# Patient Record
Sex: Male | Born: 1960
Health system: Southern US, Community
[De-identification: ages and names within clinical notes are randomized; demographics above are authoritative.]

## PROBLEM LIST (undated history)

## (undated) DIAGNOSIS — I10 Essential (primary) hypertension: Secondary | ICD-10-CM

## (undated) DIAGNOSIS — J449 Chronic obstructive pulmonary disease, unspecified: Secondary | ICD-10-CM

## (undated) DIAGNOSIS — K219 Gastro-esophageal reflux disease without esophagitis: Secondary | ICD-10-CM

## (undated) DIAGNOSIS — E785 Hyperlipidemia, unspecified: Secondary | ICD-10-CM

## (undated) HISTORY — DX: Hyperlipidemia, unspecified: E78.5

## (undated) HISTORY — DX: Essential (primary) hypertension: I10

## (undated) HISTORY — DX: Gastro-esophageal reflux disease without esophagitis: K21.9

## (undated) HISTORY — DX: Chronic obstructive pulmonary disease, unspecified: J44.9

## (undated) HISTORY — PX: POLYPECTOMY: SHX149

## (undated) HISTORY — PX: APPENDECTOMY: SHX54

---

## 1999-09-28 ENCOUNTER — Emergency Department (HOSPITAL_COMMUNITY): Admission: EM | Admit: 1999-09-28 | Discharge: 1999-09-28 | Payer: Self-pay | Admitting: Emergency Medicine

## 2010-05-24 ENCOUNTER — Encounter: Payer: Self-pay | Admitting: Orthopedic Surgery

## 2011-05-12 ENCOUNTER — Encounter: Payer: Self-pay | Admitting: Internal Medicine

## 2011-05-12 ENCOUNTER — Ambulatory Visit (INDEPENDENT_AMBULATORY_CARE_PROVIDER_SITE_OTHER): Payer: 59 | Admitting: Internal Medicine

## 2011-05-12 DIAGNOSIS — Z0000 Encounter for general adult medical examination without abnormal findings: Secondary | ICD-10-CM

## 2011-05-12 DIAGNOSIS — Z23 Encounter for immunization: Secondary | ICD-10-CM

## 2011-05-12 NOTE — Assessment & Plan Note (Addendum)
Td > 10 years, gave one today Declined flu shot, benefits discussed  Tobacco-- risk discussed, ?patch, meds?, knows that counseling is available. Also needs to see a dentist for oral cancer screening Strongly declined a DRE, benefits explained. Colon cancer screening discussed, colonoscopy versus iFOB; an iFOB  was provided. We'll call if interested in a colonoscopy RTC fast for labs  Noted to be slightly tachycardic, is quite nervous about "going to the doctor"

## 2011-05-12 NOTE — Patient Instructions (Signed)
Please come back fasting within 2 days for blood work only: CBC  CMP  FLP  TSH  PSA ---dx v70

## 2011-05-12 NOTE — Progress Notes (Signed)
  Subjective:    Patient ID: Richard Gates, male    DOB: 1960-09-11, 51 y.o.   MRN: [id number]  HPI New patient, needs a CPX Has not seen a doctor in years  Past medical history No active medical problems    Past surgical history Appendectomy as a child  Social history Married, children no Occupation-- Estate agent Tobacco-- "cutting down", used to be 1 ppd, now 2-3 cigarrets a day ETOH-- socially  Diet-- regular  Exercise -- active at work, basketball in the weekends    Family history Diabetes-- aunts? CAD-- no Stroke-- no Colon cancer-- no Breast cancer--sister  Prostate cancer--no   Review of Systems In general doing well, has occasionally right shoulder pain for the last 2 months, approximately twice a week, described as mild, decreased with certain positions. Also has noted that he sweats a lot when eating, no runny nose. Denies any fever chills or weight loss No abdominal pain, nausea, vomiting, diarrhea or blood in the stools. No dysuria or gross hematuria. No difficulty urinating. No chest pain, shortness of breath. Mild occasional cough with no hemoptysis.    Objective:   Physical Exam  Constitutional: He is oriented to person, place, and time. He appears well-developed and well-nourished. No distress.  HENT:  Head: Normocephalic and atraumatic.  Neck: No thyromegaly present.       Normal carotid pulse  Cardiovascular:       Slightly tachycardic, otherwise normal  Pulmonary/Chest: Effort normal. No respiratory distress. He has no wheezes. He has no rales.  Abdominal: Soft. Bowel sounds are normal. He exhibits no distension. There is no tenderness. There is no rebound and no guarding.  Genitourinary:       Strongly decline a DRE  Musculoskeletal: He exhibits no edema.       Shoulders range of motion normal  Lymphadenopathy:    He has no cervical adenopathy.  Neurological: He is alert and oriented to person, place, and time.  Skin: He is not  diaphoretic.  Psychiatric: He has a normal mood and affect. His behavior is normal. Judgment and thought content normal.       Assessment & Plan:

## 2011-05-13 ENCOUNTER — Encounter: Payer: Self-pay | Admitting: *Deleted

## 2011-05-13 ENCOUNTER — Encounter: Payer: Self-pay | Admitting: Internal Medicine

## 2011-05-16 ENCOUNTER — Other Ambulatory Visit: Payer: Self-pay | Admitting: Internal Medicine

## 2011-05-16 DIAGNOSIS — Z0000 Encounter for general adult medical examination without abnormal findings: Secondary | ICD-10-CM

## 2011-05-17 ENCOUNTER — Other Ambulatory Visit (INDEPENDENT_AMBULATORY_CARE_PROVIDER_SITE_OTHER): Payer: 59

## 2011-05-17 DIAGNOSIS — Z0000 Encounter for general adult medical examination without abnormal findings: Secondary | ICD-10-CM

## 2011-05-17 LAB — CBC WITH DIFFERENTIAL/PLATELET
Basophils Absolute: 0.1 10*3/uL (ref 0.0–0.1)
HCT: 42.1 % (ref 39.0–52.0)
Lymphs Abs: 3.1 10*3/uL (ref 0.7–4.0)
MCV: 96.9 fl (ref 78.0–100.0)
Monocytes Absolute: 0.9 10*3/uL (ref 0.1–1.0)
Neutrophils Relative %: 51.1 % (ref 43.0–77.0)
Platelets: 315 10*3/uL (ref 150.0–400.0)
RDW: 14.1 % (ref 11.5–14.6)
WBC: 8.7 10*3/uL (ref 4.5–10.5)

## 2011-05-17 LAB — LIPID PANEL
LDL Cholesterol: 91 mg/dL (ref 0–99)
Total CHOL/HDL Ratio: 3
VLDL: 17.4 mg/dL (ref 0.0–40.0)

## 2011-05-17 LAB — COMPREHENSIVE METABOLIC PANEL
ALT: 16 U/L (ref 0–53)
AST: 20 U/L (ref 0–37)
Albumin: 4.1 g/dL (ref 3.5–5.2)
Alkaline Phosphatase: 57 U/L (ref 39–117)
Potassium: 4.3 mEq/L (ref 3.5–5.1)
Sodium: 137 mEq/L (ref 135–145)
Total Bilirubin: 0.8 mg/dL (ref 0.3–1.2)
Total Protein: 7 g/dL (ref 6.0–8.3)

## 2011-05-17 LAB — TSH: TSH: 3.17 u[IU]/mL (ref 0.35–5.50)

## 2011-05-17 LAB — PSA: PSA: 0.5 ng/mL (ref 0.10–4.00)

## 2011-05-23 ENCOUNTER — Encounter: Payer: Self-pay | Admitting: Internal Medicine

## 2011-06-09 ENCOUNTER — Other Ambulatory Visit: Payer: 59

## 2011-06-09 DIAGNOSIS — Z1211 Encounter for screening for malignant neoplasm of colon: Secondary | ICD-10-CM

## 2011-06-09 LAB — FECAL OCCULT BLOOD, IMMUNOCHEMICAL: Fecal Occult Bld: NEGATIVE

## 2011-06-13 ENCOUNTER — Encounter: Payer: Self-pay | Admitting: *Deleted

## 2012-01-03 ENCOUNTER — Ambulatory Visit (INDEPENDENT_AMBULATORY_CARE_PROVIDER_SITE_OTHER): Payer: 59 | Admitting: Internal Medicine

## 2012-01-03 VITALS — BP 140/88 | HR 114 | Temp 98.5°F | Wt 140.0 lb

## 2012-01-03 DIAGNOSIS — R000 Tachycardia, unspecified: Secondary | ICD-10-CM

## 2012-01-03 DIAGNOSIS — H6120 Impacted cerumen, unspecified ear: Secondary | ICD-10-CM

## 2012-01-03 DIAGNOSIS — R05 Cough: Secondary | ICD-10-CM

## 2012-01-03 MED ORDER — HYDROCODONE-HOMATROPINE 5-1.5 MG/5ML PO SYRP: 5.0000 mL | ORAL_SOLUTION | Freq: Every evening | ORAL | Status: AC | PRN

## 2012-01-03 MED ORDER — ALBUTEROL SULFATE HFA 108 (90 BASE) MCG/ACT IN AERS: 2.0000 | INHALATION_SPRAY | Freq: Four times a day (QID) | RESPIRATORY_TRACT | Status: DC | PRN

## 2012-01-03 MED ORDER — DOXYCYCLINE HYCLATE 100 MG PO TABS: 100.0000 mg | ORAL_TABLET | Freq: Two times a day (BID) | ORAL | Status: AC

## 2012-01-03 MED ORDER — AZELASTINE HCL 0.1 % NA SOLN: 2.0000 | Freq: Two times a day (BID) | NASAL | Status: DC

## 2012-01-03 NOTE — Progress Notes (Signed)
  Subjective:    Patient ID: Richard Gates, male    DOB: 14-Jun-1960, 51 y.o.   MRN: [id number]  HPI Acute visit Cough on and off for the last 2 months, they're frequently produces green to yellow sputum, also cough is associated with chest congestion and sinus discharge.  Today, he is noted to be tachycardic for the second time.  Past medical history No active medical problems    Past surgical history Appendectomy as a child  Social history Married, children no Occupation-- Estate agent Tobacco-- "cutting down", used to be 1 ppd, now 2-3 cigarrets a day ETOH-- socially   Diet-- regular   Exercise -- active at work, basketball in the weekends     Family history Diabetes-- aunts? CAD-- no Stroke-- no Colon cancer-- no Breast cancer--sister   Prostate cancer--no   Review of Systems No actual fever chills, he has lost 3 pounds in the last few months. Denies chest pain or shortness of breath. No hemoptysis. He has noted some wheezing, denies any history of asthma, he still smokes. Denies sinus pain but he does have some sinus congestion, occasionally, left ear feels "plugged"    Objective:   Physical Exam  General -- alert, well-developed, and well-nourished.   Neck --  multiple, less than 1 cm LADs HEENT -- TM on the R normal; abundant compacted wax on the L, no d/c; normal, throat w/o redness, face symmetric and not tender to palpation, ++ nasal congestion  Lungs -- normal respiratory distress, few rhonchi bilaterally and large or regurgitation that appears to some extent with cough. Prolonged expiratory time but no actual wheezing Heart--  tachycardia, no murmur Extremities-- no pretibial edema bilaterally Psych-- Cognition and judgment appear intact. Alert and cooperative with normal attention span and concentration.  not anxious appearing and not depressed appearing.       Assessment & Plan:

## 2012-01-03 NOTE — Assessment & Plan Note (Addendum)
51 year old gentleman, smoker, 2 months history of cough and chest congestion. Some sinus congestion as well. DDX bronchitis, bronchospasm, pneumonia, sinusitis, others. Most likely he has bronchitis but further investigation is needed, will get a chest x-ray, treated with doxycycline. Albuterol when necessary. See instructions

## 2012-01-03 NOTE — Patient Instructions (Addendum)
Please get your x-ray at the other [place]  office located at: [address] [place], across from [facility].  Please go to the basement, this is a walk-in facility, they are open from 8:30 to 5:30 PM. Phone number [phone]. ---- Rest, fluids , tylenol For cough, take Mucinex DM twice a day as needed  If the cough continue, take hydrocodone at night For congestion use astelin nasal spray twice a day until you feel better If wheezing, use Ventolin  Take the antibiotic as prescribed  (doxycycline) Call if no better in few days Call anytime if the symptoms are severe Please come back for a checkup in 4 weeks ----- 5 peroxide drops in the left ear every day for 10 days

## 2012-01-03 NOTE — Assessment & Plan Note (Addendum)
Noted to be a slightly tachycardic today for the second time, EKG show rate of 100, sinus rhythm Labs from 05-2011 ----> TSH and CBC normal. Plan: reassess and return to the office

## 2012-01-03 NOTE — Assessment & Plan Note (Signed)
Has severe cerumen impactation on the left, partially removed with a spoon. See instructions

## 2012-01-04 ENCOUNTER — Ambulatory Visit (INDEPENDENT_AMBULATORY_CARE_PROVIDER_SITE_OTHER)
Admission: RE | Admit: 2012-01-04 | Discharge: 2012-01-04 | Disposition: A | Discharge status: Home / Self Care | Payer: 59 | Source: Ambulatory Visit | Attending: Internal Medicine | Admitting: Internal Medicine

## 2012-01-04 ENCOUNTER — Encounter: Payer: Self-pay | Admitting: Internal Medicine

## 2012-01-04 DIAGNOSIS — R05 Cough: Secondary | ICD-10-CM

## 2012-02-02 ENCOUNTER — Ambulatory Visit: Payer: 59 | Admitting: Internal Medicine

## 2012-02-07 ENCOUNTER — Ambulatory Visit (INDEPENDENT_AMBULATORY_CARE_PROVIDER_SITE_OTHER): Payer: 59 | Admitting: Internal Medicine

## 2012-02-07 VITALS — BP 144/82 | HR 94 | Temp 98.2°F | Wt 145.0 lb

## 2012-02-07 DIAGNOSIS — Z720 Tobacco use: Secondary | ICD-10-CM | POA: Insufficient documentation

## 2012-02-07 DIAGNOSIS — F419 Anxiety disorder, unspecified: Secondary | ICD-10-CM | POA: Insufficient documentation

## 2012-02-07 DIAGNOSIS — F329 Major depressive disorder, single episode, unspecified: Secondary | ICD-10-CM

## 2012-02-07 DIAGNOSIS — F17200 Nicotine dependence, unspecified, uncomplicated: Secondary | ICD-10-CM

## 2012-02-07 DIAGNOSIS — R05 Cough: Secondary | ICD-10-CM

## 2012-02-07 MED ORDER — BUPROPION HCL 100 MG PO TABS: 100.0000 mg | ORAL_TABLET | Freq: Two times a day (BID) | ORAL | Status: DC

## 2012-02-07 NOTE — Patient Instructions (Addendum)
Start Wellbutrin 100 mg one tablet a day for 10 days, then one tablet twice a day. In 2 weeks,  quit smoking, is okay to use the patch. Next office visit to see me in 10 weeks

## 2012-02-07 NOTE — Assessment & Plan Note (Signed)
Recent episodes of frequent cough resolved. On further questioning, he has on and off cough throughout the year, he used to be a heavy smoker, now smokes 2 cigarettes a day. He also feels wheezing from time to time. Symptoms could be related to asthma versus COPD. Plan: PFTs Continue Ventolin as needed Plans to get a flu shot at work

## 2012-02-07 NOTE — Assessment & Plan Note (Addendum)
Used to be a heavy smoker, now smokes 3 cigarettes a day, he is interested in quitting. We discussed different modalities to help him including Chantix, Wellbutrin, patches. He is interested in Wellbutrin mostly because he also feels slightly depressed. Plan: Start Wellbutrin, see instructions Okay to combine Wellbutrin with a patch. Info regards quitting provided

## 2012-02-07 NOTE — Progress Notes (Signed)
  Subjective:    Patient ID: Richard Gates, male    DOB: [birth year], 51 y.o.   MRN: [id number]  HPI Followup, was recently seen with a two-month history of persistent cough, chest x-ray was negative. Patient was treated and now is here for followup, persistent cough has essentially resolved.  On  further questioning, admits to cough on and off throughout the year, mostly in the morning, occasional sputum production. Also has noted occasional wheezing. He continue to smoke approximately 3 cigarettes a day. As I mentioned his options to quit, I mentioned Wellbutrin, an antidepressant. At this point he reported mild depression for the last couple of years, mostly related to changing his lifestyle, he use to smoke more and go drink with his friends which he is not doing now (since he got married). 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 CAD-- no Stroke-- no Colon cancer-- no Breast cancer--sister   Prostate cancer--no   Review of Systems     Objective:   Physical Exam General -- alert, well-developed, and well-nourished.    Lungs -- normal respiratory effort, no intercostal retractions, no accessory muscle use, and normal breath sounds.   Heart-- normal rate, regular rhythm, no murmur, and no gallop.   Neurologic-- alert & oriented X3 and strength normal in all extremities. Psych-- Cognition and judgment appear intact. Alert and cooperative with normal attention span and concentration.  not anxious appearing and not depressed appearing.       Assessment & Plan:  Today , I spent more than 25 min with the patient, >50% of  the time counseling

## 2012-02-07 NOTE — Assessment & Plan Note (Signed)
Today, he also reports mild depression, no suicidal, no anxiety.  See history of present illness, part of the problem is a change in lifestyle, I think the change is actually a good change, patient is counseled. The patient is starting Wellbutrin today mostly for the purpose of tobacco quitting  but  may also help with depression. Reassess in 10 weeks

## 2012-02-08 ENCOUNTER — Encounter: Payer: Self-pay | Admitting: Internal Medicine

## 2012-02-15 ENCOUNTER — Ambulatory Visit (INDEPENDENT_AMBULATORY_CARE_PROVIDER_SITE_OTHER): Payer: 59 | Admitting: Internal Medicine

## 2012-02-15 DIAGNOSIS — R05 Cough: Secondary | ICD-10-CM

## 2012-02-15 DIAGNOSIS — R059 Cough, unspecified: Secondary | ICD-10-CM

## 2012-02-15 LAB — PULMONARY FUNCTION TEST

## 2012-02-15 NOTE — Progress Notes (Signed)
PFT done today. 

## 2012-02-26 ENCOUNTER — Telehealth: Payer: Self-pay | Admitting: Internal Medicine

## 2012-02-26 NOTE — Telephone Encounter (Signed)
Advise patient, PFTs showed moderate obstruction and limited response to bronchodilators. Most likely has emphysema. In addition to quit tobacco I recommend Spiriva 1 a day, call 1 month a 6 Rf. Advise patient to read instructions carefully and if needed to schedule a nurse visit to learn how to use the device.

## 2012-02-29 MED ORDER — TIOTROPIUM BROMIDE MONOHYDRATE 18 MCG IN CAPS: 18.0000 ug | ORAL_CAPSULE | Freq: Every day | RESPIRATORY_TRACT | Status: DC

## 2012-02-29 NOTE — Telephone Encounter (Signed)
Discussed with pt, sent in rx.  

## 2012-04-27 ENCOUNTER — Encounter: Payer: 59 | Admitting: Internal Medicine

## 2012-05-12 ENCOUNTER — Other Ambulatory Visit: Payer: Self-pay | Admitting: Internal Medicine

## 2012-05-12 MED ORDER — OSELTAMIVIR PHOSPHATE 75 MG PO CAPS: 75.0000 mg | ORAL_CAPSULE | Freq: Every day | ORAL | Status: DC

## 2014-05-21 ENCOUNTER — Encounter: Payer: 59 | Admitting: Internal Medicine

## 2016-06-03 ENCOUNTER — Telehealth: Payer: Self-pay | Admitting: Internal Medicine

## 2016-06-03 NOTE — Telephone Encounter (Signed)
Pt called in because he said that he use to be a pt of Dr. Larose Kells. Pt says that he lost his insurance so he couldn't pay to be seen. Pt says that he now have his new insurance. Pt would like to know if he could be re-established with you?    Please advise.

## 2016-06-03 NOTE — Telephone Encounter (Signed)
please  schedule a visit at the patient's convenience

## 2016-06-07 NOTE — Telephone Encounter (Signed)
Pt has been scheduled.  °

## 2016-06-22 ENCOUNTER — Encounter: Payer: Self-pay | Admitting: Internal Medicine

## 2016-06-22 ENCOUNTER — Ambulatory Visit (INDEPENDENT_AMBULATORY_CARE_PROVIDER_SITE_OTHER): Payer: 59 | Admitting: Internal Medicine

## 2016-06-22 VITALS — BP 128/80 | HR 92 | Temp 97.8°F | Resp 14 | Ht 68.5 in | Wt 141.1 lb

## 2016-06-22 DIAGNOSIS — Z0000 Encounter for general adult medical examination without abnormal findings: Secondary | ICD-10-CM

## 2016-06-22 DIAGNOSIS — Z1211 Encounter for screening for malignant neoplasm of colon: Secondary | ICD-10-CM

## 2016-06-22 DIAGNOSIS — Z1159 Encounter for screening for other viral diseases: Secondary | ICD-10-CM

## 2016-06-22 DIAGNOSIS — Z114 Encounter for screening for human immunodeficiency virus [HIV]: Secondary | ICD-10-CM

## 2016-06-22 MED ORDER — ALBUTEROL SULFATE HFA 108 (90 BASE) MCG/ACT IN AERS: 2.0000 | INHALATION_SPRAY | Freq: Four times a day (QID) | RESPIRATORY_TRACT | 1 refills | Status: DC | PRN

## 2016-06-22 NOTE — Progress Notes (Signed)
Subjective:    Patient ID: Richard Gates, male    DOB: 03-Mar-1961, 56 y.o.   MRN: [id number]  DOS:  06/22/2016 Type of visit - description : new pt, cpx Interval history:Has few concerns, see ros  Review of Systems Patient smokes  pack a day, reports occasional cough with white sputum, occasional wheezing. Also reports year-round nasal congestion with postnasal dripping. For the last few days is having a cold, mild cough is slightly increased. No fever chills but wife had fever recently.  Constitutional: No fever. No chills. No unexplained wt changes. No unusual sweats  HEENT: No dental problems, no ear discharge, no facial swelling, no voice changes. No eye discharge, no eye  redness , no  intolerance to light   Respiratory:  no difficulty breathing.    Cardiovascular: No CP, no leg swelling , no  Palpitations  GI: no nausea, no vomiting, no diarrhea , no  abdominal pain.  No blood in the stools. No dysphagia, no odynophagia    Endocrine: No polyphagia, no polyuria , no polydipsia  GU: No dysuria, gross hematuria, difficulty urinating. No urinary urgency, no frequency.  Musculoskeletal: No joint swellings or unusual aches or pains  Skin: No change in the color of the skin, palor , no  Rash  Allergic, immunologic: see above Neurological: No dizziness no  syncope. No headaches. No diplopia, no slurred, no slurred speech, no motor deficits, no facial  Numbness  Hematological: No enlarged lymph nodes, no easy bruising , no unusual bleedings  Psychiatry: No suicidal ideas, no hallucinations, no beavior problems, no confusion.  No unusual/severe anxiety, no depression    Past Medical History:  Diagnosis Date  . GERD (gastroesophageal reflux disease)     Past Surgical History:  Procedure Laterality Date  . APPENDECTOMY      Social History   Social History  . Marital status: Married    Spouse name: N/A  . Number of children: 0  . Years of education: N/A    Occupational History  . [employer], order processing     Social History Main Topics  . Smoking status: Current Some Day Smoker  . Smokeless tobacco: Never Used     Comment: 1/4 ppd   . Alcohol use Yes     Comment: Social  . Drug use: No  . Sexual activity: Not on file   Other Topics Concern  . Not on file   Social History Narrative  . No narrative on file     Family History  Problem Relation Age of Onset  . Hypertension    . Heart attack Paternal Uncle   . Breast cancer Sister   . Colon cancer Neg Hx   . Prostate cancer Neg Hx      Allergies as of 06/22/2016   No Known Allergies     Medication List       Accurate as of 06/22/16 11:59 PM. Always use your most recent med list.          albuterol 108 (90 Base) MCG/ACT inhaler Commonly known as:  VENTOLIN HFA Inhale 2 puffs into the lungs every 6 (six) hours as needed for wheezing or shortness of breath.          Objective:   Physical Exam BP 128/80 (BP Location: Left Arm, Patient Position: Sitting, Cuff Size: Normal)   Pulse 92   Temp 97.8 F (36.6 C) (Oral)   Resp 14   Ht 5' 8.5" (1.74 m)   Wt  141 lb 2 oz (64 kg)   SpO2 97%   BMI 21.15 kg/m   General:   Well developed, well nourished . NAD.  Neck: No  thyromegaly  HEENT:  Normocephalic . Face symmetric, atraumatic Lungs:  Few rhonchi, clear with cough. Normal respiratory effort, no intercostal retractions, no accessory muscle use. Heart: RRR,  no murmur.  No pretibial edema bilaterally  Abdomen:  Not distended, soft, non-tender. No rebound or rigidity.   Skin: Exposed areas without rash. Not pale. Not jaundice Rectal:  External abnormalities: none. Normal sphincter tone. No rectal masses or tenderness.  Stool brown  Prostate: Prostate gland firm and smooth, left side slightly larger? No clear-cut nodularity or tenderness, mass or induration.  Neurologic:  alert & oriented X3.  Speech normal, gait appropriate for age and  unassisted Strength symmetric and appropriate for age.  Psych: Cognition and judgment appear intact.  Cooperative with normal attention span and concentration.  Behavior appropriate. No anxious or depressed appearing.    Assessment & Plan:   Assessment GERD Allergies , nasal  PLAN: GERD: Not an issue at this time Allergies: Year-round nasal congestion, recommend consistent use the Flonase Cough wheezing (occasional): patient is a smoker, no difficulty breathing. Recommend consistent use of Flonase, stop tobacco, albuterol as needed. Reassess in 3 months ,consider PFTs and medications such as Wellbutrin to help stop smoking (it helped before) RTC 3 months

## 2016-06-22 NOTE — Patient Instructions (Signed)
GO TO THE LAB : Get the blood work     GO TO THE FRONT DESK Schedule your next appointment for a  checkup in 3 months  For nasal congestion: Flonase, 2 sprays on each side of the nose every day  For cough: Mucinex DM 1 tablet twice a day as needed  If you hear wheezing: Use albuterol 2 puffs up to 3 times a day if needed  Think about quitting tobacco     Steps to Quit Smoking Smoking tobacco can be harmful to your health and can affect almost every organ in your body. Smoking puts you, and those around you, at risk for developing many serious chronic diseases. Quitting smoking is difficult, but it is one of the best things that you can do for your health. It is never too late to quit. What are the benefits of quitting smoking? When you quit smoking, you lower your risk of developing serious diseases and conditions, such as:  Lung cancer or lung disease, such as COPD.  Heart disease.  Stroke.  Heart attack.  Infertility.  Osteoporosis and bone fractures. Additionally, symptoms such as coughing, wheezing, and shortness of breath may get better when you quit. You may also find that you get sick less often because your body is stronger at fighting off colds and infections. If you are pregnant, quitting smoking can help to reduce your chances of having a baby of low birth weight. How do I get ready to quit? When you decide to quit smoking, create a plan to make sure that you are successful. Before you quit:  Pick a date to quit. Set a date within the next two weeks to give you time to prepare.  Write down the reasons why you are quitting. Keep this list in places where you will see it often, such as on your bathroom mirror or in your car or wallet.  Identify the people, places, things, and activities that make you want to smoke (triggers) and avoid them. Make sure to take these actions:  Throw away all cigarettes at home, at work, and in your car.  Throw away smoking  accessories, such as Scientist, research (medical).  Clean your car and make sure to empty the ashtray.  Clean your home, including curtains and carpets.  Tell your family, friends, and coworkers that you are quitting. Support from your loved ones can make quitting easier.  Talk with your health care provider about your options for quitting smoking.  Find out what treatment options are covered by your health insurance. What strategies can I use to quit smoking? Talk with your healthcare provider about different strategies to quit smoking. Some strategies include:  Quitting smoking altogether instead of gradually lessening how much you smoke over a period of time. Research shows that quitting "cold [place]" is more successful than gradually quitting.  Attending in-person counseling to help you build problem-solving skills. You are more likely to have success in quitting if you attend several counseling sessions. Even short sessions of 10 minutes can be effective.  Finding resources and support systems that can help you to quit smoking and remain smoke-free after you quit. These resources are most helpful when you use them often. They can include:  Online chats with a Social worker.  Telephone quitlines.  Printed Furniture conservator/restorer.  Support groups or group counseling.  Text messaging programs.  Mobile phone applications.  Taking medicines to help you quit smoking. (If you are pregnant or breastfeeding, talk with your health  care provider first.) Some medicines contain nicotine and some do not. Both types of medicines help with cravings, but the medicines that include nicotine help to relieve withdrawal symptoms. Your health care provider may recommend:  Nicotine patches, gum, or lozenges.  Nicotine inhalers or sprays.  Non-nicotine medicine that is taken by mouth. Talk with your health care provider about combining strategies, such as taking medicines while you are also receiving in-person  counseling. Using these two strategies together makes you more likely to succeed in quitting than if you used either strategy on its own. If you are pregnant or breastfeeding, talk with your health care provider about finding counseling or other support strategies to quit smoking. Do not take medicine to help you quit smoking unless told to do so by your health care provider. What things can I do to make it easier to quit? Quitting smoking might feel overwhelming at first, but there is a lot that you can do to make it easier. Take these important actions:  Reach out to your family and friends and ask that they support and encourage you during this time. Call telephone quitlines, reach out to support groups, or work with a counselor for support.  Ask people who smoke to avoid smoking around you.  Avoid places that trigger you to smoke, such as bars, parties, or smoke-break areas at work.  Spend time around people who do not smoke.  Lessen stress in your life, because stress can be a smoking trigger for some people. To lessen stress, try:  Exercising regularly.  Deep-breathing exercises.  Yoga.  Meditating.  Performing a body scan. This involves closing your eyes, scanning your body from head to toe, and noticing which parts of your body are particularly tense. Purposefully relax the muscles in those areas.  Download or purchase mobile phone or tablet apps (applications) that can help you stick to your quit plan by providing reminders, tips, and encouragement. There are many free apps, such as QuitGuide from the [employer] Office manager for Disease Control and Prevention). You can find other support for quitting smoking (smoking cessation) through smokefree.gov and other websites. How will I feel when I quit smoking? Within the first 24 hours of quitting smoking, you may start to feel some withdrawal symptoms. These symptoms are usually most noticeable 2-3 days after quitting, but they usually do not  last beyond 2-3 weeks. Changes or symptoms that you might experience include:  Mood swings.  Restlessness, anxiety, or irritation.  Difficulty concentrating.  Dizziness.  Strong cravings for sugary foods in addition to nicotine.  Mild weight gain.  Constipation.  Nausea.  Coughing or a sore throat.  Changes in how your medicines work in your body.  A depressed mood.  Difficulty sleeping (insomnia). After the first 2-3 weeks of quitting, you may start to notice more positive results, such as:  Improved sense of smell and taste.  Decreased coughing and sore throat.  Slower heart rate.  Lower blood pressure.  Clearer skin.  The ability to breathe more easily.  Fewer sick days. Quitting smoking is very challenging for most people. Do not get discouraged if you are not successful the first time. Some people need to make many attempts to quit before they achieve long-term success. Do your best to stick to your quit plan, and talk with your health care provider if you have any questions or concerns. This information is not intended to replace advice given to you by your health care provider. Make sure you discuss  any questions you have with your health care provider. Document Released: 04/12/2001 Document Revised: 12/15/2015 Document Reviewed: 09/02/2014 Elsevier Interactive Patient Education  2017 [employer].

## 2016-06-22 NOTE — Assessment & Plan Note (Signed)
Td 2013; declined flu shot  CCS- never cscope, request a screening, refer to GI Prostate ca screening:  DRE: Symmetric? See exam. Will check a PSA, low threshold to refer to urology  Tobacco--  counseled Labs: CMP, CBC, TSH, FLP, PSA, HIV, hep C. Diet and exercise discussed

## 2016-06-22 NOTE — Progress Notes (Signed)
Pre visit review using our clinic review tool, if applicable. No additional management support is needed unless otherwise documented below in the visit note. 

## 2016-06-23 ENCOUNTER — Encounter: Payer: Self-pay | Admitting: Internal Medicine

## 2016-06-23 DIAGNOSIS — Z09 Encounter for follow-up examination after completed treatment for conditions other than malignant neoplasm: Secondary | ICD-10-CM | POA: Insufficient documentation

## 2016-06-23 LAB — CBC WITH DIFFERENTIAL/PLATELET
Basophils Absolute: 0.1 10*3/uL (ref 0.0–0.1)
Basophils Relative: 1 % (ref 0.0–3.0)
Eosinophils Absolute: 0.2 10*3/uL (ref 0.0–0.7)
Eosinophils Relative: 3.7 % (ref 0.0–5.0)
HCT: 39 % (ref 39.0–52.0)
Hemoglobin: 13.2 g/dL (ref 13.0–17.0)
Lymphocytes Relative: 15.2 % (ref 12.0–46.0)
Lymphs Abs: 1 10*3/uL (ref 0.7–4.0)
MCHC: 33.7 g/dL (ref 30.0–36.0)
MCV: 95.8 fl (ref 78.0–100.0)
Monocytes Absolute: 0.5 10*3/uL (ref 0.1–1.0)
Monocytes Relative: 8.7 % (ref 3.0–12.0)
Neutro Abs: 4.5 10*3/uL (ref 1.4–7.7)
Neutrophils Relative %: 71.4 % (ref 43.0–77.0)
Platelets: 349 10*3/uL (ref 150.0–400.0)
RBC: 4.08 Mil/uL — ABNORMAL LOW (ref 4.22–5.81)
RDW: 14.1 % (ref 11.5–15.5)
WBC: 6.3 10*3/uL (ref 4.0–10.5)

## 2016-06-23 LAB — LIPID PANEL
Cholesterol: 221 mg/dL — ABNORMAL HIGH (ref 0–200)
HDL: 68.3 mg/dL (ref >39.00)
NONHDL: 153.15
Total CHOL/HDL Ratio: 3
Triglycerides: 259 mg/dL — ABNORMAL HIGH (ref 0.0–149.0)
VLDL: 51.8 mg/dL — AB (ref 0.0–40.0)

## 2016-06-23 LAB — TSH: TSH: 0.94 u[IU]/mL (ref 0.35–4.50)

## 2016-06-23 LAB — COMPREHENSIVE METABOLIC PANEL WITH GFR
ALT: 28 U/L (ref 0–53)
AST: 25 U/L (ref 0–37)
Albumin: 4.4 g/dL (ref 3.5–5.2)
Alkaline Phosphatase: 59 U/L (ref 39–117)
BUN: 15 mg/dL (ref 6–23)
CO2: 30 meq/L (ref 19–32)
Calcium: 9.5 mg/dL (ref 8.4–10.5)
Chloride: 106 meq/L (ref 96–112)
Creatinine, Ser: 0.74 mg/dL (ref 0.40–1.50)
GFR: 140.61 mL/min
Glucose, Bld: 101 mg/dL — ABNORMAL HIGH (ref 70–99)
Potassium: 5 meq/L (ref 3.5–5.1)
Sodium: 140 meq/L (ref 135–145)
Total Bilirubin: 0.3 mg/dL (ref 0.2–1.2)
Total Protein: 6.9 g/dL (ref 6.0–8.3)

## 2016-06-23 LAB — HIV ANTIBODY (ROUTINE TESTING W REFLEX): HIV: NONREACTIVE

## 2016-06-23 LAB — HEPATITIS C ANTIBODY: HCV AB: NEGATIVE

## 2016-06-23 LAB — LDL CHOLESTEROL, DIRECT: Direct LDL: 99 mg/dL

## 2016-06-23 LAB — PSA: PSA: 0.39 ng/mL (ref 0.10–4.00)

## 2016-06-23 NOTE — Assessment & Plan Note (Addendum)
GERD: Not an issue at this time Allergies: Year-round nasal congestion, recommend consistent use the Flonase Cough wheezing (occasional): patient is a smoker, no difficulty breathing. Recommend consistent use of Flonase, stop tobacco, albuterol as needed. Reassess in 3 months ,consider PFTs and medications such as Wellbutrin to help stop smoking  (it helped before) RTC 3 months

## 2016-07-04 ENCOUNTER — Ambulatory Visit (INDEPENDENT_AMBULATORY_CARE_PROVIDER_SITE_OTHER): Payer: 59 | Admitting: Internal Medicine

## 2016-07-04 ENCOUNTER — Encounter: Payer: Self-pay | Admitting: Internal Medicine

## 2016-07-04 ENCOUNTER — Ambulatory Visit (HOSPITAL_BASED_OUTPATIENT_CLINIC_OR_DEPARTMENT_OTHER)
Admission: RE | Admit: 2016-07-04 | Discharge: 2016-07-04 | Disposition: A | Discharge status: Home / Self Care | Payer: 59 | Source: Ambulatory Visit | Attending: Internal Medicine | Admitting: Internal Medicine

## 2016-07-04 ENCOUNTER — Telehealth: Payer: Self-pay | Admitting: Internal Medicine

## 2016-07-04 VITALS — BP 122/84 | HR 127 | Temp 98.2°F | Resp 18 | Ht 68.5 in | Wt 142.5 lb

## 2016-07-04 DIAGNOSIS — R05 Cough: Secondary | ICD-10-CM | POA: Insufficient documentation

## 2016-07-04 DIAGNOSIS — J9801 Acute bronchospasm: Secondary | ICD-10-CM

## 2016-07-04 DIAGNOSIS — R062 Wheezing: Secondary | ICD-10-CM | POA: Diagnosis present

## 2016-07-04 DIAGNOSIS — J449 Chronic obstructive pulmonary disease, unspecified: Secondary | ICD-10-CM | POA: Insufficient documentation

## 2016-07-04 DIAGNOSIS — J40 Bronchitis, not specified as acute or chronic: Secondary | ICD-10-CM | POA: Diagnosis not present

## 2016-07-04 MED ORDER — PREDNISONE 10 MG PO TABS: ORAL_TABLET | ORAL | 0 refills | Status: DC

## 2016-07-04 MED ORDER — ALBUTEROL SULFATE (2.5 MG/3ML) 0.083% IN NEBU
2.5000 mg | INHALATION_SOLUTION | Freq: Once | RESPIRATORY_TRACT | Status: AC
  Administered 2016-07-04: 2.5 mg via RESPIRATORY_TRACT

## 2016-07-04 MED ORDER — ALBUTEROL SULFATE 1.25 MG/3ML IN NEBU: 1.0000 | INHALATION_SOLUTION | Freq: Four times a day (QID) | RESPIRATORY_TRACT | 1 refills | Status: DC | PRN

## 2016-07-04 MED ORDER — AZITHROMYCIN 250 MG PO TABS: ORAL_TABLET | ORAL | 0 refills | Status: DC

## 2016-07-04 NOTE — Telephone Encounter (Signed)
Error

## 2016-07-04 NOTE — Progress Notes (Signed)
Subjective:    Patient ID: Richard Gates, male    DOB: Nov 16, 1960, 56 y.o.   MRN: [id number]  DOS:  07/04/2016 Type of visit - description : acute Interval history: Was seen 06-22-16, had a URI, was recommended Flonase, was doing okay until 3 days ago , developed  severe cough, wheezing, green sputum production. Has been using albuterol inhaler as needed, did not have any today   Review of Systems Denies any fever but he does have difficulty breathing. + Chest pain anteriorly with cough No nausea or vomiting + Sinus congestion, runny nose but no sinus pain.  Past Medical History:  Diagnosis Date  . GERD (gastroesophageal reflux disease)     Past Surgical History:  Procedure Laterality Date  . APPENDECTOMY      Social History   Social History  . Marital status: Married    Spouse name: N/A  . Number of children: 0  . Years of education: N/A   Occupational History  . [employer], order processing     Social History Main Topics  . Smoking status: Current Some Day Smoker  . Smokeless tobacco: Never Used     Comment: 1/4 ppd   . Alcohol use Yes     Comment: Social  . Drug use: No  . Sexual activity: Not on file   Other Topics Concern  . Not on file   Social History Narrative  . No narrative on file      Allergies as of 07/04/2016   No Known Allergies     Medication List       Accurate as of 07/04/16 11:59 PM. Always use your most recent med list.          albuterol 108 (90 Base) MCG/ACT inhaler Commonly known as:  VENTOLIN HFA Inhale 2 puffs into the lungs every 6 (six) hours as needed for wheezing or shortness of breath.   albuterol 1.25 MG/3ML nebulizer solution Commonly known as:  ACCUNEB Take 3 mLs (1.25 mg total) by nebulization every 6 (six) hours as needed for wheezing.   azithromycin 250 MG tablet Commonly known as:  ZITHROMAX Z-PAK 2 tabs a day the first day, then 1 tab a day x 4 days   predniSONE 10 MG tablet Commonly known as:   DELTASONE 5 tablets x 2 days, 4 tablets x 2 days, 3 tabs x 2 days, 2 tabs x 2 days, 1 tab x 2 days          Objective:   Physical Exam BP 122/84 (BP Location: Left Arm, Patient Position: Sitting, Cuff Size: Normal)   Pulse (!) 127   Temp 98.2 F (36.8 C) (Oral)   Resp 18   Ht 5' 8.5" (1.74 m)   Wt 142 lb 8 oz (64.6 kg)   SpO2 93%   BMI 21.35 kg/m  General:   Well developed, well nourished, audible wheezing but in no distress. Speaking in complete sentences.  HEENT:  Normocephalic . Face symmetric, atraumatic. Right: Bulge but no red, left side obscured by wax Nose congested, sinuses no TTP. Throat symmetric no red Lungs:  Abundant rhonchi and wheezing bilaterally, no increased work of breathing or crackles. no intercostal retractions, no accessory muscle use. Heart: Sinus tachycardia,  no murmur.  No pretibial edema bilaterally  Skin: Not pale. Not jaundice Neurologic:  alert & oriented X3.  Speech normal, gait appropriate for age and unassisted Psych--  Cognition and judgment appear intact.  Cooperative with normal attention span and  concentration.  Behavior appropriate. No anxious or depressed appearing.      Assessment & Plan:   Assessment GERD Allergies , nasal  Reactive airway disease Smoker  PLAN: Bronchitis, b-spasm: Patient presents with severe bronchospasm, cough and wheezing. He got an albuterol nebulization, wheezing persisted, less rhonchi,pulse decreased to around 100, subjectively he felt slightly better. Plan: Chest x-ray to rule out pneumonia,Zithromax, prednisone taper, albuterol nebulizations (wife has a machine) or albuterol inhalers as needed, definitely stop smoking, ER or call if symptoms severe, RTC 2 weeks.

## 2016-07-04 NOTE — Patient Instructions (Addendum)
Get a follow-up in 2 weeks from today  Get your x-rays downstairs  =====  Rest, fluids , tylenol  For cough:  Take Mucinex DM twice a day as needed until better  For wheezing: Use the nebulizers every 6 hours as needed if severe symptoms For mild wheezing use the puffer   For nasal congestion: Use OTC  Flonase : 2 nasal sprays on each side of the nose in the morning until you feel better  Avoid decongestants such as  Pseudoephedrine or phenylephrine    Take the antibiotic as prescribed  (Zithromax)  Also take  prednisone, followed instructions  Call if not gradually better over the next 2- 3 days  Call anytime if the symptoms are severe, or go to the ER

## 2016-07-04 NOTE — Progress Notes (Signed)
Pre visit review using our clinic review tool, if applicable. No additional management support is needed unless otherwise documented below in the visit note. 

## 2016-07-05 NOTE — Assessment & Plan Note (Signed)
Bronchitis, b-spasm: Patient presents with severe bronchospasm, cough and wheezing. He got an albuterol nebulization, wheezing persisted, less rhonchi,pulse decreased to around 100, subjectively he felt slightly better. Plan: Chest x-ray to rule out pneumonia,Zithromax, prednisone taper, albuterol nebulizations (wife has a machine) or albuterol inhalers as needed, definitely stop smoking, ER or call if symptoms severe, RTC 2 weeks.

## 2016-07-08 ENCOUNTER — Telehealth: Payer: Self-pay

## 2016-07-08 NOTE — Telephone Encounter (Signed)
LMOM informing Pt to return call.  

## 2016-07-08 NOTE — Telephone Encounter (Signed)
Please check on him tomorrow Friday, had severe asthma, is he better?  Received: 3 days ago  Crosby, MD  Damita [place], [place]  Cc: Colon Branch, MD

## 2016-07-18 ENCOUNTER — Ambulatory Visit (HOSPITAL_BASED_OUTPATIENT_CLINIC_OR_DEPARTMENT_OTHER)
Admission: RE | Admit: 2016-07-18 | Discharge: 2016-07-18 | Disposition: A | Discharge status: Home / Self Care | Payer: 59 | Source: Ambulatory Visit | Attending: Internal Medicine | Admitting: Internal Medicine

## 2016-07-18 ENCOUNTER — Encounter: Payer: Self-pay | Admitting: Internal Medicine

## 2016-07-18 ENCOUNTER — Ambulatory Visit (INDEPENDENT_AMBULATORY_CARE_PROVIDER_SITE_OTHER): Payer: 59 | Admitting: Internal Medicine

## 2016-07-18 VITALS — BP 132/78 | HR 114 | Temp 98.0°F | Resp 14 | Ht 69.0 in | Wt 148.1 lb

## 2016-07-18 DIAGNOSIS — J4541 Moderate persistent asthma with (acute) exacerbation: Secondary | ICD-10-CM

## 2016-07-18 DIAGNOSIS — J40 Bronchitis, not specified as acute or chronic: Secondary | ICD-10-CM

## 2016-07-18 DIAGNOSIS — J9801 Acute bronchospasm: Secondary | ICD-10-CM | POA: Diagnosis not present

## 2016-07-18 MED ORDER — IPRATROPIUM-ALBUTEROL 0.5-2.5 (3) MG/3ML IN SOLN: 3.0000 mL | RESPIRATORY_TRACT | 5 refills | Status: DC | PRN

## 2016-07-18 MED ORDER — PREDNISONE 10 MG PO TABS: ORAL_TABLET | ORAL | 0 refills | Status: DC

## 2016-07-18 MED ORDER — IPRATROPIUM-ALBUTEROL 0.5-2.5 (3) MG/3ML IN SOLN
3.0000 mL | Freq: Once | RESPIRATORY_TRACT | Status: AC
  Administered 2016-07-18: 3 mL via RESPIRATORY_TRACT

## 2016-07-18 NOTE — Progress Notes (Signed)
Pre visit review using our clinic review tool, if applicable. No additional management support is needed unless otherwise documented below in the visit note. 

## 2016-07-18 NOTE — Assessment & Plan Note (Signed)
Bronchitis, b-spasm: Patient presents with persistent bronchospasm, he is slightly tachycardic and he had a supraclavicular retraction. Got a DuoNeb here at the office, his is subjectively better, on exam, the rhonchi has decrease and a supraclavicular retraction is gone. I think he responded better to DuoNeb than albuterol. PLAN: We'll do a chest x-ray, viral pneumonia?. Second round of prednisone. Refer to pulmonary for this week, to see Dr Elsworth Soho tomorrow. ER if symptoms increase. He is avoiding tobacco (has smoke 1-2 cigarettes in 2 weeks) , praised  Also, states he's not sleeping well, recommend Tylenol PM for now, symptoms likely related to acute process.Marland Kitchen

## 2016-07-18 NOTE — Patient Instructions (Addendum)
Go to the first floor to get a x-ray  Take a second round of prednisone  We are changing your nebulizer from albuterol to DuoNeb, you can use a neb up to  every 4 hours.  If you are not at home, okay to use albuterol  We are referring you to a specialist within the next few days  Go to the ER if severe difficulty breathing, chest pain, getting gradually worse.

## 2016-07-18 NOTE — Progress Notes (Signed)
Subjective:    Patient ID: Richard Gates, male    DOB: 30-Dec-1960, 56 y.o.   MRN: [id number]  DOS:  07/18/2016 Type of visit - description : Follow-up Interval history: Patient was seen 07/04/2016 with bronchospasm, chest x-ray showed no pneumonia, he took prednisone and Zithromax. He had a very mild improvement after the high doses of prednisone . Continue with cough, wheezing. Sputum is usually white and foamy, occasionally green. For the last 3 days he has gotten slightly worse, having DOE, has been unable to work much.  Wt Readings from Last 3 Encounters:  07/18/16 148 lb 2 oz (67.2 kg)  07/04/16 142 lb 8 oz (64.6 kg)  06/22/16 141 lb 2 oz (64 kg)    Review of Systems  Denies fever chills Minimal sinus congestion and clear nasal discharge No nausea or vomiting. Did have diarrhea once No GERD symptoms  Past Medical History:  Diagnosis Date  . GERD (gastroesophageal reflux disease)     Past Surgical History:  Procedure Laterality Date  . APPENDECTOMY      Social History   Social History  . Marital status: Married    Spouse name: N/A  . Number of children: 0  . Years of education: N/A   Occupational History  . [employer], order processing     Social History Main Topics  . Smoking status: Current Some Day Smoker  . Smokeless tobacco: Never Used     Comment: 1/4 ppd   . Alcohol use Yes     Comment: Social  . Drug use: No  . Sexual activity: Not on file   Other Topics Concern  . Not on file   Social History Narrative  . No narrative on file      Allergies as of 07/18/2016   No Known Allergies     Medication List       Accurate as of 07/18/16  7:32 PM. Always use your most recent med list.          albuterol 108 (90 Base) MCG/ACT inhaler Commonly known as:  VENTOLIN HFA Inhale 2 puffs into the lungs every 6 (six) hours as needed for wheezing or shortness of breath.   ipratropium-albuterol 0.5-2.5 (3) MG/3ML Soln Commonly known as:   DUONEB Take 3 mLs by nebulization every 4 (four) hours as needed.   predniSONE 10 MG tablet Commonly known as:  DELTASONE 5 tablets x 2 days, 4 tablets x 2 days, 3 tabs x 2 days, 2 tabs x 2 days, 1 tab x 2 days          Objective:   Physical Exam BP 132/78 (BP Location: Left Arm, Patient Position: Sitting, Cuff Size: Small)   Pulse (!) 114   Temp 98 F (36.7 C) (Oral)   Resp 14   Ht 5\' 9"  (1.753 m)   Wt 148 lb 2 oz (67.2 kg)   SpO2 97%   BMI 21.87 kg/m  General:   Well developed, well nourished . NAD.  HEENT:  Normocephalic . Face symmetric, atraumatic Neck: No JVD Lungs:  ++ rhonchi and wheezes bilaterally, no crackles. Normal respiratory effort, no intercostal retractions, mild supraclavicular retractions.. Heart: tachycardic,  no murmur.  No pretibial edema bilaterally  Skin: Not pale. Not jaundice Neurologic:  alert & oriented X3.  Speech normal, gait appropriate for age and unassisted Psych--  Cognition and judgment appear intact.  Cooperative with normal attention span and concentration.  Behavior appropriate. No anxious or depressed appearing.  Assessment & Plan:  Assessment GERD Allergies , nasal  Reactive airway disease Smoker  PLAN: Bronchitis, b-spasm: Patient presents with persistent bronchospasm, he is slightly tachycardic and he had a supraclavicular retraction. Got a DuoNeb here at the office, his is subjectively better, on exam, the rhonchi has decrease and a supraclavicular retraction is gone. I think he responded better to DuoNeb than albuterol. PLAN: We'll do a chest x-ray, viral pneumonia?. Second round of prednisone. Refer to pulmonary for this week, to see Dr Elsworth Soho tomorrow. ER if symptoms increase. He is avoiding tobacco (has smoke 1-2 cigarettes in 2 weeks) , praised  Also, states he's not sleeping well, recommend Tylenol PM for now, symptoms likely related to acute process.Marland Kitchen

## 2016-07-19 ENCOUNTER — Ambulatory Visit (INDEPENDENT_AMBULATORY_CARE_PROVIDER_SITE_OTHER): Payer: 59 | Admitting: Pulmonary Disease

## 2016-07-19 ENCOUNTER — Encounter: Payer: Self-pay | Admitting: Pulmonary Disease

## 2016-07-19 ENCOUNTER — Institutional Professional Consult (permissible substitution): Payer: 59 | Admitting: Pulmonary Disease

## 2016-07-19 DIAGNOSIS — Z720 Tobacco use: Secondary | ICD-10-CM | POA: Diagnosis not present

## 2016-07-19 DIAGNOSIS — J441 Chronic obstructive pulmonary disease with (acute) exacerbation: Secondary | ICD-10-CM

## 2016-07-19 DIAGNOSIS — J449 Chronic obstructive pulmonary disease, unspecified: Secondary | ICD-10-CM | POA: Insufficient documentation

## 2016-07-19 MED ORDER — UMECLIDINIUM-VILANTEROL 62.5-25 MCG/INH IN AEPB: 1.0000 | INHALATION_SPRAY | Freq: Every day | RESPIRATORY_TRACT | 0 refills | Status: DC

## 2016-07-19 MED ORDER — BUPROPION HCL ER (SR) 150 MG PO TB12: 150.0000 mg | ORAL_TABLET | Freq: Every day | ORAL | 3 refills | Status: DC

## 2016-07-19 NOTE — Progress Notes (Signed)
Subjective:    Patient ID: Richard Gates, male    DOB: [birth year], 56 y.o.   MRN: [id number]  HPI  56 year old smoker referred for cough and dyspnea. His wife was sick with a cold sweat after an endoscopy and he caught it from her about 3 weeks ago. He initially reported green sputum production and was given a Z-Pak, course of prednisone and albuterol MDI for cough and wheezing. He was not improved in 2 weeks and had another visit 3/19 and was given another round of prednisone and DuoNeb's as prescribed. Nebulizer has helped and he feels slightly improved today but continues to have minimal green sputum. Denies fevers, reports intermittent wheezing  I have reviewed his chest x-ray images from 3/5 in 3/19 which was hyperinflation without clear infiltrate or effusions He denies dyspnea at baseline. He smoked about 30-pack-years, at the most about a pack per day and has now cut down to a pack every 4 days. 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  PFTs 01/2012 showed ratio of 58, FEV1 of 1.78- 52% without significant bronchodilator response, TLC was 77% with DLCO 78%   Past Medical History:  Diagnosis Date  . GERD (gastroesophageal reflux disease)       Past Surgical History:  Procedure Laterality Date  . APPENDECTOMY      No Known Allergies  Social History   Social History  . Marital status: Married    Spouse name: N/A  . Number of children: 0  . Years of education: N/A   Occupational History  . [employer], order processing     Social History Main Topics  . Smoking status: Current Some Day Smoker  . Smokeless tobacco: Never Used     Comment: 1/4 ppd   . Alcohol use Yes     Comment: Social  . Drug use: No  . Sexual activity: Not on file   Other Topics Concern  . Not on file   Social History Narrative  . No narrative on file     Family History  Problem Relation Age of Onset  . Hypertension    . Heart  attack Paternal Uncle   . Breast cancer Sister   . Colon cancer Neg Hx   . Prostate cancer Neg Hx     Review of Systems  Constitutional: Negative for fever and unexpected weight change.  HENT: Negative for congestion, dental problem, ear pain, nosebleeds, postnasal drip, rhinorrhea, sinus pressure, sneezing, sore throat and trouble swallowing.   Eyes: Negative for redness and itching.  Respiratory: Positive for cough and shortness of breath. Negative for chest tightness and wheezing.   Cardiovascular: Negative for palpitations and leg swelling.  Gastrointestinal: Negative for nausea and vomiting.  Genitourinary: Negative for dysuria.  Musculoskeletal: Negative for joint swelling.  Skin: Negative for rash.  Neurological: Negative for headaches.  Hematological: Does not bruise/bleed easily.  Psychiatric/Behavioral: Negative for dysphoric mood. The patient is not nervous/anxious.        Objective:   Physical Exam  Gen. Pleasant, well-nourished, in no distress, normal affect ENT - no lesions, no post nasal drip Neck: No JVD, no thyromegaly, no carotid bruits Lungs: no use of accessory muscles, no dullness to percussion, bilateral scattered rhonchi  Cardiovascular: Rhythm regular, heart sounds  normal, no murmurs or gallops, no peripheral edema Abdomen: soft and non-tender, no hepatosplenomegaly, BS normal. Musculoskeletal: No deformities, no cyanosis or clubbing Neuro:  alert, non focal  Assessment & Plan:

## 2016-07-19 NOTE — Progress Notes (Signed)
Patient seen in the office today and instructed on use of Anoro.  Patient expressed understanding and demonstrated technique. Richard Gates [facility] 07/19/16

## 2016-07-19 NOTE — Patient Instructions (Signed)
You have stage II COPD Lung capacity in 2013 was 52%  Complete course of prednisone If green phlegm persists, call us back for another course of antibiotic  Trial of ANORO once daily-call us back for prescription if this works  You have to quit smoking! Prescription for Wellbutrin XL 150 mg daily for one month 3 refills

## 2016-07-19 NOTE — Assessment & Plan Note (Signed)
Smoking cessation was discussed as the main intervention Prescription for Wellbutrin XL 150 mg daily for one month 3 refills  Nicotine patch 14 mg per day for breakthrough

## 2016-07-19 NOTE — Assessment & Plan Note (Addendum)
stage II COPD Lung capacity in 2013 was 52%  Complete course of prednisone If green phlegm persists, call us back for another course of antibiotic  Trial of ANORO once daily-call us back for prescription if this works. He will return in 3 weeks and we will go over his maintenance medications again and schedule a spirometry in a few months to estimate his new baseline  We discussed natural course of COPD and nature of exacerbations

## 2016-07-25 ENCOUNTER — Telehealth: Payer: Self-pay | Admitting: Pulmonary Disease

## 2016-07-25 MED ORDER — UMECLIDINIUM-VILANTEROL 62.5-25 MCG/INH IN AEPB: 1.0000 | INHALATION_SPRAY | Freq: Every day | RESPIRATORY_TRACT | 6 refills | Status: DC

## 2016-07-25 NOTE — Telephone Encounter (Signed)
lmtcb X1 for pt  

## 2016-07-25 NOTE — Telephone Encounter (Signed)
Patient requesting a Rx for Anoro be sent to [facility] in [place], [place] Rx sent to requested pharmacy. Nothing further needed.  Patient Instructions   You have stage II COPD Lung capacity in 2013 was 52%  Complete course of prednisone If Richard Gates phlegm persists, call us back for another course of antibiotic  Trial of ANORO once daily-call us back for prescription if this works  You have to quit smoking! Prescription for Wellbutrin XL 150 mg daily for one month 3 refills

## 2016-07-25 NOTE — Telephone Encounter (Signed)
Patient returning call -he can be reached at [phone] -pr

## 2016-08-08 ENCOUNTER — Encounter: Payer: Self-pay | Admitting: *Deleted

## 2016-08-08 ENCOUNTER — Telehealth: Payer: Self-pay | Admitting: Pulmonary Disease

## 2016-08-08 ENCOUNTER — Encounter: Payer: Self-pay | Admitting: Internal Medicine

## 2016-08-08 ENCOUNTER — Ambulatory Visit: Payer: 59 | Admitting: Acute Care

## 2016-08-08 ENCOUNTER — Ambulatory Visit (INDEPENDENT_AMBULATORY_CARE_PROVIDER_SITE_OTHER): Payer: 59 | Admitting: Internal Medicine

## 2016-08-08 VITALS — BP 138/86 | HR 111 | Temp 98.4°F | Ht 69.0 in | Wt 146.0 lb

## 2016-08-08 DIAGNOSIS — J441 Chronic obstructive pulmonary disease with (acute) exacerbation: Secondary | ICD-10-CM

## 2016-08-08 MED ORDER — BUDESONIDE-FORMOTEROL FUMARATE 160-4.5 MCG/ACT IN AERO: 2.0000 | INHALATION_SPRAY | Freq: Two times a day (BID) | RESPIRATORY_TRACT | 11 refills | Status: DC

## 2016-08-08 MED ORDER — PREDNISONE 10 MG PO TABS: ORAL_TABLET | ORAL | 0 refills | Status: DC

## 2016-08-08 MED ORDER — BUDESONIDE-FORMOTEROL FUMARATE 160-4.5 MCG/ACT IN AERO: 2.0000 | INHALATION_SPRAY | Freq: Two times a day (BID) | RESPIRATORY_TRACT | 0 refills | Status: DC

## 2016-08-08 NOTE — Patient Instructions (Signed)
Plan A = Automatic = Symbicort 160 Take 2 puffs first thing in am and then another 2 puffs about 12 hours later.   Prednisone 10 mg take  4 each am x 2 days,   2 each am x 2 days,  1 each am x 2 days and stop    Work on inhaler technique:  relax and gently blow all the way out then take a nice smooth deep breath back in, triggering the inhaler at same time you start breathing in.  Hold for up to 5 seconds if you can. Blow out thru nose. Rinse and gargle with water when done     Plan B = Backup Only use your albuterol (VENTOLIN)  as a rescue medication to be used if you can't catch your breath by resting or doing a relaxed purse lip breathing pattern.  - The less you use it, the better it will work when you need it. - Ok to use the inhaler up to 2 puffs  every 4 hours if you must but call for appointment if use goes up over your usual need - Don't leave home without it !!  (think of it like the spare tire for your car)   Plan C = Crisis - only use your albuterol nebulizer if you first try Plan B and it fails to help > ok to use the nebulizer up to every 4 hours but if start needing it regularly call for immediate appointment    Please schedule a follow up office visit in  2 weeks, sooner if needed to see Tammy with all your medications

## 2016-08-08 NOTE — Telephone Encounter (Signed)
Pt having increased SOB, requesting appt today.  Some chest tightness. Denies pain.  Pt states that he has been using the Anoro and he is having a hard time seeing a difference with activity, still SOB.  Pt states that he is very active at work.  Pt has an appt on Thursday this week with Dr Elsworth Soho but does not feel that he can wait that long.  Aware that I will see if he is able to see TP this morning -- pt requests an earlier than later appt today if possible.   Please advice TP if able to work in this morning. Thanks.

## 2016-08-08 NOTE — Telephone Encounter (Signed)
Pt aware that we can see him today at 11:30 with Eric Form >>> This was discussed with Maryann Conners. Nothing further needed.

## 2016-08-08 NOTE — Progress Notes (Signed)
Subjective:    Patient ID: Richard Gates, male    DOB: July 01, 1960 .   MRN: [id number]    Brief patient profile:  56 year old  bm quit smoking 07/18/16 eferred for cough and dyspnea by Dr Richard Gates with evidence of GOLD II copd 2013  PFTs 01/2012 showed ratio of 58, FEV1 of 1.78- 52% without significant bronchodilator response, TLC was 77% with DLCO 78%     History of Present Illness  Richard Gates 07/19/16  His wife was sick with a cold sweat after an endoscopy and he caught it from her about 3 weeks ago. He initially reported green sputum production and was given a Z-Pak, course of prednisone and albuterol MDI for cough and wheezing. He was not improved in 2 weeks and had another visit 3/19 and was given another round of prednisone and DuoNeb's as prescribed. Nebulizer has helped and he feels slightly improved today but continues to have minimal green sputum. Denies fevers, reports intermittent wheezing rec You have stage II COPD Lung capacity in 2013 was 52% Complete course of prednisone If green phlegm persists, call us back for another course of antibiotic Trial of ANORO once daily-call us back for prescription if this works    08/08/2016 acute extended ov/Richard Gates re: aecopd maint rx anoro one each am/ lots of ventolin use/ also has neb but not using yet  Chief Complaint  Patient presents with  . Acute Visit    Pt c/o increased SOB for the past 3 days. He gets out of breath just walking to his mailbox. He also c/o increased cough, chest tightness and wheezing. His cough has been prod with yellow to foamy white sputum.    really not doing well since early Feb 2018  and only transiently improved p zpak/ pred x 2 courses   No obvious day to day or daytime variability or assoc excess/ purulent sputum or mucus plugs or hemoptysis or cp or overt sinus or hb symptoms. No unusual exp hx or h/o childhood pna/ asthma or knowledge of premature birth.  Sleeping ok without nocturnal  or early am  exacerbation  of respiratory  c/o's or need for noct saba. Also denies any obvious fluctuation of symptoms with weather or environmental changes or other aggravating or alleviating factors except as outlined above   Current Medications, Allergies, Complete Past Medical History, Past Surgical History, Family History, and Social History were reviewed in [employer] record.  ROS  The following are not active complaints unless bolded sore throat, dysphagia, dental problems, itching, sneezing,  nasal congestion or excess/ purulent secretions, ear ache,   fever, chills, sweats, unintended wt loss, classically pleuritic or exertional cp,  orthopnea pnd or leg swelling, presyncope, palpitations, abdominal pain, anorexia, nausea, vomiting, diarrhea  or change in bowel or bladder habits, change in stools or urine, dysuria,hematuria,  rash, arthralgias, visual complaints, headache, numbness, weakness or ataxia or problems with walking or coordination,  change in mood/affect or memory.             Objective:   Physical Exam   amb bm  nad last used saba 1 h prior to OV     Wt Readings from Last 3 Encounters:  08/08/16 146 lb (66.2 kg)  07/19/16 142 lb 6.4 oz (64.6 kg)  07/18/16 148 lb 2 oz (67.2 kg)    Vital signs reviewed  - Note on arrival 02 sats  91% on RA       HEENT: nl  turbinates bilaterally, and oropharynx. Nl external ear canals without cough reflex  - upper dentures    NECK :  without JVD/Nodes/TM/ nl carotid upstrokes bilaterally   LUNGS: no acc muscle use,  Nl contour chest  With mid exp rhonchi/ wheezing bilaterally    CV:  RRR  no s3 or murmur or increase in P2, and no edema   ABD:  soft and nontender with nl inspiratory excursion in the supine position. No bruits or organomegaly appreciated, bowel sounds nl  MS:  Nl gait/ ext warm no deformities/ no calf tenderness, cyanosis or clubbing   SKIN: warm and dry without lesions    NEURO:  alert,  approp, nl sensorium with  no motor or cerebellar deficits apparent.    .   I personally reviewed images and agree with radiology impression as follows:  CXR:   07/18/16 No acute cardiopulmonary process seen.          Assessment & Plan:

## 2016-08-09 NOTE — Assessment & Plan Note (Signed)
DDX of  difficult airways management almost all start with A and  include Adherence, Ace Inhibitors, Acid Reflux, Active Sinus Disease, Alpha 1 Antitripsin deficiency, Anxiety masquerading as Airways dz,  ABPA,  Allergy(esp in young), Aspiration (esp in elderly), Adverse effects of meds,  Active smokers, A bunch of PE's (a small clot burden can't cause this syndrome unless there is already severe underlying pulm or vascular dz with poor reserve) plus two Bs  = Bronchiectasis and Beta blocker use..and one C= CHF   Adherence is always the initial "prime suspect" and is a multilayered concern that requires a "trust but verify" approach in every patient - starting with knowing how to use medications, especially inhalers, correctly, keeping up with refills and understanding the fundamental difference between maintenance and prns vs those medications only taken for a very short course and then stopped and not refilled.  - The proper method of use, as well as anticipated side effects, of a metered-dose inhaler are discussed and demonstrated to the patient. Improved effectiveness after extensive coaching during this visit to a level of approximately 75 % from a baseline of 50 % > try symb 160 2bid - rec return with all meds in hand using a trust but verify approach to confirm accurate Medication  Reconciliation The principal here is that until we are certain that the  patients are doing what we've asked, it makes no sense to ask them to do more.   ? Allergy > Prednisone 10 mg take  4 each am x 2 days,   2 each am x 2 days,  1 each am x 2 days and stop plus add ics for apparent pred responsive component to airways instability   ? Adverse effect of dpi > try off anoro  ?  Active smoking > denies/ encouraged maintain abstinence  ? Alpha one AT def > very rare in AA population but consider check x one for the record at next ov    I had an extended discussion with the patient reviewing all relevant studies  completed to date and  lasting 25 minutes of a 40  minute acute  visit for pt not previously know to me with severe  non-specific but potentially very serious refractory respiratory symptoms of unknown etiology.  Each maintenance medication was reviewed in detail including most importantly the difference between maintenance and prns and under what circumstances the prns are to be triggered using an action plan format that is not reflected in the computer generated alphabetically organized AVS.    Please see AVS for specific instructions unique to this office visit that I personally wrote and verbalized to the the pt in detail and then reviewed with pt  by my nurse highlighting any changes in therapy/plan of care  recommended at today's visit.

## 2016-08-10 ENCOUNTER — Ambulatory Visit (AMBULATORY_SURGERY_CENTER): Payer: Self-pay

## 2016-08-10 VITALS — Ht 68.5 in | Wt 145.0 lb

## 2016-08-10 DIAGNOSIS — Z1211 Encounter for screening for malignant neoplasm of colon: Secondary | ICD-10-CM

## 2016-08-10 NOTE — Progress Notes (Signed)
No allergies to eggs or soy No diet meds No home oxygen No past problems with anesthesia  Registered emmi 

## 2016-08-11 ENCOUNTER — Ambulatory Visit: Payer: 59 | Admitting: Adult Health

## 2016-08-11 ENCOUNTER — Encounter: Payer: Self-pay | Admitting: Internal Medicine

## 2016-08-18 ENCOUNTER — Telehealth: Payer: Self-pay | Admitting: Internal Medicine

## 2016-08-18 MED ORDER — BUDESONIDE-FORMOTEROL FUMARATE 160-4.5 MCG/ACT IN AERO
2.0000 | INHALATION_SPRAY | Freq: Two times a day (BID) | RESPIRATORY_TRACT | 5 refills | Status: DC
Start: 2016-08-18 — End: 2018-04-03

## 2016-08-18 NOTE — Telephone Encounter (Signed)
Pt last seen 4.9.18 by MW Called spoke with patient to verify medication and pharmacy Refills sent Nothing further needed; will sign off

## 2016-08-22 ENCOUNTER — Encounter: Payer: 59 | Admitting: Adult Health

## 2016-08-24 ENCOUNTER — Encounter: Payer: 59 | Admitting: Internal Medicine

## 2016-08-24 ENCOUNTER — Encounter: Payer: Self-pay | Admitting: Internal Medicine

## 2016-08-24 ENCOUNTER — Ambulatory Visit (AMBULATORY_SURGERY_CENTER): Payer: 59 | Admitting: Internal Medicine

## 2016-08-24 VITALS — BP 151/86 | HR 76 | Temp 98.2°F | Resp 16 | Ht 69.0 in | Wt 146.0 lb

## 2016-08-24 DIAGNOSIS — D125 Benign neoplasm of sigmoid colon: Secondary | ICD-10-CM

## 2016-08-24 DIAGNOSIS — D123 Benign neoplasm of transverse colon: Secondary | ICD-10-CM

## 2016-08-24 DIAGNOSIS — Z1211 Encounter for screening for malignant neoplasm of colon: Secondary | ICD-10-CM

## 2016-08-24 DIAGNOSIS — Z1212 Encounter for screening for malignant neoplasm of rectum: Secondary | ICD-10-CM | POA: Diagnosis not present

## 2016-08-24 DIAGNOSIS — D124 Benign neoplasm of descending colon: Secondary | ICD-10-CM

## 2016-08-24 DIAGNOSIS — D120 Benign neoplasm of cecum: Secondary | ICD-10-CM | POA: Diagnosis not present

## 2016-08-24 HISTORY — PX: COLONOSCOPY: SHX174

## 2016-08-24 MED ORDER — SODIUM CHLORIDE 0.9 % IV SOLN: 500.0000 mL | INTRAVENOUS | Status: DC

## 2016-08-24 NOTE — Progress Notes (Signed)
Report to PACU, RN, vss, BBS= Clear.  

## 2016-08-24 NOTE — Patient Instructions (Signed)
YOU HAD AN ENDOSCOPIC PROCEDURE TODAY AT THE [facility]:   Refer to the procedure report that was given to you for any specific questions about what was found during the examination.  If the procedure report does not answer your questions, please call your gastroenterologist to clarify.  If you requested that your care partner not be given the details of your procedure findings, then the procedure report has been included in a sealed envelope for you to review at your convenience later.  YOU SHOULD EXPECT: Some feelings of bloating in the abdomen. Passage of more gas than usual.  Walking can help get rid of the air that was put into your GI tract during the procedure and reduce the bloating. If you had a lower endoscopy (such as a colonoscopy or flexible sigmoidoscopy) you may notice spotting of blood in your stool or on the toilet paper. If you underwent a bowel prep for your procedure, you may not have a normal bowel movement for a few days.  Please Note:  You might notice some irritation and congestion in your nose or some drainage.  This is from the oxygen used during your procedure.  There is no need for concern and it should clear up in a day or so.  SYMPTOMS TO REPORT IMMEDIATELY:   Following lower endoscopy (colonoscopy or flexible sigmoidoscopy):  Excessive amounts of blood in the stool  Significant tenderness or worsening of abdominal pains  Swelling of the abdomen that is new, acute  Fever of 100F or higher    For urgent or emergent issues, a gastroenterologist can be reached at any hour by calling [phone].   DIET:  We do recommend a small meal at first, but then you may proceed to your regular diet.  Drink plenty of fluids but you should avoid alcoholic beverages for 24 hours.  ACTIVITY:  You should plan to take it easy for the rest of today and you should NOT DRIVE or use heavy machinery until tomorrow (because of the sedation medicines used during the test).     FOLLOW UP: Our staff will call the number listed on your records the next business day following your procedure to check on you and address any questions or concerns that you may have regarding the information given to you following your procedure. If we do not reach you, we will leave a message.  However, if you are feeling well and you are not experiencing any problems, there is no need to return our call.  We will assume that you have returned to your regular daily activities without incident.  If any biopsies were taken you will be contacted by phone or by letter within the next 1-3 weeks.  Please call us at [phone] if you have not heard about the biopsies in 3 weeks.    SIGNATURES/CONFIDENTIALITY: You and/or your care partner have signed paperwork which will be entered into your electronic medical record.  These signatures attest to the fact that that the information above on your After Visit Summary has been reviewed and is understood.  Full responsibility of the confidentiality of this discharge information lies with you and/or your care-partner.    Handouts were given to your care partner on polyps and diverticulosis. You may resume your current medications today. Await biopsy results. Please call if any questions or concerns.   

## 2016-08-24 NOTE — Op Note (Signed)
[facility] Patient Name: Richard Gates Procedure Date: 08/24/2016 8:07 AM MRN: [id number] Endoscopist: Jerene Bears , MD Age: 56 Referring MD:  Date of Birth: March 09, 1961 Gender: Male Account #: [id number] Procedure:                Colonoscopy Indications:              Screening for colorectal malignant neoplasm, This                            is the patient's first colonoscopy Medicines:                Monitored Anesthesia Care Procedure:                Pre-Anesthesia Assessment:                           - Prior to the procedure, a History and Physical                            was performed, and patient medications and                            allergies were reviewed. The patient's tolerance of                            previous anesthesia was also reviewed. The risks                            and benefits of the procedure and the sedation                            options and risks were discussed with the patient.                            All questions were answered, and informed consent                            was obtained. Prior Anticoagulants: The patient has                            taken no previous anticoagulant or antiplatelet                            agents. ASA Grade Assessment: II - A patient with                            mild systemic disease. After reviewing the risks                            and benefits, the patient was deemed in                            satisfactory condition to undergo the procedure.  After obtaining informed consent, the colonoscope                            was passed under direct vision. Throughout the                            procedure, the patient's blood pressure, pulse, and                            oxygen saturations were monitored continuously. The                            Colonoscope was introduced through the anus and                            advanced to the the cecum,  identified by                            appendiceal orifice and ileocecal valve. The                            colonoscopy was performed without difficulty. The                            patient tolerated the procedure well. The quality                            of the bowel preparation was good. The ileocecal                            valve, appendiceal orifice, and rectum were                            photographed. Scope In: 8:44:42 AM Scope Out: 9:06:58 AM Scope Withdrawal Time: 0 hours 19 minutes 53 seconds  Total Procedure Duration: 0 hours 22 minutes 16 seconds  Findings:                 The digital rectal exam was normal.                           Two sessile polyps were found in the cecum. The                            polyps were 3 to 6 mm in size. These polyps were                            removed with a cold snare. Resection and retrieval                            were complete.                           A 5 mm polyp was found in the ileocecal valve. The  polyp was sessile. The polyp was removed with a                            cold snare. Resection and retrieval were complete.                           A 5 mm polyp was found in the hepatic flexure. The                            polyp was sessile. The polyp was removed with a                            cold snare. Resection and retrieval were complete.                           A 7 mm polyp was found in the transverse colon. The                            polyp was sessile. The polyp was removed with a                            cold snare. Resection and retrieval were complete.                           A 6 mm polyp was found in the descending colon. The                            polyp was sessile. The polyp was removed with a                            cold snare. Resection and retrieval were complete.                           A 5 mm polyp was found in the sigmoid colon. The                             polyp was sessile. The polyp was removed with a                            cold snare. Resection and retrieval were complete.                           Multiple small and large-mouthed diverticula were                            found from cecum to sigmoid colon.                           The retroflexed view of the distal rectum and anal                            verge  was normal and showed no anal or rectal                            abnormalities. Complications:            No immediate complications. Estimated Blood Loss:     Estimated blood loss was minimal. Impression:               - Two 3 to 6 mm polyps in the cecum, removed with a                            cold snare. Resected and retrieved.                           - One 5 mm polyp at the ileocecal valve, removed                            with a cold snare. Resected and retrieved.                           - One 5 mm polyp at the hepatic flexure, removed                            with a cold snare. Resected and retrieved.                           - One 7 mm polyp in the transverse colon, removed                            with a cold snare. Resected and retrieved.                           - One 6 mm polyp in the descending colon, removed                            with a cold snare. Resected and retrieved.                           - One 5 mm polyp in the sigmoid colon, removed with                            a cold snare. Resected and retrieved.                           - Moderate diverticulosis from cecum to sigmoid                            colon.                           - The distal rectum and anal verge are normal on                            retroflexion view. Recommendation:           -  Patient has a contact number available for                            emergencies. The signs and symptoms of potential                            delayed complications were discussed with the                             patient. Return to normal activities tomorrow.                            Written discharge instructions were provided to the                            patient.                           - Resume previous diet.                           - Continue present medications.                           - Await pathology results.                           - Repeat colonoscopy is recommended for                            surveillance. The colonoscopy date will be                            determined after pathology results from today's                            exam become available for review. Jerene Bears, MD 08/24/2016 9:13:25 AM This report has been signed electronically.

## 2016-08-24 NOTE — Progress Notes (Signed)
Pt's states no medical or surgical changes since previsit or office visit. 

## 2016-08-24 NOTE — Progress Notes (Signed)
Called to room to assist during endoscopic procedure.  Patient ID and intended procedure confirmed with present staff. Received instructions for my participation in the procedure from the performing physician.  

## 2016-08-24 NOTE — Progress Notes (Signed)
No problems noted in the recovery room. maw 

## 2016-08-25 ENCOUNTER — Telehealth: Payer: Self-pay

## 2016-08-25 NOTE — Telephone Encounter (Signed)
  Follow up Call-  Call back number 08/24/2016  Post procedure Call Back phone  # [phone]  Permission to leave phone message Yes  Some recent data might be hidden     Patient questions:  Do you have a fever, pain , or abdominal swelling? No. Pain Score  0 *  Have you tolerated food without any problems? Yes.    Have you been able to return to your normal activities? Yes.    Do you have any questions about your discharge instructions: Diet   No. Medications  No. Follow up visit  No.  Do you have questions or concerns about your Care? No.  Actions: * If pain score is 4 or above: No action needed, pain <4.

## 2016-08-30 ENCOUNTER — Encounter: Payer: Self-pay | Admitting: Internal Medicine

## 2016-09-05 ENCOUNTER — Encounter: Payer: 59 | Admitting: Adult Health

## 2016-09-06 ENCOUNTER — Encounter: Payer: 59 | Admitting: Adult Health

## 2016-09-15 ENCOUNTER — Encounter: Payer: 59 | Admitting: Adult Health

## 2016-09-21 ENCOUNTER — Ambulatory Visit: Payer: 59 | Admitting: Internal Medicine

## 2016-09-27 ENCOUNTER — Encounter: Payer: 59 | Admitting: Adult Health

## 2016-09-30 ENCOUNTER — Ambulatory Visit: Payer: 59 | Admitting: Internal Medicine

## 2016-10-13 ENCOUNTER — Encounter: Payer: 59 | Admitting: Adult Health

## 2016-11-11 ENCOUNTER — Encounter: Payer: 59 | Admitting: Adult Health

## 2016-12-15 ENCOUNTER — Encounter: Payer: 59 | Admitting: Adult Health

## 2017-01-30 ENCOUNTER — Other Ambulatory Visit: Payer: Self-pay | Admitting: Internal Medicine

## 2018-03-28 ENCOUNTER — Encounter: Payer: 59 | Admitting: Internal Medicine

## 2018-04-01 HISTORY — PX: TRANSTHORACIC ECHOCARDIOGRAM: SHX275

## 2018-04-03 ENCOUNTER — Ambulatory Visit (INDEPENDENT_AMBULATORY_CARE_PROVIDER_SITE_OTHER): Payer: 59 | Admitting: Internal Medicine

## 2018-04-03 ENCOUNTER — Encounter: Payer: Self-pay | Admitting: Internal Medicine

## 2018-04-03 VITALS — BP 132/74 | HR 98 | Temp 97.8°F | Resp 16 | Ht 69.0 in | Wt 162.4 lb

## 2018-04-03 DIAGNOSIS — Z720 Tobacco use: Secondary | ICD-10-CM

## 2018-04-03 DIAGNOSIS — I2510 Atherosclerotic heart disease of native coronary artery without angina pectoris: Secondary | ICD-10-CM | POA: Diagnosis not present

## 2018-04-03 DIAGNOSIS — F329 Major depressive disorder, single episode, unspecified: Secondary | ICD-10-CM

## 2018-04-03 DIAGNOSIS — Z122 Encounter for screening for malignant neoplasm of respiratory organs: Secondary | ICD-10-CM

## 2018-04-03 DIAGNOSIS — J449 Chronic obstructive pulmonary disease, unspecified: Secondary | ICD-10-CM

## 2018-04-03 DIAGNOSIS — F419 Anxiety disorder, unspecified: Secondary | ICD-10-CM

## 2018-04-03 DIAGNOSIS — Z0000 Encounter for general adult medical examination without abnormal findings: Secondary | ICD-10-CM | POA: Diagnosis not present

## 2018-04-03 MED ORDER — ALBUTEROL SULFATE 108 (90 BASE) MCG/ACT IN AEPB: 2.0000 | INHALATION_SPRAY | Freq: Four times a day (QID) | RESPIRATORY_TRACT | 12 refills | Status: DC | PRN

## 2018-04-03 MED ORDER — ESCITALOPRAM OXALATE 10 MG PO TABS: 10.0000 mg | ORAL_TABLET | Freq: Every day | ORAL | 1 refills | Status: DC

## 2018-04-03 NOTE — Progress Notes (Signed)
Subjective:    Patient ID: Richard Gates, male    DOB: Jul 06, 1960, 57 y.o.   MRN: [id number]  DOS:  04/03/2018 Type of visit - description : cpx Here for CPX, has several concerns   Review of Systems History of COPD, currently taking no medication, occasional wheezing.  No recent cough. He has very thirsty mostly at bedtime. Denies any weight loss, polyuria or visual disturbances. Also 1 year history of anxiety, depression: His father had a stroke July 2019, his mother is ill, having some issues w/ his wife.  Has not reach out for help. Denies suicidal or homicidal ideas. Does not sleep well.  Other than above, a 14 point review of systems is negative     Past Medical History:  Diagnosis Date  . COPD (chronic obstructive pulmonary disease) ([place])   . GERD (gastroesophageal reflux disease)     Past Surgical History:  Procedure Laterality Date  . APPENDECTOMY      Social History   Socioeconomic History  . Marital status: Married    Spouse name: Not on file  . Number of children: 1  . Years of education: Not on file  . Highest education level: Not on file  Occupational History  . Occupation: Print production planner, order processing   Social Needs  . Financial resource strain: Not on file  . Food insecurity:    Worry: Not on file    Inability: Not on file  . Transportation needs:    Medical: Not on file    Non-medical: Not on file  Tobacco Use  . Smoking status: Current Some Day Smoker    Last attempt to quit: 07/31/2016    Years since quitting: 1.6  . Smokeless tobacco: Never Used  . Tobacco comment: 1 pack a day from age 74 to 67, now smokes very rarely  Substance and Sexual Activity  . Alcohol use: Yes    Comment: Social  . Drug use: No  . Sexual activity: Not on file  Lifestyle  . Physical activity:    Days per week: Not on file    Minutes per session: Not on file  . Stress: Not on file  Relationships  . Social connections:    Talks on phone: Not on file   Gets together: Not on file    Attends religious service: Not on file    Active member of club or organization: Not on file    Attends meetings of clubs or organizations: Not on file    Relationship status: Not on file  . Intimate partner violence:    Fear of current or ex partner: Not on file    Emotionally abused: Not on file    Physically abused: Not on file    Forced sexual activity: Not on file  Other Topics Concern  . Not on file  Social History Narrative   Household- pt and wife   1 daughter    87 g-children   1 g-g child     Family History  Problem Relation Age of Onset  . Stroke Mother   . Hypertension Unknown   . Heart attack Paternal Uncle   . Breast cancer Sister   . Colon cancer Neg Hx   . Prostate cancer Neg Hx      Allergies as of 04/03/2018   No Known Allergies     Medication List        Accurate as of 04/03/18 11:59 PM. Always use your most recent med list.  Albuterol Sulfate 108 (90 Base) MCG/ACT Aepb Inhale 2 puffs into the lungs every 6 (six) hours as needed.   escitalopram 10 MG tablet Commonly known as:  LEXAPRO Take 1 tablet (10 mg total) by mouth at bedtime.           Objective:   Physical Exam BP 132/74 (BP Location: Left Arm, Patient Position: Sitting, Cuff Size: Small)   Pulse 98   Temp 97.8 F (36.6 C) (Oral)   Resp 16   Ht 5\' 9"  (1.753 m)   Wt 162 lb 6 oz (73.7 kg)   SpO2 94%   BMI 23.98 kg/m  General: Well developed, NAD, BMI noted Neck: No  thyromegaly  HEENT:  Normocephalic . Face symmetric, atraumatic. Very prominent parotid glands, they are however symmetric, soft, nontender, not nodular. Lungs:  CTA B Normal respiratory effort, no intercostal retractions, no accessory muscle use. Heart: RRR,  no murmur.  No pretibial edema bilaterally  Abdomen:  Not distended, soft, non-tender. No rebound or rigidity.   Rectal: External abnormalities: none. Normal sphincter tone. No rectal masses or tenderness.  No  stools  Prostate: Prostate gland firm and smooth, on previous exam in 2018 the gland felt asymmetric, it does not feel that way today. Skin: Exposed areas without rash. Not pale. Not jaundice Neurologic:  alert & oriented X3.  Speech normal, gait appropriate for age and unassisted Strength symmetric and appropriate for age.  Psych: Cognition and judgment appear intact.  Cooperative with normal attention span and concentration.  Behavior appropriate. slt depressed > anxious  appearing.     Assessment & Plan:    Assessment GERD Allergies , nasal  COPD, PFTs 2013showed ratio of 58, FEV1 of 1.78- 52% without significant bronchodilator response, TLC was 77% with DLCO 78% Smoker  PLAN: Here for CPX COPD: Has occasional wheezing, no cough.  Currently taking no medications.  Recommend albuterol as needed. Tobacco abuse: He smoked a pack a day for about 40 years.  Currently smoking very little.  Counseled. Anxiety depression: See review of systems.  PHQ 9: 14.  Counseled, information about counselors provided, discussed medication.  Patient reports that he feels  is ready for some help, we agreed to start Lexapro, how to take it and side effects discussed including suicidal ideas.  Reassess in 6 weeks. Prominent parotid glands, see physical exam, observation for now. Has some unusual sxs such as sweating a lot when he eats (denies vasomotor rhinitis), has also excessive thirst at night.  Were taken general labs.  Observation for now RTC 6 weeks   Today, in addition to CPX I spent more than  20  min with the patient: >50% of the time counseling regards anxiety-depression, discussing some unusual sxs

## 2018-04-03 NOTE — Patient Instructions (Signed)
GO TO THE LAB : Get the blood work     GO TO THE FRONT DESK Schedule your next appointment for a  Check up in 6 weeks   Please a start taking escitalopram 1 tablet at bedtime to help with anxiety  Watch for side effects including suicidal ideas  Consider seeing a counselor

## 2018-04-03 NOTE — Assessment & Plan Note (Addendum)
Td 2013; declined flu shot  [facility]S-  cscope 07-2016, next per  GI Prostate ca screening:  DRE seems okay today, check a PSA Tobacco: Smoked 1 pack a day for 40 years, he qualifies for a lung cancer screening.  Will schedule. Smoking very little now, counseled. Labs: CMP, FLP, CBC, A1c, TSH, PSA Diet and exercise discussed

## 2018-04-03 NOTE — Progress Notes (Signed)
Pre visit review using our clinic review tool, if applicable. No additional management support is needed unless otherwise documented below in the visit note. 

## 2018-04-04 LAB — CBC WITH DIFFERENTIAL/PLATELET
Basophils Absolute: 0.1 10*3/uL (ref 0.0–0.1)
Basophils Relative: 1.1 % (ref 0.0–3.0)
Eosinophils Absolute: 0.1 10*3/uL (ref 0.0–0.7)
Eosinophils Relative: 1.8 % (ref 0.0–5.0)
HCT: 40.6 % (ref 39.0–52.0)
HEMOGLOBIN: 13.7 g/dL (ref 13.0–17.0)
Lymphocytes Relative: 21.8 % (ref 12.0–46.0)
Lymphs Abs: 1.6 10*3/uL (ref 0.7–4.0)
MCHC: 33.7 g/dL (ref 30.0–36.0)
MCV: 94.3 fl (ref 78.0–100.0)
Monocytes Absolute: 0.6 10*3/uL (ref 0.1–1.0)
Monocytes Relative: 8.5 % (ref 3.0–12.0)
Neutro Abs: 4.9 10*3/uL (ref 1.4–7.7)
Neutrophils Relative %: 66.8 % (ref 43.0–77.0)
Platelets: 336 10*3/uL (ref 150.0–400.0)
RBC: 4.3 Mil/uL (ref 4.22–5.81)
RDW: 13.9 % (ref 11.5–15.5)
WBC: 7.3 10*3/uL (ref 4.0–10.5)

## 2018-04-04 LAB — LIPID PANEL
CHOL/HDL RATIO: 3
CHOLESTEROL: 209 mg/dL — AB (ref 0–200)
HDL: 72 mg/dL (ref >39.00)
NonHDL: 137.22
Triglycerides: 217 mg/dL — ABNORMAL HIGH (ref 0.0–149.0)
VLDL: 43.4 mg/dL — AB (ref 0.0–40.0)

## 2018-04-04 LAB — COMPREHENSIVE METABOLIC PANEL
ALBUMIN: 4.6 g/dL (ref 3.5–5.2)
ALT: 50 U/L (ref 0–53)
AST: 28 U/L (ref 0–37)
Alkaline Phosphatase: 69 U/L (ref 39–117)
BUN: 16 mg/dL (ref 6–23)
CALCIUM: 10.4 mg/dL (ref 8.4–10.5)
CHLORIDE: 103 meq/L (ref 96–112)
CO2: 29 mEq/L (ref 19–32)
Creatinine, Ser: 0.82 mg/dL (ref 0.40–1.50)
GFR: 124.11 mL/min (ref >60.00)
GLUCOSE: 105 mg/dL — AB (ref 70–99)
POTASSIUM: 4.4 meq/L (ref 3.5–5.1)
SODIUM: 141 meq/L (ref 135–145)
TOTAL PROTEIN: 7.4 g/dL (ref 6.0–8.3)
Total Bilirubin: 0.3 mg/dL (ref 0.2–1.2)

## 2018-04-04 LAB — PSA: PSA: 0.39 ng/mL (ref 0.10–4.00)

## 2018-04-04 LAB — HEMOGLOBIN A1C: Hgb A1c MFr Bld: 5.9 % (ref 4.6–6.5)

## 2018-04-04 LAB — TSH: TSH: 1.44 u[IU]/mL (ref 0.35–4.50)

## 2018-04-04 LAB — LDL CHOLESTEROL, DIRECT: Direct LDL: 108 mg/dL

## 2018-04-04 NOTE — Assessment & Plan Note (Addendum)
Here for CPX COPD: Has occasional wheezing, no cough.  Currently taking no medications.  Recommend albuterol as needed. Tobacco abuse: He smoked a pack a day for about 40 years.  Currently smoking very little.  Counseled. Anxiety depression: See review of systems.  PHQ 9: 14.  Counseled, information about counselors provided, discussed medication.  Patient reports that he feels  is ready for some help, we agreed to start Lexapro, how to take it and side effects discussed including suicidal ideas.  Reassess in 6 weeks. Prominent parotid glands, see physical exam, observation for now. Has some unusual sxs such as sweating a lot when he eats (denies vasomotor rhinitis), has also excessive thirst at night.  Were taken general labs.  Observation for now RTC 6 weeks

## 2018-04-05 ENCOUNTER — Ambulatory Visit (HOSPITAL_BASED_OUTPATIENT_CLINIC_OR_DEPARTMENT_OTHER)
Admission: RE | Admit: 2018-04-05 | Discharge: 2018-04-05 | Disposition: A | Discharge status: Home / Self Care | Payer: 59 | Source: Ambulatory Visit | Attending: Internal Medicine | Admitting: Internal Medicine

## 2018-04-05 DIAGNOSIS — Z720 Tobacco use: Secondary | ICD-10-CM | POA: Diagnosis present

## 2018-04-05 DIAGNOSIS — Z122 Encounter for screening for malignant neoplasm of respiratory organs: Secondary | ICD-10-CM | POA: Diagnosis not present

## 2018-04-09 NOTE — Addendum Note (Signed)
Addended byDamita Dunnings D on: 04/09/2018 01:11 PM   Modules accepted: Orders

## 2018-04-16 ENCOUNTER — Encounter (HOSPITAL_BASED_OUTPATIENT_CLINIC_OR_DEPARTMENT_OTHER): Payer: Self-pay | Admitting: Respiratory Therapy

## 2018-04-16 ENCOUNTER — Ambulatory Visit: Payer: Self-pay

## 2018-04-16 ENCOUNTER — Emergency Department (HOSPITAL_BASED_OUTPATIENT_CLINIC_OR_DEPARTMENT_OTHER): Payer: 59

## 2018-04-16 ENCOUNTER — Inpatient Hospital Stay (HOSPITAL_COMMUNITY): Payer: 59

## 2018-04-16 ENCOUNTER — Other Ambulatory Visit: Payer: Self-pay

## 2018-04-16 ENCOUNTER — Inpatient Hospital Stay (HOSPITAL_BASED_OUTPATIENT_CLINIC_OR_DEPARTMENT_OTHER)
Admission: EM | Admit: 2018-04-16 | Discharge: 2018-04-18 | DRG: 190 | Disposition: A | Discharge status: Home / Self Care | Payer: 59 | Attending: Family Medicine | Admitting: Family Medicine

## 2018-04-16 DIAGNOSIS — R778 Other specified abnormalities of plasma proteins: Secondary | ICD-10-CM

## 2018-04-16 DIAGNOSIS — R030 Elevated blood-pressure reading, without diagnosis of hypertension: Secondary | ICD-10-CM | POA: Diagnosis present

## 2018-04-16 DIAGNOSIS — K111 Hypertrophy of salivary gland: Secondary | ICD-10-CM | POA: Diagnosis present

## 2018-04-16 DIAGNOSIS — J441 Chronic obstructive pulmonary disease with (acute) exacerbation: Secondary | ICD-10-CM | POA: Diagnosis present

## 2018-04-16 DIAGNOSIS — F329 Major depressive disorder, single episode, unspecified: Secondary | ICD-10-CM | POA: Diagnosis present

## 2018-04-16 DIAGNOSIS — I361 Nonrheumatic tricuspid (valve) insufficiency: Secondary | ICD-10-CM | POA: Diagnosis not present

## 2018-04-16 DIAGNOSIS — J9601 Acute respiratory failure with hypoxia: Secondary | ICD-10-CM | POA: Diagnosis present

## 2018-04-16 DIAGNOSIS — I10 Essential (primary) hypertension: Secondary | ICD-10-CM | POA: Diagnosis present

## 2018-04-16 DIAGNOSIS — I248 Other forms of acute ischemic heart disease: Secondary | ICD-10-CM | POA: Diagnosis present

## 2018-04-16 DIAGNOSIS — Z87891 Personal history of nicotine dependence: Secondary | ICD-10-CM

## 2018-04-16 DIAGNOSIS — T486X5A Adverse effect of antiasthmatics, initial encounter: Secondary | ICD-10-CM | POA: Diagnosis present

## 2018-04-16 DIAGNOSIS — R7989 Other specified abnormal findings of blood chemistry: Secondary | ICD-10-CM | POA: Diagnosis not present

## 2018-04-16 DIAGNOSIS — Z79899 Other long term (current) drug therapy: Secondary | ICD-10-CM | POA: Diagnosis not present

## 2018-04-16 DIAGNOSIS — R000 Tachycardia, unspecified: Secondary | ICD-10-CM | POA: Diagnosis present

## 2018-04-16 LAB — TROPONIN I
Troponin I: 0.05 ng/mL (ref <0.03)
Troponin I: 0.04 ng/mL (ref <0.03)
Troponin I: 0.04 ng/mL (ref <0.03)

## 2018-04-16 LAB — BASIC METABOLIC PANEL
ANION GAP: 9 (ref 5–15)
BUN: 20 mg/dL (ref 6–20)
CO2: 32 mmol/L (ref 22–32)
Calcium: 10.1 mg/dL (ref 8.9–10.3)
Chloride: 99 mmol/L (ref 98–111)
Creatinine, Ser: 0.78 mg/dL (ref 0.61–1.24)
GFR calc Af Amer: >60 mL/min (ref >60)
GFR calc non Af Amer: >60 mL/min (ref >60)
GLUCOSE: 119 mg/dL — AB (ref 70–99)
Potassium: 4 mmol/L (ref 3.5–5.1)
Sodium: 140 mmol/L (ref 135–145)

## 2018-04-16 LAB — BLOOD GAS, ARTERIAL
Acid-Base Excess: 3.9 mmol/L — ABNORMAL HIGH (ref 0.0–2.0)
Bicarbonate: 30 mmol/L — ABNORMAL HIGH (ref 20.0–28.0)
DRAWN BY: 257881
O2 Content: 2 L/min
O2 Saturation: 92.8 %
Patient temperature: 98.6
pCO2 arterial: 53.4 mmHg — ABNORMAL HIGH (ref 32.0–48.0)
pH, Arterial: 7.369 (ref 7.350–7.450)
pO2, Arterial: 68.8 mmHg — ABNORMAL LOW (ref 83.0–108.0)

## 2018-04-16 LAB — CBC
HCT: 47.8 % (ref 39.0–52.0)
Hemoglobin: 15.2 g/dL (ref 13.0–17.0)
MCH: 30.7 pg (ref 26.0–34.0)
MCHC: 31.8 g/dL (ref 30.0–36.0)
MCV: 96.6 fL (ref 80.0–100.0)
PLATELETS: 321 10*3/uL (ref 150–400)
RBC: 4.95 MIL/uL (ref 4.22–5.81)
RDW: 13.2 % (ref 11.5–15.5)
WBC: 7.7 10*3/uL (ref 4.0–10.5)
nRBC: 0 % (ref 0.0–0.2)

## 2018-04-16 LAB — CBC WITH DIFFERENTIAL/PLATELET
Abs Immature Granulocytes: 0.02 10*3/uL (ref 0.00–0.07)
Basophils Absolute: 0.1 10*3/uL (ref 0.0–0.1)
Basophils Relative: 1 %
Eosinophils Absolute: 0.8 10*3/uL — ABNORMAL HIGH (ref 0.0–0.5)
Eosinophils Relative: 11 %
HCT: 48.1 % (ref 39.0–52.0)
Hemoglobin: 15.4 g/dL (ref 13.0–17.0)
Immature Granulocytes: 0 %
Lymphocytes Relative: 21 %
Lymphs Abs: 1.6 10*3/uL (ref 0.7–4.0)
MCH: 30.7 pg (ref 26.0–34.0)
MCHC: 32 g/dL (ref 30.0–36.0)
MCV: 95.8 fL (ref 80.0–100.0)
Monocytes Absolute: 0.8 10*3/uL (ref 0.1–1.0)
Monocytes Relative: 10 %
Neutro Abs: 4.3 10*3/uL (ref 1.7–7.7)
Neutrophils Relative %: 57 %
PLATELETS: 335 10*3/uL (ref 150–400)
RBC: 5.02 MIL/uL (ref 4.22–5.81)
RDW: 13.2 % (ref 11.5–15.5)
WBC: 7.5 10*3/uL (ref 4.0–10.5)
nRBC: 0 % (ref 0.0–0.2)

## 2018-04-16 LAB — CREATININE, SERUM
Creatinine, Ser: 0.81 mg/dL (ref 0.61–1.24)
GFR calc Af Amer: >60 mL/min (ref >60)
GFR calc non Af Amer: >60 mL/min (ref >60)

## 2018-04-16 LAB — ECHOCARDIOGRAM COMPLETE
Height: 69 in
Weight: 2409.6 oz

## 2018-04-16 LAB — D-DIMER, QUANTITATIVE: D-Dimer, Quant: 0.32 ug/mL-FEU (ref 0.00–0.50)

## 2018-04-16 LAB — BRAIN NATRIURETIC PEPTIDE: B Natriuretic Peptide: 41 pg/mL (ref 0.0–100.0)

## 2018-04-16 MED ORDER — ASPIRIN 81 MG PO CHEW
324.0000 mg | CHEWABLE_TABLET | Freq: Once | ORAL | Status: AC
  Administered 2018-04-16: 324 mg via ORAL
  Filled 2018-04-16: qty 4

## 2018-04-16 MED ORDER — GUAIFENESIN ER 600 MG PO TB12
600.0000 mg | ORAL_TABLET | Freq: Two times a day (BID) | ORAL | Status: DC
  Administered 2018-04-16 – 2018-04-18 (×5): 600 mg via ORAL
  Filled 2018-04-16 (×5): qty 1

## 2018-04-16 MED ORDER — ALBUTEROL SULFATE (2.5 MG/3ML) 0.083% IN NEBU: 10.0000 mg | INHALATION_SOLUTION | Freq: Once | RESPIRATORY_TRACT | Status: DC

## 2018-04-16 MED ORDER — ALBUTEROL SULFATE (2.5 MG/3ML) 0.083% IN NEBU
5.0000 mg | INHALATION_SOLUTION | Freq: Once | RESPIRATORY_TRACT | Status: AC
  Administered 2018-04-16: 5 mg via RESPIRATORY_TRACT

## 2018-04-16 MED ORDER — ENOXAPARIN SODIUM 40 MG/0.4ML ~~LOC~~ SOLN
40.0000 mg | SUBCUTANEOUS | Status: DC
  Administered 2018-04-16 – 2018-04-17 (×2): 40 mg via SUBCUTANEOUS
  Filled 2018-04-16 (×2): qty 0.4

## 2018-04-16 MED ORDER — IPRATROPIUM BROMIDE 0.02 % IN SOLN
RESPIRATORY_TRACT | Status: AC
  Administered 2018-04-16: 0.5 mg
  Filled 2018-04-16: qty 2.5

## 2018-04-16 MED ORDER — ACETAMINOPHEN 325 MG PO TABS: 650.0000 mg | ORAL_TABLET | Freq: Four times a day (QID) | ORAL | Status: DC | PRN

## 2018-04-16 MED ORDER — METHYLPREDNISOLONE SODIUM SUCC 125 MG IJ SOLR
80.0000 mg | Freq: Three times a day (TID) | INTRAMUSCULAR | Status: DC
  Administered 2018-04-16 – 2018-04-17 (×3): 80 mg via INTRAVENOUS
  Filled 2018-04-16 (×3): qty 2

## 2018-04-16 MED ORDER — ALBUTEROL SULFATE (2.5 MG/3ML) 0.083% IN NEBU: 3.0000 mL | INHALATION_SOLUTION | Freq: Four times a day (QID) | RESPIRATORY_TRACT | Status: DC | PRN

## 2018-04-16 MED ORDER — ESCITALOPRAM OXALATE 10 MG PO TABS
10.0000 mg | ORAL_TABLET | Freq: Every day | ORAL | Status: DC
  Administered 2018-04-16 – 2018-04-17 (×2): 10 mg via ORAL
  Filled 2018-04-16 (×2): qty 1

## 2018-04-16 MED ORDER — IPRATROPIUM BROMIDE 0.02 % IN SOLN: 0.5000 mg | Freq: Once | RESPIRATORY_TRACT | Status: DC

## 2018-04-16 MED ORDER — ALBUTEROL SULFATE (2.5 MG/3ML) 0.083% IN NEBU
INHALATION_SOLUTION | RESPIRATORY_TRACT | Status: AC
  Filled 2018-04-16: qty 6

## 2018-04-16 MED ORDER — ALBUTEROL SULFATE (2.5 MG/3ML) 0.083% IN NEBU: 2.5000 mg | INHALATION_SOLUTION | RESPIRATORY_TRACT | Status: DC | PRN

## 2018-04-16 MED ORDER — ONDANSETRON HCL 4 MG PO TABS: 4.0000 mg | ORAL_TABLET | Freq: Four times a day (QID) | ORAL | Status: DC | PRN

## 2018-04-16 MED ORDER — ALBUTEROL SULFATE (2.5 MG/3ML) 0.083% IN NEBU
INHALATION_SOLUTION | RESPIRATORY_TRACT | Status: AC
  Administered 2018-04-16: 10 mg
  Filled 2018-04-16: qty 12

## 2018-04-16 MED ORDER — IPRATROPIUM-ALBUTEROL 0.5-2.5 (3) MG/3ML IN SOLN
3.0000 mL | RESPIRATORY_TRACT | Status: DC
  Administered 2018-04-16 – 2018-04-17 (×3): 3 mL via RESPIRATORY_TRACT
  Filled 2018-04-16 (×4): qty 3

## 2018-04-16 MED ORDER — MAGNESIUM SULFATE 2 GM/50ML IV SOLN
2.0000 g | Freq: Once | INTRAVENOUS | Status: AC
  Administered 2018-04-16: 2 g via INTRAVENOUS
  Filled 2018-04-16: qty 50

## 2018-04-16 MED ORDER — DOXYCYCLINE HYCLATE 100 MG PO TABS
100.0000 mg | ORAL_TABLET | Freq: Two times a day (BID) | ORAL | Status: DC
  Administered 2018-04-16 – 2018-04-18 (×5): 100 mg via ORAL
  Filled 2018-04-16 (×5): qty 1

## 2018-04-16 MED ORDER — ONDANSETRON HCL 4 MG/2ML IJ SOLN: 4.0000 mg | Freq: Four times a day (QID) | INTRAMUSCULAR | Status: DC | PRN

## 2018-04-16 MED ORDER — ACETAMINOPHEN 650 MG RE SUPP: 650.0000 mg | Freq: Four times a day (QID) | RECTAL | Status: DC | PRN

## 2018-04-16 MED ORDER — METHYLPREDNISOLONE SODIUM SUCC 125 MG IJ SOLR
125.0000 mg | Freq: Once | INTRAMUSCULAR | Status: AC
  Administered 2018-04-16: 125 mg via INTRAVENOUS
  Filled 2018-04-16: qty 2

## 2018-04-16 NOTE — ED Notes (Signed)
Patient arrived with shortness of breath.  Wheeled to room and attached to cardiac monitor with an O2 sat of 84% on RA.  O2 [place] at 2lpm started and RT called to room.  Breathing treatment started by RT.

## 2018-04-16 NOTE — Progress Notes (Signed)
CRITICAL VALUE ALERT  Critical Value:  TROPONIN 0.04  Date & Time Notied:  04/16/18 1422  Provider Notified: [id number]   Orders Received/Actions taken: N/A

## 2018-04-16 NOTE — Progress Notes (Signed)
  Echocardiogram 2D Echocardiogram has been performed.  Richard Gates 04/16/2018, 3:41 PM

## 2018-04-16 NOTE — Progress Notes (Signed)
61 F with COPD not on home o2 with SOB and wheezing with 84%, given continuous nebs , sats improved on 2L [place]. Hypertensive with BP 194/126,( not on any BP meds) improving as breathing better. Labs unremarkable except for troponin of 0.05, no chest pain , EKG shows sinus tach. Received solumedrol, ASA, nebs in ED  admit to [facility] telemetry.

## 2018-04-16 NOTE — ED Triage Notes (Signed)
Shortness of breath since Thursday. Went to see PCP and was prescribed with inhalers with no relief.

## 2018-04-16 NOTE — Progress Notes (Addendum)
RT note-Second HHneb given 5 mg albuterol. Remains on 2l/min [place]. Mild to moderate improvement.

## 2018-04-16 NOTE — Telephone Encounter (Signed)
Pt c/o moderate shortness of breath and cough, runny nose and wheezing. Pt's wheezing can be heard over the phone. Symptoms began last Wednesday. Pt with h/o COPD. Pt stated that his SOB comes and goes, but when it starts he is very out of breath and then the inhaler helps but for a short time. Pt is also coughing up white foamy to green phlegm and pt stated he is having bilateral side pain "from coughing." Pt advised going to the ED. Pt given care advice and pt verbalized understanding.   Reason for Disposition . Wheezing can be heard across the room . [1] MODERATE difficulty breathing (e.g., speaks in phrases, SOB even at rest, pulse 100-120) AND [2] NEW-onset or WORSE than normal  Answer Assessment - Initial Assessment Questions 1. RESPIRATORY STATUS: "Describe your breathing?" (e.g., wheezing, shortness of breath, unable to speak, severe coughing)      Shortness of breath, wheezing 2. ONSET: "When did this breathing problem begin?"      Last Thursday 3. PATTERN "Does the difficult breathing come and go, or has it been constant since it started?"      Comes and goes but severe once it starts 4. SEVERITY: "How bad is your breathing?" (e.g., mild, moderate, severe)    - MILD: No SOB at rest, mild SOB with walking, speaks normally in sentences, can lay down, no retractions, pulse < 100.    - MODERATE: SOB at rest, SOB with minimal exertion and prefers to sit, cannot lie down flat, speaks in phrases, mild retractions, audible wheezing, pulse 100-120.    - SEVERE: Very SOB at rest, speaks in single words, struggling to breathe, sitting hunched forward, retractions, pulse > 120      modertate 5. RECURRENT SYMPTOM: "Have you had difficulty breathing before?" If so, ask: "When was the last time?" and "What happened that time?"      Yes- 2 years ago- gave medicine and made 6. CARDIAC HISTORY: "Do you have any history of heart disease?" (e.g., heart attack, angina, bypass surgery, angioplasty)       no 7. LUNG HISTORY: "Do you have any history of lung disease?"  (e.g., pulmonary embolus, asthma, emphysema)     COPD 8. CAUSE: "What do you think is causing the breathing problem?"      COPD 9. OTHER SYMPTOMS: "Do you have any other symptoms? (e.g., dizziness, runny nose, cough, chest pain, fever)     Runny nose, white to green phlegm with cough, audible wheezing, side pain "from coughing so much." 10. PREGNANCY: "Is there any chance you are pregnant?" "When was your last menstrual period?"       n/a 11. TRAVEL: "Have you traveled out of the country in the last month?" (e.g., travel history, exposures)       no  Protocols used: BREATHING DIFFICULTY-A-AH

## 2018-04-16 NOTE — ED Provider Notes (Signed)
[facility] EMERGENCY DEPARTMENT Provider Note   CSN: [id number] Arrival date & time: 04/16/18  1308     History   Chief Complaint Chief Complaint  Patient presents with  . Shortness of Breath    HPI Richard Gates is a 57 y.o. male.  Pt presents to the ED today with sob.  Pt said he's had sob for the past few days.  He did see his PCP on 12/3 and had a few wheezes.  He was started on an albuterol mdi.  He said that has not helped.  He has a hx of COPD, but no normal oxygen requirement.  He had a RA O2 sat of 84%.  The pt denies f/c.  Pt has a hx of smoking, but said he no longer smokes.     Past Medical History:  Diagnosis Date  . COPD (chronic obstructive pulmonary disease) ([place])   . GERD (gastroesophageal reflux disease)     Patient Active Problem List   Diagnosis Date Noted  . COPD mixed type ([place]) 07/19/2016  . PCP NOTES >>>>>>>>>>>>>>> 06/23/2016  . Tobacco abuse 02/07/2012  . Anxiety and depression 02/07/2012  . Cough 01/03/2012  . Tachycardia 01/03/2012  . Annual physical exam 05/12/2011    Past Surgical History:  Procedure Laterality Date  . APPENDECTOMY          Home Medications    Prior to Admission medications   Medication Sig Start Date End Date Taking? Authorizing Provider  Albuterol Sulfate (PROAIR RESPICLICK) 657 (90 Base) MCG/ACT AEPB Inhale 2 puffs into the lungs every 6 (six) hours as needed. 04/03/18  Yes Paz, Alda Berthold, MD  escitalopram (LEXAPRO) 10 MG tablet Take 1 tablet (10 mg total) by mouth at bedtime. 04/03/18   Colon Branch, MD    Family History Family History  Problem Relation Age of Onset  . Stroke Mother   . Hypertension Other   . Heart attack Paternal Uncle   . Breast cancer Sister   . Colon cancer Neg Hx   . Prostate cancer Neg Hx     Social History Social History   Tobacco Use  . Smoking status: Former Smoker    Last attempt to quit: 07/31/2016    Years since quitting: 1.7  . Smokeless tobacco: Never Used   . Tobacco comment: 1 pack a day from age 14 to 12, now smokes very rarely  Substance Use Topics  . Alcohol use: Yes    Comment: Social  . Drug use: No     Allergies   Patient has no known allergies.   Review of Systems Review of Systems  Respiratory: Positive for shortness of breath.   All other systems reviewed and are negative.    Physical Exam Updated Vital Signs BP (!) 160/95   Pulse (!) 108   Temp 98.2 F (36.8 C) (Oral)   Resp (!) 28   Ht 5\' 9"  (1.753 m)   Wt 70.3 kg   SpO2 95%   BMI 22.87 kg/m   Physical Exam Vitals signs and nursing note reviewed.  Constitutional:      General: He is in acute distress.     Appearance: Normal appearance. He is normal weight.  HENT:     Head: Normocephalic and atraumatic.     Right Ear: External ear normal.     Left Ear: External ear normal.     Nose: Nose normal.     Mouth/Throat:     Mouth: Mucous membranes are  moist.     Pharynx: Oropharynx is clear.  Eyes:     Extraocular Movements: Extraocular movements intact.     Conjunctiva/sclera: Conjunctivae normal.     Pupils: Pupils are equal, round, and reactive to light.  Neck:     Musculoskeletal: Normal range of motion and neck supple.  Cardiovascular:     Rate and Rhythm: Normal rate and regular rhythm.     Pulses: Normal pulses.     Heart sounds: Normal heart sounds.  Pulmonary:     Effort: Respiratory distress present.     Breath sounds: Wheezing present.  Chest:     Chest wall: No mass or deformity.  Abdominal:     General: Abdomen is flat. Bowel sounds are normal.     Palpations: Abdomen is soft.  Musculoskeletal: Normal range of motion.  Skin:    General: Skin is warm and dry.     Capillary Refill: Capillary refill takes less than 2 seconds.  Neurological:     General: No focal deficit present.     Mental Status: He is alert and oriented to person, place, and time.  Psychiatric:        Mood and Affect: Mood normal.        Behavior: Behavior normal.         Thought Content: Thought content normal.        Judgment: Judgment normal.      ED Treatments / Results  Labs (all labs ordered are listed, but only abnormal results are displayed) Labs Reviewed  BASIC METABOLIC PANEL - Abnormal; Notable for the following components:      Result Value   Glucose, Bld 119 (*)    All other components within normal limits  CBC WITH DIFFERENTIAL/PLATELET - Abnormal; Notable for the following components:   Eosinophils Absolute 0.8 (*)    All other components within normal limits  TROPONIN I - Abnormal; Notable for the following components:   Troponin I 0.05 (*)    All other components within normal limits  BRAIN NATRIURETIC PEPTIDE    EKG EKG Interpretation  Date/Time:  Monday April 16 2018 09:51:46 EST Ventricular Rate:  124 PR Interval:    QRS Duration: 95 QT Interval:  324 QTC Calculation: 466 R Axis:   80 Text Interpretation:  sinus tachycardia Minimal ST depression, inferior leads Confirmed by Isla Pence [phone]) on 04/16/2018 10:45:09 AM   Radiology Dg Chest Port 1 View  Result Date: 04/16/2018 CLINICAL DATA:  Shortness of breath and cough, wheezing since April 11, 2018 EXAM: PORTABLE CHEST 1 VIEW COMPARISON:  Chest x-ray of July 18, 2016 and chest CT scan of April 05, 2018. FINDINGS: The lungs are mildly hyperinflated. There is no focal infiltrate. The interstitial markings are mildly prominent. The heart and pulmonary vascularity are normal. The mediastinum is normal in width. The bony thorax is unremarkable. IMPRESSION: Mild hyperinflation with mild interstitial prominence may reflect exacerbation of COPD and the patient's smoking history. No alveolar pneumonia nor CHF. Electronically Signed   By: David  Martinique M.D.   On: 04/16/2018 09:56    Procedures Procedures (including critical care time)  Medications Ordered in ED Medications  albuterol (PROVENTIL) (2.5 MG/3ML) 0.083% nebulizer solution 10 mg (10 mg  Nebulization Not Given 04/16/18 0953)  ipratropium (ATROVENT) nebulizer solution 0.5 mg (0.5 mg Nebulization Not Given 04/16/18 0953)  magnesium sulfate IVPB 2 g 50 mL (2 g Intravenous New Bag/Given 04/16/18 1057)  albuterol (PROVENTIL) (2.5 MG/3ML) 0.083% nebulizer solution (10 mg  Given 04/16/18 0934)  ipratropium (ATROVENT) 0.02 % nebulizer solution (0.5 mg  Given 04/16/18 0934)  methylPREDNISolone sodium succinate (SOLU-MEDROL) 125 mg/2 mL injection 125 mg (125 mg Intravenous Given 04/16/18 0945)  aspirin chewable tablet 324 mg (324 mg Oral Given 04/16/18 1053)     Initial Impression / Assessment and Plan / ED Course  I have reviewed the triage vital signs and the nursing notes.  Pertinent labs & imaging results that were available during my care of the patient were reviewed by me and considered in my medical decision making (see chart for details).  CRITICAL CARE Performed by: Isla Pence   Total critical care time: 30 minutes  Critical care time was exclusive of separately billable procedures and treating other patients.  Critical care was necessary to treat or prevent imminent or life-threatening deterioration.  Critical care was time spent personally by me on the following activities: development of treatment plan with patient and/or surrogate as well as nursing, discussions with consultants, evaluation of patient's response to treatment, examination of patient, obtaining history from patient or surrogate, ordering and performing treatments and interventions, ordering and review of laboratory studies, ordering and review of radiographic studies, pulse oximetry and re-evaluation of patient's condition.  Breathing has improved with continuous neb (10 mg albuterol/0.5 mg atrovent) and 125 mg solumedrol, but he is still wheezing and tachypenic.  Another 5 mg albuterol ordered.  No pna on CXR.    Troponin is slightly elevated and atherosclerosis seen on CT chest on 12/5.  Will  give ASA.  BP is now 160/95 now that he can breathe more comfortably.  Pt d/w Dr. Clementeen Graham ([facility]) who will admit pt to telemetry at [facility].  Final Clinical Impressions(s) / ED Diagnoses   Final diagnoses:  COPD exacerbation ([place])  Acute respiratory failure with hypoxia [facility])    ED Discharge Orders    None       Isla Pence, MD 04/16/18 1102

## 2018-04-16 NOTE — H&P (Signed)
TR[facility] H&P   Patient Demographics:    Richard Gates, is a 57 y.o. male  MRN: [id number]   DOB - [birth year]  Admit Date - 04/16/2018  Outpatient Primary MD for the patient is Colon Branch, MD  Referring MD/NP/PA: Dr Gilford Raid  Outpatient Specialists: none  Patient [facility]: [facility]  Chief Complaint  Patient presents with  . Shortness of Breath      HPI:    Richard Gates  is a 57 y.o. male, with history of COPD diagnosed 1 year back, almost 20-pack-year smoking history, history of depression who was seen by his PCP about 2 weeks back and found to have wheezing and prescribed albuterol inhaler.  Patient reports that he quit smoking about 5-6 months ago.  He has never had symptoms of COPD exacerbation or been hospitalized for it.  He was prescribed Symbicort inhaler 1 year back for COPD symptoms which he no longer uses. He was in usual state of health until 3 days ago when he started having shortness of breath initially on exertion and yesterday he was extremely short of breath even at rest, actively wheezing.  He denies any fevers, chills, headache, blurred vision, dizziness, chest pain, palpitations, orthopnea, PND, abdominal pain, dysuria, diarrhea, tingling or numbness of the extremities.  Denies any sick contact, recent illness or travel.  Denies any leg swelling or pain.  Patient was found to be tachycardic up to 130s, tachypneic in the high 20s to 30, blood pressure elevated to 195/126 mmHg and hypoxic with O2 sat 84% on room air, improved >90 on 2 L via nasal cannula.  Blood work showed normal CBC and chemistry with mildly elevated troponin of 0.05.  Glucose of 119.  Chest x-ray showed findings of COPD changes, no infiltrate or effusion.  EKG showed sinus tachycardia at 124 with mild ST depression in inferior leads. Patient given continuous neb, aspirin, IV  Solu-Medrol and magnesium with some improvement.  Hospitalist consulted for admission to telemetry.    Review of systems:    In addition to the HPI above,  No Fever-chills, No Headache, No changes with Vision or hearing, No problems swallowing food or Liquids, No Chest pain, cough with whitish phlegm, shortness of breath + + + No Abdominal pain, No Nausea or vomiting, Bowel movements are regular, No Blood in stool or Urine, No dysuria, No new skin rashes or bruises, No new joints pains-aches,  No new weakness, tingling, numbness in any extremity, No recent weight gain or loss, No polyuria, polydypsia or polyphagia, No significant Mental Stressors.    With Past History of the following :    Past Medical History:  Diagnosis Date  . COPD (chronic obstructive pulmonary disease) ([place])   . GERD (gastroesophageal reflux disease)       Past Surgical History:  Procedure Laterality Date  . APPENDECTOMY  Social History:     Social History   Tobacco Use  . Smoking status: Former Smoker    Last attempt to quit: 07/31/2016    Years since quitting: 1.7  . Smokeless tobacco: Never Used  . Tobacco comment: 1 pack a day from age 28 to 25, now smokes very rarely  Substance Use Topics  . Alcohol use: Yes    Comment: Social     Lives -home  Mobility -independent     Family History :     Family History  Problem Relation Age of Onset  . Stroke Mother   . Hypertension Other   . Heart attack Paternal Uncle   . Breast cancer Sister   . Colon cancer Neg Hx   . Prostate cancer Neg Hx       Home Medications:   Prior to Admission medications   Medication Sig Start Date End Date Taking? Authorizing Provider  Albuterol Sulfate (PROAIR RESPICLICK) 993 (90 Base) MCG/ACT AEPB Inhale 2 puffs into the lungs every 6 (six) hours as needed. 04/03/18  Yes Paz, Alda Berthold, MD  escitalopram (LEXAPRO) 10 MG tablet Take 1 tablet (10 mg total) by mouth at bedtime. 04/03/18   Colon Branch, MD     Allergies:    No Known Allergies   Physical Exam:   Vitals  Blood pressure (!) 153/93, pulse 93, temperature 98.1 F (36.7 C), temperature source Oral, resp. rate (!) 28, height 5\' 9"  (1.753 m), weight 68.3 kg, SpO2 92 %.   General: Middle-aged male lying in bed in no acute distress, able to speak in full sentences HEENT: Pupils reactive bilaterally, EOMI, no pallor, no icterus, moist mucosa, supple neck Chest: Diffuse wheezing bilaterally, no crackles CVs: S1 and S2 tachycardic, no murmurs rub or gallop GI: Soft, nondistended, nontender, bowel sounds present Musculoskeletal: Warm, no edema   Data Review:    CBC Recent Labs  Lab 04/16/18 0943  WBC 7.5  HGB 15.4  HCT 48.1  PLT 335  MCV 95.8  MCH 30.7  MCHC 32.0  RDW 13.2  LYMPHSABS 1.6  MONOABS 0.8  EOSABS 0.8*  BASOSABS 0.1   ------------------------------------------------------------------------------------------------------------------  Chemistries  Recent Labs  Lab 04/16/18 0943  NA 140  K 4.0  CL 99  CO2 32  GLUCOSE 119*  BUN 20  CREATININE 0.78  CALCIUM 10.1   ------------------------------------------------------------------------------------------------------------------ estimated creatinine clearance is 97.2 mL/min (by C-G formula based on SCr of 0.78 mg/dL). ------------------------------------------------------------------------------------------------------------------ No results for input(s): TSH, T4TOTAL, T3FREE, THYROIDAB in the last 72 hours.  Invalid input(s): FREET3  Coagulation profile No results for input(s): INR, PROTIME in the last 168 hours. ------------------------------------------------------------------------------------------------------------------- No results for input(s): DDIMER in the last 72 hours. -------------------------------------------------------------------------------------------------------------------  Cardiac Enzymes Recent Labs  Lab  04/16/18 0931  TROPONINI 0.05*   ------------------------------------------------------------------------------------------------------------------    Component Value Date/Time   BNP 41.0 04/16/2018 0931     ---------------------------------------------------------------------------------------------------------------  Urinalysis No results found for: COLORURINE, APPEARANCEUR, LABSPEC, PHURINE, GLUCOSEU, HGBUR, BILIRUBINUR, KETONESUR, PROTEINUR, UROBILINOGEN, NITRITE, LEUKOCYTESUR  ----------------------------------------------------------------------------------------------------------------   Imaging Results:    Dg Chest Port 1 View  Result Date: 04/16/2018 CLINICAL DATA:  Shortness of breath and cough, wheezing since April 11, 2018 EXAM: PORTABLE CHEST 1 VIEW COMPARISON:  Chest x-ray of July 18, 2016 and chest CT scan of April 05, 2018. FINDINGS: The lungs are mildly hyperinflated. There is no focal infiltrate. The interstitial markings are mildly prominent. The heart and pulmonary vascularity are normal. The mediastinum is normal in width. The  bony thorax is unremarkable. IMPRESSION: Mild hyperinflation with mild interstitial prominence may reflect exacerbation of COPD and the patient's smoking history. No alveolar pneumonia nor CHF. Electronically Signed   By: David  Martinique M.D.   On: 04/16/2018 09:56    My personal review of EKG: Sinus tachycardia at 124, mild ST depression inferiorly  Assessment & Plan:   Principal problem Acute respiratory failure with hypoxia (HCC) COPD with acute exacerbation ([place]) Admit to telemetry.  Requiring 2 L via nasal cannula.  Check ABG.  Placed on IV Solu-Medrol 80 mg every 8 hours.  Scheduled DuoNeb every 6 hours and as needed albuterol neb.  Empiric doxycycline.  Supportive care with Tylenol and antitussives. Patient is only on albuterol inhaler at home and may need prescription for Symbicort. Patient quit smoking about 5 months back and  was encouraged for it.    Active Problems: Tachycardia and mild ST depression inferior lead. Patient denies any chest pain symptoms.  Has mildly elevated troponin.  Cycle enzymes.  Monitor on telemetry.  Will check d-dimer, if elevated check CT angiogram of the chest. Obtain 2D echo.     Elevated blood-pressure reading without diagnosis of hypertension Denies history of hypertension.  Pattern of LVH on EKG.  Placed on PRN hydralazine.  History of depression Continue Lexapro     DVT Prophylaxis: Lovenox  AM Labs Ordered, also please review Full Orders  Family Communication: Admission, patients condition and plan of care including tests being ordered have been discussed with the patient at bedside  Code Status full code  Likely DC to home in the next 48-72 hours if improved  Condition: [place] called: None  Admission status: Inpatient   Time spent in minutes : 70   Tucker Minter M.D on 04/16/2018 at 1:16 PM  Between 7am to 7pm - Pager - [phone]. After 7pm go to [email or web address] - password [facility]  [facility] - Office  [phone]

## 2018-04-16 NOTE — Progress Notes (Signed)
RT note-Patient arrived, initial sp02 84% on room air. O2 placed by RN. MD notifed, CAT initiated 10mg  Albuterol and Atrovent. Continue to monitor.

## 2018-04-17 LAB — BASIC METABOLIC PANEL
Anion gap: 11 (ref 5–15)
BUN: 27 mg/dL — ABNORMAL HIGH (ref 6–20)
CO2: 25 mmol/L (ref 22–32)
Calcium: 9.3 mg/dL (ref 8.9–10.3)
Chloride: 103 mmol/L (ref 98–111)
Creatinine, Ser: 0.84 mg/dL (ref 0.61–1.24)
GFR calc Af Amer: >60 mL/min (ref >60)
GFR calc non Af Amer: >60 mL/min (ref >60)
Glucose, Bld: 148 mg/dL — ABNORMAL HIGH (ref 70–99)
POTASSIUM: 4 mmol/L (ref 3.5–5.1)
Sodium: 139 mmol/L (ref 135–145)

## 2018-04-17 LAB — CBC
HCT: 42.5 % (ref 39.0–52.0)
HEMOGLOBIN: 13.7 g/dL (ref 13.0–17.0)
MCH: 31.4 pg (ref 26.0–34.0)
MCHC: 32.2 g/dL (ref 30.0–36.0)
MCV: 97.3 fL (ref 80.0–100.0)
Platelets: 283 10*3/uL (ref 150–400)
RBC: 4.37 MIL/uL (ref 4.22–5.81)
RDW: 13.2 % (ref 11.5–15.5)
WBC: 8.2 10*3/uL (ref 4.0–10.5)
nRBC: 0 % (ref 0.0–0.2)

## 2018-04-17 LAB — TROPONIN I: Troponin I: 0.03 ng/mL (ref <0.03)

## 2018-04-17 LAB — HIV ANTIBODY (ROUTINE TESTING W REFLEX): HIV Screen 4th Generation wRfx: NONREACTIVE

## 2018-04-17 MED ORDER — IPRATROPIUM-ALBUTEROL 0.5-2.5 (3) MG/3ML IN SOLN
3.0000 mL | Freq: Four times a day (QID) | RESPIRATORY_TRACT | Status: DC
  Administered 2018-04-17 – 2018-04-18 (×6): 3 mL via RESPIRATORY_TRACT
  Filled 2018-04-17 (×6): qty 3

## 2018-04-17 MED ORDER — METHYLPREDNISOLONE SODIUM SUCC 125 MG IJ SOLR
60.0000 mg | Freq: Three times a day (TID) | INTRAMUSCULAR | Status: DC
  Administered 2018-04-17 – 2018-04-18 (×3): 60 mg via INTRAVENOUS
  Filled 2018-04-17 (×3): qty 2

## 2018-04-17 MED ORDER — HYDRALAZINE HCL 20 MG/ML IJ SOLN
10.0000 mg | Freq: Four times a day (QID) | INTRAMUSCULAR | Status: DC | PRN
  Administered 2018-04-18: 10 mg via INTRAVENOUS
  Filled 2018-04-17: qty 1

## 2018-04-17 NOTE — Progress Notes (Signed)
PROGRESS NOTE   CHRLES Gates  XBM:[id number]    DOB: 12-02-1960    DOA: 04/16/2018  PCP: Colon Branch, MD   I have briefly reviewed patients previous medical records in [facility].  Brief Narrative:  57 year old male with PMH of COPD, former smoker quit couple months ago, depression, GERD, who presented to [facility] ED with 3 days history of dyspnea, wheezing which progressively got worse.  In the ED noted to be tachycardic, tachypneic, hypertensive, hypoxic at 84% on room air.  Chest x-ray showed COPD changes.  He was admitted for COPD exacerbation and acute hypoxic respiratory failure.  Improving.   Assessment & Plan:   Active Problems:   Tachycardia   Acute respiratory failure with hypoxia (HCC)   COPD exacerbation (HCC)   Elevated blood-pressure reading without diagnosis of hypertension   Elevated troponin I level   1. COPD exacerbation: Treated in ED with continuous nebulization, IV Solu-Medrol and magnesium.  Continued treatment with IV Solu-Medrol 80 mg every 8 hourly, duo nebs, albuterol nebulizations as needed, oral doxycycline.  Patient had been prescribed Symbicort which he stopped taking more than a year ago.  He had been seen by his PCP approximately 2 weeks back and prescribed an albuterol inhaler.  Clinically improving.  Reduced Solu-Medrol to 60 mg q. 8 hourly.  Flutter valve added. 2. Acute respiratory failure with hypoxia: Secondary to COPD exacerbation.  Improving.  Wean off of oxygen as tolerated. 3. Sinus tachycardia and mild ST depression in inferior leads: No anginal symptoms.  Noted in ED.  Sinus tachycardia likely related to acute respiratory failure and bronchodilators.  EKG changes likely due to sinus tachycardia. Mildly elevated troponin with flat trend likely due to demand.  D-dimer negative.  TTE: LVEF 65-70%.  Wall motion normal and no regional wall motion abnormalities.  Grade 1 diastolic dysfunction.  No further work-up. 4. History of  depression: Stable.  Continue Lexapro. 5. Former tobacco abuse: Quit few months ago. 6. GERD: Stable without symptoms. 7. Bilateral parotid area enlargement: Unclear etiology.  Denies history of heavy alcohol intake.  No other lymphadenopathy on exam.  PCP apparently aware and plans to evaluate, recommended close follow-up.   DVT prophylaxis: Lovenox Code Status: Full Family Communication: None at bedside Disposition: Home pending clinical improvement, hopefully 12/18.  Patient reports that he has a flight booked to go to [place] on Friday to meet his parents for the Christmas holidays   Consultants:  None  Procedures:  None  Antimicrobials:  Doxycycline   Subjective: Feels much better.  Dyspnea significantly improved and almost back to baseline.  Cough now more productive, yellow sputum.  No chest pain, palpitations, dizziness or lightheadedness.  ROS: As above, otherwise negative  Objective:  Vitals:   04/17/18 0841 04/17/18 1137 04/17/18 1314 04/17/18 1530  BP:   (!) 159/75   Pulse:   (!) 116   Resp:   18   Temp:   99.2 F (37.3 C)   TempSrc:   Oral   SpO2: 92% 92% 91% 94%  Weight:      Height:        Examination:  General exam: Pleasant young male, moderately built and nourished, sitting up comfortably in bed. Respiratory system: Slightly harsh breath sounds bilaterally with few expiratory rhonchi.  No crackles. Respiratory effort normal.  Able to speak in full sentences. Cardiovascular system: S1 & S2 heard, RRR. No JVD, murmurs, rubs, gallops or clicks. No pedal edema.  Telemetry personally reviewed:  SR- occasional mild ST in the 110s-120s. Gastrointestinal system: Abdomen is nondistended, soft and nontender. No organomegaly or masses felt. Normal bowel sounds heard. Central nervous system: Alert and oriented. No focal neurological deficits. Extremities: Symmetric 5 x 5 power. Skin: No rashes, lesions or ulcers Psychiatry: Judgement and insight appear normal.  Mood & affect appropriate. HEENT: Bilateral parotid gland area enlargement without acute findings.    Data Reviewed: I have personally reviewed following labs and imaging studies  CBC: Recent Labs  Lab 04/16/18 0943 04/16/18 1328 04/17/18 0119  WBC 7.5 7.7 8.2  NEUTROABS 4.3  --   --   HGB 15.4 15.2 13.7  HCT 48.1 47.8 42.5  MCV 95.8 96.6 97.3  PLT 335 321 492   Basic Metabolic Panel: Recent Labs  Lab 04/16/18 0943 04/16/18 1328 04/17/18 0119  NA 140  --  139  K 4.0  --  4.0  CL 99  --  103  CO2 32  --  25  GLUCOSE 119*  --  148*  BUN 20  --  27*  CREATININE 0.78 0.81 0.84  CALCIUM 10.1  --  9.3   Cardiac Enzymes: Recent Labs  Lab 04/16/18 0931 04/16/18 1328 04/16/18 1912 04/17/18 0119  TROPONINI 0.05* 0.04* 0.04* 0.03*         Radiology Studies: Dg Chest Port 1 View  Result Date: 04/16/2018 CLINICAL DATA:  Shortness of breath and cough, wheezing since April 11, 2018 EXAM: PORTABLE CHEST 1 VIEW COMPARISON:  Chest x-ray of July 18, 2016 and chest CT scan of April 05, 2018. FINDINGS: The lungs are mildly hyperinflated. There is no focal infiltrate. The interstitial markings are mildly prominent. The heart and pulmonary vascularity are normal. The mediastinum is normal in width. The bony thorax is unremarkable. IMPRESSION: Mild hyperinflation with mild interstitial prominence may reflect exacerbation of COPD and the patient's smoking history. No alveolar pneumonia nor CHF. Electronically Signed   By: David  Martinique M.D.   On: 04/16/2018 09:56        Scheduled Meds: . albuterol  10 mg Nebulization Once  . doxycycline  100 mg Oral Q12H  . enoxaparin (LOVENOX) injection  40 mg Subcutaneous Q24H  . escitalopram  10 mg Oral QHS  . guaiFENesin  600 mg Oral BID  . ipratropium  0.5 mg Nebulization Once  . ipratropium-albuterol  3 mL Nebulization QID  . methylPREDNISolone (SOLU-MEDROL) injection  60 mg Intravenous Q8H   Continuous Infusions:   LOS: 1  day     Vernell Leep, MD, FACP, [facility]. [facility] Pager [phone]  If 7PM-7AM, please contact night-coverage [email or web address] Password [facility] 04/17/2018, 4:32 PM

## 2018-04-18 LAB — CBC
HCT: 42.4 % (ref 39.0–52.0)
Hemoglobin: 13.6 g/dL (ref 13.0–17.0)
MCH: 30.6 pg (ref 26.0–34.0)
MCHC: 32.1 g/dL (ref 30.0–36.0)
MCV: 95.3 fL (ref 80.0–100.0)
PLATELETS: 284 10*3/uL (ref 150–400)
RBC: 4.45 MIL/uL (ref 4.22–5.81)
RDW: 13.9 % (ref 11.5–15.5)
WBC: 15.8 10*3/uL — ABNORMAL HIGH (ref 4.0–10.5)
nRBC: 0 % (ref 0.0–0.2)

## 2018-04-18 LAB — BASIC METABOLIC PANEL
Anion gap: 8 (ref 5–15)
BUN: 23 mg/dL — ABNORMAL HIGH (ref 6–20)
CALCIUM: 9.2 mg/dL (ref 8.9–10.3)
CO2: 24 mmol/L (ref 22–32)
Chloride: 108 mmol/L (ref 98–111)
Creatinine, Ser: 0.73 mg/dL (ref 0.61–1.24)
GFR calc Af Amer: >60 mL/min (ref >60)
GFR calc non Af Amer: >60 mL/min (ref >60)
Glucose, Bld: 131 mg/dL — ABNORMAL HIGH (ref 70–99)
Potassium: 4.4 mmol/L (ref 3.5–5.1)
Sodium: 140 mmol/L (ref 135–145)

## 2018-04-18 MED ORDER — DOXYCYCLINE HYCLATE 100 MG PO TABS: 100.0000 mg | ORAL_TABLET | Freq: Two times a day (BID) | ORAL | 0 refills | Status: AC

## 2018-04-18 MED ORDER — UMECLIDINIUM BROMIDE 62.5 MCG/INH IN AEPB: 1.0000 | INHALATION_SPRAY | Freq: Every day | RESPIRATORY_TRACT | 0 refills | Status: DC

## 2018-04-18 MED ORDER — PREDNISONE 10 MG PO TABS: ORAL_TABLET | ORAL | 0 refills | Status: AC

## 2018-04-18 NOTE — Progress Notes (Signed)
Benefit checked for incruse ellipta/1 inhalation 62.46mcg qd-formulary-cost$96.46 for 30days;$175 for 90 days.MD notified.

## 2018-04-18 NOTE — Care Management Note (Signed)
Case Management Note  Patient Details  Name: Richard Gates MRN: [id number] Date of Birth: 19-Apr-1961  Subjective/Objective:                    Action/Plan:   Expected Discharge Date:  (unknown)               Expected Discharge Plan:     In-House Referral:     Discharge planning Services     Post Acute Care Choice:    Choice offered to:     DME Arranged:    DME Agency:     HH Arranged:    [place] Agency:     Status of Service:     If discussed at [employer] of Stay Meetings, dates discussed:    Additional Comments: Per Anthony/ [employer] p#[phone] Spirva/ Tiotropium handihaler is not on formulary and needs prior auth p# [phone] from the Doctor   [place], [place] 04/18/2018, 12:03 PM

## 2018-04-18 NOTE — Progress Notes (Signed)
Patient oxygen saturations dropped to 89% on RA at rest. 2 L [place] reapplied. Will update oncoming nurse.

## 2018-04-18 NOTE — Discharge Summary (Signed)
Physician Discharge Summary  Richard Gates HUT:[id number] DOB: 1960/11/09 DOA: 04/16/2018  PCP: Richard Branch, MD  Admit date: 04/16/2018 Discharge date: 04/18/2018  Time spent: 35 minutes  Recommendations for Outpatient Follow-up:  1. Follow up outpatient CBC/CMP 2. Started on incruse ellipta at discharge, follow up to ensure pt improving with this 3.  Mildly elevated troponin here, consider cards follow up as outpatient 4. Follow bilateral parotid area enlargement as outpatient  Discharge Diagnoses:  Active Problems:   Tachycardia   Acute respiratory failure with hypoxia (HCC)   COPD exacerbation (HCC)   Elevated blood-pressure reading without diagnosis of hypertension   Elevated troponin I level   Discharge Condition: stable  Diet recommendation: heart healthy  Filed Weights   04/16/18 0952 04/16/18 1256  Weight: 70.3 kg 68.3 kg    History of present illness:  57 year old male with PMH of COPD, former smoker quit couple months ago, depression, GERD, who presented to [facility] ED with 3 days history of dyspnea, wheezing which progressively got worse.  In the ED noted to be tachycardic, tachypneic, hypertensive, hypoxic at 84% on room air.  Chest x-ray showed COPD changes.  He was admitted for COPD exacerbation and acute hypoxic respiratory failure.  Improving.  He was admitted for Richard Gates COPD exacerbation and has improved on steroids, antibiotics, and nebs.  He was discharged on 12/18 with steroid taper, abx, and LAMA.  Hospital Course:  1. COPD exacerbation:  1. Improved at discharge, continue steroids, nebs, abx 2. Start LAMA at discharge  2. Acute respiratory failure with hypoxia: Secondary to COPD exacerbation.  improved at discharge, weaned to room air.  Maintained sats with ambulation.  3. Sinus tachycardia and mild ST depression in inferior leads: No anginal symptoms.  Noted in ED.  Sinus tachycardia likely related to acute respiratory failure and  bronchodilators.  EKG changes likely due to sinus tachycardia. Mildly elevated troponin with flat trend likely due to demand.  D-dimer negative.  TTE: LVEF 65-70%.  Wall motion normal and no regional wall motion abnormalities.  Grade 1 diastolic dysfunction.  Follow up as outpatient.  4. History of depression: Stable.  Continue Lexapro.  5. Former tobacco abuse: Quit few months ago.  6. GERD: Stable without symptoms.  7. Bilateral parotid area enlargement: Unclear etiology.  Denies history of heavy alcohol intake.  No other lymphadenopathy on exam.  PCP apparently aware and plans to evaluate, recommended close follow-up.  Procedures: Echo Study Conclusions  - Left ventricle: The cavity size was normal. There was moderate   concentric hypertrophy. Systolic function was vigorous. The   estimated ejection fraction was in the range of 65% to 70%. Wall   motion was normal; there were no regional wall motion   abnormalities. Doppler parameters are consistent with abnormal   left ventricular relaxation (grade 1 diastolic dysfunction). - Aortic valve: Valve area (VTI): 2.77 cm^2. Valve area (Vmax): 2.7   cm^2. Valve area (Vmean): 2.66 cm^2. - Atrial septum: No defect or patent foramen ovale was identified.  Consultations:  none  Discharge Exam: Vitals:   04/18/18 1313 04/18/18 1423  BP: (!) 157/87 (!) 154/95  Pulse: (!) 118 (!) 105  Resp:  (!) 24  Temp: 98.4 F (36.9 C) 98.6 F (37 C)  SpO2: 93% 90%   Wants to go home. Feels much better than at presentation.  General: No acute distress. Cardiovascular: Heart sounds show [facility] regular rate, and rhythm. . Lungs: Clear to auscultation bilaterally with good air movement. Abdomen:  Soft, nontender, nondistended  Neurological: Alert and oriented 3. Moves all extremities 4. Cranial nerves II through XII grossly intact. Skin: Warm and dry. No rashes or lesions. Extremities: No clubbing or cyanosis. No edema.  Psychiatric: Mood and  affect are normal. Insight and judgment are appropriate.  Discharge Instructions   Discharge Instructions    Call MD for:  difficulty breathing, headache or visual disturbances   Complete by:  As directed    Call MD for:  extreme fatigue   Complete by:  As directed    Call MD for:  persistant dizziness or light-headedness   Complete by:  As directed    Call MD for:  persistant nausea and vomiting   Complete by:  As directed    Call MD for:  redness, tenderness, or signs of infection (pain, swelling, redness, odor or green/yellow discharge around incision site)   Complete by:  As directed    Call MD for:  severe uncontrolled pain   Complete by:  As directed    Call MD for:  temperature >100.4   Complete by:  As directed    Diet - low sodium heart healthy   Complete by:  As directed    Discharge instructions   Complete by:  As directed    You were seen for Richard Gates COPD exacerbation.  You've improved on steroids and antibiotics and nebulizers.  We'll continue your antibiotic for another day and Richard Gates half (start tonight).  Continue your albuterol as needed.  I've started Richard Gates new medicine called incruse ellipta.  Take this every day as Richard Gates controller medication.  I'll send you home with [facility] Gates steroid taper.   Return for new, recurrent, or worsening symptoms.  Please ask your PCP to request records from this hospitalization so they know what was done and what the next steps will be.   Increase activity slowly   Complete by:  As directed      Allergies as of 04/18/2018   No Known Allergies     Medication List    TAKE these medications   Albuterol Sulfate 108 (90 Base) MCG/ACT Aepb Commonly known as:  PROAIR RESPICLICK Inhale 2 puffs into the lungs every 6 (six) hours as needed.   doxycycline 100 MG tablet Commonly known as:  VIBRA-TABS Take 1 tablet (100 mg total) by mouth every 12 (twelve) hours for 5 doses. (start first dose tonight)   escitalopram 10 MG tablet Commonly known as:   LEXAPRO Take 1 tablet (10 mg total) by mouth at bedtime.   predniSONE 10 MG tablet Commonly known as:  DELTASONE Take 4 tablets (40 mg total) by mouth daily for 3 days, THEN 3 tablets (30 mg total) daily for 3 days, THEN 2 tablets (20 mg total) daily for 3 days, THEN 1 tablet (10 mg total) daily for 3 days. Start taking on:  April 18, 2018   umeclidinium bromide 62.5 MCG/INH Aepb Commonly known as:  INCRUSE ELLIPTA Inhale 1 puff into the lungs daily.      No Known Allergies Follow-up Information    Richard Branch, MD Follow up.   Specialty:  Internal Medicine Contact information: [phone] [address] [facility] [address] [place] [address] [phone]            The results of significant diagnostics from this hospitalization (including imaging, microbiology, ancillary and laboratory) are listed below for reference.    Significant Diagnostic Studies: Dg Chest Port 1 View  Result Date: 04/16/2018 CLINICAL DATA:  Shortness of breath and cough, wheezing since April 11, 2018 EXAM: PORTABLE CHEST 1 VIEW COMPARISON:  Chest x-ray of July 18, 2016 and chest CT scan of April 05, 2018. FINDINGS: The lungs are mildly hyperinflated. There is no focal infiltrate. The interstitial markings are mildly prominent. The heart and pulmonary vascularity are normal. The mediastinum is normal in width. The bony thorax is unremarkable. IMPRESSION: Mild hyperinflation with mild interstitial prominence may reflect exacerbation of COPD and the patient's smoking history. No alveolar pneumonia nor CHF. Electronically Signed   By: David  Martinique M.D.   On: 04/16/2018 09:56   Ct Chest Lung Ca Screen Low Dose W/o Cm  Result Date: 04/06/2018 CLINICAL DATA:  57 year old asymptomatic male current smoker with 42 pack-year smoking history. EXAM: CT CHEST WITHOUT CONTRAST LOW-DOSE FOR LUNG CANCER SCREENING TECHNIQUE: Multidetector CT imaging of the chest was performed following the standard protocol  without IV contrast. COMPARISON:  07/18/2016 chest radiograph. FINDINGS: Cardiovascular: Normal heart size. No significant pericardial effusion/thickening. Left main coronary atherosclerosis. Atherosclerotic nonaneurysmal thoracic aorta. Normal caliber pulmonary arteries. Mediastinum/Nodes: No discrete thyroid nodules. Unremarkable esophagus. No pathologically enlarged axillary, mediastinal or hilar lymph nodes, noting limited sensitivity for the detection of hilar adenopathy on this noncontrast study. Lungs/Pleura: No pneumothorax. No pleural effusion. Mild centrilobular and paraseptal emphysema with diffuse bronchial wall thickening. No acute consolidative airspace disease or lung masses. Tiny calcified dependent basilar left lower lobe granuloma. No significant pulmonary nodules. Upper abdomen: No acute abnormality. Musculoskeletal: No aggressive appearing focal osseous lesions. Mild thoracic spondylosis. IMPRESSION: 1. Lung-RADS 1, negative. Continue annual screening with low-dose chest CT without contrast in 12 months. 2. Left main coronary atherosclerosis. Aortic Atherosclerosis (ICD10-I70.0) and Emphysema (ICD10-J43.9). Electronically Signed   By: Ilona Sorrel M.D.   On: 04/06/2018 09:02    Microbiology: No results found for this or any previous visit (from the past 240 hour(s)).   Labs: Basic Metabolic Panel: Recent Labs  Lab 04/16/18 0943 04/16/18 1328 04/17/18 0119 04/18/18 0836  NA 140  --  139 140  K 4.0  --  4.0 4.4  CL 99  --  103 108  CO2 32  --  25 24  GLUCOSE 119*  --  148* 131*  BUN 20  --  27* 23*  CREATININE 0.78 0.81 0.84 0.73  CALCIUM 10.1  --  9.3 9.2   Liver Function Tests: No results for input(s): AST, ALT, ALKPHOS, BILITOT, PROT, ALBUMIN in the last 168 hours. No results for input(s): LIPASE, AMYLASE in the last 168 hours. No results for input(s): AMMONIA in the last 168 hours. CBC: Recent Labs  Lab 04/16/18 0943 04/16/18 1328 04/17/18 0119 04/18/18 0836   WBC 7.5 7.7 8.2 15.8*  NEUTROABS 4.3  --   --   --   HGB 15.4 15.2 13.7 13.6  HCT 48.1 47.8 42.5 42.4  MCV 95.8 96.6 97.3 95.3  PLT 335 321 283 284   Cardiac Enzymes: Recent Labs  Lab 04/16/18 0931 04/16/18 1328 04/16/18 1912 04/17/18 0119  TROPONINI 0.05* 0.04* 0.04* 0.03*   BNP: BNP (last 3 results) Recent Labs    04/16/18 0931  BNP 41.0    ProBNP (last 3 results) No results for input(s): PROBNP in the last 8760 hours.  CBG: No results for input(s): GLUCAP in the last 168 hours.     Signed:  Fayrene Helper MD.  [facility] 04/18/2018, 2:26 PM

## 2018-04-20 ENCOUNTER — Telehealth: Payer: Self-pay

## 2018-04-20 MED ORDER — TIOTROPIUM BROMIDE MONOHYDRATE 1.25 MCG/ACT IN AERS: 1.0000 | INHALATION_SPRAY | Freq: Every day | RESPIRATORY_TRACT | 1 refills | Status: DC

## 2018-04-20 NOTE — Telephone Encounter (Signed)
Transition Care Management Follow-up Telephone Call  ADMISSION DATE: 04/16/18   DISCHARGE DATE: 04/18/18  NEEDS CBC AND CMP COLLECTED   How have you been since you were released from the hospital?  Patient states he is feeling ok.  Do you understand why you were in the hospital? Yes   Do you understand the discharge instrcutions? Yes  Items Reviewed:  Medications reviewed: Yes  Allergies reviewed: NKDA   Dietary changes reviewed: Regular Heart Healthy   Referrals reviewed: Appointment scheduled   Functional Questionnaire:   Activities of Daily Living (ADLs): Patient states he can perform all independently.  Any patient concerns? Incruse Elipta is too expensive for him.   Confirmed importance and date/time of follow-up visits scheduled: YES   Confirmed with patient if condition begins to worsen call PCP or go to the ER. YES   Patient was given the office number and encouragred to call back with questions or concerns. YES

## 2018-04-20 NOTE — Telephone Encounter (Signed)
If he is unable to take get Incruse Ellipta, send Spiriva:  1 puff daily.

## 2018-04-20 NOTE — Addendum Note (Signed)
Addended by: Bunnie Domino on: 04/20/2018 01:13 PM   Modules accepted: Orders

## 2018-05-04 ENCOUNTER — Ambulatory Visit: Payer: 59 | Admitting: Internal Medicine

## 2018-05-09 ENCOUNTER — Telehealth: Payer: Self-pay

## 2018-05-09 ENCOUNTER — Ambulatory Visit: Payer: 59 | Admitting: Internal Medicine

## 2018-05-09 NOTE — Telephone Encounter (Signed)
Copied from [facility] [id number]. Topic: General - Inquiry >> May 09, 2018  2:54 PM Rayann Heman wrote: Reason for CRM: jetzebel calling from cover my med wanted to know if we received a PA for a medication Tiotropium Bromide Monohydrate (SPIRIVA RESPIMAT) 1.25 MCG/ACT AERS [[id number]] Ref#  [id number]

## 2018-05-09 NOTE — Addendum Note (Signed)
Addended byDamita Dunnings D on: 05/09/2018 03:06 PM   Modules accepted: Orders

## 2018-05-09 NOTE — Telephone Encounter (Signed)
Medication not covered by Pt's plan- switched to Incruse Ellipta.

## 2018-05-11 ENCOUNTER — Encounter: Payer: Self-pay | Admitting: Internal Medicine

## 2018-05-11 ENCOUNTER — Ambulatory Visit: Payer: 59 | Admitting: Internal Medicine

## 2018-05-11 VITALS — BP 158/84 | HR 102 | Temp 98.2°F | Resp 16 | Ht 69.0 in | Wt 166.1 lb

## 2018-05-11 DIAGNOSIS — J9601 Acute respiratory failure with hypoxia: Secondary | ICD-10-CM | POA: Diagnosis not present

## 2018-05-11 DIAGNOSIS — Z23 Encounter for immunization: Secondary | ICD-10-CM

## 2018-05-11 DIAGNOSIS — J441 Chronic obstructive pulmonary disease with (acute) exacerbation: Secondary | ICD-10-CM | POA: Diagnosis not present

## 2018-05-11 MED ORDER — UMECLIDINIUM BROMIDE 62.5 MCG/INH IN AEPB: 1.0000 | INHALATION_SPRAY | Freq: Every day | RESPIRATORY_TRACT | 12 refills | Status: DC

## 2018-05-11 MED ORDER — BUDESONIDE-FORMOTEROL FUMARATE 80-4.5 MCG/ACT IN AERO: 2.0000 | INHALATION_SPRAY | Freq: Two times a day (BID) | RESPIRATORY_TRACT | 3 refills | Status: DC

## 2018-05-11 NOTE — Progress Notes (Signed)
Subjective:    Patient ID: Richard Gates, male    DOB: July 02, 1960, 58 y.o.   MRN: [id number]  DOS:  05/11/2018 Type of visit - description: Hospital follow-up  Admitted to the hospital last month, discharged 04/18/2018 Presented with 3-day history of shortness of breath, wheezing, he was tachycardic, tachypneic, hypoxic, O2 sat 84% on room air. Chest x-ray with COPD changes, admitted for acute respiratory hypoxic failure.  Had sinus tachycardia mild elevated troponin with a flat trend.  Felt to be from demand ischemia.  TTE: No regional wall motion abnormalities ] Review of Systems Since he left the hospital, he finished doxycycline and prednisone. He is using albuterol as needed and Incruse Ellipta which was very expensive and he will not be able to get again. Cough has definitely decreased. Reports no fever or chills. No chest pain. No lower extremity edema except when he went to [place] for Christmas had bilateral lower extremity edema which is now resolved.  No calf pain No nausea, vomiting, diarrhea.  Past Medical History:  Diagnosis Date  . COPD (chronic obstructive pulmonary disease) ([place])   . GERD (gastroesophageal reflux disease)     Past Surgical History:  Procedure Laterality Date  . APPENDECTOMY      Social History   Socioeconomic History  . Marital status: Married    Spouse name: Not on file  . Number of children: 1  . Years of education: Not on file  . Highest education level: Not on file  Occupational History  . Occupation: Print production planner, order processing   Social Needs  . Financial resource strain: Not on file  . Food insecurity:    Worry: Not on file    Inability: Not on file  . Transportation needs:    Medical: Not on file    Non-medical: Not on file  Tobacco Use  . Smoking status: Former Smoker    Last attempt to quit: 07/31/2016    Years since quitting: 1.7  . Smokeless tobacco: Never Used  . Tobacco comment: off tobacco as off 04/2018; h/o  1 pack a day from age 35 to 7,  Substance and Sexual Activity  . Alcohol use: Yes    Comment: Social  . Drug use: No  . Sexual activity: Not on file  Lifestyle  . Physical activity:    Days per week: Not on file    Minutes per session: Not on file  . Stress: Not on file  Relationships  . Social connections:    Talks on phone: Not on file    Gets together: Not on file    Attends religious service: Not on file    Active member of club or organization: Not on file    Attends meetings of clubs or organizations: Not on file    Relationship status: Not on file  . Intimate partner violence:    Fear of current or ex partner: Not on file    Emotionally abused: Not on file    Physically abused: Not on file    Forced sexual activity: Not on file  Other Topics Concern  . Not on file  Social History Narrative   Household- pt and wife   1 daughter    4 g-children   1 g-g child      Allergies as of 05/11/2018   No Known Allergies     Medication List       Accurate as of May 11, 2018 11:59 PM. Always use your most recent  med list.        Albuterol Sulfate 108 (90 Base) MCG/ACT Aepb Commonly known as:  PROAIR RESPICLICK Inhale 2 puffs into the lungs every 6 (six) hours as needed.   budesonide-formoterol 80-4.5 MCG/ACT inhaler Commonly known as:  SYMBICORT Inhale 2 puffs into the lungs 2 (two) times daily.           Objective:   Physical Exam BP (!) 158/84 (BP Location: Left Arm, Patient Position: Sitting, Cuff Size: Small)   Pulse (!) 102   Temp 98.2 F (36.8 C) (Oral)   Resp 16   Ht 5\' 9"  (1.753 m)   Wt 166 lb 2 oz (75.4 kg)   SpO2 96%   BMI 24.53 kg/m  General:   Well developed, NAD, BMI noted. HEENT:  Normocephalic . Face symmetric, atraumatic Parotid glands: Quite noticeable but soft, not tender, not nodular. Lungs:  Minimal end expiratory wheezing noted.  No rhonchi or crackles. Normal respiratory effort, no intercostal retractions, no accessory  muscle use. Heart: RRR,  no murmur.  No pretibial edema bilaterally  Skin: Not pale. Not jaundice Neurologic:  alert & oriented X3.  Speech normal, gait appropriate for age and unassisted Psych--  Cognition and judgment appear intact.  Cooperative with normal attention span and concentration.  Behavior appropriate. No anxious or depressed appearing.      Assessment     Assessment GERD Allergies , nasal  COPD, PFTs 2013showed ratio of 58, FEV1 of 1.78- 52% without significant bronchodilator response, TLC was 77% with DLCO 78% Smoker  PLAN: Acute respiratory failure with hypoxia in the context of COPD: He was discharged from the hospital with steroids, antibiotic. Was also prescribed Spiriva but he was very expensive so he was eventually able to afford Incruse Ellipta but he won't be able to do that again $$. He has albuterol. O2 sat today is 96% Plan: CMP, CBC as recommended by the hospital team.  If LAMA are that expensive for the patient, will recommend Symbicort, see RX   Recommend to use a coupon. Tobacco: Reports he is not a smoking at the present time. Tachycardia with elevated troponin : noted during the admission, felt to be demand ischemia.  On chart review, CT chest for lung cancer screening show calcification at the left main coronary artery.  That is of concern to me, already referred to cardiology, advised patient to call the cardiology office  Anxiety: See last visit, tried Lexapro temporarily, not needed anymore. Prominent parotid glands: Seems slightly smaller on today's exam. Flu shot today per pt request RTC 3 months

## 2018-05-11 NOTE — Patient Instructions (Addendum)
  GO TO THE LAB : Get the blood work     GO TO THE FRONT DESK Schedule your next appointment for a checkup in 3 months  Once you are finish  Incruse Ellipta, start Symbicort 2 puffs twice a day. If unable to afford let me know Continue using albuterol as needed for cough and wheezing.   Please call cardiology- [phone] to set up an appointment.

## 2018-05-11 NOTE — Progress Notes (Signed)
Pre visit review using our clinic review tool, if applicable. No additional management support is needed unless otherwise documented below in the visit note. 

## 2018-05-12 LAB — CBC WITH DIFFERENTIAL/PLATELET
Absolute Monocytes: 721 cells/uL (ref 200–950)
BASOS ABS: 21 {cells}/uL (ref 0–200)
Basophils Relative: 0.3 %
Eosinophils Absolute: 371 cells/uL (ref 15–500)
Eosinophils Relative: 5.3 %
HCT: 38.1 % — ABNORMAL LOW (ref 38.5–50.0)
Hemoglobin: 12.5 g/dL — ABNORMAL LOW (ref 13.2–17.1)
Lymphs Abs: 1603 cells/uL (ref 850–3900)
MCH: 30.6 pg (ref 27.0–33.0)
MCHC: 32.8 g/dL (ref 32.0–36.0)
MCV: 93.4 fL (ref 80.0–100.0)
MPV: 9.8 fL (ref 7.5–12.5)
Monocytes Relative: 10.3 %
Neutro Abs: 4284 cells/uL (ref 1500–7800)
Neutrophils Relative %: 61.2 %
Platelets: 365 10*3/uL (ref 140–400)
RBC: 4.08 10*6/uL — ABNORMAL LOW (ref 4.20–5.80)
RDW: 14 % (ref 11.0–15.0)
Total Lymphocyte: 22.9 %
WBC: 7 10*3/uL (ref 3.8–10.8)

## 2018-05-12 LAB — COMPREHENSIVE METABOLIC PANEL
AG Ratio: 1.7 (calc) (ref 1.0–2.5)
ALT: 18 U/L (ref 9–46)
AST: 17 U/L (ref 10–35)
Albumin: 4.2 g/dL (ref 3.6–5.1)
Alkaline phosphatase (APISO): 74 U/L (ref 40–115)
BUN: 19 mg/dL (ref 7–25)
CO2: 26 mmol/L (ref 20–32)
Calcium: 9.7 mg/dL (ref 8.6–10.3)
Chloride: 106 mmol/L (ref 98–110)
Creat: 0.85 mg/dL (ref 0.70–1.33)
GLUCOSE: 93 mg/dL (ref 65–99)
Globulin: 2.5 g/dL (calc) (ref 1.9–3.7)
Potassium: 4.2 mmol/L (ref 3.5–5.3)
SODIUM: 143 mmol/L (ref 135–146)
Total Bilirubin: 0.3 mg/dL (ref 0.2–1.2)
Total Protein: 6.7 g/dL (ref 6.1–8.1)

## 2018-05-12 NOTE — Assessment & Plan Note (Signed)
Acute respiratory failure with hypoxia in the context of COPD: He was discharged from the hospital with steroids, antibiotic. Was also prescribed Spiriva but he was very expensive so he was eventually able to afford Incruse Ellipta but he won't be able to do that again $$. He has albuterol. O2 sat today is 96% Plan: CMP, CBC as recommended by the hospital team.  If LAMA are that expensive for the patient, will recommend Symbicort, see RX   Recommend to use a coupon. Tobacco: Reports he is not a smoking at the present time. Tachycardia with elevated troponin : noted during the admission, felt to be demand ischemia.  On chart review, CT chest for lung cancer screening show calcification at the left main coronary artery.  That is of concern to me, already referred to cardiology, advised patient to call the cardiology office  Anxiety: See last visit, tried Lexapro temporarily, not needed anymore. Prominent parotid glands: Seems slightly smaller on today's exam. Flu shot today per pt request RTC 3 months

## 2018-06-15 ENCOUNTER — Encounter: Payer: Self-pay | Admitting: Cardiology

## 2018-06-15 ENCOUNTER — Ambulatory Visit: Payer: 59 | Admitting: Cardiology

## 2018-06-15 VITALS — BP 130/75 | HR 96 | Ht 69.0 in | Wt 167.6 lb

## 2018-06-15 DIAGNOSIS — I2510 Atherosclerotic heart disease of native coronary artery without angina pectoris: Secondary | ICD-10-CM

## 2018-06-15 DIAGNOSIS — Z01818 Encounter for other preprocedural examination: Secondary | ICD-10-CM

## 2018-06-15 DIAGNOSIS — R0609 Other forms of dyspnea: Secondary | ICD-10-CM

## 2018-06-15 DIAGNOSIS — R7989 Other specified abnormal findings of blood chemistry: Secondary | ICD-10-CM

## 2018-06-15 DIAGNOSIS — R778 Other specified abnormalities of plasma proteins: Secondary | ICD-10-CM

## 2018-06-15 MED ORDER — METOPROLOL TARTRATE 50 MG PO TABS: 100.0000 mg | ORAL_TABLET | Freq: Once | ORAL | 0 refills | Status: DC

## 2018-06-15 MED ORDER — ROSUVASTATIN CALCIUM 20 MG PO TABS: 20.0000 mg | ORAL_TABLET | Freq: Every day | ORAL | 3 refills | Status: DC

## 2018-06-15 NOTE — Progress Notes (Signed)
PCP: Colon Branch, MD  Clinic Note: Chief Complaint  Patient presents with  . New Patient (Initial Visit)    Coronary artery calcification on CT scan (left main).  Dyspnea on exertion.    HPI: Richard Gates is a 58 y.o. male smoker with COPD who is being seen today for the evaluation of (LEFT MAIN) CORONARY ARTERY CALCIFICATION/ATHEROSCLEROSIS noted on SCREENING CHEST CT SCAN.  He is being seen today at the request of Colon Branch, MD (in response to findings during recent hospitalization).  Recent Hospitalizations:   04/17/2019: COPD exacerbation. Trivial troponin elevation (0.04).  Noted to be short of breath, with wheezing.  Tachypneic and hypoxic with oxygen saturations of 84%.  Admitted for acute hypoxic respiratory failure.   Studies Personally Reviewed - (if available, images/films reviewed: From Epic Chart or Care Everywhere)  Echo 04/2018: EF 65-70% (vigorous). No RWMA.  Gr 1 DD.  Normal valves.   Chest CT (lung cancer screening): Lung-RADS 1.  Left main coronary atherosclerosis.  Atherosclerotic nonaneurysmal thoracic aorta  Richard Gates was last seen on on May 12, 2018 by Dr. Larose Kells in follow-up from his recent hospitalization for COPD exacerbation.  Cough is notably improved.  No fevers or chills.  No edema except for when he traveled to [place] (sitting for long period time).  Subsequently resolved.  Interval History: Richard Gates presents here today presumably to discuss the results of his screening chest CT showing left main atherosclerosis.  Ossie does have some baseline exertional dyspnea but it is notably improved since his hospitalization.  He has not had any chest pain or pressure with rest or exertion.  He denies any resting dyspnea except when he had his exacerbation.  No PND, orthopnea, and edema is no longer present.  This was dependent edema after long trip.  He denies any rapid irregular heartbeats, palpitations or skipped beats.  No syncope/near syncope or  TIA/amaurosis fugax.  No claudication.   He really does not have a diagnosis of hypertension, but does have at least dyslipidemia with an LDL of 108 and triglycerides of 217 with drug cholesterol 209 based on most recent check.  Is not currently on a statin.  PAD Screen 06/17/2018  Previous PAD dx? No  Previous surgical procedure? No  Pain with walking? No  Feet/toe relief with dangling? No  Painful, non-healing ulcers? No  Extremities discolored? No    ROS: A comprehensive was performed. Review of Systems  Constitutional: Negative for chills, fever, malaise/fatigue (Started to get energy back) and weight loss.       Pretty much complete recovery from recent hospitalization  HENT: Negative for congestion and nosebleeds.   Respiratory: Positive for cough (Still has mild cough, but notably improved), shortness of breath (Mild baseline dyspnea) and wheezing (Again notably improved on current meds). Negative for sputum production.   Gastrointestinal: Negative for abdominal pain, blood in stool, heartburn and melena.  Genitourinary: Negative for hematuria.  Musculoskeletal: Negative for back pain, falls, joint pain and neck pain.  Neurological: Negative for dizziness, focal weakness and weakness.  Psychiatric/Behavioral: Negative for depression and memory loss. The patient does not have insomnia.    I have reviewed and (if needed) personally updated the patient's problem list, medications, allergies, past medical and surgical history, social and family history.   Past Medical History:  Diagnosis Date  . COPD (chronic obstructive pulmonary disease) ([place])   . GERD (gastroesophageal reflux disease)     Past Surgical History:  Procedure Laterality Date  .  APPENDECTOMY    . TRANSTHORACIC ECHOCARDIOGRAM  04/2018   EF 65-70% (vigorous). No RWMA.  Gr 1 DD.  Normal valves.  (In setting of COPD exacerbation)    Current Meds  Medication Sig  . Albuterol Sulfate (PROAIR RESPICLICK) 950 (90  Base) MCG/ACT AEPB Inhale 2 puffs into the lungs every 6 (six) hours as needed.  . budesonide-formoterol (SYMBICORT) 80-4.5 MCG/ACT inhaler Inhale 2 puffs into the lungs 2 (two) times daily.    No Known Allergies  Social History   Tobacco Use  . Smoking status: Former Smoker    Last attempt to quit: 07/31/2016    Years since quitting: 1.8  . Smokeless tobacco: Never Used  . Tobacco comment: off tobacco as off 04/2018; h/o 1 pack a day from age 2 to 55,  Substance Use Topics  . Alcohol use: Yes    Comment: Social  . Drug use: No   Social History   Social History Narrative   Household- pt and wife   1 daughter    4 g-children   1 g-g child    family history includes Breast cancer in his sister; Heart attack in his maternal uncle; Heart disease in his paternal aunt and paternal uncle; Hypertension in an other family member; Stroke in his father.  Wt Readings from Last 3 Encounters:  06/15/18 167 lb 9.6 oz (76 kg)  05/11/18 166 lb 2 oz (75.4 kg)  04/16/18 150 lb 9.6 oz (68.3 kg)    PHYSICAL EXAM BP 130/75   Pulse 96   Ht 5\' 9"  (1.753 m)   Wt 167 lb 9.6 oz (76 kg)   BMI 24.75 kg/m  Physical Exam  Constitutional: He appears well-developed and well-nourished. No distress.  Healthy-appearing.  Well-groomed.    HENT:  Head: Normocephalic and atraumatic.  Mouth/Throat: Oropharynx is clear and moist.  Eyes: EOM are normal. No scleral icterus.  Neck: Normal range of motion. Neck supple. No hepatojugular reflux and no JVD present. Carotid bruit is not present.  Cardiovascular: Normal rate, regular rhythm, S1 normal, S2 normal, intact distal pulses and normal pulses.  No extrasystoles are present. PMI is not displaced. Exam reveals distant heart sounds. Exam reveals no gallop and no friction rub.  No murmur heard. Lymphadenopathy:    He has no cervical adenopathy.  Vitals reviewed.    Adult ECG Report  Rate: 96 ;  Rhythm: normal sinus rhythm and LVH with repolarization  changes.  Cannot exclude septal MI, age undetermined.;   Narrative Interpretation: Relatively normal EKG.   Other studies Reviewed: Additional studies/ records that were reviewed today include:  Recent Labs:   Lab Results  Component Value Date   CHOL 209 (H) 04/03/2018   HDL 72.00 04/03/2018   LDLCALC 91 05/17/2011   LDLDIRECT 108.0 04/03/2018   TRIG 217.0 (H) 04/03/2018   CHOLHDL 3 04/03/2018   Lab Results  Component Value Date   CREATININE 0.85 05/11/2018   BUN 19 05/11/2018   NA 143 05/11/2018   K 4.2 05/11/2018   CL 106 05/11/2018   CO2 26 05/11/2018   Lab Results  Component Value Date   HGBA1C 5.9 04/03/2018    ASSESSMENT / PLAN: Problem List Items Addressed This Visit    Coronary artery calcification seen on computed tomography    We do not have a clear estimation of coronary disease, but there is definite calcification noted on left main.  Plan: Start statin, check coronary CT angiogram. -Recommend aspirin 81 mg daily.  Relevant Medications   rosuvastatin (CRESTOR) 20 MG tablet   Other Relevant Orders   EKG 12-Lead   CT CORONARY MORPH W/CTA COR W/SCORE W/CA W/CM &/OR WO/CM   CT CORONARY FRACTIONAL FLOW RESERVE DATA PREP   CT CORONARY FRACTIONAL FLOW RESERVE FLUID ANALYSIS   DOE (dyspnea on exertion)    Probably related to COPD, however cannot exclude coronary ischemia.  Given data at hand, would not be related to CHF unless maybe is mild diastolic dysfunction. Plan: Evaluate for ischemic component with Coronary CTA.      Relevant Orders   EKG 12-Lead   CT CORONARY MORPH W/CTA COR W/SCORE W/CA W/CM &/OR WO/CM   CT CORONARY FRACTIONAL FLOW RESERVE DATA PREP   CT CORONARY FRACTIONAL FLOW RESERVE FLUID ANALYSIS   Elevated troponin I level    Very low troponin elevation in the hospital.  I think this could have simply been because of hypoxic respiratory failure, however the presence of left main calcification would argue the potential for mild demand  ischemia.  Another reason for being concerned enough to check Coronary Calcium Score and Coronary CTA      Relevant Orders   EKG 12-Lead   CT CORONARY MORPH W/CTA COR W/SCORE W/CA W/CM &/OR WO/CM   CT CORONARY FRACTIONAL FLOW RESERVE DATA PREP   CT CORONARY FRACTIONAL FLOW RESERVE FLUID ANALYSIS   Left main coronary artery disease - Primary    Somewhat concerning for having left main calcification on CT scan.  This could be minimal versus severe.  Need to quantify based on the severity of potential left main disease.  Plan: Coronary CT angiogram. As a clear marker of CAD, will start statin, and recommend aspirin 81 mg.      Relevant Medications   rosuvastatin (CRESTOR) 20 MG tablet   Other Relevant Orders   CT CORONARY MORPH W/CTA COR W/SCORE W/CA W/CM &/OR WO/CM   CT CORONARY FRACTIONAL FLOW RESERVE DATA PREP   CT CORONARY FRACTIONAL FLOW RESERVE FLUID ANALYSIS    Other Visit Diagnoses    Pre-op testing       Relevant Orders   Basic metabolic panel      I spent a total of 40 minutes with the patient and chart review. >  50% of the time was spent in direct patient consultation.  A majority of the time was used to explain pathophysiology of atherosclerosis, coronary calcification and what that means.  We discussed the importance of left main disease as opposed to other coronary artery beds.  Several clinic visits and studies were reviewed as well as hospital H&P and discharge summary.  Current medicines are reviewed at length with the patient today.  (+/- concerns) none The following changes have been made:  See below.  Start rosuvastatin  Patient Instructions  Medication Instructions:   START TAKING ROSUVASTATIN 20 MG ONE TABLET DAILY   SEE ACCOMPANYING INSTRUCTION SHEET  If you need a refill on your cardiac medications before your next appointment, please call your pharmacy.   Lab work: SEE ACCOMPANYING INSTRUCTION SHEET  If you have labs (blood work) drawn today and  your tests are completely normal, you will receive your results only by: Marland Kitchen MyChart Message (if you have MyChart) OR . A paper copy in the mail If you have any lab test that is abnormal or we need to change your treatment, we will call you to review the results.  Testing/Procedures: WILL BE SCHEDULE AT [place] AUTHORIZATION IS OBTAINED-  Your physician has requested that you have CORONARY  cardiac CTA. Cardiac computed tomography (CT) is a painless test that uses an x-ray machine to take clear, detailed pictures of your heart. For further information please visit [facility]ugeFiesta[facility][email or web address]. Please follow instruction sheet as given.     Follow-Up: At [facility], you and your health needs are our priority.  As part of our continuing mission to provide you with exceptional heart care, we have created designated Provider Care Teams.  These Care Teams include your primary Cardiologist (physician) and Advanced Practice Providers (APPs -  Physician Assistants and Nurse Practitioners) who all work together to provide you with the care you need, when you need it. . Your physician recommends that you schedule a follow-up appointment in 2 [place]. .   Any Other Special Instructions Will Be Listed Below (If Applicable).     INSTRUCTIONS FOR  CORONARY CTA    Please arrive at the [facility] main entrance of [facility] at (30-45 minutes prior to test start time)  [facility] [place], [place] [address] [phone]  Proceed to the [facility] Radiology Department (First Floor).  Please follow these instructions carefully (unless otherwise directed):  PLEASE HAVE LABS - BMP  AT LEAST ONE WEEK PRIOR TO TEST  On the Night Before the Test: . Drink plenty of water. . Do not consume any caffeinated/decaffeinated beverages or chocolate 12 hours prior to your test. . Do not take any antihistamines 12 hours prior to  your test.    On the Day of the Test: . Drink plenty of water. Do not drink any water within one hour of the test. . Do not eat any food 4 hours prior to the test. . You may take your regular medications prior to the test. .  Take 100 mg of lopressor (metoprolol) TWO hour before the test.   After the Test: . Drink plenty of water. . After receiving IV contrast, you may experience a mild flushed feeling. This is normal. . On occasion, you may experience a mild rash up to 24 hours after the test. This is not dangerous. If this occurs, you can take Benadryl 25 mg and increase your fluid intake. . If you experience trouble breathing, this can be serious. If it is severe call 911 IMMEDIATELY. If it is mild, please call our office.             Studies Ordered:   Orders Placed This Encounter  Procedures  . CT CORONARY MORPH W/CTA COR W/SCORE W/CA W/CM &/OR WO/CM  . CT CORONARY FRACTIONAL FLOW RESERVE DATA PREP  . CT CORONARY FRACTIONAL FLOW RESERVE FLUID ANALYSIS  . Basic metabolic panel  . EKG 12-Lead      Glenetta Hew, M.D., M.S. Interventional Cardiologist   Pager # [phone] Phone # [phone] [address] [place], [place] [address]   Thank you for choosing Heartcare at [facility]!!

## 2018-06-15 NOTE — Patient Instructions (Addendum)
Medication Instructions:   START TAKING ROSUVASTATIN 20 MG ONE TABLET DAILY   SEE ACCOMPANYING INSTRUCTION SHEET  If you need a refill on your cardiac medications before your next appointment, please call your pharmacy.   Lab work: SEE ACCOMPANYING INSTRUCTION SHEET  If you have labs (blood work) drawn today and your tests are completely normal, you will receive your results only by: Marland Kitchen MyChart Message (if you have MyChart) OR . A paper copy in the mail If you have any lab test that is abnormal or we need to change your treatment, we will call you to review the results.  Testing/Procedures: WILL BE SCHEDULE AT [place] IS OBTAINED-  Your physician has requested that you have CORONARY  cardiac CTA. Cardiac computed tomography (CT) is a painless test that uses an x-ray machine to take clear, detailed pictures of your heart. For further information please visit [facility]uge[facility]iesta[facility][email or web address]. Please follow instruction sheet as given.     Follow-Up: At [facility], you and your health needs are our priority.  As part of our continuing mission to provide you with exceptional heart care, we have created designated Provider Care Teams.  These Care Teams include your primary Cardiologist (physician) and Advanced Practice Providers (APPs -  Physician Assistants and Nurse Practitioners) who all work together to provide you with the care you need, when you need it. . Your physician recommends that you schedule a follow-up appointment in 2 [place]. .   Any Other Special Instructions Will Be Listed Below (If Applicable).     INSTRUCTIONS FOR  CORONARY CTA    Please arrive at the [facility] main entrance of [facility] at (30-45 minutes prior to test start time)  [facility], [place] [address] [phone]  Proceed to the [facility] Radiology Department (First Floor).  Please follow these  instructions carefully (unless otherwise directed):  PLEASE HAVE LABS - BMP  AT LEAST ONE WEEK PRIOR TO TEST  On the Night Before the Test: . Drink plenty of water. . Do not consume any caffeinated/decaffeinated beverages or chocolate 12 hours prior to your test. . Do not take any antihistamines 12 hours prior to your test.    On the Day of the Test: . Drink plenty of water. Do not drink any water within one hour of the test. . Do not eat any food 4 hours prior to the test. . You may take your regular medications prior to the test. .  Take 100 mg of lopressor (metoprolol) TWO hour before the test.   After the Test: . Drink plenty of water. . After receiving IV contrast, you may experience a mild flushed feeling. This is normal. . On occasion, you may experience a mild rash up to 24 hours after the test. This is not dangerous. If this occurs, you can take Benadryl 25 mg and increase your fluid intake. . If you experience trouble breathing, this can be serious. If it is severe call 911 IMMEDIATELY. If it is mild, please call our office.

## 2018-06-17 ENCOUNTER — Encounter: Payer: Self-pay | Admitting: Cardiology

## 2018-06-17 NOTE — Assessment & Plan Note (Signed)
Probably related to COPD, however cannot exclude coronary ischemia.  Given data at hand, would not be related to CHF unless maybe is mild diastolic dysfunction. Plan: Evaluate for ischemic component with Coronary CTA.

## 2018-06-17 NOTE — Assessment & Plan Note (Signed)
Somewhat concerning for having left main calcification on CT scan.  This could be minimal versus severe.  Need to quantify based on the severity of potential left main disease.  Plan: Coronary CT angiogram. As a clear marker of CAD, will start statin, and recommend aspirin 81 mg.

## 2018-06-17 NOTE — Assessment & Plan Note (Signed)
We do not have a clear estimation of coronary disease, but there is definite calcification noted on left main.  Plan: Start statin, check coronary CT angiogram. -Recommend aspirin 81 mg daily.

## 2018-06-17 NOTE — Assessment & Plan Note (Signed)
Very low troponin elevation in the hospital.  I think this could have simply been because of hypoxic respiratory failure, however the presence of left main calcification would argue the potential for mild demand ischemia.  Another reason for being concerned enough to check Coronary Calcium Score and Coronary CTA

## 2018-08-07 ENCOUNTER — Telehealth: Payer: Self-pay

## 2018-08-07 NOTE — Telephone Encounter (Signed)
Copied from [place] [phone]. Topic: Appointment Scheduling - Scheduling Inquiry for Clinic >> Aug 07, 2018  3:02 PM Vernona Rieger wrote: Reason for CRM: patient would like to move his appt up due to work. He works for [employer] and they are wanting the employees to rotate shifts between first and second. He said since he has COPD - he is asking for a note to keep him on first shift so that he can do his breathing treatments at night time. If he can get the work note before the appt that would be good too. Patient is interested in a virtual or tele visit. The email on file is the most accurate ([email or web address]) and his call back number is [phone].

## 2018-08-07 NOTE — Telephone Encounter (Signed)
Please advise- could schedule tomorrow.

## 2018-08-08 ENCOUNTER — Encounter: Payer: Self-pay | Admitting: Internal Medicine

## 2018-08-08 ENCOUNTER — Telehealth: Payer: Self-pay

## 2018-08-08 ENCOUNTER — Other Ambulatory Visit: Payer: Self-pay

## 2018-08-08 ENCOUNTER — Ambulatory Visit (INDEPENDENT_AMBULATORY_CARE_PROVIDER_SITE_OTHER): Payer: 59 | Admitting: Internal Medicine

## 2018-08-08 DIAGNOSIS — E785 Hyperlipidemia, unspecified: Secondary | ICD-10-CM | POA: Diagnosis not present

## 2018-08-08 DIAGNOSIS — J449 Chronic obstructive pulmonary disease, unspecified: Secondary | ICD-10-CM

## 2018-08-08 MED ORDER — ROSUVASTATIN CALCIUM 20 MG PO TABS: 20.0000 mg | ORAL_TABLET | Freq: Every day | ORAL | 3 refills | Status: DC

## 2018-08-08 NOTE — Progress Notes (Signed)
Subjective:    Patient ID: Richard Gates, male    DOB: 11-29-1960, 58 y.o.   MRN: [id number]  DOS:  08/08/2018 Type of visit - description: Virtual Visit via Video Note  I connected with@ on 08/08/18 at  9:00 AM EDT by a video enabled telemedicine application and verified that I am speaking with the correct person using two identifiers.   THIS ENCOUNTER IS A VIRTUAL VISIT DUE TO COVID-19 - PATIENT WAS NOT SEEN IN THE OFFICE. PATIENT HAS CONSENTED TO VIRTUAL VISIT / TELEMEDICINE VISIT   Location of patient: home  Location of provider: office  I discussed the limitations of evaluation and management by telemedicine and the availability of in person appointments. The patient expressed understanding and agreed to proceed.  History of Present Illness: Follow-up Since the last office visit he is doing well. COPD: Good compliance with meds, needs letter for work. Dyslipidemia: Was prescribed Crestor by cardiology, took it for several weeks ran out.  Did not refill the medication and wonders if he has to. CAD: Saw cardiology, note reviewed.  Has not been taking aspirin   Review of Systems No fever chills No nausea, vomiting, diarrhea No chest pain Very seldom has shortness of breath, uses his rescue inhalers very good.  Past Medical History:  Diagnosis Date  . COPD (chronic obstructive pulmonary disease) ([place])   . GERD (gastroesophageal reflux disease)     Past Surgical History:  Procedure Laterality Date  . APPENDECTOMY    . TRANSTHORACIC ECHOCARDIOGRAM  04/2018   EF 65-70% (vigorous). No RWMA.  Gr 1 DD.  Normal valves.  (In setting of COPD exacerbation)    Social History   Socioeconomic History  . Marital status: Married    Spouse name: Not on file  . Number of children: 1  . Years of education: Not on file  . Highest education level: Not on file  Occupational History  . Occupation: Print production planner, order processing   Social Needs  . Financial resource strain: Not on  file  . Food insecurity:    Worry: Not on file    Inability: Not on file  . Transportation needs:    Medical: Not on file    Non-medical: Not on file  Tobacco Use  . Smoking status: Former Smoker    Last attempt to quit: 07/31/2016    Years since quitting: 2.0  . Smokeless tobacco: Never Used  . Tobacco comment: off tobacco as off 04/2018; h/o 1 pack a day from age 53 to 22,  Substance and Sexual Activity  . Alcohol use: Yes    Comment: Social  . Drug use: No  . Sexual activity: Not on file  Lifestyle  . Physical activity:    Days per week: Not on file    Minutes per session: Not on file  . Stress: Not on file  Relationships  . Social connections:    Talks on phone: Not on file    Gets together: Not on file    Attends religious service: Not on file    Active member of club or organization: Not on file    Attends meetings of clubs or organizations: Not on file    Relationship status: Not on file  . Intimate partner violence:    Fear of current or ex partner: Not on file    Emotionally abused: Not on file    Physically abused: Not on file    Forced sexual activity: Not on file  Other Topics  Concern  . Not on file  Social History Narrative   Household- pt and wife   1 daughter    4 g-children   1 g-g child      Allergies as of 08/08/2018   No Known Allergies     Medication List       Accurate as of August 08, 2018  8:59 AM. Always use your most recent med list.        Albuterol Sulfate 108 (90 Base) MCG/ACT Aepb Commonly known as:  ProAir RespiClick Inhale 2 puffs into the lungs every 6 (six) hours as needed.   budesonide-formoterol 80-4.5 MCG/ACT inhaler Commonly known as:  SYMBICORT Inhale 2 puffs into the lungs 2 (two) times daily.   metoprolol tartrate 50 MG tablet Commonly known as:  LOPRESSOR Take 2 tablets (100 mg total) by mouth once for 1 dose. TAKE TWO HOUR PRIOR TO  SCHEDULE CARDAIC TEST   rosuvastatin 20 MG tablet Commonly known as:  CRESTOR  Take 1 tablet (20 mg total) by mouth daily.           Objective:   Physical Exam There were no vitals taken for this visit. This is a video conference, alert oriented x3, no distress    Assessment     Assessment GERD Allergies , nasal  COPD, PFTs 2013showed ratio of 58, FEV1 of 1.78- 52% without significant bronchodilator response, TLC was 77% with DLCO 78% Smoker  PLAN: COPD: Currently well controlled on Symbicort, takes a nebulization every night, occasionally uses albuterol inhaler.  Needs a letter for work stating that he needs to work first shift so he can continue using his nebulization before bedtime.  Letter will be provided. Left main coronary artery disease: Saw cardiology, he recommended aspirin and Crestor.  Rx a CT coronary angiogram.   He took Crestor temporarily, is not taking aspirin.  Angiogram pending. Plan: Restart aspirin, restart Crestor, labs in 6 weeks. Keep appointment with cardiology COVID-19 advise: The patient has COPD, he is following all the CDC guidelines ref prevention, he is still working, he feels safe at work and has been able to keep distance from other people and uses his mask regularly. RTC 6 weeks for labs, 3 months for office visit, patient will call and schedule an appointment     I discussed the assessment and treatment plan with the patient. The patient was provided an opportunity to ask questions and all were answered. The patient agreed with the plan and demonstrated an understanding of the instructions.   The patient was advised to call back or seek an in-person evaluation if the symptoms worsen or if the condition fails to improve as anticipated.

## 2018-08-08 NOTE — Telephone Encounter (Signed)
Virtual visit scheduled.  

## 2018-08-08 NOTE — Telephone Encounter (Signed)
Virtual Visit Pre-Appointment Phone Call  Steps For Call:  1. Confirm consent - "In the setting of the current Covid19 crisis, you are scheduled for a (phone or video) visit with your provider on (date) at (time).  Just as we do with many in-office visits, in order for you to participate in this visit, we must obtain consent.  If you'd like, I can send this to your mychart (if signed up) or email for you to review.  Otherwise, I can obtain your verbal consent now.  All virtual visits are billed to your insurance company just like a normal visit would be.  By agreeing to a virtual visit, we'd like you to understand that the technology does not allow for your provider to perform an examination, and thus may limit your provider's ability to fully assess your condition.  Finally, though the technology is pretty good, we cannot assure that it will always work on either your or our end, and in the setting of a video visit, we may have to convert it to a phone-only visit.  In either situation, we cannot ensure that we have a secure connection.  Are you willing to proceed?"  2. Give patient instructions for WebEx download to smartphone as below if video visit  3. Advise patient to be prepared with any vital sign or heart rhythm information, their current medicines, and a piece of paper and pen handy for any instructions they may receive the day of their visit  4. Inform patient they will receive a phone call 15 minutes prior to their appointment time (may be from unknown caller ID) so they should be prepared to answer  5. Confirm that appointment type is correct in Epic appointment notes (video vs telephone)    TELEPHONE CALL NOTE  Richard Gates has been deemed a candidate for a follow-up tele-health visit to limit community exposure during the Covid-19 pandemic. I spoke with the patient via phone to ensure availability of phone/video source, confirm preferred email & phone number, and discuss  instructions and expectations.  I reminded Richard Gates to be prepared with any vital sign and/or heart rhythm information that could potentially be obtained via home monitoring, at the time of his visit. I reminded Richard Gates to expect a phone call at the time of his visit if his visit.  Did the patient verbally acknowledge consent to treatment? YES  Jacqulynn Cadet, [place] 08/08/2018 4:43 PM   DOWNLOADING THE [place], go to [employer] and type in WebEx in the search bar. [place] [employer], the blue/green circle. The app is free but as with any other app downloads, their phone may require them to verify saved payment information or Apple password. The patient does NOT have to create an account.  - If Android, ask patient to go to [employer] and type in WebEx in the search bar. [place] [employer], the blue/green circle. The app is free but as with any other app downloads, their phone may require them to verify saved payment information or Android password. The patient does NOT have to create an account.   CONSENT FOR TELE-HEALTH VISIT - PLEASE REVIEW  I hereby voluntarily request, consent and authorize [facility] and its employed or contracted physicians, physician assistants, nurse practitioners or other licensed health care professionals (the Practitioner), to provide me with telemedicine health care services (the "Services") as deemed necessary by the treating Practitioner. I  acknowledge and consent to receive the Services by the Practitioner via telemedicine. I understand that the telemedicine visit will involve communicating with the Practitioner through live audiovisual communication technology and the disclosure of certain medical information by electronic transmission. I acknowledge that I have been given the opportunity to request an in-person assessment or other available alternative prior to the telemedicine visit and  am voluntarily participating in the telemedicine visit.  I understand that I have the right to withhold or withdraw my consent to the use of telemedicine in the course of my care at any time, without affecting my right to future care or treatment, and that the Practitioner or I may terminate the telemedicine visit at any time. I understand that I have the right to inspect all information obtained and/or recorded in the course of the telemedicine visit and may receive copies of available information for a reasonable fee.  I understand that some of the potential risks of receiving the Services via telemedicine include:  Marland Kitchen Delay or interruption in medical evaluation due to technological equipment failure or disruption; . Information transmitted may not be sufficient (e.g. poor resolution of images) to allow for appropriate medical decision making by the Practitioner; and/or  . In rare instances, security protocols could fail, causing a breach of personal health information.  Furthermore, I acknowledge that it is my responsibility to provide information about my medical history, conditions and care that is complete and accurate to the best of my ability. I acknowledge that Practitioner's advice, recommendations, and/or decision may be based on factors not within their control, such as incomplete or inaccurate data provided by me or distortions of diagnostic images or specimens that may result from electronic transmissions. I understand that the practice of medicine is not an exact science and that Practitioner makes no warranties or guarantees regarding treatment outcomes. I acknowledge that I will receive a copy of this consent concurrently upon execution via email to the email address I last provided but may also request a printed copy by calling the office of [place].    I understand that my insurance will be billed for this visit.   I have read or had this consent read to me. . I understand the  contents of this consent, which adequately explains the benefits and risks of the Services being provided via telemedicine.  . I have been provided ample opportunity to ask questions regarding this consent and the Services and have had my questions answered to my satisfaction. . I give my informed consent for the services to be provided through the use of telemedicine in my medical care  By participating in this telemedicine visit I agree to the above.

## 2018-08-08 NOTE — Assessment & Plan Note (Signed)
COPD: Currently well controlled on Symbicort, takes a nebulization every night, occasionally uses albuterol inhaler.  Needs a letter for work stating that he needs to work first shift so he can continue using his nebulization before bedtime.  Letter will be provided. Left main coronary artery disease: Saw cardiology, he recommended aspirin and Crestor.  Rx a CT coronary angiogram.   He took Crestor temporarily, is not taking aspirin.  Angiogram pending. Plan: Restart aspirin, restart Crestor, labs in 6 weeks. Keep appointment with cardiology COVID-19 advise: The patient has COPD, he is following all the CDC guidelines ref prevention, he is still working, he feels safe at work and has been able to keep distance from other people and uses his mask regularly. RTC 6 weeks for labs, 3 months for office visit, patient will call and schedule an appointment

## 2018-08-08 NOTE — Telephone Encounter (Signed)
[place] visit for today

## 2018-08-15 ENCOUNTER — Ambulatory Visit: Payer: 59 | Admitting: Internal Medicine

## 2018-08-16 ENCOUNTER — Telehealth: Payer: Self-pay | Admitting: Cardiology

## 2018-08-16 NOTE — Telephone Encounter (Signed)
Smartphone/mychart/consent obtained/pre reg complete/dc/04.14.2020

## 2018-08-17 ENCOUNTER — Encounter: Payer: Self-pay | Admitting: Cardiology

## 2018-08-17 ENCOUNTER — Telehealth (INDEPENDENT_AMBULATORY_CARE_PROVIDER_SITE_OTHER): Payer: 59 | Admitting: Cardiology

## 2018-08-17 VITALS — Ht 68.0 in | Wt 165.0 lb

## 2018-08-17 DIAGNOSIS — R0609 Other forms of dyspnea: Secondary | ICD-10-CM

## 2018-08-17 DIAGNOSIS — E785 Hyperlipidemia, unspecified: Secondary | ICD-10-CM | POA: Diagnosis not present

## 2018-08-17 DIAGNOSIS — I2510 Atherosclerotic heart disease of native coronary artery without angina pectoris: Secondary | ICD-10-CM

## 2018-08-17 NOTE — Progress Notes (Signed)
Virtual Visit via Video Note   This visit type was conducted due to national recommendations for restrictions regarding the COVID-19 Pandemic (e.g. social distancing) in an effort to limit this patient's exposure and mitigate transmission in our community.  Due to his co-morbid illnesses, this patient is at least at moderate risk for complications without adequate follow up.  This format is felt to be most appropriate for this patient at this time.  All issues noted in this document were discussed and addressed.  A limited physical exam was performed with this format.  Please refer to the patient's chart for his consent to telehealth for [facility].   Patient has given verbal permission to conduct this visit via virtual appointment and to bill insurance 08/14/2018 10:11 AM     Evaluation Performed:  Follow-up visit  Date:  08/17/2018   ID:  Richard Gates, DOB 1961/01/21, MRN [id number]  Patient Location: Home Provider Location: Other:  Car - parked.  PCP:  Colon Branch, MD  Cardiologist:  Glenetta Hew, MD  Electrophysiologist:  None   Chief Complaint: 105-month follow-up after coronary angiogram  History of Present Illness:    Richard Gates is a 58 y.o. male with PMH notable for COPD and coronary calcification noted on CT scan who presents via audio/video conferencing for a telehealth visit today for 62-month follow-up.  Richard Gates was last seen on June 15, 2018 in consultation at the request of Dr.Paz after a screening CT scan showed left main coronary artery calcification.   -She has had a hospitalization for COPD exacerbation in December 2019 and had a trivial troponin elevation.  She was tachypneic and hypoxic.  Denied any further chest pain or pressure.  No dyspnea besides her baseline.  --Coronary CT angiogram ordered -delayed because of COVID-19.  Started on statin and aspirin.  Interval History:  I am seeing Richard Gates today in the simply to explain to him the  reasons behind why his CT scan was not done.  I also wanted to confirm that he was not having any active anginal symptoms.  He seems to be doing quite well.  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  Easily distanced more than 6 feet with aged coworker.  He works for [employer] and therefore has a vital job that he cannot afford to be out of work has had what he needs power..  He has been relatively stable ensuring that he is maintaining social distancing.  No cardiac symptoms  Cardiovascular ROS: positive for - His chronic exertional dyspnea from COPD negative for - chest pain, edema, irregular heartbeat, orthopnea, palpitations, paroxysmal nocturnal dyspnea, rapid heart rate or Near syncope.  TIA/amorous fugax.  The patient does not have symptoms concerning for COVID-19 infection (fever, chills, cough, or new shortness of breath).  The patient is practicing social distancing.  Universal masking @   ROS:  Please see the history of present illness.    Review of Systems  Constitutional: Negative for chills, fever, malaise/fatigue and weight loss.  HENT: Negative for congestion and nosebleeds.   Respiratory: Positive for cough and shortness of breath.        COPD.  But seems to be doing quite well  Gastrointestinal: Negative for constipation, nausea and vomiting.  Neurological: Negative for dizziness.    Past Medical History:  Diagnosis Date  . COPD (chronic obstructive pulmonary disease) ([place])   .  GERD (gastroesophageal reflux disease)    Past Surgical History:  Procedure Laterality Date  . APPENDECTOMY    . TRANSTHORACIC ECHOCARDIOGRAM  04/2018   EF 65-70% (vigorous). No RWMA.  Gr 1 DD.  Normal valves.  (In setting of COPD exacerbation)      Chest CT (lung cancer screening): Lung-RADS 1.  Left main coronary atherosclerosis.  Atherosclerotic nonaneurysmal thoracic aorta  Current Meds  Medication Sig  . Albuterol Sulfate  (PROAIR RESPICLICK) 578 (90 Base) MCG/ACT AEPB Inhale 2 puffs into the lungs every 6 (six) hours as needed.  . budesonide-formoterol (SYMBICORT) 80-4.5 MCG/ACT inhaler Inhale 2 puffs into the lungs 2 (two) times daily.  . rosuvastatin (CRESTOR) 20 MG tablet Take 1 tablet (20 mg total) by mouth daily.     Allergies:   Patient has no known allergies.   Social History   Tobacco Use  . Smoking status: Former Smoker    Last attempt to quit: 07/31/2016    Years since quitting: 2.0  . Smokeless tobacco: Never Used  . Tobacco comment: off tobacco as off 04/2018; h/o 1 pack a day from age 35 to 62,  Substance Use Topics  . Alcohol use: Yes    Comment: Social  . Drug use: No     Family Hx: The patient's family history includes Breast cancer in his sister; Heart attack in his maternal uncle; Heart disease in his paternal aunt and paternal uncle; Hypertension in an other family member; Stroke in his father. There is no history of Colon cancer or Prostate cancer.   Prior CV studies:   The following studies were reviewed today: . CT not yet done  Labs/Other Tests and Data Reviewed:    EKG:  No ECG reviewed.  Recent Labs: 04/03/2018: TSH 1.44 04/16/2018: B Natriuretic Peptide 41.0 05/11/2018: ALT 18; BUN 19; Creat 0.85; Hemoglobin 12.5; Platelets 365; Potassium 4.2; Sodium 143   Recent Lipid Panel Lab Results  Component Value Date/Time   CHOL 209 (H) 04/03/2018 03:45 PM   TRIG 217.0 (H) 04/03/2018 03:45 PM   HDL 72.00 04/03/2018 03:45 PM   CHOLHDL 3 04/03/2018 03:45 PM   LDLCALC 91 05/17/2011 08:23 AM   LDLDIRECT 108.0 04/03/2018 03:45 PM    Wt Readings from Last 3 Encounters:  08/17/18 165 lb (74.8 kg)  06/15/18 167 lb 9.6 oz (76 kg)  05/11/18 166 lb 2 oz (75.4 kg)     Objective:    Vital Signs:  Ht 5\' 8"  (1.727 m)   Wt 165 lb (74.8 kg)   BMI 25.09 kg/m   GEN:  no acute distress RESPIRATORY:  normal respiratory effort, symmetric expansion NEURO:  alert and oriented x 3,  no obvious focal deficit PSYCH:  normal affect no vitals done   ASSESSMENT & PLAN:    Problem List Items Addressed This Visit    Coronary artery calcification seen on computed tomography - Primary   DOE (dyspnea on exertion)   Hyperlipidemia with target LDL less than 70   Left main coronary artery disease     Richard Gates continues to do well from a cardiac standpoint.   I suspect his exertional dyspnea is related to COPD, and the elevated troponin in the hospital was related to significant hypoxia.  He does have a coronary calcification noted on the left main on CT scan and for that reason we had ordered a coronary CT angiogram.  Unfortunately that was delayed due to COVID-19 restrictions.  We will ensure that this is rescheduled once the  restrictions are lifted.  I would like for him to continue taking the statin which was refilled by his PCP.  For now with potential left main disease with target LDL less than 70.  When his results of the CT scan are returned, my plan is to contact him via telemedicine visit to discuss the results and any potential follow-up procedures that need to be done.  If the test does not look like any significant CAD, would simply recommend continued statin and possibly bisoprolol as he was quite tachycardic when I last saw him.  Can determine best course of action after results are completed.   COVID-19 Education: The signs and symptoms of COVID-19 were discussed with the patient and how to seek care for testing (follow up with PCP or arrange E-visit).   The importance of social distancing was discussed today.  Time:   Today, I have spent 9 minutes with the patient with telehealth technology discussing the above problems.     Medication Adjustments/Labs and Tests Ordered: Current medicines are reviewed at length with the patient today.  Concerns regarding medicines are outlined above.  Medication Instructions:  --Continue taking cholesterol medicine  (rosuvastatin) -order was refilled by PCP. --Continue aspirin 81 mg  Tests Ordered: No orders of the defined types were placed in this encounter. --Will need to have Coronary CT Angiogram rescheduled   Medication Changes: No orders of the defined types were placed in this encounter.  None  Disposition:  Follow up prn (after CTA Angiogram) as determined from results of coronary CT angiogram.  Will contact using telemedicine (Doximity call) to determine need for in person visit.    Signed, Glenetta Hew, MD  08/17/2018 4:55 PM    [facility]

## 2018-08-17 NOTE — Patient Instructions (Addendum)
Medication Instructions:   --Continue taking cholesterol medicine (rosuvastatin)  --Continue taking aspirin 81 mg  If you need a refill on your cardiac medications before your next appointment, please call your pharmacy.   Lab work: None  Testing/Procedures: --Will need to have Coronary CT Angiogram rescheduled   Follow-Up: Follow up (after CTA Angiogram) as determined from results of coronary CT angiogram.  Will contact using telemedicine (Doximity call) to determine need for in person visit.

## 2018-08-30 ENCOUNTER — Other Ambulatory Visit: Payer: Self-pay

## 2018-08-30 ENCOUNTER — Other Ambulatory Visit (INDEPENDENT_AMBULATORY_CARE_PROVIDER_SITE_OTHER): Payer: 59

## 2018-08-30 DIAGNOSIS — E785 Hyperlipidemia, unspecified: Secondary | ICD-10-CM

## 2018-08-31 LAB — LIPID PANEL
Cholesterol: 158 mg/dL (ref 0–200)
HDL: 73.8 mg/dL (ref >39.00)
LDL Cholesterol: 54 mg/dL (ref 0–99)
NonHDL: 84.57
Total CHOL/HDL Ratio: 2
Triglycerides: 153 mg/dL — ABNORMAL HIGH (ref 0.0–149.0)
VLDL: 30.6 mg/dL (ref 0.0–40.0)

## 2018-08-31 LAB — AST: AST: 27 U/L (ref 0–37)

## 2018-08-31 LAB — ALT: ALT: 35 U/L (ref 0–53)

## 2018-08-31 MED ORDER — ROSUVASTATIN CALCIUM 20 MG PO TABS: 20.0000 mg | ORAL_TABLET | Freq: Every day | ORAL | 3 refills | Status: DC

## 2018-08-31 NOTE — Addendum Note (Signed)
Addended byDamita Dunnings D on: 08/31/2018 10:41 AM   Modules accepted: Orders

## 2018-09-05 ENCOUNTER — Other Ambulatory Visit: Payer: Self-pay

## 2018-09-05 ENCOUNTER — Telehealth: Payer: Self-pay

## 2018-09-05 ENCOUNTER — Ambulatory Visit (INDEPENDENT_AMBULATORY_CARE_PROVIDER_SITE_OTHER): Payer: 59 | Admitting: Internal Medicine

## 2018-09-05 ENCOUNTER — Ambulatory Visit: Payer: 59 | Admitting: Internal Medicine

## 2018-09-05 DIAGNOSIS — E785 Hyperlipidemia, unspecified: Secondary | ICD-10-CM

## 2018-09-05 DIAGNOSIS — J449 Chronic obstructive pulmonary disease, unspecified: Secondary | ICD-10-CM | POA: Diagnosis not present

## 2018-09-05 DIAGNOSIS — N529 Male erectile dysfunction, unspecified: Secondary | ICD-10-CM | POA: Diagnosis not present

## 2018-09-05 MED ORDER — BUDESONIDE-FORMOTEROL FUMARATE 80-4.5 MCG/ACT IN AERO: 2.0000 | INHALATION_SPRAY | Freq: Two times a day (BID) | RESPIRATORY_TRACT | 3 refills | Status: DC

## 2018-09-05 MED ORDER — SILDENAFIL CITRATE 20 MG PO TABS: 60.0000 mg | ORAL_TABLET | Freq: Every evening | ORAL | 3 refills | Status: DC | PRN

## 2018-09-05 NOTE — Telephone Encounter (Signed)
yes

## 2018-09-05 NOTE — Telephone Encounter (Signed)
Pt's insurance plan doesn't cover generic Symbicort or the 80-4.31mcg/act dosage- okay to change to 160 dose?

## 2018-09-05 NOTE — Telephone Encounter (Signed)
PA denied.   [id number];FXTKWI:[id number];Review Type:Prior Auth;Appeal Information: Attention:ATTN: [place] [id number]. GDJME,QA,[id number] LNLGX:[phone] XKG:[phone]; Important - Please read the below note on eAppeals: Please reference the denial letter for information on the rights for an appeal, rationale for the denial, and how to submit an appeal including if any information is needed to support the appeal. Note about urgent situations - Generally, an urgent situation is one which, in the opinion of the provider, the health of the patient may be in serious jeopardy or may experience pain that cannot be adequately controlled while waiting for a decision on the appeal

## 2018-09-05 NOTE — Progress Notes (Signed)
Subjective:    Patient ID: Richard Gates, male    DOB: 07-30-1960, 58 y.o.   MRN: [id number]  DOS:  09/05/2018 Type of visit - description: Virtual Visit via Video Note  I connected with@ on 09/05/18 at 10:20 AM EDT by a video enabled telemedicine application and verified that I am speaking with the correct person using two identifiers.   THIS ENCOUNTER IS A VIRTUAL VISIT DUE TO COVID-19 - PATIENT WAS NOT SEEN IN THE OFFICE. PATIENT HAS CONSENTED TO VIRTUAL VISIT / TELEMEDICINE VISIT   Location of patient: home  Location of provider: office  I discussed the limitations of evaluation and management by telemedicine and the availability of in person appointments. The patient expressed understanding and agreed to proceed.  History of Present Illness: Follow-up Hyperlipidemia: Has some question about results, likes to discuss them with me COPD: Doing great, needs a refill on Symbicort although he is now using it as needed ED, new problem: For the last 3 months, has noted decrease in the quality of his erections.  Medication?    Review of Systems Denies fever chills No chest pain no difficulty breathing No problems with libido  Past Medical History:  Diagnosis Date  . COPD (chronic obstructive pulmonary disease) ([place])   . GERD (gastroesophageal reflux disease)     Past Surgical History:  Procedure Laterality Date  . APPENDECTOMY    . TRANSTHORACIC ECHOCARDIOGRAM  04/2018   EF 65-70% (vigorous). No RWMA.  Gr 1 DD.  Normal valves.  (In setting of COPD exacerbation)    Social History   Socioeconomic History  . Marital status: Married    Spouse name: Not on file  . Number of children: 1  . Years of education: Not on file  . Highest education level: Not on file  Occupational History  . Occupation: Print production planner, order processing   Social Needs  . Financial resource strain: Not on file  . Food insecurity:    Worry: Not on file    Inability: Not on file  . Transportation  needs:    Medical: Not on file    Non-medical: Not on file  Tobacco Use  . Smoking status: Former Smoker    Last attempt to quit: 07/31/2016    Years since quitting: 2.0  . Smokeless tobacco: Never Used  . Tobacco comment: off tobacco as off 04/2018; h/o 1 pack a day from age 59 to 22,  Substance and Sexual Activity  . Alcohol use: Yes    Comment: Social  . Drug use: No  . Sexual activity: Not on file  Lifestyle  . Physical activity:    Days per week: Not on file    Minutes per session: Not on file  . Stress: Not on file  Relationships  . Social connections:    Talks on phone: Not on file    Gets together: Not on file    Attends religious service: Not on file    Active member of club or organization: Not on file    Attends meetings of clubs or organizations: Not on file    Relationship status: Not on file  . Intimate partner violence:    Fear of current or ex partner: Not on file    Emotionally abused: Not on file    Physically abused: Not on file    Forced sexual activity: Not on file  Other Topics Concern  . Not on file  Social History Narrative   Household- pt and wife  1 daughter    4 g-children   1 g-g child      Allergies as of 09/05/2018   No Known Allergies     Medication List       Accurate as of Sep 05, 2018 10:16 AM. Always use your most recent med list.        Albuterol Sulfate 108 (90 Base) MCG/ACT Aepb Commonly known as:  ProAir RespiClick Inhale 2 puffs into the lungs every 6 (six) hours as needed.   budesonide-formoterol 80-4.5 MCG/ACT inhaler Commonly known as:  SYMBICORT Inhale 2 puffs into the lungs 2 (two) times daily.   metoprolol tartrate 50 MG tablet Commonly known as:  LOPRESSOR Take 2 tablets (100 mg total) by mouth once for 1 dose. TAKE TWO HOUR PRIOR TO  SCHEDULE CARDAIC TEST   rosuvastatin 20 MG tablet Commonly known as:  CRESTOR Take 1 tablet (20 mg total) by mouth daily.           Objective:   Physical Exam There  were no vitals taken for this visit. This was a virtual video visit, shortly after the visit is started, the video quality decreased significantly, we continue the appointment via phone.    Assessment     Assessment GERD Hyperlipidemia Allergies , nasal  COPD, PFTs 2013showed ratio of 58, FEV1 of 1.78- 52% without significant bronchodilator response, TLC was 77% with DLCO 78% Smoker  PLAN: COPD: He is doing so well, he is taking Symbicort/albuterol as needed.  That is okay although I recommend him to take Symbicort twice a day the times of the year he expect respiratory problems for example during the winter or during pollen season.  Refill sent. Hyperlipidemia: Since the last visit, FLP showed with a excellent control with Crestor, continue with it. Left main coronary artery disease: Remains asymptomatic, coronary angiogram pending. ED: New problem, he asked about Viagra, if his heart work-up is good, he is a good candidate. I will send the prescribed for sildenafil, how to use it, what to expect, side effects including persistent erection were discussed.  He is very aware that cannot start sildenafil and this his heart work-up is normal/negative. Since he is doing well, next visit will be 04/2019 CPX     I discussed the assessment and treatment plan with the patient. The patient was provided an opportunity to ask questions and all were answered. The patient agreed with the plan and demonstrated an understanding of the instructions.   The patient was advised to call back or seek an in-person evaluation if the symptoms worsen or if the condition fails to improve as anticipated.

## 2018-09-05 NOTE — Telephone Encounter (Signed)
PA initiated via Covermymeds; KEY: [id number]. Awaiting determination.

## 2018-09-07 DIAGNOSIS — N529 Male erectile dysfunction, unspecified: Secondary | ICD-10-CM | POA: Insufficient documentation

## 2018-09-07 MED ORDER — SYMBICORT 160-4.5 MCG/ACT IN AERO: 2.0000 | INHALATION_SPRAY | Freq: Two times a day (BID) | RESPIRATORY_TRACT | 5 refills | Status: DC

## 2018-09-07 NOTE — Assessment & Plan Note (Signed)
COPD: He is doing so well, he is taking Symbicort/albuterol as needed.  That is okay although I recommend him to take Symbicort twice a day the times of the year he expect respiratory problems for example during the winter or during pollen season.  Refill sent. Hyperlipidemia: Since the last visit, FLP showed with a excellent control with Crestor, continue with it. Left main coronary artery disease: Remains asymptomatic, coronary angiogram pending. ED: New problem, he asked about Viagra, if his heart work-up is good, he is a good candidate. I will send the prescribed for sildenafil, how to use it, what to expect, side effects including persistent erection were discussed.  He is very aware that cannot start sildenafil and this his heart work-up is normal/negative. Since he is doing well, next visit will be 04/2019 CPX

## 2018-09-07 NOTE — Telephone Encounter (Signed)
Symbicort 160 sent to pharmacy.

## 2018-09-17 ENCOUNTER — Other Ambulatory Visit: Payer: Self-pay | Admitting: Internal Medicine

## 2018-10-15 ENCOUNTER — Telehealth: Payer: Self-pay | Admitting: Cardiology

## 2018-10-15 NOTE — Telephone Encounter (Signed)
New Message    Celeste from [place] is calling and  She wants to confirm they want to do an appeal     Please call back

## 2018-10-15 NOTE — Telephone Encounter (Signed)
She question if this appeal is needed for CCTA  CELESTE IS AWARE WILL SEND TO [place]

## 2018-12-10 NOTE — Telephone Encounter (Signed)
Appeal submitted for denied C-CTA authorization.

## 2019-01-24 ENCOUNTER — Ambulatory Visit: Payer: 59

## 2019-02-01 ENCOUNTER — Ambulatory Visit (INDEPENDENT_AMBULATORY_CARE_PROVIDER_SITE_OTHER): Payer: 59

## 2019-02-01 ENCOUNTER — Other Ambulatory Visit: Payer: Self-pay

## 2019-02-01 DIAGNOSIS — Z23 Encounter for immunization: Secondary | ICD-10-CM

## 2019-02-23 ENCOUNTER — Other Ambulatory Visit: Payer: Self-pay | Admitting: Internal Medicine

## 2019-04-11 ENCOUNTER — Other Ambulatory Visit: Payer: Self-pay

## 2019-04-12 ENCOUNTER — Ambulatory Visit (INDEPENDENT_AMBULATORY_CARE_PROVIDER_SITE_OTHER): Payer: 59 | Admitting: Internal Medicine

## 2019-04-12 ENCOUNTER — Encounter: Payer: Self-pay | Admitting: Internal Medicine

## 2019-04-12 ENCOUNTER — Other Ambulatory Visit: Payer: Self-pay

## 2019-04-12 VITALS — BP 168/88 | HR 90 | Temp 96.8°F | Resp 16 | Ht 68.0 in | Wt 170.1 lb

## 2019-04-12 DIAGNOSIS — J449 Chronic obstructive pulmonary disease, unspecified: Secondary | ICD-10-CM | POA: Diagnosis not present

## 2019-04-12 DIAGNOSIS — E785 Hyperlipidemia, unspecified: Secondary | ICD-10-CM

## 2019-04-12 DIAGNOSIS — Z0000 Encounter for general adult medical examination without abnormal findings: Secondary | ICD-10-CM | POA: Diagnosis not present

## 2019-04-12 DIAGNOSIS — Z87891 Personal history of nicotine dependence: Secondary | ICD-10-CM

## 2019-04-12 DIAGNOSIS — I2510 Atherosclerotic heart disease of native coronary artery without angina pectoris: Secondary | ICD-10-CM

## 2019-04-12 DIAGNOSIS — Z720 Tobacco use: Secondary | ICD-10-CM

## 2019-04-12 DIAGNOSIS — Z23 Encounter for immunization: Secondary | ICD-10-CM

## 2019-04-12 DIAGNOSIS — Z122 Encounter for screening for malignant neoplasm of respiratory organs: Secondary | ICD-10-CM

## 2019-04-12 MED ORDER — AMLODIPINE BESYLATE 5 MG PO TABS: 5.0000 mg | ORAL_TABLET | Freq: Every day | ORAL | 6 refills | Status: DC

## 2019-04-12 MED ORDER — FLUTICASONE-SALMETEROL 100-50 MCG/DOSE IN AEPB: 1.0000 | INHALATION_SPRAY | Freq: Two times a day (BID) | RESPIRATORY_TRACT | 6 refills | Status: DC

## 2019-04-12 MED ORDER — PROAIR RESPICLICK 108 (90 BASE) MCG/ACT IN AEPB: 2.0000 | INHALATION_SPRAY | Freq: Four times a day (QID) | RESPIRATORY_TRACT | 6 refills | Status: DC | PRN

## 2019-04-12 MED ORDER — PANTOPRAZOLE SODIUM 40 MG PO TBEC: 40.0000 mg | DELAYED_RELEASE_TABLET | Freq: Every day | ORAL | 6 refills | Status: DC

## 2019-04-12 NOTE — Progress Notes (Signed)
Subjective:    Patient ID: Richard Gates, male    DOB: February 20, 1961, 58 y.o.   MRN: [id number]  DOS:  04/12/2019 Type of visit - description: CPX COPD: Unable to afford Symbicort, does not have albuterol.  See review of systems  BP Readings from Last 3 Encounters:  04/12/19 (!) 168/88  06/15/18 130/75  05/11/18 (!) 158/84    Review of Systems Denies fever chills He gets short of breath/DOE sometimes. Denies chest pain, palpitations or lower extremity edema History of COPD, has episodes of cough daily with yellowish sputum.  No hemoptysis. Has some heartburn, described as food coming up to his throat, no substernal chest pain.  No dysphagia or odynophagia   Other than above, a 14 point review of systems is negative    Past Medical History:  Diagnosis Date  . COPD (chronic obstructive pulmonary disease) ([place])   . GERD (gastroesophageal reflux disease)     Past Surgical History:  Procedure Laterality Date  . APPENDECTOMY    . TRANSTHORACIC ECHOCARDIOGRAM  04/2018   EF 65-70% (vigorous). No RWMA.  Gr 1 DD.  Normal valves.  (In setting of COPD exacerbation)    Social History   Socioeconomic History  . Marital status: Married    Spouse name: Not on file  . Number of children: 1  . Years of education: Not on file  . Highest education level: Not on file  Occupational History  . Occupation: [employer], order processing   Tobacco Use  . Smoking status: Former Smoker    Quit date: 07/31/2016    Years since quitting: 2.7  . Smokeless tobacco: Never Used  . Tobacco comment: off tobacco as off 04/2018; h/o 1 pack a day from age 28 to 38,  Substance and Sexual Activity  . Alcohol use: Yes    Comment: Social  . Drug use: No  . Sexual activity: Not on file  Other Topics Concern  . Not on file  Social History Narrative   Household- pt and wife   1 daughter    4 g-children   2 g-g child   Social Determinants of Health   Financial Resource Strain:   . Difficulty of  Paying Living Expenses: Not on file  Food Insecurity:   . Worried About Charity fundraiser in the Last Year: Not on file  . Ran Out of Food in the Last Year: Not on file  Transportation Needs:   . Lack of Transportation (Medical): Not on file  . Lack of Transportation (Non-Medical): Not on file  Physical Activity:   . Days of Exercise per Week: Not on file  . Minutes of Exercise per Session: Not on file  Stress:   . Feeling of Stress : Not on file  Social Connections:   . Frequency of Communication with Friends and Family: Not on file  . Frequency of Social Gatherings with Friends and Family: Not on file  . Attends Religious Services: Not on file  . Active Member of Clubs or Organizations: Not on file  . Attends Archivist Meetings: Not on file  . Marital Status: Not on file  Intimate Partner Violence:   . Fear of Current or Ex-Partner: Not on file  . Emotionally Abused: Not on file  . Physically Abused: Not on file  . Sexually Abused: Not on file     Family History  Problem Relation Age of Onset  . Stroke Father        July  [id number]: complicated by hemmrhage -- lost eyesight, memory loss  . Hypertension Other   . Heart disease Paternal Uncle   . Breast cancer Sister   . Heart disease Paternal Aunt        surgery  . Heart attack Maternal Uncle   . Colon cancer Neg Hx   . Prostate cancer Neg Hx      Allergies as of 04/12/2019   No Known Allergies     Medication List       Accurate as of April 12, 2019 11:59 PM. If you have any questions, ask your nurse or doctor.        STOP taking these medications   sildenafil 20 MG tablet Commonly known as: REVATIO Stopped by: Kathlene November, MD   Symbicort 160-4.5 MCG/ACT inhaler Generic drug: budesonide-formoterol Stopped by: Kathlene November, MD     TAKE these medications   amLODipine 5 MG tablet Commonly known as: NORVASC Take 1 tablet (5 mg total) by mouth daily. Started by: Kathlene November, MD   aspirin EC 81 MG  tablet Take 81 mg by mouth daily.   Fluticasone-Salmeterol 100-50 MCG/DOSE Aepb Commonly known as: Wixela Inhub Inhale 1 puff into the lungs 2 (two) times daily. Started by: Kathlene November, MD   metoprolol tartrate 50 MG tablet Commonly known as: LOPRESSOR Take 2 tablets (100 mg total) by mouth once for 1 dose. TAKE TWO HOUR PRIOR TO  SCHEDULE CARDAIC TEST   pantoprazole 40 MG tablet Commonly known as: PROTONIX Take 1 tablet (40 mg total) by mouth daily. Started by: Kathlene November, MD   ProAir RespiClick [id number] [phone] Base) MCG/ACT Aepb Generic drug: Albuterol Sulfate Inhale 2 puffs into the lungs every 6 (six) hours as needed.   rosuvastatin 20 MG tablet Commonly known as: CRESTOR Take 1 tablet (20 mg total) by mouth daily.           Objective:   Physical Exam BP (!) 168/88 (BP Location: Left Arm, Patient Position: Sitting, Cuff Size: Small)   Pulse 90   Temp (!) 96.8 F (36 C) (Temporal)   Resp 16   Ht 5\' 8"  (1.727 m)   Wt 170 lb 2 oz (77.2 kg)   SpO2 97%   BMI 25.87 kg/m  General: Well developed, NAD, BMI noted Neck: No  thyromegaly  HEENT:  Normocephalic . Face symmetric, atraumatic Lungs:  Expiratory time, rhonchi with cough only. Normal respiratory effort, no intercostal retractions, no accessory muscle use. Heart: RRR,  no murmur.  No pretibial edema bilaterally  Abdomen:  Not distended, soft, non-tender. No rebound or rigidity.   Skin: Exposed areas without rash. Not pale. Not jaundice Neurologic:  alert & oriented X3.  Speech normal, gait appropriate for age and unassisted Strength symmetric and appropriate for age.  Psych: Cognition and judgment appear intact.  Cooperative with normal attention span and concentration.  Behavior appropriate. No anxious or depressed appearing.     Assessment     Assessment GERD Hyperlipidemia Allergies , nasal  COPD, PFTs 2013 showed ratio of 58, FEV1 of 1.78- 52% without significant bronchodilator response, TLC was 77%  with DLCO 78% Smoker  PLAN: For CPX Elevated BP: No ambulatory BPs, start amlodipine GERD Currently on no medications, has occasional symptoms, recommend pantoprazole, call if not better Hyperlipidemia: On Crestor, checking labs COPD: Unable to afford Symbicort, ran out of albuterol.  he was doing better on medications.  Will prescribe Wixela (generic) 1 puff twice a day, use albuterol as needed. Left main  coronary artery disease: Per CT, saw cardiology, plan is to get a coronary CT angiogram but that has not happened, I am not sure if that was a insurance issue.  Will send a message to cardiology. ED: Sildenafil is very expensive RTC 2 months   Today in addition to the CPX,, I spent more than  20  min with the patient: >50% of the time counseling regards his chronic medical issues including elevated BP, GERD, hyperlipidemia, COPD and coordinating his care.     This visit occurred during the SARS-CoV-2 public health emergency.  Safety protocols were in place, including screening questions prior to the visit, additional usage of staff PPE, and extensive cleaning of exam room while observing appropriate contact time as indicated for disinfecting solutions.

## 2019-04-12 NOTE — Progress Notes (Signed)
Pre visit review using our clinic review tool, if applicable. No additional management support is needed unless otherwise documented below in the visit note. 

## 2019-04-12 NOTE — Patient Instructions (Addendum)
GO TO THE LAB : Get the blood work     GO TO THE FRONT DESK Schedule your next appointment   for a checkup in 2 months  Check your blood pressure weekly BP GOAL is between 110/65 and  135/85. If it is consistently higher or lower, let me know   For emphysema: Start Wixela twice a day.  This takes the place of Symbicort.  If you cannot afford it let me know. Albuterol: That is your rescue inhaler if you are coughing or wheezing  Your blood pressure is elevated, start amlodipine 5 mg 1 tablet daily, watch for swelling.

## 2019-04-13 LAB — COMPREHENSIVE METABOLIC PANEL
AG Ratio: 1.6 (calc) (ref 1.0–2.5)
ALT: 34 U/L (ref 9–46)
AST: 26 U/L (ref 10–35)
Albumin: 4.2 g/dL (ref 3.6–5.1)
Alkaline phosphatase (APISO): 73 U/L (ref 35–144)
BUN: 15 mg/dL (ref 7–25)
CO2: 29 mmol/L (ref 20–32)
Calcium: 9.8 mg/dL (ref 8.6–10.3)
Chloride: 104 mmol/L (ref 98–110)
Creat: 1 mg/dL (ref 0.70–1.33)
Globulin: 2.6 g/dL (calc) (ref 1.9–3.7)
Glucose, Bld: 93 mg/dL (ref 65–99)
Potassium: 4.7 mmol/L (ref 3.5–5.3)
Sodium: 141 mmol/L (ref 135–146)
Total Bilirubin: 0.3 mg/dL (ref 0.2–1.2)
Total Protein: 6.8 g/dL (ref 6.1–8.1)

## 2019-04-13 LAB — LIPID PANEL
Cholesterol: 171 mg/dL (ref <200)
HDL: 76 mg/dL (ref >=40)
LDL Cholesterol (Calc): 68 mg/dL (calc)
Non-HDL Cholesterol (Calc): 95 mg/dL (calc) (ref <130)
Total CHOL/HDL Ratio: 2.3 (calc) (ref <5.0)
Triglycerides: 200 mg/dL — ABNORMAL HIGH (ref <150)

## 2019-04-13 LAB — CBC WITH DIFFERENTIAL/PLATELET
Absolute Monocytes: 827 cells/uL (ref 200–950)
Basophils Absolute: 44 cells/uL (ref 0–200)
Basophils Relative: 0.5 %
Eosinophils Absolute: 229 cells/uL (ref 15–500)
Eosinophils Relative: 2.6 %
HCT: 40.7 % (ref 38.5–50.0)
Hemoglobin: 13.4 g/dL (ref 13.2–17.1)
Lymphs Abs: 1971 cells/uL (ref 850–3900)
MCH: 30.6 pg (ref 27.0–33.0)
MCHC: 32.9 g/dL (ref 32.0–36.0)
MCV: 92.9 fL (ref 80.0–100.0)
MPV: 10.2 fL (ref 7.5–12.5)
Monocytes Relative: 9.4 %
Neutro Abs: 5729 cells/uL (ref 1500–7800)
Neutrophils Relative %: 65.1 %
Platelets: 314 10*3/uL (ref 140–400)
RBC: 4.38 10*6/uL (ref 4.20–5.80)
RDW: 13.1 % (ref 11.0–15.0)
Total Lymphocyte: 22.4 %
WBC: 8.8 10*3/uL (ref 3.8–10.8)

## 2019-04-13 LAB — HEMOGLOBIN A1C
Hgb A1c MFr Bld: 5.8 % of total Hgb — ABNORMAL HIGH (ref <5.7)
Mean Plasma Glucose: 120 (calc)
eAG (mmol/L): 6.6 (calc)

## 2019-04-14 NOTE — Assessment & Plan Note (Signed)
For CPX Elevated BP: No ambulatory BPs, start amlodipine GERD Currently on no medications, has occasional symptoms, recommend pantoprazole, call if not better Hyperlipidemia: On Crestor, checking labs COPD: Unable to afford Symbicort, ran out of albuterol.  he was doing better on medications.  Will prescribe Wixela (generic) 1 puff twice a day, use albuterol as needed. Left main coronary artery disease: Per CT, saw cardiology, plan is to get a coronary CT angiogram but that has not happened, I am not sure if that was a insurance issue.  Will send a message to cardiology. ED: Sildenafil is very expensive RTC 2 months

## 2019-04-14 NOTE — Assessment & Plan Note (Signed)
-  Td 2013 -PNM 23 today -Shingrix: Will discuss next year  -had a  flu shot  -CCS-  cscope 07-2016, next per  GI -Prostate ca screening:  DRE and PSA 2019 okay.  Reassess next year - Tobacco:  Smoked 1 pack a day for 40 years, had a  negative CT chest for lung carcinoma screening, repeat CT Currently smoking: "1 or 2 cigarettes  a month", praised!  At the same time advised him to be very careful not to relapse. -Labs: CMP FLP CBC A1C -  Diet and exercise discussed

## 2019-04-16 ENCOUNTER — Other Ambulatory Visit: Payer: Self-pay

## 2019-04-16 ENCOUNTER — Ambulatory Visit (HOSPITAL_BASED_OUTPATIENT_CLINIC_OR_DEPARTMENT_OTHER)
Admission: RE | Admit: 2019-04-16 | Discharge: 2019-04-16 | Disposition: A | Discharge status: Home / Self Care | Payer: 59 | Source: Ambulatory Visit | Attending: Internal Medicine | Admitting: Internal Medicine

## 2019-04-16 DIAGNOSIS — Z122 Encounter for screening for malignant neoplasm of respiratory organs: Secondary | ICD-10-CM | POA: Insufficient documentation

## 2019-04-16 DIAGNOSIS — Z87891 Personal history of nicotine dependence: Secondary | ICD-10-CM | POA: Diagnosis present

## 2019-05-08 ENCOUNTER — Other Ambulatory Visit: Payer: Self-pay | Admitting: Cardiology

## 2019-05-08 DIAGNOSIS — Z20822 Contact with and (suspected) exposure to covid-19: Secondary | ICD-10-CM

## 2019-05-10 LAB — NOVEL CORONAVIRUS, NAA: SARS-CoV-2, NAA: NOT DETECTED

## 2019-06-14 ENCOUNTER — Ambulatory Visit: Payer: 59 | Admitting: Internal Medicine

## 2019-06-17 ENCOUNTER — Encounter: Payer: Self-pay | Admitting: Internal Medicine

## 2019-06-17 ENCOUNTER — Other Ambulatory Visit: Payer: Self-pay

## 2019-06-17 ENCOUNTER — Ambulatory Visit (INDEPENDENT_AMBULATORY_CARE_PROVIDER_SITE_OTHER): Payer: BC Managed Care – PPO | Admitting: Internal Medicine

## 2019-06-17 VITALS — BP 159/83 | HR 107 | Temp 96.8°F | Resp 18 | Ht 68.0 in | Wt 173.0 lb

## 2019-06-17 DIAGNOSIS — I10 Essential (primary) hypertension: Secondary | ICD-10-CM

## 2019-06-17 DIAGNOSIS — K111 Hypertrophy of salivary gland: Secondary | ICD-10-CM | POA: Diagnosis not present

## 2019-06-17 DIAGNOSIS — I2510 Atherosclerotic heart disease of native coronary artery without angina pectoris: Secondary | ICD-10-CM | POA: Diagnosis not present

## 2019-06-17 MED ORDER — AMLODIPINE BESYLATE 5 MG PO TABS: 5.0000 mg | ORAL_TABLET | Freq: Every day | ORAL | 1 refills | Status: DC

## 2019-06-17 MED ORDER — ROSUVASTATIN CALCIUM 20 MG PO TABS: 20.0000 mg | ORAL_TABLET | Freq: Every day | ORAL | 3 refills | Status: DC

## 2019-06-17 MED ORDER — METOPROLOL TARTRATE 50 MG PO TABS: 50.0000 mg | ORAL_TABLET | Freq: Two times a day (BID) | ORAL | 0 refills | Status: DC

## 2019-06-17 MED ORDER — SILDENAFIL CITRATE 20 MG PO TABS: 60.0000 mg | ORAL_TABLET | Freq: Every evening | ORAL | 0 refills | Status: DC | PRN

## 2019-06-17 MED ORDER — PANTOPRAZOLE SODIUM 40 MG PO TBEC: 40.0000 mg | DELAYED_RELEASE_TABLET | Freq: Every day | ORAL | 3 refills | Status: DC

## 2019-06-17 NOTE — Progress Notes (Signed)
Pre visit review using our clinic review tool, if applicable. No additional management support is needed unless otherwise documented below in the visit note. 

## 2019-06-17 NOTE — Telephone Encounter (Signed)
Opened in error

## 2019-06-17 NOTE — Patient Instructions (Signed)
  GO TO THE FRONT DESK Come back for a nurse visit in 3 weeks to check your blood pressure  Next office visit with me in 3 months  Take all your medications as prescribed.  Follow the medication list.  We will refer you to ENT  Please reach out to cardiology and schedule the CT of your heart  [address], [place] [address] [phone]  Start checking your blood pressures twice a week BP GOAL is between 110/65 and  135/85. If it is consistently higher or lower, let me know

## 2019-06-17 NOTE — Progress Notes (Signed)
Subjective:    Patient ID: Richard Gates, male    DOB: 17-Jan-1961, 59 y.o.   MRN: [id number]  DOS:  06/17/2019 Type of visit - description: Follow-up HTN: On amlodipine, BP today is elevated. He remains concerned about his prominent parotid glands   Wt Readings from Last 3 Encounters:  06/17/19 173 lb (78.5 kg)  04/12/19 170 lb 2 oz (77.2 kg)  08/17/18 165 lb (74.8 kg)      Review of Systems Denies fever chills No chest pain no lower extremity edema  Past Medical History:  Diagnosis Date  . COPD (chronic obstructive pulmonary disease) ([place])   . GERD (gastroesophageal reflux disease)     Past Surgical History:  Procedure Laterality Date  . APPENDECTOMY    . TRANSTHORACIC ECHOCARDIOGRAM  04/2018   EF 65-70% (vigorous). No RWMA.  Gr 1 DD.  Normal valves.  (In setting of COPD exacerbation)    Allergies as of 06/17/2019   No Known Allergies     Medication List       Accurate as of June 17, 2019  4:04 PM. If you have any questions, ask your nurse or doctor.        amLODipine 5 MG tablet Commonly known as: NORVASC Take 1 tablet (5 mg total) by mouth daily.   aspirin EC 81 MG tablet Take 81 mg by mouth daily.   Fluticasone-Salmeterol 100-50 MCG/DOSE Aepb Commonly known as: Wixela Inhub Inhale 1 puff into the lungs 2 (two) times daily.   metoprolol tartrate 50 MG tablet Commonly known as: LOPRESSOR Take 2 tablets (100 mg total) by mouth once for 1 dose. TAKE TWO HOUR PRIOR TO  SCHEDULE CARDAIC TEST   pantoprazole 40 MG tablet Commonly known as: PROTONIX Take 1 tablet (40 mg total) by mouth daily.   ProAir RespiClick [id number] (90 Base) MCG/ACT Aepb Generic drug: Albuterol Sulfate Inhale 2 puffs into the lungs every 6 (six) hours as needed.   rosuvastatin 20 MG tablet Commonly known as: CRESTOR Take 1 tablet (20 mg total) by mouth daily.             Objective:   Physical Exam BP (!) 159/83 (BP Location: Left Arm, Patient Position: Sitting, Cuff  Size: Small)   Pulse (!) 107   Temp (!) 96.8 F (36 C) (Temporal)   Resp 18   Ht 5\' 8"  (1.727 m)   Wt 173 lb (78.5 kg)   SpO2 94%   BMI 26.30 kg/m  General:   Well developed, NAD, BMI noted. HEENT:  Normocephalic . Face symmetric, atraumatic Prominent but symmetric parotid glands, not tender, not nodular. Lungs:  CTA B Normal respiratory effort, no intercostal retractions, no accessory muscle use. Heart: RRR,  no murmur.  Lower extremities: no pretibial edema bilaterally  Skin: Not pale. Not jaundice Neurologic:  alert & oriented X3.  Speech normal, gait appropriate for age and unassisted Psych--  Cognition and judgment appear intact.  Cooperative with normal attention span and concentration.  Behavior appropriate. No anxious or depressed appearing.      Assessment    Assessment GERD Hyperlipidemia Allergies , nasal  COPD, PFTs 2013 showed ratio of 58, FEV1 of 1.78- 52% without significant bronchodilator response, TLC was 77% with DLCO 78% Smoker  PLAN: HTN: Not well controlled, I check manually and obtain 160/90, no amb BP Plan: Add metoprolol 50 mg twice a day,  continue amlodipine, nurse visit in 3 weeks to check BPs, follow-up with me in 3 months Prominent parotid  glands: He again remains concerned, refer to ENT for a second opinion. Left main coronary artery disease: Coronary CT angiogram pending, encourage patient to reach out to cardiology and reschedule. ED: Request a refill on sildenafil, will do RTC 3 weeks for a nurse visit RTC 3 months office visit   This visit occurred during the SARS-CoV-2 public health emergency.  Safety protocols were in place, including screening questions prior to the visit, additional usage of staff PPE, and extensive cleaning of exam room while observing appropriate contact time as indicated for disinfecting solutions.

## 2019-06-19 NOTE — Assessment & Plan Note (Signed)
HTN: Not well controlled, I check manually and obtain 160/90, no amb BP Plan: Add metoprolol 50 mg twice a day,  continue amlodipine, nurse visit in 3 weeks to check BPs, follow-up with me in 3 months Prominent parotid glands: He again remains concerned, refer to ENT for a second opinion. Left main coronary artery disease: Coronary CT angiogram pending, encourage patient to reach out to cardiology and reschedule. ED: Request a refill on sildenafil, will do RTC 3 weeks for a nurse visit RTC 3 months office visit

## 2019-06-24 ENCOUNTER — Telehealth: Payer: Self-pay

## 2019-06-24 ENCOUNTER — Telehealth: Payer: Self-pay | Admitting: Internal Medicine

## 2019-06-24 NOTE — Telephone Encounter (Signed)
Spoke w/ Pt- informed of below. Pt verbalized understanding.  

## 2019-06-24 NOTE — Telephone Encounter (Signed)
Please advise 

## 2019-06-24 NOTE — Telephone Encounter (Signed)
PA initiated via Covermymeds; KEY: [id number]. Awaiting determination.

## 2019-06-24 NOTE — Telephone Encounter (Signed)
Advise patient, I will send a message to Dr. Ellyn Hack to see about rescheduling a  coronary CT angiogram

## 2019-06-24 NOTE — Telephone Encounter (Signed)
Please advise  Patient states that he call Cardiology on [facility] in [place] to make an appointment for a cardio score , per patient Cardiology office didn't know what he was talking about.

## 2019-06-26 NOTE — Telephone Encounter (Signed)
Med covered for pulm hypertension only.

## 2019-06-26 NOTE — Telephone Encounter (Signed)
PA denied.   CaseId:[id number];Status:Denied;Review Type:Prior Auth;Appeal Information: Attention:ATTN: [place] [id number]. [id number]; Important - Please read the below note on eAppeals: Please reference the denial letter for information on the rights for an appeal, rationale for the denial, and how to submit an appeal including if any information is needed to support the appeal. Note about urgent situations - Generally, an urgent situation is one which, in the opinion of the provider, the health of the patient may be in serious jeopardy or may experience pain that cannot be adequately controlled while waiting for a decision on the appeal.

## 2019-06-27 DIAGNOSIS — H401131 Primary open-angle glaucoma, bilateral, mild stage: Secondary | ICD-10-CM | POA: Diagnosis not present

## 2019-07-04 ENCOUNTER — Other Ambulatory Visit: Payer: Self-pay | Admitting: *Deleted

## 2019-07-04 DIAGNOSIS — R0609 Other forms of dyspnea: Secondary | ICD-10-CM

## 2019-07-04 DIAGNOSIS — R0602 Shortness of breath: Secondary | ICD-10-CM

## 2019-07-04 DIAGNOSIS — I2510 Atherosclerotic heart disease of native coronary artery without angina pectoris: Secondary | ICD-10-CM

## 2019-07-09 ENCOUNTER — Other Ambulatory Visit: Payer: Self-pay

## 2019-07-09 ENCOUNTER — Ambulatory Visit (INDEPENDENT_AMBULATORY_CARE_PROVIDER_SITE_OTHER): Payer: BC Managed Care – PPO | Admitting: Internal Medicine

## 2019-07-09 DIAGNOSIS — I10 Essential (primary) hypertension: Secondary | ICD-10-CM | POA: Diagnosis not present

## 2019-07-09 NOTE — Progress Notes (Addendum)
Pt here for Blood pressure check per Dr. Larose Kells  Pt currently takes: amlodipine 5 mg, metoprolol 50 mg bid.   Pt reports compliance with medication.  BP today @ =141/84 HR =73  Pt advised per Dr. Larose Kells continue current regimen, come back in 3 months.    BP Readings from Last 3 Encounters:  06/17/19 (!) 159/83  04/12/19 (!) 168/88  06/15/18 130/75   Kathlene November, MD

## 2019-08-01 DIAGNOSIS — I2510 Atherosclerotic heart disease of native coronary artery without angina pectoris: Secondary | ICD-10-CM

## 2019-08-01 HISTORY — DX: Atherosclerotic heart disease of native coronary artery without angina pectoris: I25.10

## 2019-08-09 ENCOUNTER — Telehealth: Payer: Self-pay | Admitting: *Deleted

## 2019-08-09 DIAGNOSIS — R0609 Other forms of dyspnea: Secondary | ICD-10-CM

## 2019-08-09 DIAGNOSIS — R0602 Shortness of breath: Secondary | ICD-10-CM

## 2019-08-09 DIAGNOSIS — I2510 Atherosclerotic heart disease of native coronary artery without angina pectoris: Secondary | ICD-10-CM

## 2019-08-09 NOTE — Telephone Encounter (Signed)
   Per scheduler : Patient scheduled for April 21,2021 4pm , he states he needs beta blocker called in for him to pharmacy. He also wants instructions sent to him in writing through [place] on what he is to do and where he is to go, he didn't have time to write it all down

## 2019-08-09 NOTE — Telephone Encounter (Signed)
Instruction for  CCTA were emailed through [place].

## 2019-08-20 ENCOUNTER — Telehealth (HOSPITAL_COMMUNITY): Payer: Self-pay | Admitting: Emergency Medicine

## 2019-08-20 NOTE — Telephone Encounter (Signed)
Attempted to call patient regarding upcoming cardiac CT appointment. °Left message on voicemail with name and callback number °Yvonda Fouty RN Navigator Cardiac Imaging °[facility] °[phone] Office °[phone] Cell ° °

## 2019-08-21 ENCOUNTER — Encounter: Payer: BC Managed Care – PPO | Admitting: *Deleted

## 2019-08-21 ENCOUNTER — Ambulatory Visit (HOSPITAL_COMMUNITY)
Admission: RE | Admit: 2019-08-21 | Discharge: 2019-08-21 | Disposition: A | Discharge status: Home / Self Care | Payer: BC Managed Care – PPO | Source: Ambulatory Visit | Attending: Cardiology | Admitting: Cardiology

## 2019-08-21 ENCOUNTER — Other Ambulatory Visit: Payer: Self-pay

## 2019-08-21 DIAGNOSIS — R0600 Dyspnea, unspecified: Secondary | ICD-10-CM | POA: Insufficient documentation

## 2019-08-21 DIAGNOSIS — R0602 Shortness of breath: Secondary | ICD-10-CM | POA: Diagnosis not present

## 2019-08-21 DIAGNOSIS — I700 Atherosclerosis of aorta: Secondary | ICD-10-CM | POA: Diagnosis not present

## 2019-08-21 DIAGNOSIS — I2510 Atherosclerotic heart disease of native coronary artery without angina pectoris: Secondary | ICD-10-CM | POA: Insufficient documentation

## 2019-08-21 DIAGNOSIS — R0609 Other forms of dyspnea: Secondary | ICD-10-CM

## 2019-08-21 DIAGNOSIS — Z006 Encounter for examination for normal comparison and control in clinical research program: Secondary | ICD-10-CM

## 2019-08-21 MED ORDER — METOPROLOL TARTRATE 5 MG/5ML IV SOLN
INTRAVENOUS | Status: AC
  Administered 2019-08-21: 5 mg via INTRAVENOUS
  Filled 2019-08-21: qty 10

## 2019-08-21 MED ORDER — NITROGLYCERIN 0.4 MG SL SUBL
SUBLINGUAL_TABLET | SUBLINGUAL | Status: AC
  Filled 2019-08-21: qty 2

## 2019-08-21 MED ORDER — IOHEXOL 350 MG/ML SOLN
80.0000 mL | Freq: Once | INTRAVENOUS | Status: AC | PRN
  Administered 2019-08-21: 80 mL via INTRAVENOUS

## 2019-08-21 MED ORDER — NITROGLYCERIN 0.4 MG SL SUBL
0.8000 mg | SUBLINGUAL_TABLET | Freq: Once | SUBLINGUAL | Status: AC
  Administered 2019-08-21: 0.8 mg via SUBLINGUAL

## 2019-08-21 MED ORDER — METOPROLOL TARTRATE 5 MG/5ML IV SOLN
5.0000 mg | INTRAVENOUS | Status: AC | PRN
  Administered 2019-08-21: 5 mg via INTRAVENOUS

## 2019-08-21 MED ORDER — DILTIAZEM HCL 25 MG/5ML IV SOLN
INTRAVENOUS | Status: AC
  Filled 2019-08-21: qty 5

## 2019-08-21 MED ORDER — METOPROLOL TARTRATE 5 MG/5ML IV SOLN
INTRAVENOUS | Status: AC
  Filled 2019-08-21: qty 5

## 2019-08-21 NOTE — Research (Signed)
CADFEM Informed Consent                  Subject Name:   Richard Gates. Richard Gates   Subject met inclusion and exclusion criteria.  The informed consent form, study requirements and expectations were reviewed with the subject and questions and concerns were addressed prior to the signing of the consent form.  The subject verbalized understanding of the trial requirements.  The subject agreed to participate in the CADFEM trial and signed the informed consent.  The informed consent was obtained prior to performance of any protocol-specific procedures for the subject.  A copy of the signed informed consent was given to the subject and a copy was placed in the subject's medical record.   [place] Twylia Oka, Research Assistant  08/21/2019  15:17 p.m.

## 2019-08-27 ENCOUNTER — Encounter: Payer: Self-pay | Admitting: Internal Medicine

## 2019-09-04 ENCOUNTER — Telehealth: Payer: Self-pay | Admitting: *Deleted

## 2019-09-04 NOTE — Telephone Encounter (Signed)
-----   Message from Leonie Man, MD sent at 08/22/2019 10:42 PM EDT ----- Coronary CT angiogram: Coronary calcium score 103.  This is just above the cutoff of 100 level.  Probably still relatively low risk.  His only calcium located in the Left Main Coronary Artery, but nonflow-limiting.  Otherwise there is some mild aortic atherosclerosis.  All told this is a relatively reassuring finding.  Would argue against any chest pain or pressure, from heart artery disease.  It does mean that we need to focus on risk factor modification such as blood sugar, cholesterol and blood pressure control.  Probably not enough to warrant taking baby aspirin.  Can discuss details and follow-up.  Glenetta Hew, MD

## 2019-09-04 NOTE — Telephone Encounter (Signed)
Patient reviewed via mychart. Called to left message to schedule a follow up visit to discuss and do annual visit . Left message to call back to schedule or use MyChart -scheduling

## 2019-09-11 ENCOUNTER — Telehealth: Payer: Self-pay

## 2019-09-11 ENCOUNTER — Other Ambulatory Visit: Payer: Self-pay | Admitting: Internal Medicine

## 2019-09-16 ENCOUNTER — Encounter: Payer: Self-pay | Admitting: Internal Medicine

## 2019-09-16 ENCOUNTER — Other Ambulatory Visit: Payer: Self-pay

## 2019-09-16 ENCOUNTER — Ambulatory Visit: Payer: BC Managed Care – PPO | Admitting: Internal Medicine

## 2019-09-16 VITALS — BP 126/55 | HR 88 | Temp 97.1°F | Resp 18 | Ht 68.0 in | Wt 175.4 lb

## 2019-09-16 DIAGNOSIS — I10 Essential (primary) hypertension: Secondary | ICD-10-CM

## 2019-09-16 DIAGNOSIS — I2510 Atherosclerotic heart disease of native coronary artery without angina pectoris: Secondary | ICD-10-CM

## 2019-09-16 DIAGNOSIS — R351 Nocturia: Secondary | ICD-10-CM | POA: Diagnosis not present

## 2019-09-16 DIAGNOSIS — E785 Hyperlipidemia, unspecified: Secondary | ICD-10-CM

## 2019-09-16 MED ORDER — SILDENAFIL CITRATE 20 MG PO TABS: 60.0000 mg | ORAL_TABLET | Freq: Every evening | ORAL | 3 refills | Status: DC | PRN

## 2019-09-16 NOTE — Progress Notes (Signed)
Subjective:    Patient ID: Richard Gates, male    DOB: 01/17/1961, 59 y.o.   MRN: [id number]  DOS:  09/16/2019 Type of visit - description: Follow-up Today we talk about hypertension, COPD.  I also did a PHQ-9: 8 show elevated results, he denies depression per se. The PHQ-9 score was elevated in part because he is feeling tired, he is overeating and he is falling asleep. On further questioning, he admits to snoring.  Finally, he reports nocturia for the last couple of months, going to the bathroom at night 3-4 times.  No problems during the daytime. Denies dysuria, gross hematuria or difficulty urinating.    Review of Systems See above   Past Medical History:  Diagnosis Date  . COPD (chronic obstructive pulmonary disease) ([place])   . GERD (gastroesophageal reflux disease)     Past Surgical History:  Procedure Laterality Date  . APPENDECTOMY    . TRANSTHORACIC ECHOCARDIOGRAM  04/2018   EF 65-70% (vigorous). No RWMA.  Gr 1 DD.  Normal valves.  (In setting of COPD exacerbation)    Allergies as of 09/16/2019   No Known Allergies     Medication List       Accurate as of Sep 16, 2019 11:59 PM. If you have any questions, ask your nurse or doctor.        amLODipine 5 MG tablet Commonly known as: NORVASC Take 1 tablet (5 mg total) by mouth daily.   aspirin EC 81 MG tablet Take 81 mg by mouth daily.   Fluticasone-Salmeterol 100-50 MCG/DOSE Aepb Commonly known as: Wixela Inhub Inhale 1 puff into the lungs 2 (two) times daily.   metoprolol tartrate 50 MG tablet Commonly known as: LOPRESSOR Take 1 tablet (50 mg total) by mouth 2 (two) times daily.   pantoprazole 40 MG tablet Commonly known as: PROTONIX Take 1 tablet (40 mg total) by mouth daily.   ProAir RespiClick [id number] (90 Base) MCG/ACT Aepb Generic drug: Albuterol Sulfate Inhale 2 puffs into the lungs every 6 (six) hours as needed.   rosuvastatin 20 MG tablet Commonly known as: CRESTOR Take 1 tablet (20 mg  total) by mouth daily.   sildenafil 20 MG tablet Commonly known as: REVATIO Take 3-4 tablets (60-80 mg total) by mouth at bedtime as needed.          Objective:   Physical Exam BP (!) 126/55 (BP Location: Left Arm, Patient Position: Sitting, Cuff Size: Normal)   Pulse 88   Temp (!) 97.1 F (36.2 C) (Temporal)   Resp 18   Ht 5\' 8"  (1.727 m)   Wt 175 lb 6 oz (79.5 kg)   SpO2 94%   BMI 26.67 kg/m  General:   Well developed, NAD, BMI noted.  HEENT:  Normocephalic . Face symmetric, atraumatic Lungs:  CTA B Normal respiratory effort, no intercostal retractions, no accessory muscle use. Heart: RRR,  no murmur.  Abdomen:  Not distended, soft, non-tender. No rebound or rigidity.   Skin: Not pale. Not jaundice DRE: Normal sphincter tone, no stools, prostate normal size, not tender.  Nonnodular. Lower extremities: no pretibial edema bilaterally  Neurologic:  alert & oriented X3.  Speech normal, gait appropriate for age and unassisted Psych--  Cognition and judgment appear intact.  Cooperative with normal attention span and concentration.  Behavior appropriate. No anxious or depressed appearing.     Assessment      Assessment HTN GERD Hyperlipidemia Allergies , nasal  COPD, PFTs 2013 showed ratio of 58,  FEV1 of 1.78- 52% without significant bronchodilator response, TLC was 77% with DLCO 78% Smoker  PLAN: HTN: Currently on amlodipine, metoprolol.  BP is very good today, ambulatory BPs improving compared to previous weeks although he could not give me a specific reading.  Continue present care. Hyperlipidemia: On Crestor, last LDL very good.  He is concerned about his diet, we had a long conversation about it, eventually we agreed to send him to a dietitian Hyperglycemia: Check A1c, refer to a dietitian Prominent parotid glands: See last visit, decided not to pursue ENT visit. Coronary CT angiogram: Chart reviewed, calcium score 103.0, results reviewed by cardiology,  probably still a low risk situation.  Plan is CV RF, aspirin not warranted at this point.  (He has been on aspirin for a while) Fatigue/snoring: PHQ-9 was elevated but he is not depressed, he did report fatigue and overeating.  Epworth scale: 7, average.  Continue monitoring the situation in the following months. Nocturia: As described above, check a UA, urine culture and PSA.  Further advised with results. ED: Has been unable to get sildenafil, printed prescription provided, I see no contraindication, again we talk about how to use it. RTC for a CPX December 2021  This visit occurred during the SARS-CoV-2 public health emergency.  Safety protocols were in place, including screening questions prior to the visit, additional usage of staff PPE, and extensive cleaning of exam room while observing appropriate contact time as indicated for disinfecting solutions.

## 2019-09-16 NOTE — Progress Notes (Signed)
Pre visit review using our clinic review tool, if applicable. No additional management support is needed unless otherwise documented below in the visit note. 

## 2019-09-16 NOTE — Patient Instructions (Signed)
Continue checking your blood pressures BP GOAL is between 110/65 and  135/85. If it is consistently higher or lower, let me know  GO TO THE LAB : Get the blood work     [place], [place] Come back for   a physical exam by December 2021.  Call sooner if needed

## 2019-09-17 ENCOUNTER — Ambulatory Visit (AMBULATORY_SURGERY_CENTER): Payer: Self-pay | Admitting: *Deleted

## 2019-09-17 ENCOUNTER — Other Ambulatory Visit: Payer: Self-pay

## 2019-09-17 VITALS — Temp 96.8°F | Ht 68.0 in | Wt 174.0 lb

## 2019-09-17 DIAGNOSIS — Z86010 Personal history of colonic polyps: Secondary | ICD-10-CM

## 2019-09-17 LAB — PSA: PSA: 0.32 ng/mL (ref 0.10–4.00)

## 2019-09-17 LAB — HEMOGLOBIN A1C: Hgb A1c MFr Bld: 6.1 % (ref 4.6–6.5)

## 2019-09-17 MED ORDER — SUTAB 1479-225-188 MG PO TABS: 1.0000 | ORAL_TABLET | ORAL | 0 refills | Status: DC

## 2019-09-17 NOTE — Addendum Note (Signed)
Addended by: Kelle Darting A on: 09/17/2019 04:19 PM   Modules accepted: Orders

## 2019-09-17 NOTE — Progress Notes (Signed)
Patient is here in-person for PV. Patient denies any allergies to eggs or soy. Patient denies any problems with anesthesia/sedation. Patient denies any oxygen use at home. Patient denies taking any diet/weight loss medications or blood thinners. Patient is not being treated for MRSA or C-diff. Patient is aware of our care-partner policy and 0000000 safety protocol. Completed covid vaccines x2 per pt on 08/07/19.  Sutab sample given to pt.

## 2019-09-17 NOTE — Assessment & Plan Note (Signed)
HTN: Currently on amlodipine, metoprolol.  BP is very good today, ambulatory BPs improving compared to previous weeks although he could not give me a specific reading.  Continue present care. Hyperlipidemia: On Crestor, last LDL very good.  He is concerned about his diet, we had a long conversation about it, eventually we agreed to send him to a dietitian Hyperglycemia: Check A1c, refer to a dietitian Prominent parotid glands: See last visit, decided not to pursue ENT visit. Coronary CT angiogram: Chart reviewed, calcium score 103.0, results reviewed by cardiology, probably still a low risk situation.  Plan is CV RF, aspirin not warranted at this point.  (He has been on aspirin for a while) Fatigue/snoring: PHQ-9 was elevated but he is not depressed, he did report fatigue and overeating.  Epworth scale: 7, average.  Continue monitoring the situation in the following months. Nocturia: As described above, check a UA, urine culture and PSA.  Further advised with results. ED: Has been unable to get sildenafil, printed prescription provided, I see no contraindication, again we talk about how to use it. RTC for a CPX December 2021

## 2019-09-18 ENCOUNTER — Other Ambulatory Visit (INDEPENDENT_AMBULATORY_CARE_PROVIDER_SITE_OTHER): Payer: BC Managed Care – PPO

## 2019-09-18 DIAGNOSIS — N390 Urinary tract infection, site not specified: Secondary | ICD-10-CM | POA: Diagnosis not present

## 2019-09-18 DIAGNOSIS — R351 Nocturia: Secondary | ICD-10-CM | POA: Diagnosis not present

## 2019-09-19 LAB — URINALYSIS, ROUTINE W REFLEX MICROSCOPIC
Ketones, ur: NEGATIVE
Leukocytes,Ua: NEGATIVE
Nitrite: NEGATIVE
Specific Gravity, Urine: >=1.03 — AB (ref 1.000–1.030)
Total Protein, Urine: 100 — AB
Urine Glucose: NEGATIVE
Urobilinogen, UA: 0.2 (ref 0.0–1.0)
pH: 5.5 (ref 5.0–8.0)

## 2019-09-20 ENCOUNTER — Encounter: Payer: Self-pay | Admitting: Internal Medicine

## 2019-09-20 LAB — URINE CULTURE
MICRO NUMBER:: 10496077
SPECIMEN QUALITY:: ADEQUATE

## 2019-09-20 MED ORDER — SULFAMETHOXAZOLE-TRIMETHOPRIM 800-160 MG PO TABS: 1.0000 | ORAL_TABLET | Freq: Two times a day (BID) | ORAL | 0 refills | Status: DC

## 2019-09-20 NOTE — Addendum Note (Signed)
Addended byDamita Dunnings D on: 09/20/2019 02:30 PM   Modules accepted: Orders

## 2019-10-01 ENCOUNTER — Other Ambulatory Visit: Payer: Self-pay

## 2019-10-01 ENCOUNTER — Ambulatory Visit (AMBULATORY_SURGERY_CENTER): Payer: BC Managed Care – PPO | Admitting: Internal Medicine

## 2019-10-01 ENCOUNTER — Encounter: Payer: Self-pay | Admitting: Internal Medicine

## 2019-10-01 VITALS — BP 149/85 | HR 74 | Temp 96.8°F | Resp 18 | Ht 68.0 in | Wt 174.0 lb

## 2019-10-01 DIAGNOSIS — D123 Benign neoplasm of transverse colon: Secondary | ICD-10-CM

## 2019-10-01 DIAGNOSIS — D128 Benign neoplasm of rectum: Secondary | ICD-10-CM | POA: Diagnosis not present

## 2019-10-01 DIAGNOSIS — D120 Benign neoplasm of cecum: Secondary | ICD-10-CM

## 2019-10-01 DIAGNOSIS — K635 Polyp of colon: Secondary | ICD-10-CM | POA: Diagnosis not present

## 2019-10-01 DIAGNOSIS — Z1211 Encounter for screening for malignant neoplasm of colon: Secondary | ICD-10-CM | POA: Diagnosis not present

## 2019-10-01 DIAGNOSIS — D124 Benign neoplasm of descending colon: Secondary | ICD-10-CM | POA: Diagnosis not present

## 2019-10-01 DIAGNOSIS — Z86010 Personal history of colonic polyps: Secondary | ICD-10-CM

## 2019-10-01 MED ORDER — SODIUM CHLORIDE 0.9 % IV SOLN: 500.0000 mL | INTRAVENOUS | Status: DC

## 2019-10-01 NOTE — Progress Notes (Signed)
Vs CW I have reviewed the patient's medical history in detail and updated the computerized patient record.   

## 2019-10-01 NOTE — Progress Notes (Signed)
Called to room to assist during endoscopic procedure.  Patient ID and intended procedure confirmed with present staff. Received instructions for my participation in the procedure from the performing physician.  

## 2019-10-01 NOTE — Op Note (Signed)
[facility] Patient Name: Richard Gates Procedure Date: 10/01/2019 8:29 AM MRN: [id number] Endoscopist: Jerene Bears , MD Age: 59 Referring MD:  Date of Birth: 10-23-1960 Gender: Male Account #: [id number] Procedure:                Colonoscopy Indications:              High risk colon cancer surveillance: Personal                            history of multiple (3 or more) adenomas, Last                            colonoscopy: April 2018 Medicines:                Monitored Anesthesia Care Procedure:                Pre-Anesthesia Assessment:                           - Prior to the procedure, a History and Physical                            was performed, and patient medications and                            allergies were reviewed. The patient's tolerance of                            previous anesthesia was also reviewed. The risks                            and benefits of the procedure and the sedation                            options and risks were discussed with the patient.                            All questions were answered, and informed consent                            was obtained. Prior Anticoagulants: The patient has                            taken no previous anticoagulant or antiplatelet                            agents. ASA Grade Assessment: II - A patient with                            mild systemic disease. After reviewing the risks                            and benefits, the patient was deemed in  satisfactory condition to undergo the procedure.                           After obtaining informed consent, the colonoscope                            was passed under direct vision. Throughout the                            procedure, the patient's blood pressure, pulse, and                            oxygen saturations were monitored continuously. The                            Colonoscope was introduced through the anus and                             advanced to the cecum, identified by appendiceal                            orifice and ileocecal valve. The colonoscopy was                            performed without difficulty. The patient tolerated                            the procedure well. The quality of the bowel                            preparation was good. The ileocecal valve,                            appendiceal orifice, and rectum were photographed. Scope In: 8:40:41 AM Scope Out: 8:59:22 AM Scope Withdrawal Time: 0 hours 16 minutes 38 seconds  Total Procedure Duration: 0 hours 18 minutes 41 seconds  Findings:                 The digital rectal exam was normal.                           A 4 mm polyp was found in the cecum. The polyp was                            sessile. The polyp was removed with a cold snare.                            Resection and retrieval were complete.                           Two sessile polyps were found in the transverse                            colon. The polyps were 4 to  5 mm in size. These                            polyps were removed with a cold snare. Resection                            and retrieval were complete.                           A 5 mm polyp was found in the descending colon. The                            polyp was sessile. The polyp was removed with a                            cold snare. Resection and retrieval were complete.                           A 3 mm polyp was found in the distal rectum. The                            polyp was sessile. The polyp was removed with a                            cold snare. Resection and retrieval were complete.                           Many small and large-mouthed diverticula were found                            from cecum to sigmoid colon.                           Internal hemorrhoids were found during retroflexion. Complications:            No immediate complications. Estimated Blood Loss:      Estimated blood loss was minimal. Impression:               - One 4 mm polyp in the cecum, removed with a cold                            snare. Resected and retrieved.                           - Two 4 to 5 mm polyps in the transverse colon,                            removed with a cold snare. Resected and retrieved.                           - One 5 mm polyp in the descending colon, removed  with a cold snare. Resected and retrieved.                           - One 3 mm polyp in the distal rectum, removed with                            a cold snare. Resected and retrieved.                           - Diverticulosis from cecum to sigmoid colon.                           - Internal hemorrhoids. Recommendation:           - Patient has a contact number available for                            emergencies. The signs and symptoms of potential                            delayed complications were discussed with the                            patient. Return to normal activities tomorrow.                            Written discharge instructions were provided to the                            patient.                           - Resume previous diet.                           - Continue present medications.                           - Await pathology results.                           - Repeat colonoscopy is recommended for                            surveillance. The colonoscopy date will be                            determined after pathology results from today's                            exam become available for review. Jerene Bears, MD 10/01/2019 9:03:08 AM This report has been signed electronically.

## 2019-10-01 NOTE — Patient Instructions (Signed)
Handout on polyps, diverticulosis, and hemorrhoids given. ° °YOU HAD AN ENDOSCOPIC PROCEDURE TODAY AT THE [facility]:   Refer to the procedure report that was given to you for any specific questions about what was found during the examination.  If the procedure report does not answer your questions, please call your gastroenterologist to clarify.  If you requested that your care partner not be given the details of your procedure findings, then the procedure report has been included in a sealed envelope for you to review at your convenience later. ° °YOU SHOULD EXPECT: Some feelings of bloating in the abdomen. Passage of more gas than usual.  Walking can help get rid of the air that was put into your GI tract during the procedure and reduce the bloating. If you had a lower endoscopy (such as a colonoscopy or flexible sigmoidoscopy) you may notice spotting of blood in your stool or on the toilet paper. If you underwent a bowel prep for your procedure, you may not have a normal bowel movement for a few days. ° °Please Note:  You might notice some irritation and congestion in your nose or some drainage.  This is from the oxygen used during your procedure.  There is no need for concern and it should clear up in a day or so. ° °SYMPTOMS TO REPORT IMMEDIATELY: ° °Following lower endoscopy (colonoscopy or flexible sigmoidoscopy): ° Excessive amounts of blood in the stool ° Significant tenderness or worsening of abdominal pains ° Swelling of the abdomen that is new, acute ° Fever of 100°F or higher ° °For urgent or emergent issues, a gastroenterologist can be reached at any hour by calling [phone]. °Do not use MyChart messaging for urgent concerns.  ° ° °DIET:  We do recommend a small meal at first, but then you may proceed to your regular diet.  Drink plenty of fluids but you should avoid alcoholic beverages for 24 hours. ° °ACTIVITY:  You should plan to take it easy for the rest of today and you should  NOT DRIVE or use heavy machinery until tomorrow (because of the sedation medicines used during the test).   ° °FOLLOW UP: °Our staff will call the number listed on your records 48-72 hours following your procedure to check on you and address any questions or concerns that you may have regarding the information given to you following your procedure. If we do not reach you, we will leave a message.  We will attempt to reach you two times.  During this call, we will ask if you have developed any symptoms of COVID 19. If you develop any symptoms (ie: fever, flu-like symptoms, shortness of breath, cough etc.) before then, please call [phone].  If you test positive for Covid 19 in the 2 weeks post procedure, please call and report this information to us.   ° °If any biopsies were taken you will be contacted by phone or by letter within the next 1-3 weeks.  Please call us at [phone] if you have not heard about the biopsies in 3 weeks.  ° ° °SIGNATURES/CONFIDENTIALITY: °You and/or your care partner have signed paperwork which will be entered into your electronic medical record.  These signatures attest to the fact that that the information above on your After Visit Summary has been reviewed and is understood.  Full responsibility of the confidentiality of this discharge information lies with you and/or your care-partner.  °

## 2019-10-01 NOTE — Progress Notes (Signed)
Report to PACU, RN, vss, BBS= Clear.  

## 2019-10-03 ENCOUNTER — Telehealth: Payer: Self-pay | Admitting: *Deleted

## 2019-10-03 NOTE — Telephone Encounter (Signed)
  Follow up Call-  Call back number 10/01/2019  Post procedure Call Back phone  # [phone]  Permission to leave phone message Yes  Some recent data might be hidden     Patient questions:  Do you have a fever, pain , or abdominal swelling? No. Pain Score  0 *  Have you tolerated food without any problems? Yes.    Have you been able to return to your normal activities? Yes.    Do you have any questions about your discharge instructions: Diet   No. Medications  No. Follow up visit  No.  Do you have questions or concerns about your Care? No.  Actions: * If pain score is 4 or above: No action needed, pain <4.   1. Have you developed a fever since your procedure? no  2.   Have you had an respiratory symptoms (SOB or cough) since your procedure? no  3.   Have you tested positive for COVID 19 since your procedure no  4.   Have you had any family members/close contacts diagnosed with the COVID 19 since your procedure?  no   If yes to any of these questions please route to Joylene John, RN and Erenest Rasher, RN

## 2019-10-06 ENCOUNTER — Encounter: Payer: Self-pay | Admitting: Internal Medicine

## 2019-10-15 ENCOUNTER — Encounter: Payer: BC Managed Care – PPO | Admitting: Internal Medicine

## 2019-10-24 ENCOUNTER — Telehealth: Payer: Self-pay | Admitting: Internal Medicine

## 2019-10-24 ENCOUNTER — Other Ambulatory Visit (INDEPENDENT_AMBULATORY_CARE_PROVIDER_SITE_OTHER): Payer: BC Managed Care – PPO

## 2019-10-24 ENCOUNTER — Other Ambulatory Visit: Payer: Self-pay

## 2019-10-24 DIAGNOSIS — N390 Urinary tract infection, site not specified: Secondary | ICD-10-CM | POA: Diagnosis not present

## 2019-10-24 DIAGNOSIS — R319 Hematuria, unspecified: Secondary | ICD-10-CM | POA: Diagnosis not present

## 2019-10-24 MED ORDER — PROAIR RESPICLICK 108 (90 BASE) MCG/ACT IN AEPB: 2.0000 | INHALATION_SPRAY | Freq: Four times a day (QID) | RESPIRATORY_TRACT | 6 refills | Status: DC | PRN

## 2019-10-24 NOTE — Telephone Encounter (Signed)
Medication:Albuterol Sulfate (PROAIR RESPICLICK) 870 (90 Base) MCG/ACT AEPB   Has the patient contacted their pharmacy? No. (If no, request that the patient contact the pharmacy for the refill.) (If yes, when and what did the pharmacy advise?)  Preferred Pharmacy (with phone number or street name):   [facility] [address], [address]  [place], [place] [address]  Phone:  [phone] Fax:  [phone]  Agent: Please be advised that RX refills may take up to 3 business days. We ask that you follow-up with your pharmacy.

## 2019-10-24 NOTE — Telephone Encounter (Signed)
Rx sent 

## 2019-10-25 LAB — URINALYSIS, ROUTINE W REFLEX MICROSCOPIC
Bilirubin Urine: NEGATIVE
Ketones, ur: NEGATIVE
Leukocytes,Ua: NEGATIVE
Nitrite: NEGATIVE
Specific Gravity, Urine: >=1.03 — AB (ref 1.000–1.030)
Total Protein, Urine: 100 — AB
Urine Glucose: NEGATIVE
Urobilinogen, UA: 0.2 (ref 0.0–1.0)
pH: 6 (ref 5.0–8.0)

## 2019-10-25 LAB — URINE CULTURE
MICRO NUMBER:: 10630244
Result:: NO GROWTH
SPECIMEN QUALITY:: ADEQUATE

## 2019-10-29 ENCOUNTER — Encounter: Payer: Self-pay | Admitting: Dietician

## 2019-10-29 ENCOUNTER — Telehealth: Payer: Self-pay | Admitting: Internal Medicine

## 2019-10-29 ENCOUNTER — Encounter: Payer: BC Managed Care – PPO | Attending: Internal Medicine | Admitting: Dietician

## 2019-10-29 ENCOUNTER — Other Ambulatory Visit: Payer: Self-pay

## 2019-10-29 DIAGNOSIS — E785 Hyperlipidemia, unspecified: Secondary | ICD-10-CM

## 2019-10-29 MED ORDER — ALBUTEROL SULFATE HFA 108 (90 BASE) MCG/ACT IN AERS: 2.0000 | INHALATION_SPRAY | Freq: Four times a day (QID) | RESPIRATORY_TRACT | 5 refills | Status: DC | PRN

## 2019-10-29 NOTE — Telephone Encounter (Signed)
Will try to send Ventolin.

## 2019-10-29 NOTE — Progress Notes (Signed)
Medical Nutrition Therapy   Primary concerns today: weight management   Referral diagnosis: E78.5- hyperlipidemia Preferred learning style: no preference indicated Learning readiness: contemplating    NUTRITION ASSESSMENT   Anthropometrics  Weight: 173 lbs    Clinical Medical Hx: HTN, hyperlipidemia, GERD, COPD, prediabetes  Labs: A1c 6.1%  Notable Signs/Symptoms: reported leg swelling  Lifestyle & Dietary Hx NKFA, however suspects sausage causes leg swelling. Typical meal pattern is 1 meal per day because trying to lose weight. States he used to be able to eat whatever he wanted and maintain usual body weight of ~140 lbs. States recently he had been eating a lot more and noticed weight gain. Foods also included primarily hot dogs, hamburgers, pizza, meatloaf, and eggs with bacon or sausage. Drinks primarily soda and Gatorade, rarely water.   Supplements: none  Sleep: 3-4 hours per night, disrupted sleep Stress / self-care: home, work  Current average weekly physical activity: very active at work  24-Hr Dietary Recall First Meal: - Snack: chips  Second Meal: salad (or tuna sandwich)  Snack: cheese crackers (or peanut butter crackers)  Third Meal: -  Snack: - Beverages: soda, Gatorade, sometimes water   NUTRITION DIAGNOSIS  Food and nutrition related knowledge deficit (NB-1.1) related to lack of prior nutrition education as evidenced by questions regarding healthful eating for weight loss.    NUTRITION INTERVENTION  Nutrition education (E-1) on the following topics:  . General balanced eating for meals (inlcuding vegetables, complex carbs, and protein), breakfast, and snacks  . Weight management tips including importance of water, protein, and fiber intake, as well as mindfulness to guide eating when hungry and stopping when full  . Reminder of multiple factors that influence weight including diet, exercise, family history, stress, sleep, medication, medical history, etc.   . Avoidance of processed and fast foods/ restaurant foods to help decrease sodium intake  Handouts Provided Include   MyPlate Portions and Meal Ideas   Balanced Snacks  Breakfast Ideas   Learning Style & Readiness for Change Teaching method utilized: Visual & Auditory  Demonstrated degree of understanding via: Teach Back  Barriers to learning/adherence to lifestyle change: None Identified     MONITORING & EVALUATION Dietary intake, weekly physical activity, and nutrition-related labs PRN.  Next Steps  Patient is to contact [facility] for follow up visit as needed.

## 2019-10-29 NOTE — Telephone Encounter (Signed)
per pharmacy, patient's insurance will no longer cover the :Albuterol Sulfate ([place]) 530 (90 Base) MCG/ACT AEPB  Patient is asking for a alternative medication sent to pharmacy.  Please Advise

## 2019-11-08 ENCOUNTER — Encounter: Payer: Self-pay | Admitting: Cardiology

## 2019-11-08 ENCOUNTER — Other Ambulatory Visit: Payer: Self-pay

## 2019-11-08 ENCOUNTER — Ambulatory Visit (INDEPENDENT_AMBULATORY_CARE_PROVIDER_SITE_OTHER): Payer: BC Managed Care – PPO | Admitting: Cardiology

## 2019-11-08 VITALS — BP 120/74 | HR 87 | Ht 68.0 in | Wt 174.0 lb

## 2019-11-08 DIAGNOSIS — E785 Hyperlipidemia, unspecified: Secondary | ICD-10-CM

## 2019-11-08 DIAGNOSIS — Z720 Tobacco use: Secondary | ICD-10-CM

## 2019-11-08 DIAGNOSIS — I2510 Atherosclerotic heart disease of native coronary artery without angina pectoris: Secondary | ICD-10-CM | POA: Diagnosis not present

## 2019-11-08 DIAGNOSIS — I10 Essential (primary) hypertension: Secondary | ICD-10-CM

## 2019-11-08 NOTE — Assessment & Plan Note (Signed)
Blood pressure is well controlled today.  He is having a little bit of fatigue issues and I thought maybe he could benefit from a slight reduction in his metoprolol dose.  I did explain to him that mild lower extremity swelling could be a side effect of amlodipine.  Recommendations:   Continue amlodipine  Continue beta-blocker, but will do a trial of 2-week reducing his morning dose to 1/2 tablet (25 mg) to see if this helps his energy level.  If it does, I recommend he stays on that dose.  If additional blood pressure control is needed, would consider adding diuretic

## 2019-11-08 NOTE — Assessment & Plan Note (Addendum)
He actually has quit smoking.  I congratulated him on his efforts.

## 2019-11-08 NOTE — Assessment & Plan Note (Signed)
Again, excellent results on coronary CTA.  Minimal CAD in the left main as indicated. He is on excellent therapy for minimal CAD--however with it being left main, I do think that being more aggressive is warranted.  Plan: Continue statin and beta-blocker.  Gentle blocker provides another potential antianginal medication. = Aspirin actually is debatable based on relatively low coronary calcium score.  No clear data to suggest benefit versus risk.

## 2019-11-08 NOTE — Progress Notes (Signed)
Primary Care Provider: Colon Branch, MD Cardiologist: Glenetta Hew, MD Electrophysiologist: None  Clinic Note: Chief Complaint  Patient presents with  . Follow-up    Delayed annual follow-up; finally had coronary CTA done     HPI:    Richard Gates is a 59 y.o. male with a history of COPD found to have coronary artery calcification on screening CT scan who presents today for delayed annual follow-up.  He was initially seen at the request of Colon Branch, MD after screening CT scan showed left main coronary calcification.  He did have some troponin elevation during it COPD exacerbation in 2019 with associated tachypnea and hypoxia.Richard Gates was last seen on August 17, 2018 via telemedicine.  He only noted chronic exertional dyspnea but no chest pain or pressure.  Chronic cough.  Coronary CT angiogram was ordered, but not yet done at the time of this visit.  Recommended continued statin, and anticipated use of bisoprolol.  Recent Hospitalizations: None  Reviewed  CV studies:    The following studies were reviewed today: (if available, images/films reviewed: From Epic Chart or Care Everywhere) . Coronary CT Angiogram-FFR (08/21/2019): Coronary calcium score 103.  Short left main with calcified eccentric plaque <25% (minimal) -> no significant disease either LAD or LCx branch.  RCA.  Aortic atherosclerosis noted with no dissection.  Normal aortic diameter.  No evidence of aortic valve calcification; (left main CAD)   Interval History:   Richard Gates returns today to discuss results of his Coronary CTA that was finally done.  He says that he is doing fine no major issues.  He has a chronic exertional dyspnea with no real change from baseline.  No chest pain or pressure at rest or exertion.  He does sometimes feel a bit tired and fatigued during the day with a hard time "getting going but otherwise no real significant exercise intolerance.   CV Review of Symptoms  (Summary) Cardiovascular ROS: positive for - dyspnea on exertion and Mild swelling.  Mild fatigue. negative for - chest pain, irregular heartbeat, orthopnea, palpitations, paroxysmal nocturnal dyspnea, rapid heart rate, shortness of breath or Syncope/near syncope, TIA/amaurosis fugax.  Lightheadedness, dizziness or wooziness.  Claudication  The patient does not have symptoms concerning for COVID-19 infection (fever, chills, cough, or new shortness of breath).  The patient is practicing social distancing & Masking.    REVIEWED OF SYSTEMS   Review of Systems  Constitutional: Positive for malaise/fatigue (A little bit tired with some lack of "get up and go "). Negative for weight loss.  HENT: Negative for nosebleeds.   Respiratory: Positive for shortness of breath (Baseline).   Gastrointestinal: Negative for abdominal pain, blood in stool and melena.  Genitourinary: Negative for hematuria.  Musculoskeletal: Negative for joint pain.  Neurological: Negative for dizziness and focal weakness.  Psychiatric/Behavioral: Negative.     I have reviewed and (if needed) personally updated the patient's problem list, medications, allergies, past medical and surgical history, social and family history.   PAST MEDICAL HISTORY   Past Medical History:  Diagnosis Date  . COPD (chronic obstructive pulmonary disease) ([place])   . Coronary artery calcification seen on CAT scan 08/2019   Coronary calcium score 103; short LM with<25% mixed calcific plaque (minimal); aortic atherosclerosis with normal size.  No dissection.  No aortic valve calcification  . GERD (gastroesophageal reflux disease)   . Hyperlipidemia   . Hypertension     PAST SURGICAL HISTORY   Past  Surgical History:  Procedure Laterality Date  . APPENDECTOMY    . COLONOSCOPY  08/24/2016  . POLYPECTOMY    . TRANSTHORACIC ECHOCARDIOGRAM  04/2018   EF 65-70% (vigorous). No RWMA.  Gr 1 DD.  Normal valves.  (In setting of COPD exacerbation)     MEDICATIONS/ALLERGIES   Current Meds  Medication Sig  . albuterol (VENTOLIN HFA) 108 (90 Base) MCG/ACT inhaler Inhale 2 puffs into the lungs every 6 (six) hours as needed for wheezing or shortness of breath.  Marland Kitchen amLODipine (NORVASC) 5 MG tablet Take 1 tablet (5 mg total) by mouth daily.  Marland Kitchen aspirin EC 81 MG tablet Take 81 mg by mouth daily.  . Fluticasone-Salmeterol (WIXELA INHUB) 100-50 MCG/DOSE AEPB Inhale 1 puff into the lungs 2 (two) times daily.  . metoprolol tartrate (LOPRESSOR) 50 MG tablet Take 1 tablet (50 mg total) by mouth 2 (two) times daily.  . pantoprazole (PROTONIX) 40 MG tablet Take 1 tablet (40 mg total) by mouth daily.  . rosuvastatin (CRESTOR) 20 MG tablet Take 1 tablet (20 mg total) by mouth daily.  . sildenafil (REVATIO) 20 MG tablet Take 3-4 tablets (60-80 mg total) by mouth at bedtime as needed.  . Sodium Sulfate-Mag Sulfate-KCl (SUTAB) [phone] MG TABS Take 1 kit by mouth as directed. Lot [id number], exp. 2/23  . sulfamethoxazole-trimethoprim (BACTRIM DS) 800-160 MG tablet Take 1 tablet by mouth 2 (two) times daily.    No Known Allergies  SOCIAL HISTORY/FAMILY HISTORY   Reviewed in Epic:  Pertinent findings: No notable change  OBJCTIVE -PE, EKG, labs   Wt Readings from Last 3 Encounters:  11/08/19 174 lb (78.9 kg)  10/29/19 173 lb (78.5 kg)  10/01/19 174 lb (78.9 kg)    Physical Exam: BP 120/74   Pulse 87   Ht 5\' 8"  (1.727 m)   Wt 174 lb (78.9 kg)   SpO2 92%   BMI 26.46 kg/m  Physical Exam Constitutional:      General: He is not in acute distress.    Appearance: Normal appearance. He is normal weight.  HENT:     Head: Normocephalic and atraumatic.  Neck:     Vascular: No carotid bruit.  Cardiovascular:     Rate and Rhythm: Normal rate and regular rhythm.     Pulses: Normal pulses.     Heart sounds: Normal heart sounds. No murmur heard.  No friction rub. No gallop.   Pulmonary:     Effort: Pulmonary effort is normal.     Breath sounds:  Normal breath sounds.  Musculoskeletal:        General: Swelling (Trace ankle edema) present. Normal range of motion.     Cervical back: Normal range of motion and neck supple. No rigidity.  Neurological:     General: No focal deficit present.     Mental Status: He is alert and oriented to person, place, and time.  Psychiatric:        Mood and Affect: Mood normal.        Behavior: Behavior normal.        Thought Content: Thought content normal.        Judgment: Judgment normal.     Adult ECG Report Not checked  Recent Labs: N/A Lab Results  Component Value Date   CHOL 171 04/12/2019   HDL 76 04/12/2019   LDLCALC 68 04/12/2019   LDLDIRECT 108.0 04/03/2018   TRIG 200 (H) 04/12/2019   CHOLHDL 2.3 04/12/2019   Lab Results  Component Value Date  CREATININE 1.00 04/12/2019   BUN 15 04/12/2019   NA 141 04/12/2019   K 4.7 04/12/2019   CL 104 04/12/2019   CO2 29 04/12/2019   Lab Results  Component Value Date   TSH 1.44 04/03/2018    ASSESSMENT/PLAN    Problem List Items Addressed This Visit    Hyperlipidemia with target LDL less than 70 (Chronic)    With left main disease, with a target less than 70, however now results of his coronary CTA, really target would be less than 100.  Currently he actually is less than 70 on modest dose of statin.  Would simply continue statin.      Hypertension - Primary (Chronic)    Blood pressure is well controlled today.  He is having a little bit of fatigue issues and I thought maybe he could benefit from a slight reduction in his metoprolol dose.  I did explain to him that mild lower extremity swelling could be a side effect of amlodipine.  Recommendations:   Continue amlodipine  Continue beta-blocker, but will do a trial of 2-week reducing his morning dose to 1/2 tablet (25 mg) to see if this helps his energy level.  If it does, I recommend he stays on that dose.  If additional blood pressure control is needed, would consider adding  diuretic        Left main coronary artery disease (Chronic)    Coronary calcium was seen only on the left main, but with less than 25% stenosis/minimal, but only recommendation will be to continue his factor modification.  No significant disease noted.  Very good result.  Plan: He is already on aspirin and statin as well as beta-blocker and amlodipine. With a statin dose to be added last year, his lipids are now well at goal with LDL less than 70.  Blood pressure is well controlled having added amlodipine and metoprolol.  He is currently being managed well by his PCP.  We discussed following up with cardiology versus just with his PCP and he is comfortable with his PCP.      Coronary artery calcification seen on computed tomography (Chronic)    Again, excellent results on coronary CTA.  Minimal CAD in the left main as indicated. He is on excellent therapy for minimal CAD--however with it being left main, I do think that being more aggressive is warranted.  Plan: Continue statin and beta-blocker.  Gentle blocker provides another potential antianginal medication. = Aspirin actually is debatable based on relatively low coronary calcium score.  No clear data to suggest benefit versus risk.      Tobacco abuse    He actually has quit smoking.  I congratulated him on his efforts.          COVID-19 Education: The signs and symptoms of COVID-19 were discussed with the patient and how to seek care for testing (follow up with PCP or arrange E-visit).   The importance of social distancing and COVID-19 vaccination was discussed today.  I spent a total of 18 minutes with the patient. >  50% of the time was spent in direct patient consultation.  Additional time spent with chart review  / charting (studies, outside notes, etc): 8 Total Time: 26 min   Current medicines are reviewed at length with the patient today.  (+/- concerns) none  Notice: This dictation was prepared with Dragon  dictation along with smaller phrase technology. Any transcriptional errors that result from this process are unintentional and may not be  corrected upon review.  Patient Instructions / Medication Changes & Studies & Tests Ordered   Patient Instructions  Medication Instructions:   try taking morning dos of Metoprolol  1/2 tablet to see how you energy level is for 2 weeks- If energy  Better let your primary know.   *If you need a refill on your cardiac medications before your next appointment, please call your pharmacy*   Lab Work: Not needed   Testing/Procedures: Not needed   Follow-Up: At [facility], you and your health needs are our priority.  As part of our continuing mission to provide you with exceptional heart care, we have created designated Provider Care Teams.  These Care Teams include your primary Cardiologist (physician) and Advanced Practice Providers (APPs -  Physician Assistants and Nurse Practitioners) who all work together to provide you with the care you need, when you need it.  We recommend signing up for the patient portal called "MyChart".  Sign up information is provided on this After Visit Summary.  MyChart is used to connect with patients for Virtual Visits (Telemedicine).  Patients are able to view lab/test results, encounter notes, upcoming appointments, etc.  Non-urgent messages can be sent to your provider as well.   To learn more about what you can do with MyChart, go to [email or web address].    Your next appointment:    as needed  The format for your next appointment:   Either In Person or Virtual  Provider:   You may see Glenetta Hew, MD or one of the following Advanced Practice Providers on your designated Care Team:    Rosaria Ferries, PA-C  Jory Sims, DNP, ANP  Cadence Kathlen Mody, PA-C       Studies Ordered:   No orders of the defined types were placed in this encounter.    Glenetta Hew, M.D., M.S. Interventional  Cardiologist   Pager # [phone] Phone # [phone] [address] [place], [place] [address]   Thank you for choosing Heartcare at [facility]!!

## 2019-11-08 NOTE — Patient Instructions (Signed)
Medication Instructions:   try taking morning dos of Metoprolol  1/2 tablet to see how you energy level is for 2 weeks- If energy  Better let your primary know.   *If you need a refill on your cardiac medications before your next appointment, please call your pharmacy*   Lab Work: Not needed   Testing/Procedures: Not needed   Follow-Up: At [facility], you and your health needs are our priority.  As part of our continuing mission to provide you with exceptional heart care, we have created designated Provider Care Teams.  These Care Teams include your primary Cardiologist (physician) and Advanced Practice Providers (APPs -  Physician Assistants and Nurse Practitioners) who all work together to provide you with the care you need, when you need it.  We recommend signing up for the patient portal called "MyChart".  Sign up information is provided on this After Visit Summary.  MyChart is used to connect with patients for Virtual Visits (Telemedicine).  Patients are able to view lab/test results, encounter notes, upcoming appointments, etc.  Non-urgent messages can be sent to your provider as well.   To learn more about what you can do with MyChart, go to [email or web address].    Your next appointment:    as needed  The format for your next appointment:   Either In Person or Virtual  Provider:   You may see Glenetta Hew, MD or one of the following Advanced Practice Providers on your designated Care Team:    Rosaria Ferries, PA-C  Jory Sims, DNP, ANP  Cadence Kathlen Mody, PA-C

## 2019-11-08 NOTE — Assessment & Plan Note (Signed)
With left main disease, with a target less than 70, however now results of his coronary CTA, really target would be less than 100.  Currently he actually is less than 70 on modest dose of statin.  Would simply continue statin.

## 2019-11-08 NOTE — Assessment & Plan Note (Signed)
Coronary calcium was seen only on the left main, but with less than 25% stenosis/minimal, but only recommendation will be to continue his factor modification.  No significant disease noted.  Very good result.  Plan: He is already on aspirin and statin as well as beta-blocker and amlodipine. With a statin dose to be added last year, his lipids are now well at goal with LDL less than 70.  Blood pressure is well controlled having added amlodipine and metoprolol.  He is currently being managed well by his PCP.  We discussed following up with cardiology versus just with his PCP and he is comfortable with his PCP.

## 2019-12-06 ENCOUNTER — Other Ambulatory Visit: Payer: Self-pay | Admitting: Internal Medicine

## 2020-01-27 ENCOUNTER — Telehealth: Payer: Self-pay

## 2020-01-27 NOTE — Telephone Encounter (Signed)
Nurse Assessment Nurse: Vallery Sa, RN, Cathy Date/Time (Eastern Time): 01/27/2020 11:21:06 AM Confirm and document reason for call. If symptomatic, describe symptoms. ---Richard Gates states that he has been fainting on and off for the past 12 years and fainted again about 3 days ago. No injury to his head. His wife thinks he a seizure yesterday. No severe breathing difficulty, but feels like his COPD is worse lately. No chest pain. Alert and responsive. Does the patient have any new or worsening symptoms? ---Yes Will a triage be completed? ---Yes Related visit to physician within the last 2 weeks? ---No Does the PT have any chronic conditions? (i.e. diabetes, asthma, this includes High risk factors for pregnancy, etc.) ---Yes List chronic conditions. ---COPD, High Blood Pressure Is this a behavioral health or substance abuse call? ---No Guidelines Guideline Title Affirmed Question Affirmed Notes Nurse Date/Time (Eastern Time) Seizure [1] Seizure lasting < 5 minutes AND [2] history of prior seizure(s) AND [3] taking anticonvulsants Vallery Sa, RN, Cathy 01/27/2020 11:24:58 AM Disp. Time Eilene Ghazi Time) Disposition Final User 01/27/2020 11:19:24 AM Send to Urgent Queue Jamal Maes 01/27/2020 11:27:37 AM Go to ED Now Yes Vallery Sa, RN, T[place] Disposition Overriden: Home Care Override Reason: Patients symptoms have resolved during call PLEASE NOTE: All timestamps contained within this report are represented as [place] Standard Time. CONFIDENTIALTY NOTICE: This fax transmission is intended only for the addressee. It contains information that is legally privileged, confidential or otherwise protected from use or disclosure. If you are not the intended recipient, you are strictly prohibited from reviewing, disclosing, copying using or disseminating any of this information or taking any action in reliance on or regarding this information. If you have received this fax in error, please notify us  immediately by telephone so that we can arrange for its return to [place]. Phone: [phone], Toll-Free: [phone], Fax: [phone] Page: 2 of 2 Call Id: [id number] Care Advice Given Per Guideline GO TO ED NOW: * You need to be seen in the Emergency Department. * Go to the ED at ___________ [place] now. Drive carefully. CARE ADVICE given per Seizure (Adult) guideline. CALL EMS 911: * Call EMS 911 if another seizure occurs. Comments User: Berton Mount, RN Date/Time Eilene Ghazi Time): 01/27/2020 11:29:29 AM [name] will have a family member take him to the ER now. He will call 911 to help as needed.  Appt scheduled w/ Percell Miller.

## 2020-01-28 ENCOUNTER — Other Ambulatory Visit: Payer: Self-pay

## 2020-01-28 ENCOUNTER — Telehealth: Payer: Self-pay | Admitting: Medical

## 2020-01-28 ENCOUNTER — Ambulatory Visit (INDEPENDENT_AMBULATORY_CARE_PROVIDER_SITE_OTHER): Payer: Self-pay | Admitting: Medical

## 2020-01-28 ENCOUNTER — Encounter: Payer: Self-pay | Admitting: Medical

## 2020-01-28 VITALS — BP 127/70 | HR 85 | Resp 18 | Ht 68.0 in | Wt 176.0 lb

## 2020-01-28 DIAGNOSIS — I10 Essential (primary) hypertension: Secondary | ICD-10-CM

## 2020-01-28 DIAGNOSIS — J449 Chronic obstructive pulmonary disease, unspecified: Secondary | ICD-10-CM

## 2020-01-28 DIAGNOSIS — R55 Syncope and collapse: Secondary | ICD-10-CM

## 2020-01-28 MED ORDER — FLUTICASONE-SALMETEROL 100-50 MCG/DOSE IN AEPB: 1.0000 | INHALATION_SPRAY | Freq: Two times a day (BID) | RESPIRATORY_TRACT | 6 refills | Status: DC

## 2020-01-28 MED ORDER — ALBUTEROL SULFATE HFA 108 (90 BASE) MCG/ACT IN AERS: 2.0000 | INHALATION_SPRAY | Freq: Four times a day (QID) | RESPIRATORY_TRACT | 5 refills | Status: DC | PRN

## 2020-01-28 NOTE — Progress Notes (Signed)
Subjective:    Patient ID: Richard Gates, male    DOB: 11-26-1960, 59 y.o.   MRN: [id number]  HPI  Pt states Friday afternoon he was sitting on the couch. He states he passed out sitting in chair.  States just happened after he got off of work. Pt drives a fork lift. Friday when he passed out he did have urinary incontinence.  Then on Sunday. He was seated appeared to be shaking. Last about one minute. No ha afterwards.  No hx of head trauma.   Pt states in 2008 in [place] sitting in back yard. He remember randomly passed about and landed on concrete.  Then he also states about one year ago. He was standing at door of his house and passed out.  No preceding chest pain or palpitations.  Pt has hx of copd. Some daily dyspnea. Pt quite smoking about 2.5 years ago.  Pt has new insurance that will take effect on October.   Review of Systems  Constitutional: Negative for chills, fatigue and fever.  Respiratory: Negative for cough, chest tightness, shortness of breath and wheezing.   Cardiovascular: Negative for chest pain and palpitations.  Gastrointestinal: Negative for abdominal pain.  Musculoskeletal: Negative for back pain and neck pain.  Skin: Negative for rash.  Neurological: Negative for dizziness, syncope, speech difficulty, weakness, numbness and headaches.  Hematological: Negative for adenopathy. Does not bruise/bleed easily.  Psychiatric/Behavioral: Negative for behavioral problems and confusion.     Past Medical History:  Diagnosis Date   COPD (chronic obstructive pulmonary disease) ([place])    Coronary artery calcification seen on CAT scan 08/2019   Coronary calcium score 103; short LM with<25% mixed calcific plaque (minimal); aortic atherosclerosis with normal size.  No dissection.  No aortic valve calcification   GERD (gastroesophageal reflux disease)    Hyperlipidemia    Hypertension      Social History   Socioeconomic History   Marital status:  Married    Spouse name: Not on file   Number of children: 1   Years of education: Not on file   Highest education level: Not on file  Occupational History   Occupation: [employer], order processing   Tobacco Use   Smoking status: Former Smoker    Quit date: 07/31/2016    Years since quitting: 3.4   Smokeless tobacco: Never Used   Tobacco comment: off tobacco as off 04/2018; h/o 1 pack a day from age 30 to 65,  Vaping Use   Vaping Use: Never used  Substance and Sexual Activity   Alcohol use: Yes    Alcohol/week: 1.0 standard drink    Types: 1 Standard drinks or equivalent per week    Comment: weekends-occ   Drug use: No   Sexual activity: Not on file  Other Topics Concern   Not on file  Social History Narrative   Household- pt and wife   1 daughter    4 g-children   2 g-g child   Social Determinants of Health   Financial Resource Strain:    Difficulty of Paying Living Expenses: Not on file  Food Insecurity:    Worried About Charity fundraiser in the Last Year: Not on file   [employer] of Food in the Last Year: Not on file  Transportation Needs:    Lack of Transportation (Medical): Not on file   Lack of Transportation (Non-Medical): Not on file  Physical Activity:    Days of Exercise per Week: Not on  file   Minutes of Exercise per Session: Not on file  Stress:    Feeling of Stress : Not on file  Social Connections:    Frequency of Communication with Friends and Family: Not on file   Frequency of Social Gatherings with Friends and Family: Not on file   Attends Religious Services: Not on file   Active Member of Clubs or Organizations: Not on file   Attends Archivist Meetings: Not on file   Marital Status: Not on file  Intimate Partner Violence:    Fear of Current or Ex-Partner: Not on file   Emotionally Abused: Not on file   Physically Abused: Not on file   Sexually Abused: Not on file    Past Surgical History:  Procedure  Laterality Date   APPENDECTOMY     COLONOSCOPY  08/24/2016   POLYPECTOMY     TRANSTHORACIC ECHOCARDIOGRAM  04/2018   EF 65-70% (vigorous). No RWMA.  Gr 1 DD.  Normal valves.  (In setting of COPD exacerbation)    Family History  Problem Relation Age of Onset   Stroke Father        July 6378: complicated by hemmrhage -- lost eyesight, memory loss   Hypertension Other    Heart disease Paternal Uncle    Breast cancer Sister    Heart disease Paternal Aunt        surgery   Heart attack Maternal Uncle    Colon cancer Neg Hx    Prostate cancer Neg Hx    Colon polyps Neg Hx    Esophageal cancer Neg Hx    Stomach cancer Neg Hx    Rectal cancer Neg Hx     No Known Allergies  Current Outpatient Medications on File Prior to Visit  Medication Sig Dispense Refill   amLODipine (NORVASC) 5 MG tablet Take 1 tablet (5 mg total) by mouth daily. 90 tablet 1   aspirin EC 81 MG tablet Take 81 mg by mouth daily.     metoprolol tartrate (LOPRESSOR) 50 MG tablet Take 1 tablet (50 mg total) by mouth 2 (two) times daily. 180 tablet 1   pantoprazole (PROTONIX) 40 MG tablet Take 1 tablet (40 mg total) by mouth daily. 90 tablet 3   rosuvastatin (CRESTOR) 20 MG tablet Take 1 tablet (20 mg total) by mouth daily. 90 tablet 3   sildenafil (REVATIO) 20 MG tablet Take 3-4 tablets (60-80 mg total) by mouth at bedtime as needed. 30 tablet 3   Sodium Sulfate-Mag Sulfate-KCl (SUTAB) [phone] MG TABS Take 1 kit by mouth as directed. Lot [id number], exp. 2/23 (Patient not taking: Reported on 01/28/2020) 24 tablet 0   sulfamethoxazole-trimethoprim (BACTRIM DS) 800-160 MG tablet Take 1 tablet by mouth 2 (two) times daily. (Patient not taking: Reported on 01/28/2020) 20 tablet 0   No current facility-administered medications on file prior to visit.    BP 127/70    Pulse 85    Resp 18    Ht _0  (1.727 m)    Wt 176 lb (79.8 kg)    SpO2 94%    BMI 26.76 kg/m       Objective:   Physical  Exam  General Mental Status- Alert. General Appearance- Not in acute distress.   Skin General: Color- Normal Color. Moisture- Normal Moisture.  Neck Carotid Arteries- Normal color. Moisture- Normal Moisture. No carotid bruits. No JVD.  Chest and Lung Exam Auscultation: Breath Sounds:-Normal.  Cardiovascular Auscultation:Rythm- Regular. Murmurs & Other Heart Sounds:Auscultation of the  heart reveals- No Murmurs.  Abdomen Inspection:-Inspeection Normal. Palpation/Percussion:Note:No mass. Palpation and Percussion of the abdomen reveal- Non Tender, Non Distended + BS, no rebound or guarding.    Neurologic Cranial Nerve exam:- CN III-XII intact(No nystagmus), symmetric smile. Drift Test:- No drift. Romberg Exam:- Negative.  Heal to Toe Gait exam:-Normal. Finger to Nose:- Normal/Intact Strength:- 5/5 equal and symmetric strength both upper and lower extremities.      Assessment & Plan:  You have recent episodes of syncope with some associated features that sound like probable seizure.  Also remote history of 2 random passing out episodes over the years might be seizure as well.  Based on history given today I do think referral to neurologist is indicated for probable EEG.  I do not you to get CBC and CMP to see if possible lab abnormalities present as well.  Your EKG showed normal sinus rhythm.  I do not think cardiac cause for syncope episodes.  History of smoking concern for COPD.  I do want to get back on your inhalers.   Prescription sent to pharmacy.  Follow-up in 1 week or as needed.  Mackie Pai, PA-C

## 2020-01-28 NOTE — Patient Instructions (Addendum)
You have recent episodes of syncope with some associated features that sound like probable seizure.  Also remote history of 2 random passing out episodes over the years might be seizure as well.  Based on history given today I do think referral to neurologist is indicated for probable EEG.  I do not you to get CBC and CMP to see if possible lab abnormalities present as well.  Your EKG showed normal sinus rhythm.  I do not think cardiac cause for syncope episodes.  History of smoking concern for COPD.  I do want to get back on your inhalers.   Prescription sent to pharmacy.   Follow-up in 1 week or as needed.

## 2020-01-28 NOTE — Telephone Encounter (Signed)
Will you look at pt neurology referral. Can he be seen asap. Very suspcious for seizure.

## 2020-01-29 ENCOUNTER — Telehealth: Payer: Self-pay | Admitting: Medical

## 2020-01-29 DIAGNOSIS — R55 Syncope and collapse: Secondary | ICD-10-CM

## 2020-01-29 LAB — COMPREHENSIVE METABOLIC PANEL
AG Ratio: 1.4 (calc) (ref 1.0–2.5)
ALT: 21 U/L (ref 9–46)
AST: 20 U/L (ref 10–35)
Albumin: 4.3 g/dL (ref 3.6–5.1)
Alkaline phosphatase (APISO): 79 U/L (ref 35–144)
BUN: 12 mg/dL (ref 7–25)
CO2: 33 mmol/L — ABNORMAL HIGH (ref 20–32)
Calcium: 9.8 mg/dL (ref 8.6–10.3)
Chloride: 103 mmol/L (ref 98–110)
Creat: 0.94 mg/dL (ref 0.70–1.33)
Globulin: 3 g/dL (calc) (ref 1.9–3.7)
Glucose, Bld: 105 mg/dL — ABNORMAL HIGH (ref 65–99)
Potassium: 4.2 mmol/L (ref 3.5–5.3)
Sodium: 142 mmol/L (ref 135–146)
Total Bilirubin: 0.3 mg/dL (ref 0.2–1.2)
Total Protein: 7.3 g/dL (ref 6.1–8.1)

## 2020-01-29 LAB — CBC WITH DIFFERENTIAL/PLATELET
Absolute Monocytes: 883 cells/uL (ref 200–950)
Basophils Absolute: 67 cells/uL (ref 0–200)
Basophils Relative: 0.7 %
Eosinophils Absolute: 1498 cells/uL — ABNORMAL HIGH (ref 15–500)
Eosinophils Relative: 15.6 %
HCT: 41.9 % (ref 38.5–50.0)
Hemoglobin: 13.9 g/dL (ref 13.2–17.1)
Lymphs Abs: 2246 cells/uL (ref 850–3900)
MCH: 31.7 pg (ref 27.0–33.0)
MCHC: 33.2 g/dL (ref 32.0–36.0)
MCV: 95.7 fL (ref 80.0–100.0)
MPV: 9.8 fL (ref 7.5–12.5)
Monocytes Relative: 9.2 %
Neutro Abs: 4906 cells/uL (ref 1500–7800)
Neutrophils Relative %: 51.1 %
Platelets: 343 10*3/uL (ref 140–400)
RBC: 4.38 10*6/uL (ref 4.20–5.80)
RDW: 13.7 % (ref 11.0–15.0)
Total Lymphocyte: 23.4 %
WBC: 9.6 10*3/uL (ref 3.8–10.8)

## 2020-01-29 NOTE — Telephone Encounter (Signed)
Placed referral to neurologist again. I thought I designated as urgent on first referral. Put in again. Thanks.

## 2020-01-29 NOTE — Telephone Encounter (Addendum)
I called 4 location. Everyone has to review records before scheduling. I spoke with Clarise Cruz at [facility] she stated if it's sent over urgent after reviewing record they will see where he needs to be worked in at.   New referral placed. Please show to [facility] neuro.

## 2020-01-30 ENCOUNTER — Encounter: Payer: Self-pay | Admitting: Neurology

## 2020-02-07 ENCOUNTER — Ambulatory Visit: Payer: Self-pay | Admitting: Internal Medicine

## 2020-03-01 ENCOUNTER — Other Ambulatory Visit: Payer: Self-pay | Admitting: Internal Medicine

## 2020-03-06 ENCOUNTER — Ambulatory Visit: Payer: Self-pay | Admitting: Internal Medicine

## 2020-03-30 ENCOUNTER — Ambulatory Visit: Payer: Self-pay | Admitting: Neurology

## 2020-03-31 ENCOUNTER — Encounter: Payer: BC Managed Care – PPO | Admitting: Internal Medicine

## 2020-04-07 ENCOUNTER — Telehealth: Payer: Self-pay | Admitting: Internal Medicine

## 2020-04-07 MED ORDER — ALBUTEROL SULFATE HFA 108 (90 BASE) MCG/ACT IN AERS: 2.0000 | INHALATION_SPRAY | Freq: Four times a day (QID) | RESPIRATORY_TRACT | 5 refills | Status: DC | PRN

## 2020-04-07 NOTE — Telephone Encounter (Signed)
Rx sent 

## 2020-04-07 NOTE — Telephone Encounter (Signed)
Medication: albuterol (VENTOLIN HFA) 108 (90 Base) MCG/ACT inhaler [[id number]]    Has the patient contacted their pharmacy? No. (If no, request that the patient contact the pharmacy for the refill.) (If yes, when and what did the pharmacy advise?)  Preferred Pharmacy (with phone number or street name):  [facility] [place], [address]  [place], [place] [address]  Phone:  [phone] Fax:  [phone]  DEA #:  [id number]          Agent: Please be advised that RX refills may take up to 3 business days. We ask that you follow-up with your pharmacy.

## 2020-04-09 ENCOUNTER — Other Ambulatory Visit: Payer: Self-pay

## 2020-04-09 DIAGNOSIS — Z122 Encounter for screening for malignant neoplasm of respiratory organs: Secondary | ICD-10-CM

## 2020-04-21 ENCOUNTER — Other Ambulatory Visit: Payer: Self-pay | Admitting: *Deleted

## 2020-04-21 DIAGNOSIS — Z87891 Personal history of nicotine dependence: Secondary | ICD-10-CM

## 2020-04-28 ENCOUNTER — Telehealth: Payer: Self-pay | Admitting: Acute Care

## 2020-04-29 ENCOUNTER — Telehealth: Payer: Self-pay | Admitting: Internal Medicine

## 2020-04-29 MED ORDER — ALBUTEROL SULFATE HFA 108 (90 BASE) MCG/ACT IN AERS: 2.0000 | INHALATION_SPRAY | Freq: Four times a day (QID) | RESPIRATORY_TRACT | 5 refills | Status: DC | PRN

## 2020-04-29 NOTE — Telephone Encounter (Signed)
Patient states he lost inhaler and need a refill   Please advise   Medication: albuterol (VENTOLIN HFA) 108 (90 Base) MCG/ACT inhaler [[id number]]   Has the patient contacted their pharmacy? No. (If no, request that the patient contact the pharmacy for the refill.) (If yes, when and what did the pharmacy advise?)  Preferred Pharmacy (with phone number or street name):  [id number] [place], [place] - [address] AT [facility] [address]  [address], [place] [address]  Phone:  [phone] Fax:  [phone]  DEA #:  [id number] Agent: Please be advised that RX refills may take up to 3 business days. We ask that you follow-up with your pharmacy.

## 2020-04-29 NOTE — Telephone Encounter (Signed)
Spoke with pt and verified that the health insurance we have listed for him is correct. Nothing further needed at this time.

## 2020-04-29 NOTE — Telephone Encounter (Signed)
Rx sent 

## 2020-04-30 IMAGING — CT CT CHEST LUNG CANCER SCREENING LOW DOSE W/O CM
2 of 5 series · 15 of 40 positions shown, 18 images · non-contrast
Comparison: 07/18/2016 chest radiograph.

CLINICAL DATA: 56-year-old asymptomatic male current smoker with 42
pack-year smoking history.

EXAM:
CT CHEST WITHOUT CONTRAST LOW-DOSE FOR LUNG CANCER SCREENING
TECHNIQUE: Multidetector CT imaging of the chest was performed following the
standard protocol without IV contrast.

[Series 3: lungs · axial · 0.78mm/px · z∈[-72,+226]mm · 12 of 328 slices shown, 15 images]
[im 15/328  mediastinal]
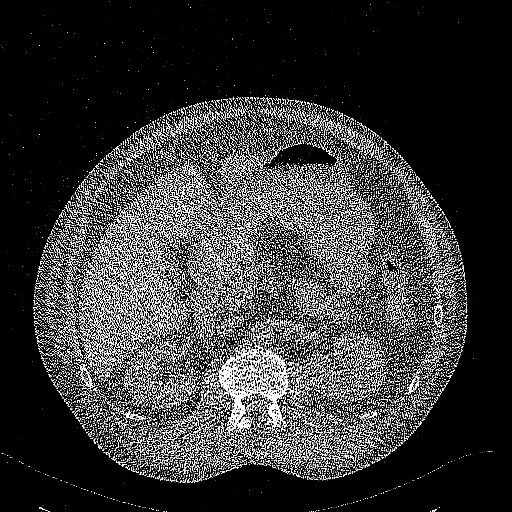
[im 15/328  lung]
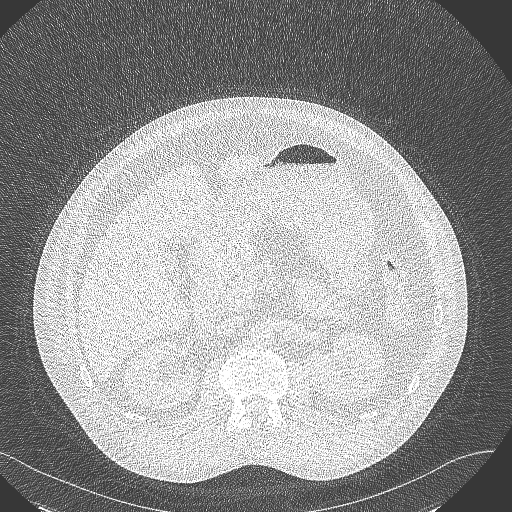
[im 45/328  lung]
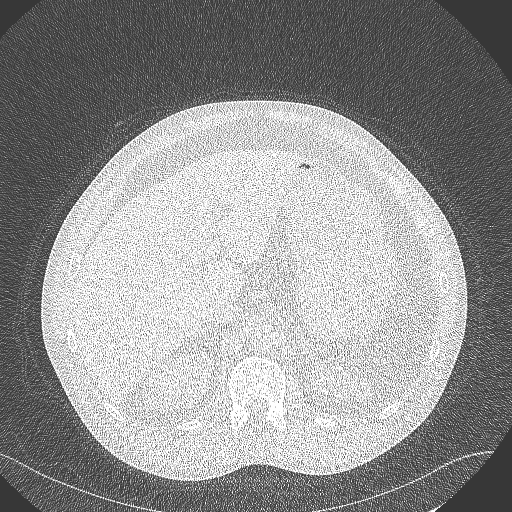
[im 75/328  lung]
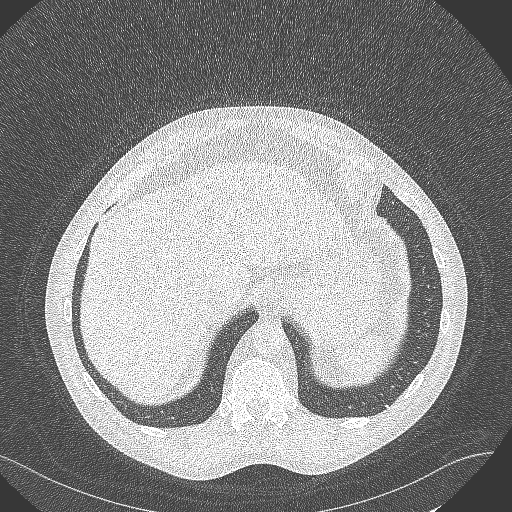
[im 105/328  lung]
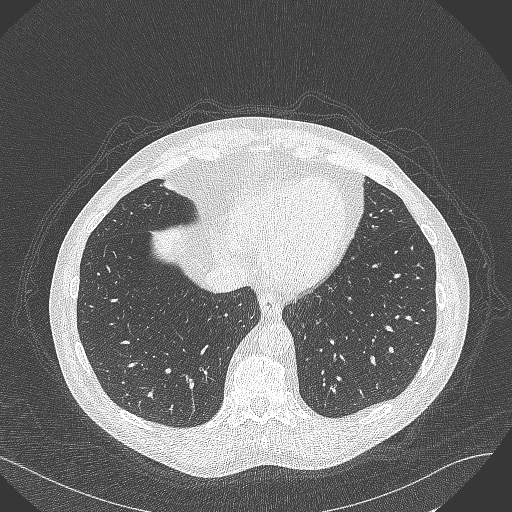
[im 119/328  mediastinal]
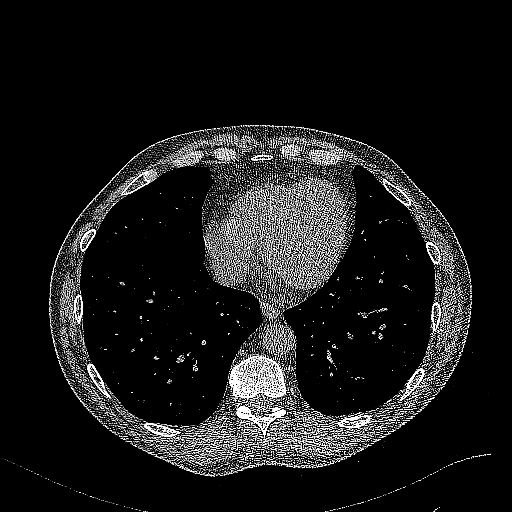
[im 119/328  lung]
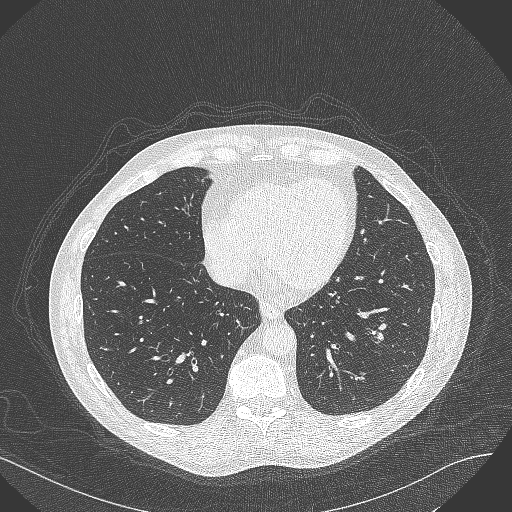
[im 149/328  lung]
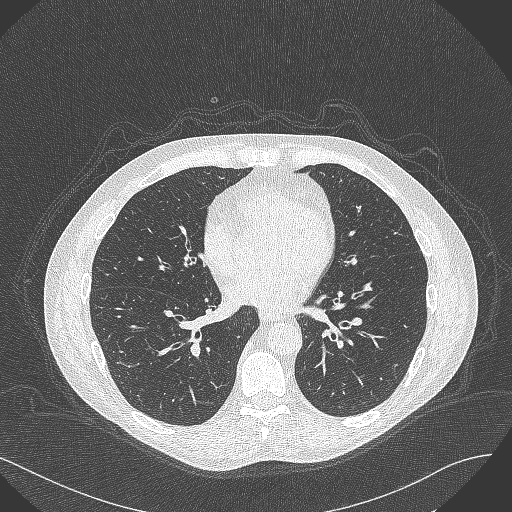
[im 179/328  lung]
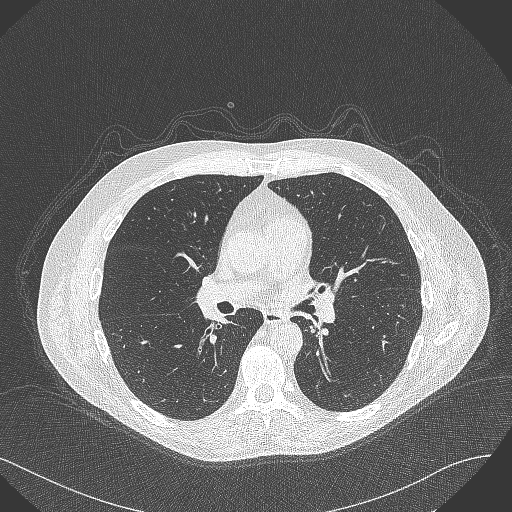
[im 209/328  lung]
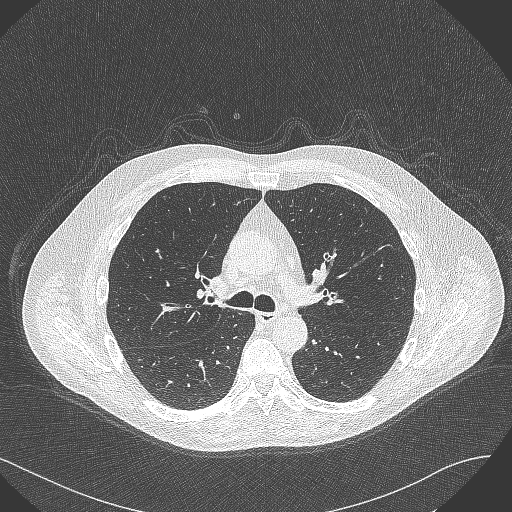
[im 223/328  mediastinal]
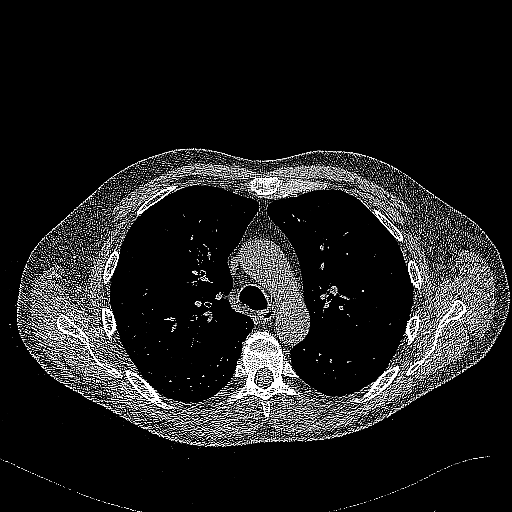
[im 223/328  lung]
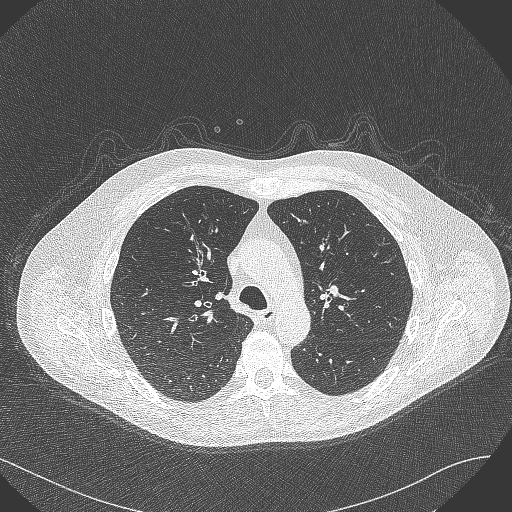
[im 253/328  lung]
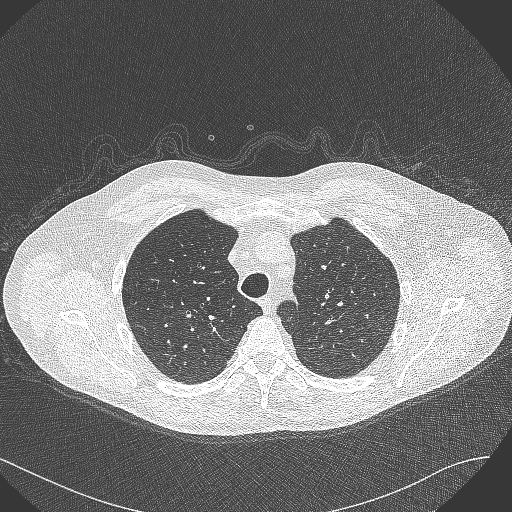
[im 283/328  lung]
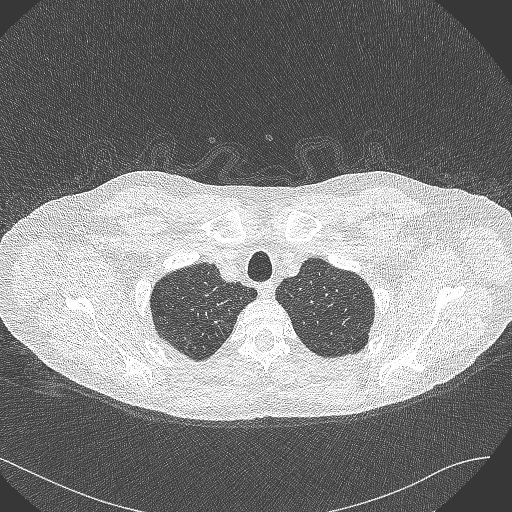
[im 313/328  lung]
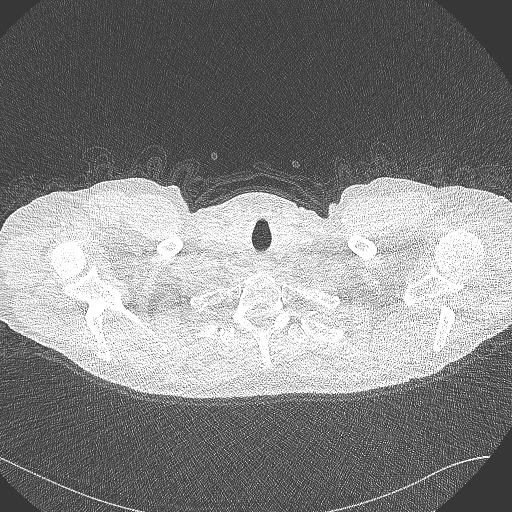

[Series 5: coronal · coronal · 0.71mm/px · 3 of 301 slices shown]
[im 61/301  lung]
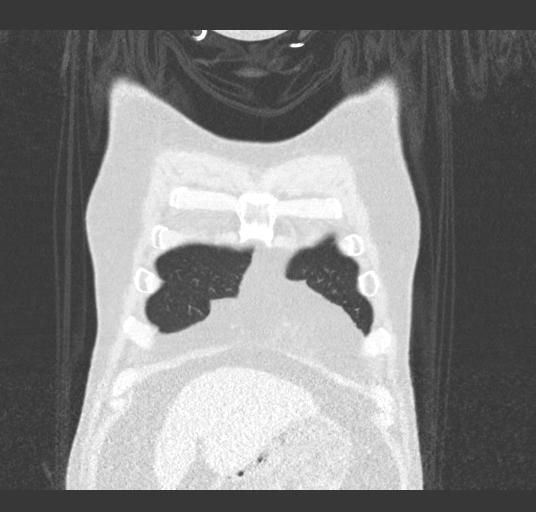
[im 121/301  lung]
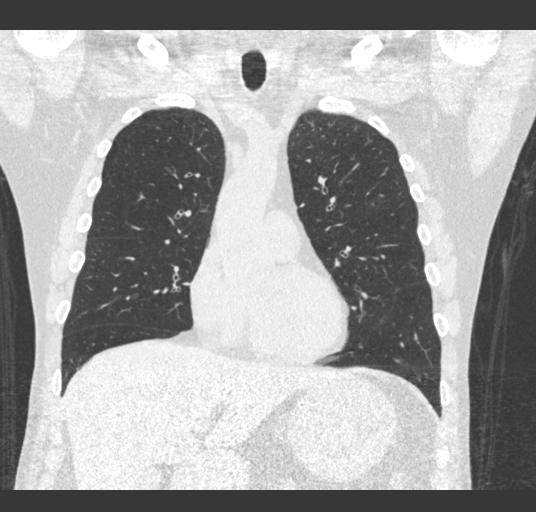
[im 181/301  lung]
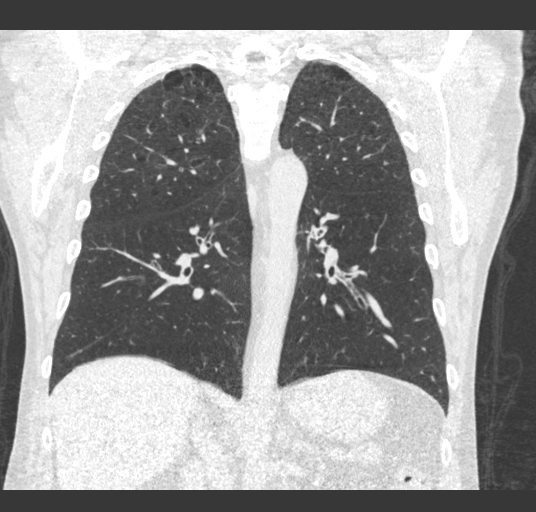

[15 of 40 positions shown; findings below may reference images not displayed]

FINDINGS: Cardiovascular: Normal heart size. No significant pericardial
effusion/thickening. Left main coronary atherosclerosis.
Atherosclerotic nonaneurysmal thoracic aorta. Normal caliber
pulmonary arteries.

Mediastinum/Nodes: No discrete thyroid nodules. Unremarkable
esophagus. No pathologically enlarged axillary, mediastinal or hilar
lymph nodes, noting limited sensitivity for the detection of hilar
adenopathy on this noncontrast study.

Lungs/Pleura: No pneumothorax. No pleural effusion. Mild
centrilobular and paraseptal emphysema with diffuse bronchial wall
thickening. No acute consolidative airspace disease or lung masses.
Tiny calcified dependent basilar left lower lobe granuloma. No
significant pulmonary nodules.

Upper abdomen: No acute abnormality.

Musculoskeletal: No aggressive appearing focal osseous lesions. Mild
thoracic spondylosis.
IMPRESSION: 1. Lung-RADS 1, negative. Continue annual screening with low-dose
chest CT without contrast in 12 months.
2. Left main coronary atherosclerosis.

Aortic Atherosclerosis (9T0V1-TMA.A) and Emphysema (9T0V1-SIZ.Q).

## 2020-05-04 NOTE — Progress Notes (Deleted)
 NEUROLOGY CONSULTATION NOTE  Richard Gates MRN: [id number] DOB: 02/27/1961  Referring provider: Edward Saguire, PA-C Primary care provider: Jose Paz, MD  Reason for consult:  syncope   Subjective:  Richard Gates is a 60 year old  ***-handed male with COPD, HTN and HLD who presents for syncope.  History supplemented by referring provider's note.  In September, he was sitting on the couch when he ***.  He has two prior similar episodes of syncope, back in 2008 and again in 2020.  ***     PAST MEDICAL HISTORY: Past Medical History:  Diagnosis Date  . COPD (chronic obstructive pulmonary disease) (HCC)   . Coronary artery calcification seen on CAT scan 08/2019   Coronary calcium score 103; short LM with<25% mixed calcific plaque (minimal); aortic atherosclerosis with normal size.  No dissection.  No aortic valve calcification  . GERD (gastroesophageal reflux disease)   . Hyperlipidemia   . Hypertension     PAST SURGICAL HISTORY: Past Surgical History:  Procedure Laterality Date  . APPENDECTOMY    . COLONOSCOPY  08/24/2016  . POLYPECTOMY    . TRANSTHORACIC ECHOCARDIOGRAM  04/2018   EF 65-70% (vigorous). No RWMA.  Gr 1 DD.  Normal valves.  (In setting of COPD exacerbation)    MEDICATIONS: Current Outpatient Medications on File Prior to Visit  Medication Sig Dispense Refill  . albuterol (VENTOLIN HFA) 108 (90 Base) MCG/ACT inhaler Inhale 2 puffs into the lungs every 6 (six) hours as needed for wheezing or shortness of breath. 18 g 5  . amLODipine (NORVASC) 5 MG tablet Take 1 tablet (5 mg total) by mouth daily. 90 tablet 1  . aspirin EC 81 MG tablet Take 81 mg by mouth daily.    . Fluticasone-Salmeterol (WIXELA INHUB) 100-50 MCG/DOSE AEPB Inhale 1 puff into the lungs 2 (two) times daily. 60 each 6  . metoprolol tartrate (LOPRESSOR) 50 MG tablet Take 1 tablet (50 mg total) by mouth 2 (two) times daily. 180 tablet 0  . pantoprazole (PROTONIX) 40 MG tablet Take 1 tablet  (40 mg total) by mouth daily. 90 tablet 3  . rosuvastatin (CRESTOR) 20 MG tablet Take 1 tablet (20 mg total) by mouth daily. 90 tablet 3  . sildenafil (REVATIO) 20 MG tablet Take 3-4 tablets (60-80 mg total) by mouth at bedtime as needed. 30 tablet 3  . Sodium Sulfate-Mag Sulfate-KCl (SUTAB) [phone] MG TABS Take 1 kit by mouth as directed. Lot [id number], exp. 2/23 (Patient not taking: Reported on 01/28/2020) 24 tablet 0  . sulfamethoxazole-trimethoprim (BACTRIM DS) 800-160 MG tablet Take 1 tablet by mouth 2 (two) times daily. (Patient not taking: Reported on 01/28/2020) 20 tablet 0   No current facility-administered medications on file prior to visit.    ALLERGIES: No Known Allergies  FAMILY HISTORY: Family History  Problem Relation Age of Onset  . Stroke Father        July 2019: complicated by hemmrhage -- lost eyesight, memory loss  . Hypertension Other   . Heart disease Paternal Uncle   . Breast cancer Sister   . Heart disease Paternal Aunt        surgery  . Heart attack Maternal Uncle   . Colon cancer Neg Hx   . Prostate cancer Neg Hx   . Colon polyps Neg Hx   . Esophageal cancer Neg Hx   . Stomach cancer Neg Hx   . Rectal cancer Neg Hx    ***.  SOCIAL HISTORY: Social History     Socioeconomic History  . Marital status: Married    Spouse name: Not on file  . Number of children: 1  . Years of education: Not on file  . Highest education level: Not on file  Occupational History  . Occupation: [employer], order processing   Tobacco Use  . Smoking status: Former Smoker    Quit date: 07/31/2016    Years since quitting: 3.7  . Smokeless tobacco: Never Used  . Tobacco comment: off tobacco as off 04/2018; h/o 1 pack a day from age 14 to 55,  Vaping Use  . Vaping Use: Never used  Substance and Sexual Activity  . Alcohol use: Yes    Alcohol/week: 1.0 standard drink    Types: 1 Standard drinks or equivalent per week    Comment: weekends-occ  . Drug use: No  . Sexual  activity: Not on file  Other Topics Concern  . Not on file  Social History Narrative   Household- pt and wife   1 daughter    4 g-children   2 g-g child   Social Determinants of Health   Financial Resource Strain: Not on file  Food Insecurity: Not on file  Transportation Needs: Not on file  Physical Activity: Not on file  Stress: Not on file  Social Connections: Not on file  Intimate Partner Violence: Not on file    Objective:  *** General: No acute distress.  Patient appears well-groomed.   Head:  Normocephalic/atraumatic Eyes:  fundi examined but not visualized Neck: supple, no paraspinal tenderness, full range of motion Back: No paraspinal tenderness Heart: regular rate and rhythm Lungs: Clear to auscultation bilaterally. Vascular: No carotid bruits. Neurological Exam: Mental status: alert and oriented to person, place, and time, recent and remote memory intact, fund of knowledge intact, attention and concentration intact, speech fluent and not dysarthric, language intact. Cranial nerves: CN I: not tested CN II: pupils equal, round and reactive to light, visual fields intact CN III, IV, VI:  full range of motion, no nystagmus, no ptosis CN V: facial sensation intact. CN VII: upper and lower face symmetric CN VIII: hearing intact CN IX, X: gag intact, uvula midline CN XI: sternocleidomastoid and trapezius muscles intact CN XII: tongue midline Bulk & Tone: normal, no fasciculations. Motor:  muscle strength 5/5 throughout Sensation:  Pinprick, temperature and vibratory sensation intact. Deep Tendon Reflexes:  2+ throughout,  toes downgoing.   Finger to nose testing:  Without dysmetria.   Heel to shin:  Without dysmetria.   Gait:  Normal station and stride.  Romberg negative.  Assessment/Plan:   ***    Thank you for allowing me to take part in the care of this patient.  Adam Jaffe, DO  CC:  Jose Paz, MD  Edward Saguire, PA-C     

## 2020-05-06 ENCOUNTER — Ambulatory Visit: Payer: Self-pay | Admitting: Neurology

## 2020-05-11 ENCOUNTER — Other Ambulatory Visit (HOSPITAL_BASED_OUTPATIENT_CLINIC_OR_DEPARTMENT_OTHER): Payer: Self-pay | Admitting: Internal Medicine

## 2020-05-11 ENCOUNTER — Other Ambulatory Visit: Payer: Self-pay

## 2020-05-11 ENCOUNTER — Encounter: Payer: Self-pay | Admitting: Internal Medicine

## 2020-05-11 ENCOUNTER — Ambulatory Visit (INDEPENDENT_AMBULATORY_CARE_PROVIDER_SITE_OTHER): Payer: 59 | Admitting: Internal Medicine

## 2020-05-11 ENCOUNTER — Ambulatory Visit: Payer: 59

## 2020-05-11 ENCOUNTER — Ambulatory Visit
Admission: RE | Admit: 2020-05-11 | Discharge: 2020-05-11 | Disposition: A | Discharge status: Home / Self Care | Payer: 59 | Source: Ambulatory Visit | Attending: Internal Medicine | Admitting: Internal Medicine

## 2020-05-11 VITALS — BP 159/92 | HR 92 | Temp 98.8°F | Resp 18 | Ht 68.0 in | Wt 175.0 lb

## 2020-05-11 DIAGNOSIS — J449 Chronic obstructive pulmonary disease, unspecified: Secondary | ICD-10-CM

## 2020-05-11 DIAGNOSIS — R739 Hyperglycemia, unspecified: Secondary | ICD-10-CM | POA: Diagnosis not present

## 2020-05-11 DIAGNOSIS — Z23 Encounter for immunization: Secondary | ICD-10-CM

## 2020-05-11 DIAGNOSIS — E785 Hyperlipidemia, unspecified: Secondary | ICD-10-CM | POA: Diagnosis not present

## 2020-05-11 DIAGNOSIS — R0902 Hypoxemia: Secondary | ICD-10-CM | POA: Insufficient documentation

## 2020-05-11 DIAGNOSIS — I10 Essential (primary) hypertension: Secondary | ICD-10-CM | POA: Diagnosis not present

## 2020-05-11 DIAGNOSIS — Z0000 Encounter for general adult medical examination without abnormal findings: Secondary | ICD-10-CM | POA: Diagnosis not present

## 2020-05-11 DIAGNOSIS — Z0001 Encounter for general adult medical examination with abnormal findings: Secondary | ICD-10-CM

## 2020-05-11 DIAGNOSIS — R0789 Other chest pain: Secondary | ICD-10-CM

## 2020-05-11 MED ORDER — PANTOPRAZOLE SODIUM 40 MG PO TBEC: 40.0000 mg | DELAYED_RELEASE_TABLET | Freq: Every day | ORAL | 3 refills | Status: DC

## 2020-05-11 MED ORDER — AMLODIPINE BESYLATE 5 MG PO TABS: 5.0000 mg | ORAL_TABLET | Freq: Every day | ORAL | 1 refills | Status: DC

## 2020-05-11 MED ORDER — FLUTICASONE-SALMETEROL 100-50 MCG/DOSE IN AEPB: 1.0000 | INHALATION_SPRAY | Freq: Two times a day (BID) | RESPIRATORY_TRACT | 6 refills | Status: DC

## 2020-05-11 MED ORDER — ROSUVASTATIN CALCIUM 20 MG PO TABS: 20.0000 mg | ORAL_TABLET | Freq: Every day | ORAL | 1 refills | Status: DC

## 2020-05-11 MED ORDER — METOPROLOL TARTRATE 50 MG PO TABS: 50.0000 mg | ORAL_TABLET | Freq: Two times a day (BID) | ORAL | 1 refills | Status: DC

## 2020-05-11 NOTE — Progress Notes (Signed)
   Covid-19 Vaccination Clinic  Name:  Richard Gates    MRN: [id number] DOB: Dec 04, 1960  05/11/2020  [name] was observed post Covid-19 immunization for 15 minutes without incident. He was provided with Vaccine Information Sheet and instruction to access the V-Safe system.   [name] was instructed to call 911 with any severe reactions post vaccine: Marland Kitchen Difficulty breathing  . Swelling of face and throat  . A fast heartbeat  . A bad rash all over body  . Dizziness and weakness   Immunizations Administered    Name Date Dose VIS Date Route   Pfizer COVID-19 Vaccine 05/11/2020  1:52 PM 0.3 mL 02/19/2020 Intramuscular   Manufacturer: [place]   Lot: [id number]   [place]: [address]-0

## 2020-05-11 NOTE — Progress Notes (Signed)
Pre visit review using our clinic review tool, if applicable. No additional management support is needed unless otherwise documented below in the visit note. 

## 2020-05-11 NOTE — Patient Instructions (Addendum)
Go back on your regular medicines including Wixela  Continue albuterol as needed  Check the  blood pressure 2 or 3 times a week BP GOAL is between 110/65 and  135/85. If it is consistently higher or lower, let me know    GO TO THE LAB : Get the blood work     [place], [place] Come back for checkup in 3 weeks   STOP BY THE FIRST FLOOR:  get the XR   You should be able to get a booster at any local pharmacy or at our pharmacy in the first floor

## 2020-05-11 NOTE — Progress Notes (Signed)
Subjective:    Patient ID: Richard Gates, male    DOB: April 12, 1961, 60 y.o.   MRN: [id number]  DOS:  05/11/2020 Type of visit - description: Here for CPX  He is out of his medications except for albuterol for the last few months due to lack of insurance.  Denies shortness of breath with normal ADLs but if he takes a walk or walk up stairs he gets short of breath. Again he is not taking anything but albuterol. Denies hemoptysis but he does have some cough worse in the morning w/ some yellow sputum.  Did report lateral chest pain, right side, for the last 2 months, worse with deep breaths or when he moves his torso. No rash, no weight loss, no postprandial pains, no hematuria, no blood in the stools.  No recent fever chills or URI type of symptoms   Review of Systems  Other than above, a 14 point review of systems is negative     Past Medical History:  Diagnosis Date   COPD (chronic obstructive pulmonary disease) (HCC)    Coronary artery calcification seen on CAT scan 08/2019   Coronary calcium score 103; short LM with<25% mixed calcific plaque (minimal); aortic atherosclerosis with normal size.  No dissection.  No aortic valve calcification   GERD (gastroesophageal reflux disease)    Hyperlipidemia    Hypertension     Past Surgical History:  Procedure Laterality Date   APPENDECTOMY     COLONOSCOPY  08/24/2016   POLYPECTOMY     TRANSTHORACIC ECHOCARDIOGRAM  04/2018   EF 65-70% (vigorous). No RWMA.  Gr 1 DD.  Normal valves.  (In setting of COPD exacerbation)   Social History   Socioeconomic History   Marital status: Married    Spouse name: Not on file   Number of children: 1   Years of education: Not on file   Highest education level: Not on file  Occupational History   Occupation: [employer]  Tobacco Use   Smoking status: Former Smoker    Quit date: 07/31/2016    Years since quitting: 3.7   Smokeless tobacco: Never Used  Vaping Use   Vaping Use:  Never used  Substance and Sexual Activity   Alcohol use: Yes    Alcohol/week: 1.0 standard drink    Types: 1 Standard drinks or equivalent per week    Comment: weekends-occ   Drug use: No   Sexual activity: Not on file  Other Topics Concern   Not on file  Social History Narrative   Household- pt and wife   1 daughter    4 g-children   2 g-g child      Allergies as of 05/11/2020   No Known Allergies     Medication List       Accurate as of May 11, 2020 11:59 PM. If you have any questions, ask your nurse or doctor.        STOP taking these medications   aspirin EC 81 MG tablet Stopped by: Kathlene November, MD   sulfamethoxazole-trimethoprim 800-160 MG tablet Commonly known as: BACTRIM DS Stopped by: Kathlene November, MD   Nila Nephew [phone] MG Tabs Generic drug: Sodium Sulfate-Mag Sulfate-KCl Stopped by: Kathlene November, MD     TAKE these medications   albuterol 108 (90 Base) MCG/ACT inhaler Commonly known as: VENTOLIN HFA Inhale 2 puffs into the lungs every 6 (six) hours as needed for wheezing or shortness of breath.   amLODipine 5 MG tablet Commonly known as: NORVASC  Take 1 tablet (5 mg total) by mouth daily.   Fluticasone-Salmeterol 100-50 MCG/DOSE Aepb Commonly known as: Wixela Inhub Inhale 1 puff into the lungs 2 (two) times daily.   metoprolol tartrate 50 MG tablet Commonly known as: LOPRESSOR Take 1 tablet (50 mg total) by mouth 2 (two) times daily.   pantoprazole 40 MG tablet Commonly known as: PROTONIX Take 1 tablet (40 mg total) by mouth daily.   rosuvastatin 20 MG tablet Commonly known as: CRESTOR Take 1 tablet (20 mg total) by mouth daily.   sildenafil 20 MG tablet Commonly known as: REVATIO Take 3-4 tablets (60-80 mg total) by mouth at bedtime as needed.          Objective:   Physical Exam Abdominal:      BP (!) 159/92 (BP Location: Left Arm, Patient Position: Sitting, Cuff Size: Normal)    Pulse 92    Temp 98.8 F (37.1 C) (Oral)    Resp  18    Ht 5\' 8"  (1.727 m)    Wt 175 lb (79.4 kg)    SpO2 94%    BMI 26.61 kg/m  General: Well developed, NAD, BMI noted Neck: No  thyromegaly  HEENT:  Normocephalic . Face symmetric, atraumatic Lungs:  Decreased breath sounds but clear Normal respiratory effort, no intercostal retractions, no accessory muscle use. Heart: RRR,  no murmur.  Abdomen:  Not distended, soft, see graphic Lower extremities: no pretibial edema bilaterally  Skin: Exposed areas without rash. Not pale. Not jaundice DRE: Normal sphincter tone, no stools, prostate normal. Neurologic:  alert & oriented X3.  Speech normal, gait appropriate for age and unassisted Strength symmetric and appropriate for age.  Psych: Cognition and judgment appear intact.  Cooperative with normal attention span and concentration.  Behavior appropriate. No anxious or depressed appearing.     Assessment     Assessment HTN GERD Hyperlipidemia Allergies , nasal  COPD, PFTs 2013 showed ratio of 58, FEV1 of 1.78- 52% without significant bronchodilator response, TLC was 77% with DLCO 78% Smoker  PLAN: Here for CPX HTN: BP slightly elevated today, has not taking amlodipine or metoprolol for months, Rx sent.  Reassess in 3 weeks. GERD: RF Protonix Hyperlipidemia: RF Crestor COPD: Currently only on albuterol, out of  Wixela for months due to lack of insurance.  After he walked from the front to the room O2 sat was 89%.  I rechecked it at rest: 94%. Plan: Refill Wixela and continue albuterol.  Reassess on RTC.  Currently with no acute respiratory symptoms. Former Smoker:  Tobacco free. Right-sided chest wall pain: As described above, abdomen is also slightly tender to palpation at the RUQ.  Sxs sound more pleuritic or mechanical.  We will get a chest x-ray, further eval if problem continue. PHQ-9 scored 15, but reports  is doing well emotionally Syncope: Was seen at this office by another provider a few months ago with a syncopal  spell, no further symptoms. RTC 3 months  This visit occurred during the SARS-CoV-2 public health emergency.  Safety protocols were in place, including screening questions prior to the visit, additional usage of staff PPE, and extensive cleaning of exam room while observing appropriate contact time as indicated for disinfecting solutions.

## 2020-05-12 ENCOUNTER — Encounter: Payer: Self-pay | Admitting: Internal Medicine

## 2020-05-12 LAB — CBC WITH DIFFERENTIAL/PLATELET
Basophils Absolute: 0.1 10*3/uL (ref 0.0–0.1)
Basophils Relative: 1.2 % (ref 0.0–3.0)
Eosinophils Absolute: 0.4 10*3/uL (ref 0.0–0.7)
Eosinophils Relative: 5.2 % — ABNORMAL HIGH (ref 0.0–5.0)
HCT: 40.8 % (ref 39.0–52.0)
Hemoglobin: 13.6 g/dL (ref 13.0–17.0)
Lymphocytes Relative: 18.7 % (ref 12.0–46.0)
Lymphs Abs: 1.3 10*3/uL (ref 0.7–4.0)
MCHC: 33.3 g/dL (ref 30.0–36.0)
MCV: 98.2 fl (ref 78.0–100.0)
Monocytes Absolute: 0.6 10*3/uL (ref 0.1–1.0)
Monocytes Relative: 9 % (ref 3.0–12.0)
Neutro Abs: 4.7 10*3/uL (ref 1.4–7.7)
Neutrophils Relative %: 65.9 % (ref 43.0–77.0)
Platelets: 381 10*3/uL (ref 150.0–400.0)
RBC: 4.16 Mil/uL — ABNORMAL LOW (ref 4.22–5.81)
RDW: 15.4 % (ref 11.5–15.5)
WBC: 7.1 10*3/uL (ref 4.0–10.5)

## 2020-05-12 LAB — COMPREHENSIVE METABOLIC PANEL
ALT: 12 U/L (ref 0–53)
AST: 15 U/L (ref 0–37)
Albumin: 4.1 g/dL (ref 3.5–5.2)
Alkaline Phosphatase: 81 U/L (ref 39–117)
BUN: 14 mg/dL (ref 6–23)
CO2: 30 mEq/L (ref 19–32)
Calcium: 9 mg/dL (ref 8.4–10.5)
Chloride: 101 mEq/L (ref 96–112)
Creatinine, Ser: 0.86 mg/dL (ref 0.40–1.50)
GFR: 94.39 mL/min (ref >60.00)
Glucose, Bld: 81 mg/dL (ref 70–99)
Potassium: 4.2 mEq/L (ref 3.5–5.1)
Sodium: 139 mEq/L (ref 135–145)
Total Bilirubin: 0.3 mg/dL (ref 0.2–1.2)
Total Protein: 6.7 g/dL (ref 6.0–8.3)

## 2020-05-12 LAB — PSA: PSA: 0.5 ng/mL (ref 0.10–4.00)

## 2020-05-12 LAB — HEMOGLOBIN A1C: Hgb A1c MFr Bld: 5.8 % (ref 4.6–6.5)

## 2020-05-12 MED FILL — PFIZER-BIONTECH COVID-19 VA: 30 | 21 days supply | Qty: 0 | Fill #0

## 2020-05-12 NOTE — Assessment & Plan Note (Signed)
Here for CPX HTN: BP slightly elevated today, has not taking amlodipine or metoprolol for months, Rx sent.  Reassess in 3 weeks. GERD: RF Protonix Hyperlipidemia: RF Crestor COPD: Currently only on albuterol, out of  Wixela for months due to lack of insurance.  After he walked from the front to the room O2 sat was 89%.  I rechecked it at rest: 94%. Plan: Refill Wixela and continue albuterol.  Reassess on RTC.  Currently with no acute respiratory symptoms. Former Smoker:  Tobacco free. Right-sided chest wall pain: As described above, abdomen is also slightly tender to palpation at the RUQ.  Sxs sound more pleuritic or mechanical.  We will get a chest x-ray, further eval if problem continue. PHQ-9 scored 15, but reports  is doing well emotionally Syncope: Was seen at this office by another provider a few months ago with a syncopal spell, no further symptoms. RTC 3 months:

## 2020-05-12 NOTE — Assessment & Plan Note (Signed)
-  Td 2013 -PNM 23 : 2020 -COVID vax x 2 , rec booster  -Flu shot: Today -Shingrix: We will discuss on RTC -CCS: cscope 07-2016, next perGI -Prostate ca screening: DRE normal, checking a PSA - Tobacco: Not smoking -Labs: CMP,CBC, A1c, PSA - Diet and exercise discussed

## 2020-05-14 ENCOUNTER — Other Ambulatory Visit: Payer: Self-pay

## 2020-05-14 ENCOUNTER — Telehealth: Payer: Self-pay | Admitting: Internal Medicine

## 2020-05-14 MED ORDER — BUDESONIDE-FORMOTEROL FUMARATE 80-4.5 MCG/ACT IN AERO: 2.0000 | INHALATION_SPRAY | Freq: Two times a day (BID) | RESPIRATORY_TRACT | 0 refills | Status: DC

## 2020-05-14 MED ORDER — MOMETASONE FURO-FORMOTEROL FUM 100-5 MCG/ACT IN AERO: 2.0000 | INHALATION_SPRAY | Freq: Two times a day (BID) | RESPIRATORY_TRACT | 0 refills | Status: DC

## 2020-05-14 MED ORDER — ALBUTEROL SULFATE HFA 108 (90 BASE) MCG/ACT IN AERS: 2.0000 | INHALATION_SPRAY | Freq: Four times a day (QID) | RESPIRATORY_TRACT | 5 refills | Status: DC | PRN

## 2020-05-14 NOTE — Telephone Encounter (Signed)
Advise patient:  I sent 2 prescriptions, Dulera and Symbicort.  He can take 1 or the other, what ever is less expensive.  Do not use both. Ask him to call and let us know which one he is doing.

## 2020-05-14 NOTE — Telephone Encounter (Signed)
LMOM informing of recommendations. Instructed to call if questions/concerns.  

## 2020-05-14 NOTE — Telephone Encounter (Signed)
Patient states that this inhaler cost to much, patient would like another inhaler sent in to pharmacy.  Fluticasone-Salmeterol (WIXELA INHUB) 100-50 MCG/DOSE AEPB [[id number]]    Patient is also requesting a refill :    Medication: albuterol (VENTOLIN HFA) 108 (90 Base) MCG/ACT inhaler [[id number]   Has the patient contacted their pharmacy? No. (If no, request that the patient contact the pharmacy for the refill.) (If yes, when and what did the pharmacy advise?)  Preferred Pharmacy (with phone number or street name) [facility] [place], [address]  [place], [place] [address]  Phone:  [phone] Fax:  [phone]  DEA #:  [id number]  [place] Reason: --     Agent: Please be advised that RX refills may take up to 3 business days. We ask that you follow-up with your pharmacy.

## 2020-05-14 NOTE — Telephone Encounter (Signed)
Refill sent for albuterol. Please advise regarding Wixela.

## 2020-06-01 ENCOUNTER — Ambulatory Visit (INDEPENDENT_AMBULATORY_CARE_PROVIDER_SITE_OTHER): Payer: 59 | Admitting: Internal Medicine

## 2020-06-01 ENCOUNTER — Other Ambulatory Visit: Payer: Self-pay

## 2020-06-01 ENCOUNTER — Encounter: Payer: Self-pay | Admitting: Internal Medicine

## 2020-06-01 ENCOUNTER — Encounter: Payer: Self-pay | Admitting: Acute Care

## 2020-06-01 ENCOUNTER — Ambulatory Visit (INDEPENDENT_AMBULATORY_CARE_PROVIDER_SITE_OTHER): Payer: 59 | Admitting: Acute Care

## 2020-06-01 ENCOUNTER — Ambulatory Visit (HOSPITAL_BASED_OUTPATIENT_CLINIC_OR_DEPARTMENT_OTHER)
Admission: RE | Admit: 2020-06-01 | Discharge: 2020-06-01 | Disposition: A | Discharge status: Home / Self Care | Payer: 59 | Source: Ambulatory Visit | Attending: Acute Care | Admitting: Acute Care

## 2020-06-01 VITALS — BP 162/90 | HR 112 | Temp 98.3°F | Resp 18 | Ht 68.0 in | Wt 172.0 lb

## 2020-06-01 DIAGNOSIS — I10 Essential (primary) hypertension: Secondary | ICD-10-CM | POA: Diagnosis not present

## 2020-06-01 DIAGNOSIS — E785 Hyperlipidemia, unspecified: Secondary | ICD-10-CM

## 2020-06-01 DIAGNOSIS — Z122 Encounter for screening for malignant neoplasm of respiratory organs: Secondary | ICD-10-CM

## 2020-06-01 DIAGNOSIS — Z87891 Personal history of nicotine dependence: Secondary | ICD-10-CM | POA: Diagnosis not present

## 2020-06-01 MED ORDER — AMLODIPINE BESYLATE 10 MG PO TABS: 10.0000 mg | ORAL_TABLET | Freq: Every day | ORAL | 1 refills | Status: DC

## 2020-06-01 NOTE — Progress Notes (Signed)
Subjective:    Patient ID: Richard Gates, male    DOB: 1960-06-11, 60 y.o.   MRN: [id number]  DOS:  06/01/2020 Type of visit - description: Follow-up HTN: Good med compliance, no apparent side effects, ambulatory BPs slightly elevated    Review of Systems Denies chest pain or difficulty breathing.  No edema  Past Medical History:  Diagnosis Date  . COPD (chronic obstructive pulmonary disease) ([place])   . Coronary artery calcification seen on CAT scan 08/2019   Coronary calcium score 103; short LM with<25% mixed calcific plaque (minimal); aortic atherosclerosis with normal size.  No dissection.  No aortic valve calcification  . GERD (gastroesophageal reflux disease)   . Hyperlipidemia   . Hypertension     Past Surgical History:  Procedure Laterality Date  . APPENDECTOMY    . COLONOSCOPY  08/24/2016  . POLYPECTOMY    . TRANSTHORACIC ECHOCARDIOGRAM  04/2018   EF 65-70% (vigorous). No RWMA.  Gr 1 DD.  Normal valves.  (In setting of COPD exacerbation)    Allergies as of 06/01/2020   No Known Allergies     Medication List       Accurate as of June 01, 2020  4:04 PM. If you have any questions, ask your nurse or doctor.        albuterol 108 (90 Base) MCG/ACT inhaler Commonly known as: VENTOLIN HFA Inhale 2 puffs into the lungs every 6 (six) hours as needed for wheezing or shortness of breath.   amLODipine 5 MG tablet Commonly known as: NORVASC Take 1 tablet (5 mg total) by mouth daily.   budesonide-formoterol 80-4.5 MCG/ACT inhaler Commonly known as: SYMBICORT Inhale 2 puffs into the lungs 2 (two) times daily.   metoprolol tartrate 50 MG tablet Commonly known as: LOPRESSOR Take 1 tablet (50 mg total) by mouth 2 (two) times daily.   mometasone-formoterol 100-5 MCG/ACT Aero Commonly known as: DULERA Inhale 2 puffs into the lungs in the morning and at bedtime.   pantoprazole 40 MG tablet Commonly known as: PROTONIX Take 1 tablet (40 mg total) by mouth  daily.   rosuvastatin 20 MG tablet Commonly known as: CRESTOR Take 1 tablet (20 mg total) by mouth daily.   sildenafil 20 MG tablet Commonly known as: REVATIO Take 3-4 tablets (60-80 mg total) by mouth at bedtime as needed.          Objective:   Physical Exam BP (!) 162/90 (BP Location: Left Arm, Patient Position: Sitting, Cuff Size: Small)   Pulse (!) 112   Temp 98.3 F (36.8 C) (Oral)   Resp 18   Ht 5\' 8"  (1.727 m)   Wt 172 lb (78 kg)   SpO2 96%   BMI 26.15 kg/m  General:   Well developed, NAD, BMI noted. HEENT:  Normocephalic . Face symmetric, atraumatic Lungs:  CTA B Normal respiratory effort, no intercostal retractions, no accessory muscle use. Heart: RRR,  no murmur.  Lower extremities: no pretibial edema bilaterally  Skin: Not pale. Not jaundice Neurologic:  alert & oriented X3.  Speech normal, gait appropriate for age and unassisted Psych--  Cognition and judgment appear intact.  Cooperative with normal attention span and concentration.  Behavior appropriate. No anxious or depressed appearing.      Assessment     Assessment HTN GERD Hyperlipidemia Allergies , nasal  COPD, PFTs 2013 showed ratio of 58, FEV1 of 1.78- 52% without significant bronchodilator response, TLC was 77% with DLCO 78% Smoker  PLAN: HTN: Since the last  visit, started amlodipine and metoprolol, BP today 162/90, heart rate elevated, ambulatory BPs also elevated in the 170 range. History of COPD, despite mild tachycardia I am concerned about increasing beta-blockers. Plan: Increase amlodipine to 10 mg, monitor BPs, see AVS. High cholesterol: On Crestor, labs. COPD: Currently unable to for inhalers or other than albuterol, once he has access to less expensive medications thanks to a new insurance, he plans to start either Symbicort or Dulera. RTC 3 months   This visit occurred during the SARS-CoV-2 public health emergency.  Safety protocols were in place, including screening  questions prior to the visit, additional usage of staff PPE, and extensive cleaning of exam room while observing appropriate contact time as indicated for disinfecting solutions.

## 2020-06-01 NOTE — Progress Notes (Signed)
Pre visit review using our clinic review tool, if applicable. No additional management support is needed unless otherwise documented below in the visit note. 

## 2020-06-01 NOTE — Patient Instructions (Signed)
Amlodipine: Increased from 5 mg  To 10 mg every day.  We will send a new prescription  Other medications the same  Check your blood pressure twice a week  Check the  blood pressure 2 or 3 times a month week monthly weekly daily BP GOAL is between 110/65 and  135/85.  Call with your blood pressure readings in 3 weeks  GO TO THE LAB : Get the blood work     [place], [place] Come back for a checkup in 3 months

## 2020-06-01 NOTE — Patient Instructions (Signed)
Thank you for participating in the [facility]. It was our pleasure to meet you today. We will call you with the results of your scan within the next few days. Your scan will be assigned a Lung RADS category score by the physicians reading the scans.  This Lung RADS score determines follow up scanning.  See below for description of categories, and follow up screening recommendations. We will be in touch to schedule your follow up screening annually or based on recommendations of our providers. We will fax a copy of your scan results to your Primary Care Physician, or the physician who referred you to the program, to ensure they have the results. Please call the office if you have any questions or concerns regarding your scanning experience or results.  Our office number is [phone]. Please speak with Denise Phelps, RN. She is our Lung Cancer Screening RN. If she is unavailable when you call, please have the office staff send her a message. She will return your call at her earliest convenience. Remember, if your scan is normal, we will scan you annually as long as you continue to meet the criteria for the program. (Age 55-77, Current smoker or smoker who has quit within the last 15 years). If you are a smoker, remember, quitting is the single most powerful action that you can take to decrease your risk of lung cancer and other pulmonary, breathing related problems. We know quitting is hard, and we are here to help.  Please let us know if there is anything we can do to help you meet your goal of quitting. If you are a former smoker, congratulations. We are proud of you! Remain smoke free! Remember you can refer friends or family members through the number above.  We will screen them to make sure they meet criteria for the program. Thank you for helping us take better care of you by participating in Lung Screening.  Lung RADS Categories:  Lung RADS 1: no nodules  or definitely non-concerning nodules.  Recommendation is for a repeat annual scan in 12 months.  Lung RADS 2:  nodules that are non-concerning in appearance and behavior with a very low likelihood of becoming an active cancer. Recommendation is for a repeat annual scan in 12 months.  Lung RADS 3: nodules that are probably non-concerning , includes nodules with a low likelihood of becoming an active cancer.  Recommendation is for a 6-month repeat screening scan. Often noted after an upper respiratory illness. We will be in touch to make sure you have no questions, and to schedule your 6-month scan.  Lung RADS 4 A: nodules with concerning findings, recommendation is most often for a follow up scan in 3 months or additional testing based on our provider's assessment of the scan. We will be in touch to make sure you have no questions and to schedule the recommended 3 month follow up scan.  Lung RADS 4 B:  indicates findings that are concerning. We will be in touch with you to schedule additional diagnostic testing based on our provider's  assessment of the scan.   

## 2020-06-01 NOTE — Progress Notes (Signed)
Virtual Visit via Telephone Note  I connected with Richard Gates on 06/01/20 at  4:30 PM EST by telephone and verified that I am speaking with the correct person using two identifiers.  Location: Patient: At [facility] Provider: [place], [place], [place], [address]   I discussed the limitations, risks, security and privacy concerns of performing an evaluation and management service by telephone and the availability of in person appointments. I also discussed with the patient that there may be a patient responsible charge related to this service. The patient expressed understanding and agreed to proceed.     Shared Decision Making Visit Lung Cancer Screening Program [phone])   Eligibility:  Age 60 y.o.  Pack Years Smoking History Calculation 41  pack year smoking history (# packs/per year x # years smoked)  Recent History of coughing up blood  no  Unexplained weight loss? no ( >Than 15 pounds within the last 6 months )  Prior History Lung / other cancer no (Diagnosis within the last 5 years already requiring surveillance chest CT Scans).  Smoking Status Former Smoker  Former Smokers: Years since quit: 1 year  Quit Date: 2020  Visit Components:  Discussion included one or more decision making aids. yes  Discussion included risk/benefits of screening. yes  Discussion included potential follow up diagnostic testing for abnormal scans. yes  Discussion included meaning and risk of over diagnosis. yes  Discussion included meaning and risk of False Positives. yes  Discussion included meaning of total radiation exposure. yes  Counseling Included:  Importance of adherence to annual lung cancer LDCT screening. yes  Impact of comorbidities on ability to participate in the program. yes  Ability and willingness to under diagnostic treatment. yes  Smoking Cessation Counseling:  Current Smokers:   Discussed importance of smoking cessation.  yes  Information about tobacco cessation classes and interventions provided to patient. yes  Patient provided with "ticket" for LDCT Scan. yes  Symptomatic Patient. no  Counseling  Diagnosis Code: Tobacco Use Z72.0  Asymptomatic Patient yes  Counseling (Intermediate counseling: > three minutes counseling) V4259  Former Smokers:   Discussed the importance of maintaining cigarette abstinence. yes  Diagnosis Code: Personal History of Nicotine Dependence. D63.875  Information about tobacco cessation classes and interventions provided to patient. Yes  Patient provided with "ticket" for LDCT Scan. yes  Written Order for Lung Cancer Screening with LDCT placed in Epic. Yes (CT Chest Lung Cancer Screening Low Dose W/O CM) [id number] Z12.2-Screening of respiratory organs Z87.891-Personal history of nicotine dependence  I spent 25 minutes of face to face time with Mr. Tangonan discussing the risks and benefits of lung cancer screening. We viewed a power point together that explained in detail the above noted topics. We took the time to pause the power point at intervals to allow for questions to be asked and answered to ensure understanding. We discussed that he had taken the single most powerful action possible to decrease his risk of developing lung cancer when he quit smoking. I counseled him to remain smoke free, and to contact me if he ever had the desire to smoke again so that I can provide resources and tools to help support the effort to remain smoke free. We discussed the time and location of the scan, and that either  Doroteo Glassman RN or I will call with the results within  24-48 hours of receiving them. He has my card and contact information in the event he needs to speak  with me, in addition to a copy of the power point we reviewed as a resource. He verbalized understanding of all of the above and had no further questions upon leaving the office.     I explained to the patient that there  has been a high incidence of coronary artery disease noted on these exams. I explained that this is a non-gated exam therefore degree or severity cannot be determined. This patient is currently on statin therapy. I have asked the patient to follow-up with their PCP regarding any incidental finding of coronary artery disease and management with diet or medication as they feel is clinically indicated. The patient verbalized understanding of the above and had no further questions.     Magdalen Spatz, NP 06/01/2020

## 2020-06-02 LAB — LIPID PANEL
Cholesterol: 230 mg/dL — ABNORMAL HIGH (ref 0–200)
HDL: 72.6 mg/dL (ref >39.00)
Total CHOL/HDL Ratio: 3
Triglycerides: 568 mg/dL — ABNORMAL HIGH (ref 0.0–149.0)

## 2020-06-02 LAB — LDL CHOLESTEROL, DIRECT: Direct LDL: 84 mg/dL

## 2020-06-02 NOTE — Assessment & Plan Note (Signed)
HTN: Since the last visit, started amlodipine and metoprolol, BP today 162/90, heart rate elevated, ambulatory BPs also elevated in the 170 range. History of COPD, despite mild tachycardia I am concerned about increasing beta-blockers. Plan: Increase amlodipine to 10 mg, monitor BPs, see AVS. High cholesterol: On Crestor, labs. COPD: Currently unable to for inhalers or other than albuterol, once he has access to less expensive medications thanks to a new insurance, he plans to start either Symbicort or Dulera. RTC 3 months

## 2020-06-11 NOTE — Progress Notes (Signed)
Please call patient and let them  know their  low dose Ct was read as a Lung RADS 1, negative study: no nodules or definitely benign nodules. Radiology recommendation is for a repeat LDCT in 12 months. .Please let them  know we will order and schedule their  annual screening scan for 06/2020. Please let them  know there was notation of CAD on their  scan.  Please remind the patient  that this is a non-gated exam therefore degree or severity of disease  cannot be determined. Please have them  follow up with their PCP regarding potential risk factor modification, dietary therapy or pharmacologic therapy if clinically indicated. Pt.  is  currently on statin therapy. Please place order for annual  screening scan for  06/2021 and fax results to PCP. Thanks so much.  There is notation of CAD.They are followed by cardiology. Thanks

## 2020-06-12 ENCOUNTER — Other Ambulatory Visit: Payer: Self-pay | Admitting: *Deleted

## 2020-06-12 DIAGNOSIS — Z87891 Personal history of nicotine dependence: Secondary | ICD-10-CM

## 2020-07-02 ENCOUNTER — Ambulatory Visit: Payer: Self-pay | Admitting: Neurology

## 2020-07-24 ENCOUNTER — Emergency Department (HOSPITAL_BASED_OUTPATIENT_CLINIC_OR_DEPARTMENT_OTHER)
Admission: EM | Admit: 2020-07-24 | Discharge: 2020-07-24 | Disposition: A | Discharge status: Home / Self Care | Payer: 59 | Attending: Emergency Medicine | Admitting: Emergency Medicine

## 2020-07-24 ENCOUNTER — Other Ambulatory Visit: Payer: Self-pay

## 2020-07-24 ENCOUNTER — Other Ambulatory Visit (HOSPITAL_BASED_OUTPATIENT_CLINIC_OR_DEPARTMENT_OTHER): Payer: Self-pay | Admitting: Emergency Medicine

## 2020-07-24 ENCOUNTER — Encounter (HOSPITAL_BASED_OUTPATIENT_CLINIC_OR_DEPARTMENT_OTHER): Payer: Self-pay

## 2020-07-24 ENCOUNTER — Emergency Department (HOSPITAL_BASED_OUTPATIENT_CLINIC_OR_DEPARTMENT_OTHER): Payer: 59

## 2020-07-24 DIAGNOSIS — X58XXXA Exposure to other specified factors, initial encounter: Secondary | ICD-10-CM | POA: Insufficient documentation

## 2020-07-24 DIAGNOSIS — Z79899 Other long term (current) drug therapy: Secondary | ICD-10-CM | POA: Insufficient documentation

## 2020-07-24 DIAGNOSIS — S2231XA Fracture of one rib, right side, initial encounter for closed fracture: Secondary | ICD-10-CM | POA: Diagnosis not present

## 2020-07-24 DIAGNOSIS — I10 Essential (primary) hypertension: Secondary | ICD-10-CM | POA: Diagnosis not present

## 2020-07-24 DIAGNOSIS — S299XXA Unspecified injury of thorax, initial encounter: Secondary | ICD-10-CM | POA: Diagnosis present

## 2020-07-24 DIAGNOSIS — J449 Chronic obstructive pulmonary disease, unspecified: Secondary | ICD-10-CM | POA: Insufficient documentation

## 2020-07-24 DIAGNOSIS — Z87891 Personal history of nicotine dependence: Secondary | ICD-10-CM | POA: Insufficient documentation

## 2020-07-24 DIAGNOSIS — R0789 Other chest pain: Secondary | ICD-10-CM

## 2020-07-24 MED ORDER — CYCLOBENZAPRINE HCL 10 MG PO TABS: 10.0000 mg | ORAL_TABLET | Freq: Two times a day (BID) | ORAL | 0 refills | Status: DC | PRN

## 2020-07-24 MED ORDER — LIDOCAINE 5 % EX PTCH: 1.0000 | MEDICATED_PATCH | CUTANEOUS | 0 refills | Status: DC

## 2020-07-24 MED ORDER — LIDOCAINE 5 % EX PTCH
1.0000 | MEDICATED_PATCH | CUTANEOUS | Status: DC
  Administered 2020-07-24: 1 via TRANSDERMAL
  Filled 2020-07-24: qty 1

## 2020-07-24 MED FILL — CYCLOBENZAPRINE HCL 10 MG T: 10 | 6 days supply | Qty: 12 | Fill #0

## 2020-07-24 MED FILL — LIDOCAINE 5 % PTCH: 5 | 30 days supply | Qty: 30 | Fill #0

## 2020-07-24 NOTE — ED Triage Notes (Signed)
Pt arrives with c/o right sided flank pain since Monday states that the pain got worse over the last 2 days with some spasms. Denies urinary symptoms.

## 2020-07-24 NOTE — ED Notes (Signed)
Pt requests to speak to provider to discuss pain control for discharge.

## 2020-07-24 NOTE — Discharge Instructions (Signed)
Your x-ray today shows an old rib fracture of the eighth rib.  Pain may be coming from this area.  You can apply the lidocaine patches to the area to help with pain.  Follow-up with your doctor.

## 2020-07-24 NOTE — ED Provider Notes (Signed)
[facility] EMERGENCY DEPARTMENT Provider Note   CSN: [id number] Arrival date & time: 07/24/20  1301     History Chief Complaint  Patient presents with  . Flank Pain    Richard Gates is a 60 y.o. male.  60 year old male with complaint of right midaxillary chest wall pain, onset yesterday without falls or injuries. Pain is worse with movement- twisting/reaching, also with taking a deep breath. Described as a spasming pain.  Reports similar pain a few months ago, seen by his PCP at that time who did an x-ray of his lungs which did not reveal anything and his pain resolved.        Past Medical History:  Diagnosis Date  . COPD (chronic obstructive pulmonary disease) ([place])   . Coronary artery calcification seen on CAT scan 08/2019   Coronary calcium score 103; short LM with<25% mixed calcific plaque (minimal); aortic atherosclerosis with normal size.  No dissection.  No aortic valve calcification  . GERD (gastroesophageal reflux disease)   . Hyperlipidemia   . Hypertension     Patient Active Problem List   Diagnosis Date Noted  . Erectile dysfunction 09/07/2018  . Hyperlipidemia with target LDL less than 70 08/17/2018  . Left main coronary artery disease 06/15/2018  . Coronary artery calcification seen on computed tomography 06/15/2018  . DOE (dyspnea on exertion) 06/15/2018  . Acute respiratory failure with hypoxia ([place]) 04/16/2018  . Hypertension   . Elevated troponin I level   . COPD mixed type ([place]) 07/19/2016  . PCP NOTES >>>>>>>>>>>>>>> 06/23/2016  . Tobacco abuse 02/07/2012  . Anxiety and depression 02/07/2012  . Tachycardia 01/03/2012  . Annual physical exam 05/12/2011    Past Surgical History:  Procedure Laterality Date  . APPENDECTOMY    . COLONOSCOPY  08/24/2016  . POLYPECTOMY    . TRANSTHORACIC ECHOCARDIOGRAM  04/2018   EF 65-70% (vigorous). No RWMA.  Gr 1 DD.  Normal valves.  (In setting of COPD exacerbation)       Family History   Problem Relation Age of Onset  . Stroke Father        July 5427: complicated by hemmrhage -- lost eyesight, memory loss  . Hypertension Other   . Heart disease Paternal Uncle   . Breast cancer Sister   . Heart disease Paternal Aunt        surgery  . Heart attack Maternal Uncle   . Colon cancer Neg Hx   . Prostate cancer Neg Hx   . Colon polyps Neg Hx   . Esophageal cancer Neg Hx   . Stomach cancer Neg Hx   . Rectal cancer Neg Hx     Social History   Tobacco Use  . Smoking status: Former Smoker    Packs/day: 0.75    Years: 41.00    Pack years: 30.75    Types: Cigarettes    Quit date: 2020    Years since quitting: 2.2  . Smokeless tobacco: Never Used  Vaping Use  . Vaping Use: Never used  Substance Use Topics  . Alcohol use: Yes    Alcohol/week: 1.0 standard drink    Types: 1 Standard drinks or equivalent per week    Comment: weekends-occ  . Drug use: No    Home Medications Prior to Admission medications   Medication Sig Start Date End Date Taking? Authorizing Provider  albuterol (VENTOLIN HFA) 108 (90 Base) MCG/ACT inhaler Inhale 2 puffs into the lungs every 6 (six) hours as needed  for wheezing or shortness of breath. 05/14/20  Yes Paz, Alda Berthold, MD  amLODipine (NORVASC) 10 MG tablet Take 1 tablet (10 mg total) by mouth daily. 06/01/20  Yes Paz, Alda Berthold, MD  cyclobenzaprine (FLEXERIL) 10 MG tablet Take 1 tablet (10 mg total) by mouth 2 (two) times daily as needed for muscle spasms. 07/24/20  Yes Tacy Learn, PA-C  lidocaine (LIDODERM) 5 % Place 1 patch onto the skin daily. Remove & Discard patch within 12 hours or as directed by MD 07/24/20  Yes Tacy Learn, PA-C  metoprolol tartrate (LOPRESSOR) 50 MG tablet Take 1 tablet (50 mg total) by mouth 2 (two) times daily. 05/11/20  Yes Paz, Alda Berthold, MD  rosuvastatin (CRESTOR) 20 MG tablet Take 1 tablet (20 mg total) by mouth daily. 05/11/20  Yes Paz, Alda Berthold, MD  budesonide-formoterol [facility]) 80-4.5 MCG/ACT inhaler Inhale 2  puffs into the lungs 2 (two) times daily. Patient not taking: No sig reported 05/14/20   Colon Branch, MD  mometasone-formoterol [facility]) 100-5 MCG/ACT AERO Inhale 2 puffs into the lungs in the morning and at bedtime. Patient not taking: No sig reported 05/14/20   Colon Branch, MD  pantoprazole (PROTONIX) 40 MG tablet Take 1 tablet (40 mg total) by mouth daily. 05/11/20   Colon Branch, MD  sildenafil (REVATIO) 20 MG tablet Take 3-4 tablets (60-80 mg total) by mouth at bedtime as needed. Patient not taking: No sig reported 09/16/19   Colon Branch, MD    Allergies    Patient has no known allergies.  Review of Systems   Review of Systems  Constitutional: Negative for fever.  Respiratory: Negative for cough and shortness of breath.   Cardiovascular: Negative for chest pain.  Gastrointestinal: Negative for abdominal pain, nausea and vomiting.  Musculoskeletal: Negative for back pain.  Skin: Negative for color change, rash and wound.  Allergic/Immunologic: Negative for immunocompromised state.  Neurological: Negative for weakness.  All other systems reviewed and are negative.   Physical Exam Updated Vital Signs BP 134/86 (BP Location: Right Arm)   Pulse 80   Temp 98.5 F (36.9 C) (Oral)   Resp 16   Ht 5\' 9"  (1.753 m)   Wt 77.1 kg   SpO2 95%   BMI 25.10 kg/m   Physical Exam Vitals and nursing note reviewed.  Constitutional:      General: He is not in acute distress.    Appearance: He is well-developed. He is not diaphoretic.  HENT:     Head: Normocephalic and atraumatic.  Cardiovascular:     Rate and Rhythm: Normal rate and regular rhythm.     Pulses: Normal pulses.     Heart sounds: Normal heart sounds.  Pulmonary:     Effort: Pulmonary effort is normal.     Breath sounds: Normal breath sounds.  Chest:     Chest wall: Tenderness present.    Abdominal:     Palpations: Abdomen is soft.     Tenderness: There is no abdominal tenderness.  Musculoskeletal:     Right lower leg:  No edema.     Left lower leg: No edema.  Skin:    General: Skin is warm and dry.     Findings: No erythema, lesion or rash.  Neurological:     Mental Status: He is alert and oriented to person, place, and time.  Psychiatric:        Behavior: Behavior normal.     ED Results / Procedures /  Treatments   Labs (all labs ordered are listed, but only abnormal results are displayed) Labs Reviewed - No data to display  EKG None  Radiology DG Ribs Unilateral W/Chest Right  Result Date: 07/24/2020 CLINICAL DATA:  Right chest wall and flank pain beginning 4-5 days ago. Symptoms are worsening. EXAM: RIGHT RIBS AND CHEST - 3+ VIEW COMPARISON:  05/11/2020 radiography.  Chest CT 06/01/2020. FINDINGS: There is a healing or healed fracture of the right lateral eighth rib. This could be a cause of back pain. No other rib abnormality is seen. Heart and mediastinal shadows are normal. The lungs are clear. No pneumothorax or hemothorax. No other bone abnormality seen. IMPRESSION: Healing or healed fracture of the right lateral eighth rib. This could be a cause of back pain. No active cardiopulmonary disease. Electronically Signed   By: Nelson Chimes M.D.   On: 07/24/2020 14:49    Procedures Procedures   Medications Ordered in ED Medications  lidocaine (LIDODERM) 5 % 1 patch (1 patch Transdermal Patch Applied 07/24/20 1403)    ED Course  I have reviewed the triage vital signs and the nursing notes.  Pertinent labs & imaging results that were available during my care of the patient were reviewed by me and considered in my medical decision making (see chart for details).  Clinical Course as of 07/24/20 1512  Fri Jul 24, 9380  4523 60 year old male with complaint of right mid axillary lower rib pain without injury as above. On exam, has tenderness without crepitus, ecchymosis, any rashes or lesions to suspect shingles. Plan is for Lidoderm patch and x-ray, will discharge to PCP if unrevealing with  plan to continue Lidoderm patches. [LM]  3545 X-ray shows healing right eighth rib fracture.  Discussed with patient, managed with Lidoderm patches, also given prescription for Flexeril for complaint of muscle spasms.  Advised to follow-up with PCP for further pain management. [LM]    Clinical Course User Index [LM] Roque Lias   MDM Rules/Calculators/A&P                          Final Clinical Impression(s) / ED Diagnoses Final diagnoses:  Right-sided chest wall pain  Closed fracture of one rib of right side, initial encounter    Rx / DC Orders ED Discharge Orders         Ordered    lidocaine (LIDODERM) 5 %  Every 24 hours        07/24/20 1454    cyclobenzaprine (FLEXERIL) 10 MG tablet  2 times daily PRN        07/24/20 1508           Tacy Learn, PA-C 07/24/20 1512    Sherwood Gambler, MD 07/25/20 1718

## 2020-07-24 NOTE — ED Notes (Signed)
To x-ray

## 2020-09-02 ENCOUNTER — Ambulatory Visit (INDEPENDENT_AMBULATORY_CARE_PROVIDER_SITE_OTHER): Payer: 59 | Admitting: Internal Medicine

## 2020-09-02 ENCOUNTER — Other Ambulatory Visit: Payer: Self-pay

## 2020-09-02 ENCOUNTER — Encounter: Payer: Self-pay | Admitting: Internal Medicine

## 2020-09-02 VITALS — BP 155/77 | HR 88 | Temp 99.5°F | Resp 16 | Wt 169.6 lb

## 2020-09-02 DIAGNOSIS — J449 Chronic obstructive pulmonary disease, unspecified: Secondary | ICD-10-CM | POA: Diagnosis not present

## 2020-09-02 DIAGNOSIS — I10 Essential (primary) hypertension: Secondary | ICD-10-CM | POA: Diagnosis not present

## 2020-09-02 DIAGNOSIS — E785 Hyperlipidemia, unspecified: Secondary | ICD-10-CM

## 2020-09-02 MED ORDER — BUDESONIDE-FORMOTEROL FUMARATE 80-4.5 MCG/ACT IN AERO: 2.0000 | INHALATION_SPRAY | Freq: Two times a day (BID) | RESPIRATORY_TRACT | 5 refills | Status: DC

## 2020-09-02 MED ORDER — ROSUVASTATIN CALCIUM 20 MG PO TABS: 20.0000 mg | ORAL_TABLET | Freq: Every day | ORAL | 1 refills | Status: DC

## 2020-09-02 NOTE — Patient Instructions (Addendum)
Check the  blood pressure weekly BP GOAL is between 110/65 and  135/85. If it is consistently higher or lower, let me know  Once you can, start Symbicort, see paper prescription  GO TO THE LAB : Get the blood work     [place], [place] back for a check up in 4-5 months      DASH Eating Plan DASH stands for Dietary Approaches to Stop Hypertension. The DASH eating plan is a healthy eating plan that has been shown to:  Reduce high blood pressure (hypertension).  Reduce your risk for type 2 diabetes, heart disease, and stroke.  Help with weight loss. What are tips for following this plan? Reading food labels  Check food labels for the amount of salt (sodium) per serving. Choose foods with less than 5 percent of the Daily Value of sodium. Generally, foods with less than 300 milligrams (mg) of sodium per serving fit into this eating plan.  To find whole grains, look for the word "whole" as the first word in the ingredient list. Shopping  Buy products labeled as "low-sodium" or "no salt added."  Buy fresh foods. Avoid canned foods and pre-made or frozen meals. Cooking  Avoid adding salt when cooking. Use salt-free seasonings or herbs instead of table salt or sea salt. Check with your health care provider or pharmacist before using salt substitutes.  Do not fry foods. Cook foods using healthy methods such as baking, boiling, grilling, roasting, and broiling instead.  Cook with heart-healthy oils, such as olive, canola, avocado, soybean, or sunflower oil. Meal planning  Eat a balanced diet that includes: ? 4 or more servings of fruits and 4 or more servings of vegetables each day. Try to fill one-half of your plate with fruits and vegetables. ? 6-8 servings of whole grains each day. ? Less than 6 oz (170 g) of lean meat, poultry, or fish each day. A 3-oz (85-g) serving of meat is about the same size as a deck of cards. One egg equals 1 oz  (28 g). ? 2-3 servings of low-fat dairy each day. One serving is 1 cup (237 mL). ? 1 serving of nuts, seeds, or beans 5 times each week. ? 2-3 servings of heart-healthy fats. Healthy fats called omega-3 fatty acids are found in foods such as walnuts, flaxseeds, fortified milks, and eggs. These fats are also found in cold-water fish, such as sardines, salmon, and mackerel.  Limit how much you eat of: ? Canned or prepackaged foods. ? Food that is high in trans fat, such as some fried foods. ? Food that is high in saturated fat, such as fatty meat. ? Desserts and other sweets, sugary drinks, and other foods with added sugar. ? Full-fat dairy products.  Do not salt foods before eating.  Do not eat more than 4 egg yolks a week.  Try to eat at least 2 vegetarian meals a week.  Eat more home-cooked food and less restaurant, buffet, and fast food.   Lifestyle  When eating at a restaurant, ask that your food be prepared with less salt or no salt, if possible.  If you drink alcohol: ? Limit how much you use to:  0-1 drink a day for women who are not pregnant.  0-2 drinks a day for men. ? Be aware of how much alcohol is in your drink. In the [place] one drink equals one 12 oz bottle of beer (355 mL), one 5 oz glass  of wine (148 mL), or one 1 oz glass of hard liquor (44 mL). General information  Avoid eating more than 2,300 mg of salt a day. If you have hypertension, you may need to reduce your sodium intake to 1,500 mg a day.  Work with your health care provider to maintain a healthy body weight or to lose weight. Ask what an ideal weight is for you.  Get at least 30 minutes of exercise that causes your heart to beat faster (aerobic exercise) most days of the week. Activities may include walking, swimming, or biking.  Work with your health care provider or dietitian to adjust your eating plan to your individual calorie needs. What foods should I eat? Fruits All fresh, dried, or frozen  fruit. Canned fruit in natural juice (without added sugar). Vegetables Fresh or frozen vegetables (raw, steamed, roasted, or grilled). Low-sodium or reduced-sodium tomato and vegetable juice. Low-sodium or reduced-sodium tomato sauce and tomato paste. Low-sodium or reduced-sodium canned vegetables. Grains Whole-grain or whole-wheat bread. Whole-grain or whole-wheat pasta. Brown rice. [place]. Bulgur. Whole-grain and low-sodium cereals. Pita bread. Low-fat, low-sodium crackers. Whole-wheat flour tortillas. Meats and other proteins Skinless chicken or [place]. Ground chicken or [place]. Pork with fat trimmed off. Fish and seafood. Egg whites. Dried beans, peas, or lentils. Unsalted nuts, nut butters, and seeds. Unsalted canned beans. Lean cuts of beef with fat trimmed off. Low-sodium, lean precooked or cured meat, such as sausages or meat loaves. Dairy Low-fat (1%) or fat-free (skim) milk. Reduced-fat, low-fat, or fat-free cheeses. Nonfat, low-sodium ricotta or cottage cheese. Low-fat or nonfat yogurt. Low-fat, low-sodium cheese. Fats and oils Soft margarine without trans fats. Vegetable oil. Reduced-fat, low-fat, or light mayonnaise and salad dressings (reduced-sodium). Canola, safflower, olive, avocado, soybean, and sunflower oils. Avocado. Seasonings and condiments Herbs. Spices. Seasoning mixes without salt. Other foods Unsalted popcorn and pretzels. Fat-free sweets. The items listed above may not be a complete list of foods and beverages you can eat. Contact a dietitian for more information. What foods should I avoid? Fruits Canned fruit in a light or heavy syrup. Fried fruit. Fruit in cream or butter sauce. Vegetables Creamed or fried vegetables. Vegetables in a cheese sauce. Regular canned vegetables (not low-sodium or reduced-sodium). Regular canned tomato sauce and paste (not low-sodium or reduced-sodium). Regular tomato and vegetable juice (not low-sodium or reduced-sodium).  Angie Fava. Olives. Grains Baked goods made with fat, such as croissants, muffins, or some breads. Dry pasta or rice meal packs. Meats and other proteins Fatty cuts of meat. Ribs. Fried meat. Berniece Salines. Bologna, salami, and other precooked or cured meats, such as sausages or meat loaves. Fat from the back of a pig (fatback). Bratwurst. Salted nuts and seeds. Canned beans with added salt. Canned or smoked fish. Whole eggs or egg yolks. Chicken or [place] with skin. Dairy Whole or 2% milk, cream, and half-and-half. Whole or full-fat cream cheese. Whole-fat or sweetened yogurt. Full-fat cheese. Nondairy creamers. Whipped toppings. Processed cheese and cheese spreads. Fats and oils Butter. Stick margarine. Lard. Shortening. Ghee. Bacon fat. Tropical oils, such as coconut, palm kernel, or palm oil. Seasonings and condiments Onion salt, garlic salt, seasoned salt, table salt, and sea salt. Worcestershire sauce. Tartar sauce. Barbecue sauce. Teriyaki sauce. Soy sauce, including reduced-sodium. Steak sauce. Canned and packaged gravies. Fish sauce. Oyster sauce. Cocktail sauce. Store-bought horseradish. Ketchup. Mustard. Meat flavorings and tenderizers. Bouillon cubes. Hot sauces. Pre-made or packaged marinades. Pre-made or packaged taco seasonings. Relishes. Regular salad dressings. Other foods Salted popcorn and pretzels. The  items listed above may not be a complete list of foods and beverages you should avoid. Contact a dietitian for more information. Where to find more information  National Heart, Lung, and Blood Institute: https://wilson-eaton.com/  American Heart Association: www.heart.org  Academy of Nutrition and Dietetics: www.eatright.[place]: www.kidney.org Summary  The DASH eating plan is a healthy eating plan that has been shown to reduce high blood pressure (hypertension). It may also reduce your risk for type 2 diabetes, heart disease, and stroke.  When on the DASH eating  plan, aim to eat more fresh fruits and vegetables, whole grains, lean proteins, low-fat dairy, and heart-healthy fats.  With the DASH eating plan, you should limit salt (sodium) intake to 2,300 mg a day. If you have hypertension, you may need to reduce your sodium intake to 1,500 mg a day.  Work with your health care provider or dietitian to adjust your eating plan to your individual calorie needs. This information is not intended to replace advice given to you by your health care provider. Make sure you discuss any questions you have with your health care provider. Document Revised: 03/22/2019 Document Reviewed: 03/22/2019 Elsevier Patient Education  2021 [employer].

## 2020-09-02 NOTE — Progress Notes (Signed)
Subjective:    Patient ID: Richard Gates, male    DOB: 1960-05-07, 60 y.o.   MRN: [id number]  DOS:  09/02/2020 Type of visit - description: Follow-up To discuss his chronic medical problems.  At the last visit, he complained of right-sided chest pain, x-ray was negative, subsequently in March went to the ER with same symptoms, rib series show a healing fracture.  Overall pain is better.  HTN: Good med compliance, ambulatory BPs in the 130s.  COPD: Unable to afford anything but albuterol, has some cough, occasional sputum production.   Review of Systems See above   Past Medical History:  Diagnosis Date  . COPD (chronic obstructive pulmonary disease) ([place])   . Coronary artery calcification seen on CAT scan 08/2019   Coronary calcium score 103; short LM with<25% mixed calcific plaque (minimal); aortic atherosclerosis with normal size.  No dissection.  No aortic valve calcification  . GERD (gastroesophageal reflux disease)   . Hyperlipidemia   . Hypertension     Past Surgical History:  Procedure Laterality Date  . APPENDECTOMY    . COLONOSCOPY  08/24/2016  . POLYPECTOMY    . TRANSTHORACIC ECHOCARDIOGRAM  04/2018   EF 65-70% (vigorous). No RWMA.  Gr 1 DD.  Normal valves.  (In setting of COPD exacerbation)    Allergies as of 09/02/2020   No Known Allergies     Medication List       Accurate as of Sep 02, 2020 11:59 PM. If you have any questions, ask your nurse or doctor.        STOP taking these medications   mometasone-formoterol 100-5 MCG/ACT Aero Commonly known as: DULERA Stopped by: Kathlene November, MD   Pfizer-BioNTech COVID-19 Vacc 30 MCG/0.3ML injection Generic drug: COVID-19 mRNA vaccine Therapist, music) Stopped by: Kathlene November, MD     TAKE these medications   albuterol 108 (90 Base) MCG/ACT inhaler Commonly known as: VENTOLIN HFA Inhale 2 puffs into the lungs every 6 (six) hours as needed for wheezing or shortness of breath.   amLODipine 10 MG tablet Commonly known  as: NORVASC Take 1 tablet (10 mg total) by mouth daily.   budesonide-formoterol 80-4.5 MCG/ACT inhaler Commonly known as: SYMBICORT Inhale 2 puffs into the lungs 2 (two) times daily.   cyclobenzaprine 10 MG tablet Commonly known as: FLEXERIL TAKE 1 TABLET BY MOUTH 2 TIMES DAILY AS NEEDED FOR MUSCLE SPASMS   lidocaine 5 % Commonly known as: LIDODERM PLACE 1 PATCH ONTO THE SKIN DAILY. REMOVE & DISCARD PATCH WITHIN 12 HOURS OR AS DIRECTED BY MD   metoprolol tartrate 50 MG tablet Commonly known as: LOPRESSOR Take 1 tablet (50 mg total) by mouth 2 (two) times daily.   pantoprazole 40 MG tablet Commonly known as: PROTONIX Take 1 tablet (40 mg total) by mouth daily.   rosuvastatin 20 MG tablet Commonly known as: CRESTOR Take 1 tablet (20 mg total) by mouth daily.   sildenafil 20 MG tablet Commonly known as: REVATIO Take 3-4 tablets (60-80 mg total) by mouth at bedtime as needed.          Objective:   Physical Exam BP (!) 155/77   Pulse 88   Temp 99.5 F (37.5 C)   Resp 16   Wt 169 lb 9.6 oz (76.9 kg)   SpO2 93%   BMI 25.05 kg/m  General:   Well developed, NAD, BMI noted. HEENT:  Normocephalic . Face symmetric, atraumatic Lungs:  Few rhonchi, no wheezing. Normal respiratory effort, no intercostal retractions,  no accessory muscle use. Heart: RRR,  no murmur.  Lower extremities: no pretibial edema bilaterally  Skin: Not pale. Not jaundice Neurologic:  alert & oriented X3.  Speech normal, gait appropriate for age and unassisted Psych--  Cognition and judgment appear intact.  Cooperative with normal attention span and concentration.  Behavior appropriate. No anxious or depressed appearing.      Assessment     Assessment HTN GERD Hyperlipidemia, high TG Allergies , nasal  COPD, PFTs 2013 showed ratio of 58, FEV1 of 1.78- 52% without significant bronchodilator response, TLC was 77% with DLCO 78% Smoker: quit 2020  PLAN: HTN: On amlodipine, metoprolol, BP  today upon arrival 155/77, recheck 140/80.  No change, recommend ambulatory BPs, DASH diet.  Reassess on RTC Dyslipidemia: Not well controlled. On Crestor, last LDL very good, TG in the 500s.  Recheck FLP, check a TSH.  Continue Crestor. COPD: Not well controlled On albuterol only, unable to afford other medications.  I printed a prescription for Symbicort, he hopes that in the next few weeks his insurance will change and he will be able to afford it. RTC 4 to 5 months   This visit occurred during the SARS-CoV-2 public health emergency.  Safety protocols were in place, including screening questions prior to the visit, additional usage of staff PPE, and extensive cleaning of exam room while observing appropriate contact time as indicated for disinfecting solutions.

## 2020-09-03 LAB — LIPID PANEL
Cholesterol: 182 mg/dL (ref 0–200)
HDL: 80 mg/dL (ref >39.00)
NonHDL: 102
Total CHOL/HDL Ratio: 2
Triglycerides: 387 mg/dL — ABNORMAL HIGH (ref 0.0–149.0)
VLDL: 77.4 mg/dL — ABNORMAL HIGH (ref 0.0–40.0)

## 2020-09-03 LAB — TSH: TSH: 2.11 u[IU]/mL (ref 0.35–4.50)

## 2020-09-03 LAB — LDL CHOLESTEROL, DIRECT: Direct LDL: 49 mg/dL

## 2020-09-03 NOTE — Assessment & Plan Note (Addendum)
  HTN: On amlodipine, metoprolol, BP today upon arrival 155/77, recheck 140/80.  No change, recommend ambulatory BPs, DASH diet.  Reassess on RTC Dyslipidemia: Not well controlled. On Crestor, last LDL very good, TG in the 500s.  Recheck FLP, check a TSH.  Continue Crestor. COPD: Not well controlled On albuterol only, unable to afford other medications.  I printed a prescription for Symbicort, he hopes that in the next few weeks his insurance will change and he will be able to afford it. RTC 4 to 5 months

## 2020-09-30 ENCOUNTER — Telehealth: Payer: Self-pay | Admitting: Internal Medicine

## 2020-09-30 MED ORDER — ALBUTEROL SULFATE HFA 108 (90 BASE) MCG/ACT IN AERS: 2.0000 | INHALATION_SPRAY | Freq: Four times a day (QID) | RESPIRATORY_TRACT | 5 refills | Status: DC | PRN

## 2020-09-30 NOTE — Telephone Encounter (Signed)
Rx sent 

## 2020-09-30 NOTE — Telephone Encounter (Signed)
Medication: albuterol (VENTOLIN HFA) 108 (90 Base) MCG/ACT inhaler [[id number]]      Has the patient contacted their pharmacy? no (If no, request that the patient contact the pharmacy for the refill.) (If yes, when and what did the pharmacy advise?)    Preferred Pharmacy (with phone number or street name): [facility], [address]  [place], [place] [address]  Phone:  [phone] Fax:  [phone]   Agent: Please be advised that RX refills may take up to 3 business days. We ask that you follow-up with your pharmacy.

## 2020-11-11 ENCOUNTER — Other Ambulatory Visit: Payer: Self-pay

## 2020-11-11 ENCOUNTER — Other Ambulatory Visit: Payer: Self-pay | Admitting: Internal Medicine

## 2020-11-11 MED ORDER — ALBUTEROL SULFATE HFA 108 (90 BASE) MCG/ACT IN AERS: 2.0000 | INHALATION_SPRAY | Freq: Four times a day (QID) | RESPIRATORY_TRACT | 5 refills | Status: DC | PRN

## 2020-11-11 MED ORDER — AMLODIPINE BESYLATE 10 MG PO TABS: 10.0000 mg | ORAL_TABLET | Freq: Every day | ORAL | 1 refills | Status: DC

## 2021-01-11 ENCOUNTER — Telehealth: Payer: Self-pay | Admitting: Internal Medicine

## 2021-02-02 ENCOUNTER — Ambulatory Visit (INDEPENDENT_AMBULATORY_CARE_PROVIDER_SITE_OTHER): Payer: 59 | Admitting: Internal Medicine

## 2021-02-02 ENCOUNTER — Other Ambulatory Visit: Payer: Self-pay

## 2021-02-02 ENCOUNTER — Encounter: Payer: Self-pay | Admitting: Internal Medicine

## 2021-02-02 VITALS — BP 172/100 | HR 105 | Temp 98.6°F | Resp 16 | Ht 68.0 in | Wt 174.5 lb

## 2021-02-02 DIAGNOSIS — I10 Essential (primary) hypertension: Secondary | ICD-10-CM | POA: Diagnosis not present

## 2021-02-02 DIAGNOSIS — R809 Proteinuria, unspecified: Secondary | ICD-10-CM

## 2021-02-02 DIAGNOSIS — Z23 Encounter for immunization: Secondary | ICD-10-CM

## 2021-02-02 DIAGNOSIS — R82998 Other abnormal findings in urine: Secondary | ICD-10-CM

## 2021-02-02 DIAGNOSIS — J449 Chronic obstructive pulmonary disease, unspecified: Secondary | ICD-10-CM

## 2021-02-02 DIAGNOSIS — E785 Hyperlipidemia, unspecified: Secondary | ICD-10-CM

## 2021-02-02 MED ORDER — BEVESPI AEROSPHERE 9-4.8 MCG/ACT IN AERO: 2.0000 | INHALATION_SPRAY | Freq: Two times a day (BID) | RESPIRATORY_TRACT | 6 refills | Status: DC

## 2021-02-02 MED ORDER — LOSARTAN POTASSIUM 50 MG PO TABS: 50.0000 mg | ORAL_TABLET | Freq: Every day | ORAL | 1 refills | Status: DC

## 2021-02-02 NOTE — Progress Notes (Signed)
Subjective:    Patient ID: Richard Gates, male    DOB: 04-03-1961, 60 y.o.   MRN: [id number]  DOS:  02/02/2021 Type of visit - description: f/u  Since the last office visit is stable. Not taking Symbicort Denies cough, wheezing.  Able to walk 2 blocks before she needs to stop due to difficulty breathing. No chest pain No dysuria, gross hematuria or difficulty urinating  BP Readings from Last 3 Encounters:  02/02/21 (!) 172/100  09/02/20 (!) 155/77  07/24/20 134/86     Review of Systems See above   Past Medical History:  Diagnosis Date   COPD (chronic obstructive pulmonary disease) ([place])    Coronary artery calcification seen on CAT scan 08/2019   Coronary calcium score 103; short LM with<25% mixed calcific plaque (minimal); aortic atherosclerosis with normal size.  No dissection.  No aortic valve calcification   GERD (gastroesophageal reflux disease)    Hyperlipidemia    Hypertension     Past Surgical History:  Procedure Laterality Date   APPENDECTOMY     COLONOSCOPY  08/24/2016   POLYPECTOMY     TRANSTHORACIC ECHOCARDIOGRAM  04/2018   EF 65-70% (vigorous). No RWMA.  Gr 1 DD.  Normal valves.  (In setting of COPD exacerbation)    Allergies as of 02/02/2021   No Known Allergies      Medication List        Accurate as of February 02, 2021 11:59 PM. If you have any questions, ask your nurse or doctor.          STOP taking these medications    budesonide-formoterol 80-4.5 MCG/ACT inhaler Commonly known as: SYMBICORT Stopped by: Kathlene November, MD   cyclobenzaprine 10 MG tablet Commonly known as: FLEXERIL Stopped by: Kathlene November, MD   lidocaine 5 % Commonly known as: LIDODERM Stopped by: Kathlene November, MD       TAKE these medications    albuterol 108 (90 Base) MCG/ACT inhaler Commonly known as: VENTOLIN HFA Inhale 2 puffs into the lungs every 6 (six) hours as needed for wheezing or shortness of breath.   amLODipine 10 MG tablet Commonly known as:  NORVASC Take 1 tablet (10 mg total) by mouth daily.   Bevespi Aerosphere 9-4.8 MCG/ACT Aero Generic drug: Glycopyrrolate-Formoterol Inhale 2 puffs into the lungs 2 (two) times daily. Started by: Kathlene November, MD   losartan 50 MG tablet Commonly known as: COZAAR Take 1 tablet (50 mg total) by mouth daily. Started by: Kathlene November, MD   metoprolol tartrate 50 MG tablet Commonly known as: LOPRESSOR Take 1 tablet (50 mg total) by mouth 2 (two) times daily.   pantoprazole 40 MG tablet Commonly known as: PROTONIX Take 1 tablet (40 mg total) by mouth daily.   rosuvastatin 20 MG tablet Commonly known as: CRESTOR Take 1 tablet (20 mg total) by mouth daily.   sildenafil 20 MG tablet Commonly known as: REVATIO Take 3-4 tablets (60-80 mg total) by mouth at bedtime as needed.           Objective:   Physical Exam BP (!) 172/100 (BP Location: Right Arm, Patient Position: Sitting, Cuff Size: Small)   Pulse (!) 105   Temp 98.6 F (37 C) (Oral)   Resp 16   Ht 5\' 8"  (1.727 m)   Wt 174 lb 8 oz (79.2 kg)   SpO2 96%   BMI 26.53 kg/m  General:   Well developed, NAD, BMI noted. HEENT:  Normocephalic . Face symmetric, atraumatic Lungs:  CTA B Normal respiratory effort, no intercostal retractions, no accessory muscle use. Heart: RRR,  no murmur.  Lower extremities: no pretibial edema bilaterally  Skin: Not pale. Not jaundice Neurologic:  alert & oriented X3.  Speech normal, gait appropriate for age and unassisted Psych--  Cognition and judgment appear intact.  Cooperative with normal attention span and concentration.  Behavior appropriate. No anxious or depressed appearing.      Assessment    ASSESSMENT Hyperglycemia HTN GERD Hyperlipidemia, high TG Allergies , nasal  COPD, PFTs 2013 showed ratio of 58, FEV1 of 1.78- 52% without significant bronchodilator response, TLC was 77% with DLCO 78% Smoker: quit 2020 Aortic sclerosis per CT 05-2020 Coronary CTA, Ca+ Co score 103.0, L  main artery, saw cards, probably still a low risk situation.  Plan is CV RF,  ASA not warranted at this point.  PLAN: Hyperglycemia: Check A1c HTN: BP slightly elevated when she arrived, at home it varies from the 120s to 160s.  No apparent reason. Current meds: Amlodipine 10 mg, metoprolol 50 mg twice daily BP recheck: 170/100 Needs better control, add losartan 50 mg.  CMP and CBC in 2 weeks.  Monitor BPs. Dyslipidemia: Last LDL 49, triglycerides improved from 568-387.  Currently on Crestor.  No change. Sterile bacteriuria & proteinuria: See urinalyses from last year, no symptoms, check a UA, urine culture and microalbumin. COPD: Unable to afford Symbicort, on albuterol BID regularly, will try Bevespi, although doubt it will be less expensive Preventive care: Flu shot and tetanus shot today. Recommend COVID booster. RTC blood work in 2 weeks RTC checkup 3 months   This visit occurred during the SARS-CoV-2 public health emergency.  Safety protocols were in place, including screening questions prior to the visit, additional usage of staff PPE, and extensive cleaning of exam room while observing appropriate contact time as indicated for disinfecting solutions.

## 2021-02-02 NOTE — Patient Instructions (Addendum)
Recommend to proceed with the following vaccine at your pharmacy in about 30 days:  Covid #4  Provide a urine sample before you leave  Start losartan 50 mg 1 tablet daily to better control your blood pressure  Check the  blood pressure daily  BP GOAL is between 110/65 and  135/85. If it is consistently higher or lower, let me know  Start Bevespi 2 puffs twice daily Use albuterol only as needed    GO TO THE FRONT DESK, [place] back for work only in 2 weeks   Come back for a checkup in 3 months

## 2021-02-03 ENCOUNTER — Other Ambulatory Visit: Payer: 59

## 2021-02-03 ENCOUNTER — Other Ambulatory Visit (INDEPENDENT_AMBULATORY_CARE_PROVIDER_SITE_OTHER): Payer: 59

## 2021-02-03 DIAGNOSIS — R82998 Other abnormal findings in urine: Secondary | ICD-10-CM

## 2021-02-03 DIAGNOSIS — R809 Proteinuria, unspecified: Secondary | ICD-10-CM | POA: Diagnosis not present

## 2021-02-03 LAB — URINALYSIS, ROUTINE W REFLEX MICROSCOPIC
Bilirubin Urine: NEGATIVE
Ketones, ur: NEGATIVE
Leukocytes,Ua: NEGATIVE
Nitrite: NEGATIVE
Specific Gravity, Urine: <=1.005 — AB (ref 1.000–1.030)
Total Protein, Urine: 30 — AB
Urine Glucose: NEGATIVE
Urobilinogen, UA: 0.2 (ref 0.0–1.0)
pH: 6 (ref 5.0–8.0)

## 2021-02-03 LAB — MICROALBUMIN / CREATININE URINE RATIO
Creatinine,U: 78.9 mg/dL
Microalb Creat Ratio: 24.1 mg/g (ref 0.0–30.0)
Microalb, Ur: 19 mg/dL — ABNORMAL HIGH (ref 0.0–1.9)

## 2021-02-03 NOTE — Assessment & Plan Note (Signed)
Hyperglycemia: Check A1c HTN: BP slightly elevated when she arrived, at home it varies from the 120s to 160s.  No apparent reason. Current meds: Amlodipine 10 mg, metoprolol 50 mg twice daily BP recheck: 170/100 Needs better control, add losartan 50 mg.  CMP and CBC in 2 weeks.  Monitor BPs. Dyslipidemia: Last LDL 49, triglycerides improved from 568-387.  Currently on Crestor.  No change. Sterile bacteriuria & proteinuria: See urinalyses from last year, no symptoms, check a UA, urine culture and microalbumin. COPD: Unable to afford Symbicort, on albuterol BID regularly, will try Bevespi, although doubt it will be less expensive Preventive care: Flu shot and tetanus shot today. Recommend COVID booster. RTC blood work in 2 weeks RTC checkup 3 months

## 2021-02-05 LAB — URINE CULTURE
MICRO NUMBER:: 12464455
SPECIMEN QUALITY:: ADEQUATE

## 2021-02-05 MED ORDER — AMOXICILLIN 875 MG PO TABS: 875.0000 mg | ORAL_TABLET | Freq: Two times a day (BID) | ORAL | 0 refills | Status: DC

## 2021-02-16 ENCOUNTER — Other Ambulatory Visit: Payer: Self-pay

## 2021-02-16 ENCOUNTER — Other Ambulatory Visit (INDEPENDENT_AMBULATORY_CARE_PROVIDER_SITE_OTHER): Payer: 59

## 2021-02-16 DIAGNOSIS — I10 Essential (primary) hypertension: Secondary | ICD-10-CM | POA: Diagnosis not present

## 2021-02-17 ENCOUNTER — Telehealth: Payer: Self-pay

## 2021-02-17 DIAGNOSIS — I10 Essential (primary) hypertension: Secondary | ICD-10-CM

## 2021-02-17 DIAGNOSIS — D649 Anemia, unspecified: Secondary | ICD-10-CM

## 2021-02-17 DIAGNOSIS — E876 Hypokalemia: Secondary | ICD-10-CM

## 2021-02-17 LAB — COMPREHENSIVE METABOLIC PANEL
ALT: 20 U/L (ref 0–53)
AST: 40 U/L — ABNORMAL HIGH (ref 0–37)
Albumin: 4 g/dL (ref 3.5–5.2)
Alkaline Phosphatase: 125 U/L — ABNORMAL HIGH (ref 39–117)
BUN: 11 mg/dL (ref 6–23)
CO2: 29 mEq/L (ref 19–32)
Calcium: 6.2 mg/dL — ABNORMAL LOW (ref 8.4–10.5)
Chloride: 97 mEq/L (ref 96–112)
Creatinine, Ser: 1.64 mg/dL — ABNORMAL HIGH (ref 0.40–1.50)
GFR: 45.07 mL/min — ABNORMAL LOW (ref >60.00)
Glucose, Bld: 108 mg/dL — ABNORMAL HIGH (ref 70–99)
Potassium: 2.7 mEq/L — CL (ref 3.5–5.1)
Sodium: 140 mEq/L (ref 135–145)
Total Bilirubin: 0.5 mg/dL (ref 0.2–1.2)
Total Protein: 7.2 g/dL (ref 6.0–8.3)

## 2021-02-17 LAB — CBC WITH DIFFERENTIAL/PLATELET
Basophils Absolute: 0 10*3/uL (ref 0.0–0.1)
Basophils Relative: 0.5 % (ref 0.0–3.0)
Eosinophils Absolute: 0.6 10*3/uL (ref 0.0–0.7)
Eosinophils Relative: 6.2 % — ABNORMAL HIGH (ref 0.0–5.0)
HCT: 34.2 % — ABNORMAL LOW (ref 39.0–52.0)
Hemoglobin: 11.5 g/dL — ABNORMAL LOW (ref 13.0–17.0)
Lymphocytes Relative: 12.4 % (ref 12.0–46.0)
Lymphs Abs: 1.3 10*3/uL (ref 0.7–4.0)
MCHC: 33.6 g/dL (ref 30.0–36.0)
MCV: 103.6 fl — ABNORMAL HIGH (ref 78.0–100.0)
Monocytes Absolute: 0.8 10*3/uL (ref 0.1–1.0)
Monocytes Relative: 8 % (ref 3.0–12.0)
Neutro Abs: 7.4 10*3/uL (ref 1.4–7.7)
Neutrophils Relative %: 72.9 % (ref 43.0–77.0)
Platelets: 354 10*3/uL (ref 150.0–400.0)
RBC: 3.31 Mil/uL — ABNORMAL LOW (ref 4.22–5.81)
RDW: 15.4 % (ref 11.5–15.5)
WBC: 10.1 10*3/uL (ref 4.0–10.5)

## 2021-02-17 LAB — HEMOGLOBIN A1C: Hgb A1c MFr Bld: 5.8 % (ref 4.6–6.5)

## 2021-02-17 MED ORDER — POTASSIUM CHLORIDE CRYS ER 10 MEQ PO TBCR: 10.0000 meq | EXTENDED_RELEASE_TABLET | Freq: Every day | ORAL | 0 refills | Status: DC

## 2021-02-17 NOTE — Telephone Encounter (Signed)
LMOM asking for call back.  

## 2021-02-17 NOTE — Addendum Note (Signed)
Addended byDamita Dunnings D on: 02/17/2021 01:13 PM   Modules accepted: Orders

## 2021-02-17 NOTE — Telephone Encounter (Signed)
Spoke w/ Pt- informed of results and recommendations. Pt requested I send information to him via Mychart. Lab appt scheduled 10/31.

## 2021-02-17 NOTE — Telephone Encounter (Signed)
CRITICAL VALUE STICKER  CRITICAL VALUE: Potassium 2.7  RECEIVER (on-site recipient of call): Catera Hankins, RMA  DATE & TIME NOTIFIED: 02/17/2021 at 12:25 pm  MESSENGER (representative from lab): Festus Holts  MD NOTIFIED: Kathlene November, MD   TIME OF NOTIFICATION: 12:32  RESPONSE:

## 2021-02-17 NOTE — Telephone Encounter (Addendum)
Potassium 2.7, creatinine increased to 1.6.  Hemoglobin lower than baseline. I rx losartan few days ago, I am surprised by the low potassium. Plan: Stop losartan Send KCl 10 mEq 1 tablet daily x5 days, #5 no refills Check BMP in 10 days   DX hypertension. Recheck CBC, iron panel in 10 days, DX anemia Good hydration with water Also, ask patient to continue checking his blood pressures.  Call me if they are extremely high or low.

## 2021-03-01 ENCOUNTER — Telehealth: Payer: Self-pay | Admitting: Internal Medicine

## 2021-03-01 ENCOUNTER — Other Ambulatory Visit (INDEPENDENT_AMBULATORY_CARE_PROVIDER_SITE_OTHER): Payer: 59

## 2021-03-01 ENCOUNTER — Ambulatory Visit: Payer: 59 | Attending: Internal Medicine

## 2021-03-01 ENCOUNTER — Other Ambulatory Visit: Payer: Self-pay

## 2021-03-01 DIAGNOSIS — E876 Hypokalemia: Secondary | ICD-10-CM | POA: Diagnosis not present

## 2021-03-01 DIAGNOSIS — I10 Essential (primary) hypertension: Secondary | ICD-10-CM | POA: Diagnosis not present

## 2021-03-01 DIAGNOSIS — D649 Anemia, unspecified: Secondary | ICD-10-CM

## 2021-03-01 DIAGNOSIS — Z23 Encounter for immunization: Secondary | ICD-10-CM

## 2021-03-01 NOTE — Telephone Encounter (Signed)
Waiting blood work results from today.

## 2021-03-01 NOTE — Telephone Encounter (Signed)
Pt. Called and stated Dr.Paz took him off of his heart medicine while he took the potassium pills. Now that he is done with the potassium pills, he wants to know should he start back on his heart medication.

## 2021-03-01 NOTE — Telephone Encounter (Signed)
Please advise 

## 2021-03-02 LAB — CBC WITH DIFFERENTIAL/PLATELET
Basophils Absolute: 0.1 10*3/uL (ref 0.0–0.1)
Basophils Relative: 1.2 % (ref 0.0–3.0)
Eosinophils Absolute: 0.4 10*3/uL (ref 0.0–0.7)
Eosinophils Relative: 5.5 % — ABNORMAL HIGH (ref 0.0–5.0)
HCT: 35.7 % — ABNORMAL LOW (ref 39.0–52.0)
Hemoglobin: 11.8 g/dL — ABNORMAL LOW (ref 13.0–17.0)
Lymphocytes Relative: 21.4 % (ref 12.0–46.0)
Lymphs Abs: 1.4 10*3/uL (ref 0.7–4.0)
MCHC: 33.1 g/dL (ref 30.0–36.0)
MCV: 103 fl — ABNORMAL HIGH (ref 78.0–100.0)
Monocytes Absolute: 0.6 10*3/uL (ref 0.1–1.0)
Monocytes Relative: 9.4 % (ref 3.0–12.0)
Neutro Abs: 4 10*3/uL (ref 1.4–7.7)
Neutrophils Relative %: 62.5 % (ref 43.0–77.0)
Platelets: 322 10*3/uL (ref 150.0–400.0)
RBC: 3.47 Mil/uL — ABNORMAL LOW (ref 4.22–5.81)
RDW: 15.3 % (ref 11.5–15.5)
WBC: 6.5 10*3/uL (ref 4.0–10.5)

## 2021-03-02 LAB — BASIC METABOLIC PANEL
BUN: 6 mg/dL (ref 6–23)
CO2: 32 mEq/L (ref 19–32)
Calcium: 6.6 mg/dL — ABNORMAL LOW (ref 8.4–10.5)
Chloride: 94 mEq/L — ABNORMAL LOW (ref 96–112)
Creatinine, Ser: 1.04 mg/dL (ref 0.40–1.50)
GFR: 77.83 mL/min (ref >60.00)
Glucose, Bld: 95 mg/dL (ref 70–99)
Potassium: 3.1 mEq/L — ABNORMAL LOW (ref 3.5–5.1)
Sodium: 142 mEq/L (ref 135–145)

## 2021-03-02 LAB — IBC + FERRITIN
Ferritin: 827.2 ng/mL — ABNORMAL HIGH (ref 22.0–322.0)
Iron: 137 ug/dL (ref 42–165)
Saturation Ratios: 45.9 % (ref 20.0–50.0)
TIBC: 298.2 ug/dL (ref 250.0–450.0)
Transferrin: 213 mg/dL (ref 212.0–360.0)

## 2021-03-02 MED ORDER — POTASSIUM CHLORIDE CRYS ER 10 MEQ PO TBCR: 10.0000 meq | EXTENDED_RELEASE_TABLET | Freq: Every day | ORAL | 0 refills | Status: DC

## 2021-03-02 NOTE — Telephone Encounter (Signed)
Pt has been scheduled. 04/01/21 at 4pm.

## 2021-03-02 NOTE — Telephone Encounter (Signed)
Richard Gates please call the patient and schedule a follow-up with me in person in 2 to 3 weeks  ====  Labs reviewed: - Potassium improved.  Still in the low side. - Creatinine definitely improved since he is stopped losartan. - Mild anemia with no iron deficiency. -No recent amb BPs  Plan: KCl 10 mEq x 10 days then stop Monitor BPs and call me with readings in 2 to 3 days F/u  person in 2 to 3 weeks.

## 2021-03-16 NOTE — Telephone Encounter (Signed)
Error

## 2021-03-22 ENCOUNTER — Other Ambulatory Visit (HOSPITAL_BASED_OUTPATIENT_CLINIC_OR_DEPARTMENT_OTHER): Payer: Self-pay

## 2021-03-22 MED ORDER — PFIZER COVID-19 VAC BIVALENT 30 MCG/0.3ML IM SUSP
INTRAMUSCULAR | 0 refills | Status: DC
  Filled 2021-03-22: qty 0.3, 1d supply, fill #0

## 2021-04-01 ENCOUNTER — Encounter: Payer: Self-pay | Admitting: Internal Medicine

## 2021-04-01 ENCOUNTER — Other Ambulatory Visit: Payer: Self-pay

## 2021-04-01 ENCOUNTER — Ambulatory Visit (INDEPENDENT_AMBULATORY_CARE_PROVIDER_SITE_OTHER): Payer: 59 | Admitting: Internal Medicine

## 2021-04-01 VITALS — BP 152/82 | HR 106 | Temp 97.8°F | Resp 18 | Ht 68.0 in | Wt 169.2 lb

## 2021-04-01 DIAGNOSIS — R197 Diarrhea, unspecified: Secondary | ICD-10-CM

## 2021-04-01 DIAGNOSIS — I10 Essential (primary) hypertension: Secondary | ICD-10-CM

## 2021-04-01 DIAGNOSIS — D649 Anemia, unspecified: Secondary | ICD-10-CM

## 2021-04-01 DIAGNOSIS — R000 Tachycardia, unspecified: Secondary | ICD-10-CM

## 2021-04-01 MED ORDER — METOPROLOL TARTRATE 100 MG PO TABS: 100.0000 mg | ORAL_TABLET | Freq: Two times a day (BID) | ORAL | 1 refills | Status: DC

## 2021-04-01 NOTE — Patient Instructions (Addendum)
Will increase metoprolol to 100 mg tablet: 1 twice daily.  Other medications the same.  Please see your medication list.  Check the  blood pressure 3-4 times a week. BP GOAL is between 110/65 and  135/85. If it is consistently higher or lower, let me know  Get a stool container from the lab   [place] back for blood work this week Come back for a checkup in 1 month

## 2021-04-01 NOTE — Progress Notes (Signed)
Subjective:    Patient ID: Richard Gates, male    DOB: 1960/11/03, 60 y.o.   MRN: [id number]  DOS:  04/01/2021 Type of visit - description: f/u Since the last office visit, his blood work came back abnormal. Chart is reviewed  He also had a UTI with Enterococcus and was treated with amoxicillin  Today, he also reports diarrhea for the last 2 months, he is not completely certain if the diarrhea started shortly before or shortly after he took amoxicillin.  No blood in the stools other than occasional drops of blood when he wipes. No nausea vomiting No abdominal pain  Review of Systems See above   Past Medical History:  Diagnosis Date   COPD (chronic obstructive pulmonary disease) (HCC)    Coronary artery calcification seen on CAT scan 08/2019   Coronary calcium score 103; short LM with<25% mixed calcific plaque (minimal); aortic atherosclerosis with normal size.  No dissection.  No aortic valve calcification   GERD (gastroesophageal reflux disease)    Hyperlipidemia    Hypertension     Past Surgical History:  Procedure Laterality Date   APPENDECTOMY     COLONOSCOPY  08/24/2016   POLYPECTOMY     TRANSTHORACIC ECHOCARDIOGRAM  04/2018   EF 65-70% (vigorous). No RWMA.  Gr 1 DD.  Normal valves.  (In setting of COPD exacerbation)    Allergies as of 04/01/2021   No Known Allergies      Medication List        Accurate as of April 01, 2021  4:37 PM. If you have any questions, ask your nurse or doctor.          STOP taking these medications    amoxicillin 875 MG tablet Commonly known as: AMOXIL Stopped by: Kathlene November, MD   [place] COVID-19 Vac Bivalent injection Generic drug: COVID-19 mRNA bivalent vaccine Therapist, music) Stopped by: Kathlene November, MD   potassium chloride 10 MEQ tablet Commonly known as: KLOR-CON M Stopped by: Kathlene November, MD       TAKE these medications    albuterol 108 (90 Base) MCG/ACT inhaler Commonly known as: VENTOLIN HFA Inhale 2 puffs into  the lungs every 6 (six) hours as needed for wheezing or shortness of breath.   amLODipine 10 MG tablet Commonly known as: NORVASC Take 1 tablet (10 mg total) by mouth daily.   Bevespi Aerosphere 9-4.8 MCG/ACT Aero Generic drug: Glycopyrrolate-Formoterol Inhale 2 puffs into the lungs 2 (two) times daily.   metoprolol tartrate 50 MG tablet Commonly known as: LOPRESSOR Take 1 tablet (50 mg total) by mouth 2 (two) times daily.   pantoprazole 40 MG tablet Commonly known as: PROTONIX Take 1 tablet (40 mg total) by mouth daily.   rosuvastatin 20 MG tablet Commonly known as: CRESTOR Take 1 tablet (20 mg total) by mouth daily.   sildenafil 20 MG tablet Commonly known as: REVATIO Take 3-4 tablets (60-80 mg total) by mouth at bedtime as needed.           Objective:   Physical Exam BP (!) 152/82 (BP Location: Right Arm, Patient Position: Sitting, Cuff Size: Small)   Pulse (!) 106   Temp 97.8 F (36.6 C) (Oral)   Resp 18   Ht 5\' 8"  (1.727 m)   Wt 169 lb 4 oz (76.8 kg)   SpO2 97%   BMI 25.73 kg/m  General:   Well developed, NAD, BMI noted.  HEENT:  Normocephalic . Face symmetric, atraumatic Lungs:  CTA B Normal respiratory  effort, no intercostal retractions, no accessory muscle use. Heart: RRR,  no murmur.  Abdomen:  Not distended, soft, non-tender. No rebound or rigidity.   Skin: Not pale. Not jaundice Lower extremities: no pretibial edema bilaterally  Neurologic:  alert & oriented X3.  Speech normal, gait appropriate for age and unassisted Psych--  Cognition and judgment appear intact.  Cooperative with normal attention span and concentration.  Behavior appropriate. No anxious or depressed appearing.     Assessment     ASSESSMENT Hyperglycemia HTN GERD Hyperlipidemia, high TG Allergies , nasal  COPD, PFTs 2013 showed ratio of 58, FEV1 of 1.78- 52% without significant bronchodilator response, TLC was 77% with DLCO 78% Smoker: quit 2020 Aortic sclerosis  per CT 05-2020 Coronary CTA, Ca+ Co score 103.0, L main artery, saw cards, probably still a low risk situation.  Plan is CV RF,  ASA not warranted at this point.  PLAN: HTN, increased creatinine, decreased potassium: Patient was seen 02/02/2021. He was on amlodipine, metoprolol.  Losartan was added for better control. Had blood work 2 weeks after ----> potassium was 2.7, creatinine 1.6. At that time (unknown to me) he was having diarrhea. Losartan stopped, he got some potassium by mouth. Labs recheck 03/01/2021.  Potassium 3.1, creatinine 1.04. Here for follow-up, at this point he is taking amlodipine, metoprolol 50 mg twice daily. Heart rate noted to be elevated, I asked metoprolol compliance and he is not sure. BP is a still slightly elevated Plan: CMP Increase metoprolol 100 mg twice daily, continue amlodipine. Consult our clinical pharmacist to check compliance Anemia: At the last visit, hemoglobin decreased to 11.5, it was rechecked and it was about the same.  MCV noted to be elevated as well. Ferritin was quite elevated, iron normal Plan: T05, folic acid, recheck CBC and iron panel Diarrhea: Going on for 2 months, the patient is not completely sure if the diarrhea started shortly after or shortly before he took amoxicillin. Last colonoscopy 10/01/2019, multiple polyps, Stools for C. difficile, culture, Hemoccult. UTI: Treated.  No symptoms.  No further eval. RTC 1 month   This visit occurred during the SARS-CoV-2 public health emergency.  Safety protocols were in place, including screening questions prior to the visit, additional usage of staff PPE, and extensive cleaning of exam room while observing appropriate contact time as indicated for disinfecting solutions.

## 2021-04-02 NOTE — Assessment & Plan Note (Signed)
HTN, increased creatinine, decreased potassium: Patient was seen 02/02/2021. He was on amlodipine, metoprolol.  Losartan was added for better control. Had blood work 2 weeks after ----> potassium was 2.7, creatinine 1.6. At that time (unknown to me) he was having diarrhea. Losartan stopped, he got some potassium by mouth. Labs recheck 03/01/2021.  Potassium 3.1, creatinine 1.04. Here for follow-up, at this point he is taking amlodipine, metoprolol 50 mg twice daily. Heart rate noted to be elevated, I asked metoprolol compliance and he is not sure. BP is a still slightly elevated Plan: CMP Increase metoprolol 100 mg twice daily, continue amlodipine. Consult our clinical pharmacist to check compliance Anemia: At the last visit, hemoglobin decreased to 11.5, it was rechecked and it was about the same.  MCV noted to be elevated as well. Ferritin was quite elevated, iron normal Plan: P89, folic acid, recheck CBC and iron panel Diarrhea: Going on for 2 months, the patient is not completely sure if the diarrhea started shortly after or shortly before he took amoxicillin. Last colonoscopy 10/01/2019, multiple polyps, Stools for C. difficile, culture, Hemoccult. UTI: Treated.  No symptoms.  No further eval. RTC 1 month

## 2021-04-04 ENCOUNTER — Other Ambulatory Visit: Payer: Self-pay | Admitting: Internal Medicine

## 2021-04-05 ENCOUNTER — Telehealth: Payer: Self-pay | Admitting: *Deleted

## 2021-04-05 NOTE — Chronic Care Management (AMB) (Signed)
  Care Management   Note  04/05/2021 Name: Richard Gates MRN: [id number] DOB: [birth year]  Richard Gates is a 60 y.o. year old male who is a primary care patient of Colon Branch, MD. I reached out to [name] by phone today in response to a referral sent by Mr. Drako [facility] primary care provider.   Mr. Haubner was given information about care management services today including:  Care management services include personalized support from designated clinical staff supervised by his physician, including individualized plan of care and coordination with other care providers 24/7 contact phone numbers for assistance for urgent and routine care needs. The patient may stop care management services at any time by phone call to the office staff.  Patient agreed to services and verbal consent obtained.   Follow up plan: Telephone appointment with care management team member scheduled for: 04/12/2021  Julian Hy, [place] Management  Direct Dial: [phone]

## 2021-04-05 NOTE — Progress Notes (Signed)
Opened in error

## 2021-04-06 ENCOUNTER — Other Ambulatory Visit (INDEPENDENT_AMBULATORY_CARE_PROVIDER_SITE_OTHER): Payer: 59

## 2021-04-06 DIAGNOSIS — R197 Diarrhea, unspecified: Secondary | ICD-10-CM

## 2021-04-07 LAB — CBC WITH DIFFERENTIAL/PLATELET
Basophils Absolute: 0.1 10*3/uL (ref 0.0–0.1)
Basophils Relative: 1.4 % (ref 0.0–3.0)
Eosinophils Absolute: 0.2 10*3/uL (ref 0.0–0.7)
Eosinophils Relative: 3.6 % (ref 0.0–5.0)
HCT: 35.8 % — ABNORMAL LOW (ref 39.0–52.0)
Hemoglobin: 11.7 g/dL — ABNORMAL LOW (ref 13.0–17.0)
Lymphocytes Relative: 23.5 % (ref 12.0–46.0)
Lymphs Abs: 1.6 10*3/uL (ref 0.7–4.0)
MCHC: 32.7 g/dL (ref 30.0–36.0)
MCV: 105.2 fl — ABNORMAL HIGH (ref 78.0–100.0)
Monocytes Absolute: 0.8 10*3/uL (ref 0.1–1.0)
Monocytes Relative: 12 % (ref 3.0–12.0)
Neutro Abs: 4.1 10*3/uL (ref 1.4–7.7)
Neutrophils Relative %: 59.5 % (ref 43.0–77.0)
Platelets: 345 10*3/uL (ref 150.0–400.0)
RBC: 3.4 Mil/uL — ABNORMAL LOW (ref 4.22–5.81)
RDW: 15.8 % — ABNORMAL HIGH (ref 11.5–15.5)
WBC: 6.9 10*3/uL (ref 4.0–10.5)

## 2021-04-07 LAB — COMPREHENSIVE METABOLIC PANEL
ALT: 35 U/L (ref 0–53)
AST: 47 U/L — ABNORMAL HIGH (ref 0–37)
Albumin: 3.8 g/dL (ref 3.5–5.2)
Alkaline Phosphatase: 151 U/L — ABNORMAL HIGH (ref 39–117)
BUN: 15 mg/dL (ref 6–23)
CO2: 31 mEq/L (ref 19–32)
Calcium: 7.7 mg/dL — ABNORMAL LOW (ref 8.4–10.5)
Chloride: 100 mEq/L (ref 96–112)
Creatinine, Ser: 1.19 mg/dL (ref 0.40–1.50)
GFR: 66.17 mL/min (ref >60.00)
Glucose, Bld: 89 mg/dL (ref 70–99)
Potassium: 3.3 mEq/L — ABNORMAL LOW (ref 3.5–5.1)
Sodium: 144 mEq/L (ref 135–145)
Total Bilirubin: 0.5 mg/dL (ref 0.2–1.2)
Total Protein: 6.9 g/dL (ref 6.0–8.3)

## 2021-04-07 LAB — IBC + FERRITIN
Ferritin: 931 ng/mL — ABNORMAL HIGH (ref 22.0–322.0)
Iron: 69 ug/dL (ref 42–165)
Saturation Ratios: 20.9 % (ref 20.0–50.0)
TIBC: 330.4 ug/dL (ref 250.0–450.0)
Transferrin: 236 mg/dL (ref 212.0–360.0)

## 2021-04-07 LAB — B12 AND FOLATE PANEL
Folate: 12.6 ng/mL (ref >5.9)
Vitamin B-12: 148 pg/mL — ABNORMAL LOW (ref 211–911)

## 2021-04-08 ENCOUNTER — Other Ambulatory Visit (INDEPENDENT_AMBULATORY_CARE_PROVIDER_SITE_OTHER): Payer: 59

## 2021-04-08 ENCOUNTER — Telehealth: Payer: Self-pay | Admitting: Pharmacist

## 2021-04-08 DIAGNOSIS — R197 Diarrhea, unspecified: Secondary | ICD-10-CM | POA: Diagnosis not present

## 2021-04-08 LAB — FECAL OCCULT BLOOD, IMMUNOCHEMICAL: Fecal Occult Bld: NEGATIVE

## 2021-04-08 NOTE — Chronic Care Management (AMB) (Signed)
    Chronic Care Management Pharmacy Assistant   Name: VIBHAV WADDILL  MRN: [id number] DOB: July 06, 1960  MARTRELL EGUIA is an 60 y.o. year old male who presents for his initial CCM visit with the clinical pharmacist.  Recent office visits:  04/01/21-Jose Ladona Horns, MD (PCP) Seen for a general follow up visit. Losartan stopped. Increase metoprolol 100 mg twice daily. Labs ordered. Follow up in 1 month. 02/02/21-Jose Ladona Horns, MD (PCP) General follow up visit. Start losartan 50 mg 1 tablet daily to better control your blood pressure. Start Bevespi 2 puffs twice daily Use albuterol only as needed. Flu shot and tetanus shot given. Follow up in 3 months.  Recent consult visits:  None noted  Hospital visits:  None in previous 6 months  Medications: Outpatient Encounter Medications as of 04/08/2021  Medication Sig   albuterol (VENTOLIN HFA) 108 (90 Base) MCG/ACT inhaler Inhale 2 puffs into the lungs every 6 (six) hours as needed for wheezing or shortness of breath.   amLODipine (NORVASC) 10 MG tablet Take 1 tablet (10 mg total) by mouth daily.   Glycopyrrolate-Formoterol (BEVESPI AEROSPHERE) 9-4.8 MCG/ACT AERO Inhale 2 puffs into the lungs 2 (two) times daily.   metoprolol tartrate (LOPRESSOR) 100 MG tablet Take 1 tablet (100 mg total) by mouth 2 (two) times daily.   pantoprazole (PROTONIX) 40 MG tablet Take 1 tablet (40 mg total) by mouth daily.   rosuvastatin (CRESTOR) 20 MG tablet TAKE 1 TABLET(20 MG) BY MOUTH DAILY   sildenafil (REVATIO) 20 MG tablet Take 3-4 tablets (60-80 mg total) by mouth at bedtime as needed. (Patient not taking: Reported on 05/11/2020)   No facility-administered encounter medications on file as of 04/08/2021.   Albuterol (VENTOLIN HFA) 108 (90 Base) MCG/ACT inhaler Last filled:03/23/21 25 DS AmLODipine (NORVASC) 10 MG tabletLast filled:02/04/21 90 DS Glycopyrrolate-Formoterol (BEVESPI AEROSPHERE) 9-4.8 MCG/ACT AERO Last filled:None noted Metoprolol tartrate (LOPRESSOR)  100 MG tablet Last filled:04/01/21 30 DS Pantoprazole (PROTONIX) 40 MG tablet Last filled:04/04/21 90 DS Rosuvastatin (CRESTOR) 20 MG tablet Last filled:04/05/21 90 DS Sildenafil (REVATIO) 20 MG tablet Last filled:10/30/19 6 DS   Care Gaps: Zoster Vaccines- Shingrix:Never done Pneumococcal Vaccine:Overdue since 04/11/2020  Star Rating Drugs: Rosuvastatin (CRESTOR) 20 MG tablet Last filled:04/05/21 90 DS  Myriam Elta [place], [place]

## 2021-04-11 ENCOUNTER — Telehealth: Payer: Self-pay | Admitting: Internal Medicine

## 2021-04-11 DIAGNOSIS — E876 Hypokalemia: Secondary | ICD-10-CM

## 2021-04-11 DIAGNOSIS — I10 Essential (primary) hypertension: Secondary | ICD-10-CM

## 2021-04-11 DIAGNOSIS — R7989 Other specified abnormal findings of blood chemistry: Secondary | ICD-10-CM

## 2021-04-11 NOTE — Telephone Encounter (Addendum)
-  Endocrinology referral Dx: HTN, low potassium, r/o hyperaldosteronism  -Arrange ultrasound abdomen: He has increased LFTs -Start B12 injection weekly x4 then monthly ====== Potassium continues to be low despite not taking any potassium altering  medication. Mild anemia, increased ferritin, macrocytosis, B12 deficiency: Reassess after B12 injections Increased alkaline phosphatase, checking ultrasound, consider further eval in the future.  All of the above discussed with the patient

## 2021-04-11 NOTE — Telephone Encounter (Incomplete Revision)
-  Endocrinology referral Dx: HTN, low potassium, r/o hyperaldosteronism  -Arrange ultrasound abdomen: He has increased LFTs -Start B12 injection weekly x4 then monthly ====== Potassium continues to be low despite not taking any potassium after medication. Mild anemia, increased ferritin, macrocytosis, B12 deficiency: Reassess after B12 injections Increased alkaline phosphatase, checking ultrasound, consider further eval in the future.  All of the above discussed with the patient

## 2021-04-12 ENCOUNTER — Ambulatory Visit: Payer: 59 | Admitting: Pharmacist

## 2021-04-12 ENCOUNTER — Other Ambulatory Visit: Payer: 59

## 2021-04-12 DIAGNOSIS — R197 Diarrhea, unspecified: Secondary | ICD-10-CM

## 2021-04-12 DIAGNOSIS — I10 Essential (primary) hypertension: Secondary | ICD-10-CM

## 2021-04-12 DIAGNOSIS — R7989 Other specified abnormal findings of blood chemistry: Secondary | ICD-10-CM

## 2021-04-12 DIAGNOSIS — E785 Hyperlipidemia, unspecified: Secondary | ICD-10-CM

## 2021-04-12 DIAGNOSIS — J449 Chronic obstructive pulmonary disease, unspecified: Secondary | ICD-10-CM

## 2021-04-12 NOTE — Telephone Encounter (Signed)
Spoke w/ Pt- scheduled nurse visit for B12 injection. Endo referral placed, [place] RUQ abd ordered.

## 2021-04-13 ENCOUNTER — Other Ambulatory Visit: Payer: Self-pay | Admitting: Internal Medicine

## 2021-04-13 ENCOUNTER — Other Ambulatory Visit: Payer: Self-pay

## 2021-04-13 ENCOUNTER — Ambulatory Visit (HOSPITAL_BASED_OUTPATIENT_CLINIC_OR_DEPARTMENT_OTHER)
Admission: RE | Admit: 2021-04-13 | Discharge: 2021-04-13 | Disposition: A | Discharge status: Home / Self Care | Payer: 59 | Source: Ambulatory Visit | Attending: Internal Medicine | Admitting: Internal Medicine

## 2021-04-13 DIAGNOSIS — R7989 Other specified abnormal findings of blood chemistry: Secondary | ICD-10-CM | POA: Diagnosis present

## 2021-04-13 MED ORDER — ATORVASTATIN CALCIUM 80 MG PO TABS: 80.0000 mg | ORAL_TABLET | Freq: Every day | ORAL | 5 refills | Status: DC

## 2021-04-13 NOTE — Chronic Care Management (AMB) (Signed)
Care Management   Pharmacy Note  04/13/2021 Name: Richard Gates MRN: [id number] DOB: [birth year]  Subjective: Richard Gates is a 60 y.o. year old male who is a primary care patient of Colon Branch, MD. The Care Management team was consulted for assistance with care management and care coordination needs.    Engaged with patient by telephone for initial visit in response to provider referral for pharmacy case management and/or care coordination services.   The patient was given information about Care Management services today including:  Care Management services includes personalized support from designated clinical staff supervised by the patient's primary care provider, including individualized plan of care and coordination with other care providers. 24/7 contact phone numbers for assistance for urgent and routine care needs. The patient may stop case management services at any time by phone call to the office staff.  Patient agreed to services and consent obtained.  Assessment:  Review of patient status, including review of consultants reports, laboratory and other test data, was performed as part of comprehensive evaluation and provision of chronic care management services.   SDOH (Social Determinants of Health) assessments and interventions performed:  SDOH Interventions    Flowsheet Row Most Recent Value  SDOH Interventions   Financial Strain Interventions Other (Comment)  [reviewed patient's [place] and started application for [employer] PAP]        Objective:  Lab Results  Component Value Date   CREATININE 1.19 04/06/2021   CREATININE 1.04 03/01/2021   CREATININE 1.64 (H) 02/16/2021    Lab Results  Component Value Date   HGBA1C 5.8 02/16/2021       Component Value Date/Time   CHOL 182 09/02/2020 1623   TRIG 387.0 (H) 09/02/2020 1623   HDL 80.00 09/02/2020 1623   CHOLHDL 2 09/02/2020 1623   VLDL 77.4 (H) 09/02/2020 1623   LDLCALC 68  04/12/2019 1553   LDLDIRECT 49.0 09/02/2020 1623    Other: (TSH, CBC, Vit D, etc.)  Clinical ASCVD: No  The 10-year ASCVD risk score (Arnett DK, et al., 2019) is: 16.1%   Values used to calculate the score:     Age: 39 years     Sex: Male     Is Non-Hispanic African American: Yes     Diabetic: No     Tobacco smoker: No     Systolic Blood Pressure: 790 mmHg     Is BP treated: Yes     HDL Cholesterol: 80 mg/dL     Total Cholesterol: 182 mg/dL    Other: (CHADS2VASc if Afib, PHQ9 if depression, MMRC or CAT for COPD, ACT, DEXA)  BP Readings from Last 3 Encounters:  04/01/21 (!) 152/82  02/02/21 (!) 172/100  09/02/20 (!) 155/77    Care Plan  No Known Allergies  Medications Reviewed Today     Reviewed by Cherre Robins, RPH-CPP (Pharmacist) on 04/12/21 at [place] List Status: <None>   Medication Order Taking? Sig Documenting Provider Last Dose Status Informant  albuterol (VENTOLIN HFA) 108 (90 Base) MCG/ACT inhaler [id number] Yes Inhale 2 puffs into the lungs every 6 (six) hours as needed for wheezing or shortness of breath. Colon Branch, MD Taking Active   amLODipine (NORVASC) 10 MG tablet [id number] Yes Take 1 tablet (10 mg total) by mouth daily. Colon Branch, MD Taking Active   aspirin EC 81 MG tablet [id number] Yes Take 81 mg by mouth daily. Swallow whole. [provider] Taking Active   Glycopyrrolate-Formoterol (BEVESPI AEROSPHERE) 9-4.8  MCG/ACT AERO [id number] No Inhale 2 puffs into the lungs 2 (two) times daily.  Patient not taking: Reported on 04/12/2021   Colon Branch, MD Not Taking Active   metoprolol tartrate (LOPRESSOR) 100 MG tablet [id number] Yes Take 1 tablet (100 mg total) by mouth 2 (two) times daily. Colon Branch, MD Taking Active   pantoprazole (PROTONIX) 40 MG tablet [id number] Yes Take 1 tablet (40 mg total) by mouth daily. Colon Branch, MD Taking Active   rosuvastatin (CRESTOR) 20 MG tablet [id number] No TAKE 1 TABLET(20 MG) BY MOUTH DAILY  Patient not  taking: Reported on 04/12/2021   Colon Branch, MD Not Taking Active   sildenafil (REVATIO) 20 MG tablet [id number]  Take 3-4 tablets (60-80 mg total) by mouth at bedtime as needed.  Patient not taking: Reported on 05/11/2020   Colon Branch, MD  Active             Patient Active Problem List   Diagnosis Date Noted   Erectile dysfunction 09/07/2018   Hyperlipidemia with target LDL less than 70 08/17/2018   Left main coronary artery disease 06/15/2018   Coronary artery calcification seen on computed tomography 06/15/2018   DOE (dyspnea on exertion) 06/15/2018   Acute respiratory failure with hypoxia ([place]) 04/16/2018   Hypertension    Elevated troponin I level    COPD mixed type ([place]) 07/19/2016   PCP NOTES >>>>>>>>>>>>>>> 06/23/2016   Tobacco abuse 02/07/2012   Anxiety and depression 02/07/2012   Tachycardia 01/03/2012   Annual physical exam 05/12/2011    Conditions to be addressed/monitored: HTN, HLD, Hypertriglyceridemia, COPD, and Low B12; elevated LFTs; hypokalemia  Care Plan : General Pharmacy (Adult)  Updates made by Cherre Robins, RPH-CPP since 04/13/2021 12:00 AM     Problem: HTN; hyperdlipidemia; Low B12; Elevated LFTs; hypokalemia; COPD   Priority: High  Onset Date: 04/12/2021     Long-Range Goal: Provide education, support and care coordination for medication therapy and chronic conditions   Start Date: 04/12/2021  Priority: High  Note:   Current Barriers:  Unable to independently afford treatment regimen Unable to achieve control of hypertension or hyperlipidemia   Does not adhere to prescribed medication regimen  Pharmacist Clinical Goal(s):  Over the next 90 days, patient will verbalize ability to afford treatment regimen achieve control of hyperlipidemia and hypertension  as evidenced by LDL < 100 and Tg < 150 and blood pressure <140/90 adhere to prescribed medication regimen as evidenced by refill history  through collaboration with PharmD and provider.    Interventions: 1:1 collaboration with Colon Branch, MD regarding development and update of comprehensive plan of care as evidenced by provider attestation and co-signature Inter-disciplinary care team collaboration (see longitudinal plan of care) Comprehensive medication review performed; medication list updated in electronic medical record  Hypertension: Uncontrolled Current treatment: Amlodipine 10mg  daily  Metoprolol 100mg  twice a day  Current home readings: none available Denies hypotensive/hypertensive symptoms Interventions:  Education provided on blood pressure goal and importance of blood pressure control in prevention of cardiovascular and kidney disease Reviewed adherence  Amlodipine filled 90 days 06/01/2020; 08/08/2020; 11/11/2020 ad 02/04/2021   Metoprolol 50mg  90 days 08/08/2020 and metoprolol 100mg  30 days filled 04/01/2021 Discussed importance of adherence of to blood pressure meds Reviewed [place] - both amlodipine and metoprolol are on zero copay list.   Hyperlipidemia: Uncontrolled; LDL goal <100 and triglycerides <150 Current treatment: Rosuvastatin 20mg  daily  Patient recently had elevated AST and alk. Phos.; abdominal ultrasound  has been ordered. Suspect might show fatty liver since patient has not filled and picked up rosuvastatin since 09/02/2020.  Interventions:  Education provided on lipids goals Reviewed adherence:  Rosuvastatin filled 04/05/2021 but has not been picked up yet per [facility] - cost is $49.94 for 90 days. If patient gets just 30 days, pharmacy states cost would be $7.43.  Reviewed [facility] zero copay list. The following statins are available for 90 days zero atorvastatin 10 to 40mg , simvastatin, pravastatin and lovastatin. Atorvastatin 80mg  is also zero copay but only has a quantity limit of 30 days  Recommend change rosuvastatin to atorvastatin 80mg  daily  - checking with PCP to see if change is OK.  Chronic Obstructive  Pulmonary Disease: Uncontrolled; goal: decrease use of rescue inhaler / assist with cost of Bevespi Current treatment: Bevespi Inhaler - inhale 2 puffs into lungs twice a day Albuterol inhaler - inhale 2 puffs every 6 hours as needed for wheezing or shortness of breath No exacerbations requiring treatment in the last 6 months  Patient is not currently using Bevespi due to cost (patient has high deductible ACO plan and cost is $400) Rescue inhaler use: patient is using daily - filled 03/23/2021; 02/25/2021; 02/01/2021; 01/11/2021; 12/27/2020; 12/01/2020; 11/11/2020 Interventions:  Education provided on maintenance versus rescue therapy Started application process for Bevespi inhaler [place]. Mailed to patient. He will complete and return to our office.   Patient Goals/Self-Care Activities Over the next 90 days, patient will:  take medications as prescribed, focus on medication adherence by filling prescriptions on time, and collaborate with provider on medication access solutions  Follow Up Plan: Telephone follow up appointment with care management team member scheduled for:  2 to 3 weeks         Medication Assistance:  [place]. in process.  Anticipated assistance start date 05/02/2021.  See plan of care for additional detail.  Follow Up:  Patient agrees to Care Plan and Follow-up.  Plan: Telephone follow up appointment with care management team member scheduled for:  2 to 3 weeks  Cherre Robins, PharmD Clinical Pharmacist [facility]

## 2021-04-13 NOTE — Patient Instructions (Signed)
Hypertension: Blood pressure goal <140/90 BP Readings from Last 3 Encounters:  04/01/21 (!) 152/82  02/02/21 (!) 172/100  09/02/20 (!) 155/77   Current treatment: Amlodipine 10mg  daily  Metoprolol 100mg  twice a day  Interventions:  Education provided on blood pressure goal and importance of blood pressure control in prevention of cardiovascular and kidney disease Reviewed [place] - both amlodipine and metoprolol are on zero copay list.  Continue to take current blood pressure regimen  Hyperlipidemia: Uncontrolled; LDL goal <100 and triglycerides <150 Lab Results  Component Value Date   CHOL 182 09/02/2020   CHOL 230 (H) 06/01/2020   CHOL 171 04/12/2019   Lab Results  Component Value Date   HDL 80.00 09/02/2020   HDL 72.60 06/01/2020   HDL 76 04/12/2019    Lab Results  Component Value Date   TRIG 387.0 (H) 09/02/2020   TRIG (H) 06/01/2020    568.0 Triglyceride is over 400; calculations on Lipids are invalid.   TRIG 200 (H) 04/12/2019    Lab Results  Component Value Date   LDLDIRECT 49.0 09/02/2020   LDLDIRECT 84.0 06/01/2020   LDLDIRECT 108.0 04/03/2018    Current treatment: Rosuvastatin 20mg  daily   Interventions:  Education provided on lipids goals Rosuvastatin cost is $49.94 for 90 days. If patient gets just 30 days, pharmacy states cost would be $7.43.  Reviewed [facility] zero copay list. The following statins are available for 90 days zero atorvastatin 10 to 40mg , simvastatin, pravastatin and lovastatin. Atorvastatin 80mg  is also zero copay but only has a quantity limit of 30 days  Recommend change rosuvastatin to atorvastatin 80mg  daily  Chronic Obstructive Pulmonary Disease: Uncontrolled; goal: decrease use of rescue inhaler / assist with cost of Bevespi Current treatment: Bevespi Inhaler - inhale 2 puffs into lungs twice a day  Albuterol inhaler - inhale 2 puffs every 6 hours as needed for wheezing or shortness of  breath Interventions:  Education provided on maintenance versus rescue therapy Started application process for Bevespi inhaler [place]. Mailed to patient. Complete and return to our office.   Patient Goals/Self-Care Activities Over the next 90 days, patient will:  take medications as prescribed, focus on medication adherence by filling prescriptions on time, and collaborate with provider on medication access solutions  Follow Up Plan: Telephone follow up appointment with care management team member scheduled for:  2 to 3 weeks

## 2021-04-15 ENCOUNTER — Ambulatory Visit (INDEPENDENT_AMBULATORY_CARE_PROVIDER_SITE_OTHER): Payer: 59

## 2021-04-15 DIAGNOSIS — E538 Deficiency of other specified B group vitamins: Secondary | ICD-10-CM | POA: Diagnosis not present

## 2021-04-15 LAB — CLOSTRIDIUM DIFFICILE TOXIN B, QUALITATIVE, REAL-TIME PCR: Toxigenic C. Difficile by PCR: NOT DETECTED

## 2021-04-15 MED ORDER — CYANOCOBALAMIN 1000 MCG/ML IJ SOLN
1000.0000 ug | Freq: Once | INTRAMUSCULAR | Status: AC
  Administered 2021-04-15: 1000 ug via INTRAMUSCULAR

## 2021-04-15 NOTE — Patient Instructions (Signed)
Nurse visit first one out of four.  Next injection is 04/22/21.     Vitamin B12 Deficiency Vitamin B12 deficiency means that your body does not have enough vitamin B12. The body needs this important vitamin: To make red blood cells. To make genes (DNA). To help the nerves work. If you do not have enough vitamin B12 in your body, you can have health problems, such as not having enough red blood cells in the blood (anemia). What are the causes? Not eating enough foods that contain vitamin B12. Not being able to take in (absorb) vitamin B12 from the food that you eat. Certain diseases. A condition in which the body does not make enough of a certain protein. This results in your body not taking in enough vitamin B12. Having a surgery in which part of the stomach or small intestine is taken out. Taking medicines that make it hard for the body to take in vitamin B12. These include: Heartburn medicines. Some medicines that are used to treat diabetes. What increases the risk? Being an older adult. Eating a vegetarian or vegan diet that does not include any foods that come from animals. Not eating enough foods that contain vitamin B12 while you are pregnant. Taking certain medicines. Having alcoholism. What are the signs or symptoms? In some cases, there are no symptoms. If the condition leads to too few blood cells or nerve damage, symptoms can occur, such as: Feeling weak or tired. Not being hungry. Losing feeling (numbness) or tingling in your hands and feet. Redness and burning of the tongue. Feeling sad (depressed). Confusion or memory problems. Trouble walking. If anemia is very bad, symptoms can include: Being short of breath. Being dizzy. Having a very fast heartbeat. How is this treated? Changing the way you eat and drink, such as: Eating more foods that contain vitamin B12. Drinking little or no alcohol. Getting vitamin B12 shots. Taking vitamin B12 supplements by mouth  (orally). Your doctor will tell you the dose that is best for you. Follow these instructions at home: Eating and drinking Examples of foods that are good sources of vitamin B12.  Eat foods that come from animals and have a lot of vitamin B12 in them. These include: Meats and poultry. This includes beef, pork, chicken, [place], and organ meats, such as liver. Seafood, such as clams, rainbow trout, salmon, tuna, and haddock. Eggs. Dairy foods such as milk, yogurt, and cheese. Eat breakfast cereals that have vitamin B12 added to them (are fortified). Check the label. The items listed above may not be a complete list of foods and beverages you can eat and drink. Contact a dietitian for more information. Alcohol use Do not drink alcohol if: Your doctor tells you not to drink. You are pregnant, may be pregnant, or are planning to become pregnant. If you drink alcohol: Limit how much you have to: 0-1 drink a day for women. 0-2 drinks a day for men. Know how much alcohol is in your drink. In the [place] one drink equals one 12 oz bottle of beer (355 mL), one 5 oz glass of wine (148 mL), or one 1 oz glass of hard liquor (44 mL). General instructions Get any vitamin B12 shots if told by your doctor. Take supplements only as told by your doctor. Follow the directions. Keep all follow-up visits. Contact a doctor if: Your symptoms come back. Your symptoms get worse or do not get better with treatment. Get help right away if: You have trouble breathing. You  have a very fast heartbeat. You have chest pain. You get dizzy. You faint. These symptoms may be an emergency. Get help right away. Call 911. Do not wait to see if the symptoms will go away. Do not drive yourself to the hospital. Summary Vitamin B12 deficiency means that your body is not getting enough of the vitamin. In some cases, there are no symptoms of this condition. Treatment may include making a change in the way you eat and drink,  getting shots, or taking supplements. Eat foods that have vitamin B12 in them. This information is not intended to replace advice given to you by your health care provider. Make sure you discuss any questions you have with your health care provider. Document Revised: 12/11/2020 Document Reviewed: 12/11/2020 Elsevier Patient Education  2022 [employer].

## 2021-04-15 NOTE — Progress Notes (Signed)
Pt here for B12 injection per Dr. Larose Kells "B12 injection weekly x4 then monthly" ======  First B12 out of 4 today.  B12 1036mcg given IM on LD, and pt tolerated injection well.  Next B12 injection scheduled for Thursday at 330p on 04/22/21.

## 2021-04-22 ENCOUNTER — Ambulatory Visit (INDEPENDENT_AMBULATORY_CARE_PROVIDER_SITE_OTHER): Payer: 59 | Admitting: *Deleted

## 2021-04-22 DIAGNOSIS — E538 Deficiency of other specified B group vitamins: Secondary | ICD-10-CM

## 2021-04-22 NOTE — Progress Notes (Signed)
Pt here for B12 injection per Dr. Larose Kells "B12 injection weekly x4 then monthly"  Today is the 2nd of 4 weekly injection.   B12 1075mcg given IM on right, and pt tolerated injection well.   Next B12 injection scheduled for 04/29/21.

## 2021-04-23 ENCOUNTER — Ambulatory Visit: Payer: 59 | Admitting: Pharmacist

## 2021-04-23 DIAGNOSIS — I10 Essential (primary) hypertension: Secondary | ICD-10-CM

## 2021-04-23 DIAGNOSIS — R7989 Other specified abnormal findings of blood chemistry: Secondary | ICD-10-CM

## 2021-04-23 DIAGNOSIS — E785 Hyperlipidemia, unspecified: Secondary | ICD-10-CM

## 2021-04-23 MED ORDER — CYANOCOBALAMIN 1000 MCG/ML IJ SOLN
1000.0000 ug | Freq: Once | INTRAMUSCULAR | Status: AC
  Administered 2021-04-22: 16:00:00 1000 ug via INTRAMUSCULAR

## 2021-04-23 NOTE — Patient Instructions (Signed)
°  Hypertension: Blood pressure goal <140/90 BP Readings from Last 3 Encounters:  04/01/21 (!) 152/82  02/02/21 (!) 172/100  09/02/20 (!) 155/77   Current treatment: Amlodipine 10mg  daily  Metoprolol 100mg  twice a day  Interventions:  Education provided on blood pressure goal and importance of blood pressure control in prevention of cardiovascular and kidney disease Reviewed [place] - both amlodipine and metoprolol are on zero copay list. Patient will have [employer] in 2023 and will check formulary in 2023.  Continue to take current blood pressure regimen  Hyperlipidemia: Uncontrolled; LDL goal <100 and triglycerides <150 Lab Results  Component Value Date   CHOL 182 09/02/2020   CHOL 230 (H) 06/01/2020   CHOL 171 04/12/2019   Lab Results  Component Value Date   HDL 80.00 09/02/2020   HDL 72.60 06/01/2020   HDL 76 04/12/2019    Lab Results  Component Value Date   TRIG 387.0 (H) 09/02/2020   TRIG (H) 06/01/2020    568.0 Triglyceride is over 400; calculations on Lipids are invalid.   TRIG 200 (H) 04/12/2019    Lab Results  Component Value Date   LDLDIRECT 49.0 09/02/2020   LDLDIRECT 84.0 06/01/2020   LDLDIRECT 108.0 04/03/2018    Current treatment: Rosuvastatin 20mg  daily   Interventions:  Education provided on lipids goals Continue atorvastatin 80mg  daily  Chronic Obstructive Pulmonary Disease: Uncontrolled; goal: decrease use of rescue inhaler / assist with cost of Bevespi Current treatment: Bevespi Inhaler - inhale 2 puffs into lungs twice a day  Albuterol inhaler - inhale 2 puffs every 6 hours as needed for wheezing or shortness of breath Interventions:  Education provided on maintenance versus rescue therapy Started application process for Bevespi inhaler [place]. Patient completed. Completed provider portion today. Dr Larose Kells reviewed and signed. Application faxed to [facility] and Me patient assistance program. Expect to  received communication about decision in the nest 7 to 10 business days (which might be in 2023)    Patient Goals/Self-Care Activities Over the next 90 days, patient will:  take medications as prescribed, focus on medication adherence by filling prescriptions on time, and collaborate with provider on medication access solutions  Follow Up Plan: Telephone follow up appointment with care management team member scheduled for:  2 to 3 weeks

## 2021-04-23 NOTE — Chronic Care Management (AMB) (Signed)
Care Management   Pharmacy Note  04/23/2021 Name: Richard Gates MRN: [id number] DOB: 1960-12-11  Subjective: Richard Gates is a 60 y.o. year old male who is a primary care patient of Colon Branch, MD. The Care Management team was consulted for assistance with care management and care coordination needs.    Engaged with patient by telephone for follow up visit in response to provider referral for pharmacy case management and/or care coordination services.   The patient was given information about Care Management services today including:  Care Management services includes personalized support from designated clinical staff supervised by the patient's primary care provider, including individualized plan of care and coordination with other care providers. 24/7 contact phone numbers for assistance for urgent and routine care needs. The patient may stop case management services at any time by phone call to the office staff.  Patient agreed to services and consent obtained.  Assessment:  Review of patient status, including review of consultants reports, laboratory and other test data, was performed as part of comprehensive evaluation and provision of chronic care management services.   SDOH (Social Determinants of Health) assessments and interventions performed:      Objective:  Lab Results  Component Value Date   CREATININE 1.19 04/06/2021   CREATININE 1.04 03/01/2021   CREATININE 1.64 (H) 02/16/2021    Lab Results  Component Value Date   HGBA1C 5.8 02/16/2021       Component Value Date/Time   CHOL 182 09/02/2020 1623   TRIG 387.0 (H) 09/02/2020 1623   HDL 80.00 09/02/2020 1623   CHOLHDL 2 09/02/2020 1623   VLDL 77.4 (H) 09/02/2020 1623   LDLCALC 68 04/12/2019 1553   LDLDIRECT 49.0 09/02/2020 1623    Other: (TSH, CBC, Vit D, etc.)  Clinical ASCVD: No  The 10-year ASCVD risk score (Arnett DK, et al., 2019) is: 16.1%   Values used to calculate the score:     Age:  50 years     Sex: Male     Is Non-Hispanic African American: Yes     Diabetic: No     Tobacco smoker: No     Systolic Blood Pressure: 336 mmHg     Is BP treated: Yes     HDL Cholesterol: 80 mg/dL     Total Cholesterol: 182 mg/dL    Other: (CHADS2VASc if Afib, PHQ9 if depression, MMRC or CAT for COPD, ACT, DEXA)  BP Readings from Last 3 Encounters:  04/01/21 (!) 152/82  02/02/21 (!) 172/100  09/02/20 (!) 155/77    Care Plan  No Known Allergies  Medications Reviewed Today     Reviewed by Cherre Robins, RPH-CPP (Pharmacist) on 04/23/21 at [place] List Status: <None>   Medication Order Taking? Sig Documenting Provider Last Dose Status Informant  albuterol (VENTOLIN HFA) 108 (90 Base) MCG/ACT inhaler [id number] Yes INHALE 2 PUFFS INTO THE LUNGS EVERY 6 HOURS AS NEEDED FOR WHEEZING OR SHORTNESS OF BREATH Colon Branch, MD Taking Active   amLODipine (NORVASC) 10 MG tablet [id number] Yes Take 1 tablet (10 mg total) by mouth daily. Colon Branch, MD Taking Active   aspirin EC 81 MG tablet [id number] Yes Take 81 mg by mouth daily. Swallow whole. [provider] Taking Active   atorvastatin (LIPITOR) 80 MG tablet [id number] Yes Take 1 tablet (80 mg total) by mouth daily. Colon Branch, MD Taking Active   Glycopyrrolate-Formoterol (BEVESPI AEROSPHERE) 9-4.8 MCG/ACT Hollie Salk [id number] No Inhale 2 puffs into the lungs  2 (two) times daily.  Patient not taking: Reported on 04/23/2021   Colon Branch, MD Not Taking Active   metoprolol tartrate (LOPRESSOR) 100 MG tablet [id number] Yes Take 1 tablet (100 mg total) by mouth 2 (two) times daily. Colon Branch, MD Taking Active   pantoprazole (PROTONIX) 40 MG tablet [id number] Yes Take 1 tablet (40 mg total) by mouth daily. Colon Branch, MD Taking Active   sildenafil (REVATIO) 20 MG tablet [id number] No Take 3-4 tablets (60-80 mg total) by mouth at bedtime as needed.  Patient not taking: Reported on 04/23/2021   Colon Branch, MD Not Taking Active              Patient Active Problem List   Diagnosis Date Noted   Erectile dysfunction 09/07/2018   Hyperlipidemia with target LDL less than 70 08/17/2018   Left main coronary artery disease 06/15/2018   Coronary artery calcification seen on computed tomography 06/15/2018   DOE (dyspnea on exertion) 06/15/2018   Acute respiratory failure with hypoxia ([place]) 04/16/2018   Hypertension    Elevated troponin I level    COPD mixed type ([place]) 07/19/2016   PCP NOTES >>>>>>>>>>>>>>> 06/23/2016   Tobacco abuse 02/07/2012   Anxiety and depression 02/07/2012   Tachycardia 01/03/2012   Annual physical exam 05/12/2011    Conditions to be addressed/monitored: HTN, HLD, Hypertriglyceridemia, COPD, and Low B12; elevated LFTs; hypokalemia  Care Plan : General Pharmacy (Adult)  Updates made by Cherre Robins, RPH-CPP since 04/23/2021 12:00 AM     Problem: HTN; hyperdlipidemia; Low B12; Elevated LFTs; hypokalemia; COPD   Priority: High  Onset Date: 04/12/2021     Long-Range Goal: Provide education, support and care coordination for medication therapy and chronic conditions   Start Date: 04/12/2021  Priority: High  Note:   Current Barriers:  Unable to independently afford treatment regimen Unable to achieve control of hypertension or hyperlipidemia   Does not adhere to prescribed medication regimen  Pharmacist Clinical Goal(s):  Over the next 90 days, patient will verbalize ability to afford treatment regimen achieve control of hyperlipidemia and hypertension  as evidenced by LDL < 100 and Tg < 150 and blood pressure <140/90 adhere to prescribed medication regimen as evidenced by refill history  through collaboration with PharmD and provider.   Interventions: 1:1 collaboration with Colon Branch, MD regarding development and update of comprehensive plan of care as evidenced by provider attestation and co-signature Inter-disciplinary care team collaboration (see longitudinal plan of care) Comprehensive  medication review performed; medication list updated in electronic medical record  Hypertension: Uncontrolled Current treatment: Amlodipine 15m daily  Metoprolol 1070mtwice a day  Current home readings: patient did not have exact numbers but reports have been 140's / 70's most of the time Denies hypotensive/hypertensive symptoms Interventions:  Education provided on blood pressure goal and importance of blood pressure control in prevention of cardiovascular and kidney disease Reviewed adherence  Amlodipine filled 90 days 06/01/2020; 08/08/2020; 11/11/2020 ad 02/04/2021   Metoprolol 5031m0 days 08/08/2020 and metoprolol 100m50m days filled 04/01/2021 Discussed importance of adherence of to blood pressure meds Recommended patient try to refill metoprolol next week  before new insurance benefits start because [place] [employer] [place].   Hyperlipidemia: Uncontrolled; LDL goal <100 and triglycerides <150 Current treatment: Atorvastatin 80mg72mly (changed at last visit due to being $0 on his health plan) Patient recently had elevated AST and alk. Phos.;  abdominal ultrasound has been ordered. Suspect might show fatty liver since patient has not filled and picked up rosuvastatin since 09/02/2020. Rosuvastatin changed to atorvastatin at last visit due to lower cost.  Tolerating atorvastatin with no myalgias reported  Interventions:  Education provided on lipids goals  Continue to take atorvastatin 41m daily   Chronic Obstructive Pulmonary Disease: Uncontrolled; goal: decrease use of rescue inhaler / assist with cost of Bevespi Current treatment: Bevespi Inhaler - inhale 2 puffs into lungs twice a day Albuterol inhaler - inhale 2 puffs every 6 hours as needed for wheezing or shortness of breath No exacerbations requiring treatment in the last 6 months  Patient is not currently using Bevespi due to cost (patient has high deductible  ACO plan and cost is $400) Rescue inhaler use: patient is using daily - filled 03/23/2021; 02/25/2021; 02/01/2021; 01/11/2021; 12/27/2020; 12/01/2020; 11/11/2020 Interventions:  Education provided on maintenance versus rescue therapy Started application process for Bevespi inhaler [place]. Patient completed. Completed provider portion today. Dr PLarose Kellsreviewed and signed. [facility]. Expect to received communication about decision in the nest 7 to 10 business days (which might be in 2023)   Patient Goals/Self-Care Activities Over the next 90 days, patient will:  take medications as prescribed, focus on medication adherence by filling prescriptions on time, and collaborate with provider on medication access solutions  Follow Up Plan: Telephone follow up appointment with care management team member scheduled for:  2 to 3 weeks         Medication Assistance:  [place]ication [place]. in process.  Anticipated assistance start date 05/05/2021.  See plan of care for additional detail.  Follow Up:  Patient agrees to Care Plan and Follow-up.  Plan: Telephone follow up appointment with care management team member scheduled for:  2 to 3 weeks  TCherre Robins PharmD Clinical Pharmacist [facility] CenterPrimary Care SW MNew CarlisleHHealth[facility]

## 2021-04-29 ENCOUNTER — Ambulatory Visit (INDEPENDENT_AMBULATORY_CARE_PROVIDER_SITE_OTHER): Payer: 59

## 2021-04-29 DIAGNOSIS — E538 Deficiency of other specified B group vitamins: Secondary | ICD-10-CM | POA: Diagnosis not present

## 2021-04-29 MED ORDER — CYANOCOBALAMIN 1000 MCG/ML IJ SOLN
1000.0000 ug | Freq: Once | INTRAMUSCULAR | Status: AC
  Administered 2021-04-29: 16:00:00 1000 ug via INTRAMUSCULAR

## 2021-04-29 NOTE — Progress Notes (Signed)
Pt here for B12 injection per Dr. Larose Kells    Today is the 3rd of 4 weekly injection B12 injection.   B12 1030mcg given IM on left deltoid, and pt tolerated injection well.   Next B12 injection scheduled for 05/06/21

## 2021-05-05 ENCOUNTER — Ambulatory Visit (INDEPENDENT_AMBULATORY_CARE_PROVIDER_SITE_OTHER): Payer: Managed Care, Other (non HMO) | Admitting: Internal Medicine

## 2021-05-05 ENCOUNTER — Encounter: Payer: Self-pay | Admitting: Internal Medicine

## 2021-05-05 ENCOUNTER — Ambulatory Visit: Payer: 59 | Admitting: Internal Medicine

## 2021-05-05 VITALS — BP 126/68 | HR 91 | Temp 98.3°F | Resp 16 | Ht 68.0 in | Wt 169.5 lb

## 2021-05-05 DIAGNOSIS — E538 Deficiency of other specified B group vitamins: Secondary | ICD-10-CM

## 2021-05-05 DIAGNOSIS — K591 Functional diarrhea: Secondary | ICD-10-CM

## 2021-05-05 DIAGNOSIS — D649 Anemia, unspecified: Secondary | ICD-10-CM

## 2021-05-05 DIAGNOSIS — R7989 Other specified abnormal findings of blood chemistry: Secondary | ICD-10-CM

## 2021-05-05 DIAGNOSIS — I10 Essential (primary) hypertension: Secondary | ICD-10-CM | POA: Diagnosis not present

## 2021-05-05 MED ORDER — CYANOCOBALAMIN 1000 MCG/ML IJ SOLN
1000.0000 ug | Freq: Once | INTRAMUSCULAR | Status: AC
  Administered 2021-05-05: 1000 ug via INTRAMUSCULAR

## 2021-05-05 NOTE — Progress Notes (Signed)
Subjective:    Patient ID: Richard Gates, male    DOB: 17-Oct-1960, 61 y.o.   MRN: [id number]  DOS:  05/05/2021 Type of visit - description: Follow-up  Since the last office visit, he is doing about the same. Still wheezing. Still having diarrhea without fever chills or blood in the stools. Some weight loss noted. Ambulatory BPs are very good in the 130/60.  Wt Readings from Last 3 Encounters:  05/05/21 169 lb 8 oz (76.9 kg)  04/01/21 169 lb 4 oz (76.8 kg)  02/02/21 174 lb 8 oz (79.2 kg)     Review of Systems See above   Past Medical History:  Diagnosis Date   COPD (chronic obstructive pulmonary disease) ([place])    Coronary artery calcification seen on CAT scan 08/2019   Coronary calcium score 103; short LM with<25% mixed calcific plaque (minimal); aortic atherosclerosis with normal size.  No dissection.  No aortic valve calcification   GERD (gastroesophageal reflux disease)    Hyperlipidemia    Hypertension     Past Surgical History:  Procedure Laterality Date   APPENDECTOMY     COLONOSCOPY  08/24/2016   POLYPECTOMY     TRANSTHORACIC ECHOCARDIOGRAM  04/2018   EF 65-70% (vigorous). No RWMA.  Gr 1 DD.  Normal valves.  (In setting of COPD exacerbation)    Allergies as of 05/05/2021   No Known Allergies      Medication List        Accurate as of May 05, 2021  4:04 PM. If you have any questions, ask your nurse or doctor.          albuterol 108 (90 Base) MCG/ACT inhaler Commonly known as: VENTOLIN HFA INHALE 2 PUFFS INTO THE LUNGS EVERY 6 HOURS AS NEEDED FOR WHEEZING OR SHORTNESS OF BREATH   amLODipine 10 MG tablet Commonly known as: NORVASC Take 1 tablet (10 mg total) by mouth daily.   aspirin EC 81 MG tablet Take 81 mg by mouth daily. Swallow whole.   atorvastatin 80 MG tablet Commonly known as: LIPITOR Take 1 tablet (80 mg total) by mouth daily.   Bevespi Aerosphere 9-4.8 MCG/ACT Aero Generic drug: Glycopyrrolate-Formoterol Inhale 2 puffs into  the lungs 2 (two) times daily.   metoprolol tartrate 100 MG tablet Commonly known as: LOPRESSOR Take 1 tablet (100 mg total) by mouth 2 (two) times daily.   pantoprazole 40 MG tablet Commonly known as: PROTONIX Take 1 tablet (40 mg total) by mouth daily.   sildenafil 20 MG tablet Commonly known as: REVATIO Take 3-4 tablets (60-80 mg total) by mouth at bedtime as needed.           Objective:   Physical Exam BP 126/68 (BP Location: Left Arm, Patient Position: Sitting, Cuff Size: Small)    Pulse 91    Temp 98.3 F (36.8 C) (Oral)    Resp 16    Ht 5\' 8"  (1.727 m)    Wt 169 lb 8 oz (76.9 kg)    SpO2 93%    BMI 25.77 kg/m  General:   Well developed, NAD, BMI noted. HEENT:  Normocephalic . Face symmetric, atraumatic Lungs:  Wheezing noted. Normal respiratory effort, no intercostal retractions, no accessory muscle use. Heart: RRR,  no murmur.  Lower extremities: no pretibial edema bilaterally  Skin: Not pale. Not jaundice Neurologic:  alert & oriented X3.  Speech normal, gait appropriate for age and unassisted Psych--  Cognition and judgment appear intact.  Cooperative with normal attention span and  concentration.  Behavior appropriate. No anxious or depressed appearing.      Assessment     ASSESSMENT Hyperglycemia HTN GERD Hyperlipidemia, high TG Allergies , nasal  COPD, PFTs 2013 showed ratio of 58, FEV1 of 1.78- 52% without significant bronchodilator response, TLC was 77% with DLCO 78% Smoker: quit 2020 Aortic sclerosis per CT 05-2020 Coronary CTA, Ca+ Co score 103.0, L main artery, saw cards, probably still a low risk situation.  Plan is CV RF,  ASA not warranted at this point.  PLAN: HTN, increased creatinine, decreased potassium: Current medications amlodipine and metoprolol. BP is better.  He reports good hydration.   Creatinine was improving, potassium remained low.  Recheck CMP. Patient to call Endo and set up OV d/t hypokalemia, w/u? B12  deficiency: Since the last visit, was diagnosed with B12 deficiency, currently on injections.  Next injection in 1 month. Increase alkaline phosphatase: Abdominal US showed fatty liver otherwise negative.  Checking a CMP Mild anemia, increased ferritin, macrocytosis: He had a number of blood abnormalities including anemia, increased ferritin, macrocytosis. Macrocytosis probably related to B12 deficiency.  Increased ferritin related to chronic inflammation?  (has chronic diarrhea).  Rechecking labs Chronic diarrhea: Symptoms started 3 months ago, at the last visit we will check a C diff and  Hemoccult  >>> negative.  Refer to GI. COPD: Still wheezing, unable to get any medication except albuterol.  Our pharmacy is working on getting Bevespi RTC B12 injections monthly RTC checkup 4 months    This visit occurred during the SARS-CoV-2 public health emergency.  Safety protocols were in place, including screening questions prior to the visit, additional usage of staff PPE, and extensive cleaning of exam room while observing appropriate contact time as indicated for disinfecting solutions.

## 2021-05-05 NOTE — Patient Instructions (Addendum)
Please schedule nurse visit for B12 injection in 1 month.   Call the endocrinology office to see about an appointment  Were also referring you to the gastroenterology office  Check the  blood pressure regularly BP GOAL is between 110/65 and  135/85. If it is consistently higher or lower, let me know     GO TO THE LAB : Get the blood work     [place], [place] back for   a checkup in 4 months

## 2021-05-05 NOTE — Assessment & Plan Note (Signed)
HTN, increased creatinine, decreased potassium: Current medications amlodipine and metoprolol. BP is better.  He reports good hydration.   Creatinine was improving, potassium remained low.  Recheck CMP. Patient to call Endo and set up OV d/t hypokalemia, w/u? B12 deficiency: Since the last visit, was diagnosed with B12 deficiency, currently on injections.  Next injection in 1 month. Increase alkaline phosphatase: Abdominal US showed fatty liver otherwise negative.  Checking a CMP Mild anemia, increased ferritin, macrocytosis: He had a number of blood abnormalities including anemia, increased ferritin, macrocytosis. Macrocytosis probably related to B12 deficiency.  Increased ferritin related to chronic inflammation?  (has chronic diarrhea).  Rechecking labs Chronic diarrhea: Symptoms started 3 months ago, at the last visit we will check a C diff and  Hemoccult  >>> negative.  Refer to GI. COPD: Still wheezing, unable to get any medication except albuterol.  Our pharmacy is working on getting Bevespi RTC B12 injections monthly RTC checkup 4 months

## 2021-05-06 ENCOUNTER — Ambulatory Visit: Payer: Managed Care, Other (non HMO)

## 2021-05-06 LAB — CBC WITH DIFFERENTIAL/PLATELET
Basophils Absolute: 0.1 10*3/uL (ref 0.0–0.1)
Basophils Relative: 1.2 % (ref 0.0–3.0)
Eosinophils Absolute: 0.6 10*3/uL (ref 0.0–0.7)
Eosinophils Relative: 7.4 % — ABNORMAL HIGH (ref 0.0–5.0)
HCT: 38.2 % — ABNORMAL LOW (ref 39.0–52.0)
Hemoglobin: 12.7 g/dL — ABNORMAL LOW (ref 13.0–17.0)
Lymphocytes Relative: 19.1 % (ref 12.0–46.0)
Lymphs Abs: 1.6 10*3/uL (ref 0.7–4.0)
MCHC: 33.4 g/dL (ref 30.0–36.0)
MCV: 102.9 fl — ABNORMAL HIGH (ref 78.0–100.0)
Monocytes Absolute: 0.9 10*3/uL (ref 0.1–1.0)
Monocytes Relative: 11.1 % (ref 3.0–12.0)
Neutro Abs: 5.2 10*3/uL (ref 1.4–7.7)
Neutrophils Relative %: 61.2 % (ref 43.0–77.0)
Platelets: 299 10*3/uL (ref 150.0–400.0)
RBC: 3.71 Mil/uL — ABNORMAL LOW (ref 4.22–5.81)
RDW: 14.7 % (ref 11.5–15.5)
WBC: 8.6 10*3/uL (ref 4.0–10.5)

## 2021-05-06 LAB — COMPREHENSIVE METABOLIC PANEL
ALT: 38 U/L (ref 0–53)
AST: 50 U/L — ABNORMAL HIGH (ref 0–37)
Albumin: 4.3 g/dL (ref 3.5–5.2)
Alkaline Phosphatase: 154 U/L — ABNORMAL HIGH (ref 39–117)
BUN: 19 mg/dL (ref 6–23)
CO2: 29 mEq/L (ref 19–32)
Calcium: 8.1 mg/dL — ABNORMAL LOW (ref 8.4–10.5)
Chloride: 96 mEq/L (ref 96–112)
Creatinine, Ser: 1.7 mg/dL — ABNORMAL HIGH (ref 0.40–1.50)
GFR: 43.11 mL/min — ABNORMAL LOW (ref >60.00)
Glucose, Bld: 99 mg/dL (ref 70–99)
Potassium: 3.4 mEq/L — ABNORMAL LOW (ref 3.5–5.1)
Sodium: 139 mEq/L (ref 135–145)
Total Bilirubin: 0.8 mg/dL (ref 0.2–1.2)
Total Protein: 8.2 g/dL (ref 6.0–8.3)

## 2021-05-06 LAB — B12 AND FOLATE PANEL
Folate: 14.9 ng/mL (ref >5.9)
Vitamin B-12: >1550 pg/mL — ABNORMAL HIGH (ref 211–911)

## 2021-05-06 LAB — IBC + FERRITIN
Ferritin: 868.4 ng/mL — ABNORMAL HIGH (ref 22.0–322.0)
Iron: 46 ug/dL (ref 42–165)
Saturation Ratios: 12 % — ABNORMAL LOW (ref 20.0–50.0)
TIBC: 383.6 ug/dL (ref 250.0–450.0)
Transferrin: 274 mg/dL (ref 212.0–360.0)

## 2021-05-09 ENCOUNTER — Telehealth: Payer: Self-pay | Admitting: Internal Medicine

## 2021-05-09 DIAGNOSIS — E876 Hypokalemia: Secondary | ICD-10-CM

## 2021-05-09 DIAGNOSIS — N289 Disorder of kidney and ureter, unspecified: Secondary | ICD-10-CM

## 2021-05-09 NOTE — Telephone Encounter (Signed)
-   Increase creatinine, not on any medications that could adversely affect kidney function. - Increase ferritin, elevated LFTs, decreased renal function. Plan: - Hematology referral, R/O hemochromatosis.  Could he be seen within the next couple of weeks? - Renal ultrasound, increase creatinine - Nephrology referral: Dx increased creatinine, decreased potassium - Okay to cancel endocrinology referral (was referred for hypokalemia)  Please arrange and let the patient know

## 2021-05-10 NOTE — Telephone Encounter (Signed)
Will contact Pt via telephone later today. However, I did send information via Mychart, referrals placed, and order for renal US ordered.

## 2021-05-10 NOTE — Telephone Encounter (Signed)
Spoke w/ Pt- informed of recommendations. Pt verbalized understanding. We cancelled appt for endo.

## 2021-05-12 ENCOUNTER — Ambulatory Visit: Payer: 59 | Admitting: Pharmacist

## 2021-05-12 ENCOUNTER — Ambulatory Visit (HOSPITAL_BASED_OUTPATIENT_CLINIC_OR_DEPARTMENT_OTHER): Admission: RE | Admit: 2021-05-12 | Payer: Managed Care, Other (non HMO) | Source: Ambulatory Visit

## 2021-05-12 MED ORDER — AMLODIPINE BESYLATE 10 MG PO TABS: 10.0000 mg | ORAL_TABLET | Freq: Every day | ORAL | 1 refills | Status: DC

## 2021-05-15 ENCOUNTER — Telehealth (HOSPITAL_BASED_OUTPATIENT_CLINIC_OR_DEPARTMENT_OTHER): Payer: Self-pay

## 2021-05-15 MED ORDER — PANTOPRAZOLE SODIUM 40 MG PO TBEC: 40.0000 mg | DELAYED_RELEASE_TABLET | Freq: Every day | ORAL | 1 refills | Status: DC

## 2021-05-15 NOTE — Patient Instructions (Signed)
°  Hypertension: Blood pressure goal <140/90 BP Readings from Last 3 Encounters:  05/05/21 126/68  04/01/21 (!) 152/82  02/02/21 (!) 172/100   Current treatment: Amlodipine 10mg  daily  Metoprolol 100mg  twice a day  Interventions:  Education provided on blood pressure goal and importance of blood pressure control in prevention of cardiovascular and kidney disease Reviewed [employer] formulary - both amlodipine and metoprolol are on zero copay list.   Continue to take current blood pressure regimen - Great job getting blood pressure to goal!  Hyperlipidemia: Uncontrolled; LDL goal <100 and triglycerides <150 Lab Results  Component Value Date   CHOL 182 09/02/2020   CHOL 230 (H) 06/01/2020   CHOL 171 04/12/2019   Lab Results  Component Value Date   HDL 80.00 09/02/2020   HDL 72.60 06/01/2020   HDL 76 04/12/2019    Lab Results  Component Value Date   TRIG 387.0 (H) 09/02/2020   TRIG (H) 06/01/2020    568.0 Triglyceride is over 400; calculations on Lipids are invalid.   TRIG 200 (H) 04/12/2019    Lab Results  Component Value Date   LDLDIRECT 49.0 09/02/2020   LDLDIRECT 84.0 06/01/2020   LDLDIRECT 108.0 04/03/2018    Current treatment: Atorvastatin 80mg  daily   Interventions:  Education provided on lipids goals Continue atorvastatin 80mg  daily. Verified cost for 2023 with [employer] will be $0  Chronic Obstructive Pulmonary Disease: Uncontrolled; goal: decrease use of rescue inhaler / assist with cost of Bevespi Current treatment: Bevespi Inhaler - inhale 2 puffs into lungs twice a day  Albuterol inhaler - inhale 2 puffs every 6 hours as needed for wheezing or shortness of breath Interventions:  Education provided on maintenance versus rescue therapy Working with patient assistance program and pharmacy on [employer] approval  Patient Goals/Self-Care Activities Over the next 90 days, patient will:  take medications as prescribed, focus on medication adherence  by filling prescriptions on time, and collaborate with provider on medication access solutions  Follow Up Plan: Telephone follow up appointment with care management team member scheduled for:  2 weeks

## 2021-05-15 NOTE — Chronic Care Management (AMB) (Signed)
Care Management   Pharmacy Note  05/15/2021 Name: DENVIL CANNING MRN: [id number] DOB: 29-Aug-1960  Subjective: Richard Gates is a 61 y.o. year old male who is a primary care patient of Colon Branch, MD. The Care Management team was consulted for assistance with care management and care coordination needs.    Engaged with patient by telephone for follow up visit in response to provider referral for pharmacy case management and/or care coordination services.   The patient was given information about Care Management services today including:  Care Management services includes personalized support from designated clinical staff supervised by the patient's primary care provider, including individualized plan of care and coordination with other care providers. 24/7 contact phone numbers for assistance for urgent and routine care needs. The patient may stop case management services at any time by phone call to the office staff.  Patient agreed to services and consent obtained.  Assessment:  Review of patient status, including review of consultants reports, laboratory and other test data, was performed as part of comprehensive evaluation and provision of chronic care management services.   SDOH (Social Determinants of Health) assessments and interventions performed:    Recent Visits:  05/09/2021 - Internal Med (Dr Larose Kells) Phone Call. Referred to hematology due to increased ferritin, elevated LFTs (r/o hemochromatosis); Recommended renal U/S and referral to nephrology due to increased creatinine and hypokalemia. Cancelled referral to endocrinology.  05/05/2021 - Internal Med (Dr Larose Kells) Seen for follow up. Labs ordered. See above phone note regarding recommendation after labs reviewd by Dr Larose Kells.   Objective:  Lab Results  Component Value Date   CREATININE 1.70 (H) 05/05/2021   CREATININE 1.19 04/06/2021   CREATININE 1.04 03/01/2021    Lab Results  Component Value Date   HGBA1C 5.8 02/16/2021        Component Value Date/Time   CHOL 182 09/02/2020 1623   TRIG 387.0 (H) 09/02/2020 1623   HDL 80.00 09/02/2020 1623   CHOLHDL 2 09/02/2020 1623   VLDL 77.4 (H) 09/02/2020 1623   LDLCALC 68 04/12/2019 1553   LDLDIRECT 49.0 09/02/2020 1623    Other: (TSH, CBC, Vit D, etc.)  Clinical ASCVD: No  The 10-year ASCVD risk score (Arnett DK, et al., 2019) is: 11.5%   Values used to calculate the score:     Age: 57 years     Sex: Male     Is Non-Hispanic African American: Yes     Diabetic: No     Tobacco smoker: No     Systolic Blood Pressure: 884 mmHg     Is BP treated: Yes     HDL Cholesterol: 80 mg/dL     Total Cholesterol: 182 mg/dL     BP Readings from Last 3 Encounters:  05/05/21 126/68  04/01/21 (!) 152/82  02/02/21 (!) 172/100    Care Plan  No Known Allergies  Medications Reviewed Today     Reviewed by Cherre Robins, RPH-CPP (Pharmacist) on 05/12/21 at 27  Med List Status: <None>   Medication Order Taking? Sig Documenting Provider Last Dose Status Informant  albuterol (VENTOLIN HFA) 108 (90 Base) MCG/ACT inhaler [id number] Yes INHALE 2 PUFFS INTO THE LUNGS EVERY 6 HOURS AS NEEDED FOR WHEEZING OR SHORTNESS OF BREATH Colon Branch, MD Taking Active   amLODipine (NORVASC) 10 MG tablet [id number] Yes Take 1 tablet (10 mg total) by mouth daily. Colon Branch, MD Taking Active   aspirin EC 81 MG tablet [id number] Yes Take 81 mg by mouth  daily. Swallow whole. [provider] Taking Active   atorvastatin (LIPITOR) 80 MG tablet [id number] Yes Take 1 tablet (80 mg total) by mouth daily. Colon Branch, MD Taking Active   Glycopyrrolate-Formoterol (BEVESPI AEROSPHERE) 9-4.8 MCG/ACT Hollie Salk [id number] No Inhale 2 puffs into the lungs 2 (two) times daily.  Patient not taking: Reported on 05/12/2021   Colon Branch, MD Not Taking Active   metoprolol tartrate (LOPRESSOR) 100 MG tablet [id number] Yes Take 1 tablet (100 mg total) by mouth 2 (two) times daily. Colon Branch, MD Taking Active    pantoprazole (PROTONIX) 40 MG tablet [id number] Yes Take 1 tablet (40 mg total) by mouth daily. Colon Branch, MD Taking Active   sildenafil (REVATIO) 20 MG tablet [id number] No Take 3-4 tablets (60-80 mg total) by mouth at bedtime as needed.  Patient not taking: Reported on 05/12/2021   Colon Branch, MD Not Taking Active             Patient Active Problem List   Diagnosis Date Noted   Erectile dysfunction 09/07/2018   Hyperlipidemia with target LDL less than 70 08/17/2018   Left main coronary artery disease 06/15/2018   Coronary artery calcification seen on computed tomography 06/15/2018   DOE (dyspnea on exertion) 06/15/2018   Acute respiratory failure with hypoxia ([place]) 04/16/2018   Hypertension    Elevated troponin I level    COPD mixed type ([place]) 07/19/2016   PCP NOTES >>>>>>>>>>>>>>> 06/23/2016   Tobacco abuse 02/07/2012   Anxiety and depression 02/07/2012   Tachycardia 01/03/2012   Annual physical exam 05/12/2011    Conditions to be addressed/monitored: HTN, HLD, Hypertriglyceridemia, COPD, and Low B12; elevated LFTs; hypokalemia  Care Plan : General Pharmacy (Adult)  Updates made by Cherre Robins, RPH-CPP since 05/15/2021 12:00 AM     Problem: HTN; hyperdlipidemia; Low B12; Elevated LFTs; hypokalemia; COPD   Priority: High  Onset Date: 04/12/2021     Long-Range Goal: Provide education, support and care coordination for medication therapy and chronic conditions   Start Date: 04/12/2021  Priority: High  Note:   Current Barriers:  Unable to independently afford treatment regimen Unable to achieve control of hypertension or hyperlipidemia   Does not adhere to prescribed medication regimen  Pharmacist Clinical Goal(s):  Over the next 90 days, patient will verbalize ability to afford treatment regimen achieve control of hyperlipidemia and hypertension  as evidenced by LDL < 100 and Tg < 150 and blood pressure <140/90 adhere to prescribed medication regimen as  evidenced by refill history  through collaboration with PharmD and provider.   Interventions: 1:1 collaboration with Colon Branch, MD regarding development and update of comprehensive plan of care as evidenced by provider attestation and co-signature Inter-disciplinary care team collaboration (see longitudinal plan of care) Comprehensive medication review performed; medication list updated in electronic medical record  Hypertension: Improved blood pressure and last office blood pressure at goal since adherence to blood pressure medications has improved.  Current treatment: Amlodipine 10mg  daily  Metoprolol 100mg  twice a day  Current home readings: patient was not at home during call and did not have blood pressure readings Denies hypotensive/hypertensive symptoms Interventions:  Education provided on blood pressure goal and importance of blood pressure control in prevention of cardiovascular and kidney disease Reviewed adherence  Amlodipine filled 90 days 06/01/2020; 08/08/2020; 11/11/2020 ad 02/04/2021  - new prescription sent to pharmacy Metoprolol 50mg  90 days 08/08/2020 and metoprolol 100mg  30 days filled 04/01/2021 and 04/30/2021 Reviewed importance of adherence of  to blood pressure medications - commended patient on improved adherence!  Hyperlipidemia: Uncontrolled; LDL goal <100 and triglycerides <150 Current treatment: Atorvastatin 80mg  daily (changed at last visit due to being $0 on his health plan) Past medications tried: rosuvastatin (Changed in 2022 due to formulary restrictions)  Patient recently had elevated AST and alk. Phos.; abdominal ultrasound showed fatty liver. Suspect  LFTs might improve as lipids - LDL and Tg decrease.  Tolerating atorvastatin with no myalgias reported  Interventions:  Education provided on lipids goals  Continue to take atorvastatin 80mg  daily (last filled #30 04/14/2021)  Chronic Obstructive Pulmonary Disease: Uncontrolled; goal: decrease use of  rescue inhaler / assist with cost of Bevespi Current treatment: Bevespi Inhaler - inhale 2 puffs into lungs twice a day Albuterol inhaler - inhale 2 puffs every 6 hours as needed for wheezing or shortness of breath No exacerbations requiring treatment in the last 6 months  Patient is not currently using Bevespi due to cost (p; atient has high deductible ACO plan and cost is $400) Rescue inhaler use: patient is using daily - filled 05/07/2021; 12/13/202211/22/2022; 02/25/2021; 02/01/2021; 01/11/2021; 12/27/2020; 12/01/2020; 11/11/2020 Interventions:  Education provided on maintenance versus rescue therapy Completed application [place] Bevespi inhal[place] at [place]. However patient assistance program application was declined due to having insurance. Called [place] to discuss [place]. They will accept appeal but need information on cost of Bevespi on patient new insurance [employer] (>$400). Contacted [facility] and they faxed over information but was difficult to read and did not have patient's name on it. Requested again with request to provide information include patient name and identifier.   Also reviewed [employer] formulary. Advair is tier 1 = and thought this might be affordable to patient however when [facility] filled cost was $110 per 30 days due to deductible. Patient reported that this was not affordable. We also tried to use discount card for [facility] but only lower cost by $100 for total cost of around $300.   Patient Goals/Self-Care Activities Over the next 90 days, patient will:  take medications as prescribed, focus on medication adherence by filling prescriptions on time, and collaborate with provider on medication access solutions  Follow Up Plan: Telephone follow up appointment with care management team member scheduled for:  2 weeks         Medication Assistance:  Application for [place] [employer] [place]  medication assistance program. in process.   Anticipated assistance start date 06/05/2021.  See plan of care for additional detail. Working on appeal  Follow Up:  Patient agrees to Care Plan and Follow-up.  Plan: Telephone follow up appointment with care management team member scheduled for:  2 weeks  Cherre Robins, PharmD Clinical Pharmacist [facility]

## 2021-05-18 ENCOUNTER — Inpatient Hospital Stay: Payer: Managed Care, Other (non HMO)

## 2021-05-18 ENCOUNTER — Ambulatory Visit: Payer: Managed Care, Other (non HMO) | Admitting: Internal Medicine

## 2021-05-18 ENCOUNTER — Inpatient Hospital Stay: Payer: Managed Care, Other (non HMO) | Admitting: Family

## 2021-05-20 ENCOUNTER — Telehealth: Payer: Self-pay | Admitting: Internal Medicine

## 2021-05-20 ENCOUNTER — Ambulatory Visit: Payer: Managed Care, Other (non HMO) | Admitting: Pharmacist

## 2021-05-20 DIAGNOSIS — R7989 Other specified abnormal findings of blood chemistry: Secondary | ICD-10-CM

## 2021-05-20 DIAGNOSIS — I10 Essential (primary) hypertension: Secondary | ICD-10-CM

## 2021-05-20 DIAGNOSIS — E785 Hyperlipidemia, unspecified: Secondary | ICD-10-CM

## 2021-05-20 DIAGNOSIS — J449 Chronic obstructive pulmonary disease, unspecified: Secondary | ICD-10-CM

## 2021-05-20 NOTE — Telephone Encounter (Signed)
See Chronic Care Management phone visit notes.

## 2021-05-20 NOTE — Telephone Encounter (Signed)
Patient would like a call back from Tammy to discuss an inhaler that was prescribed to him but was too expensive. He would like a call whenever possible. Please advice.

## 2021-05-21 ENCOUNTER — Ambulatory Visit (INDEPENDENT_AMBULATORY_CARE_PROVIDER_SITE_OTHER): Payer: 59 | Admitting: Nurse Practitioner

## 2021-05-21 ENCOUNTER — Other Ambulatory Visit: Payer: 59

## 2021-05-21 ENCOUNTER — Encounter: Payer: Self-pay | Admitting: Nurse Practitioner

## 2021-05-21 VITALS — BP 100/60 | HR 96 | Ht 66.0 in | Wt 174.0 lb

## 2021-05-21 DIAGNOSIS — R197 Diarrhea, unspecified: Secondary | ICD-10-CM

## 2021-05-21 MED ORDER — FAMOTIDINE 20 MG PO TABS: 20.0000 mg | ORAL_TABLET | Freq: Every day | ORAL | 1 refills | Status: DC

## 2021-05-21 NOTE — Patient Instructions (Signed)
MEDICATION: We have sent the following medication to your pharmacy for you to pick up at your convenience: Famotidine 20 MG tablet, take once a day.  RECOMMENDATIONS:  Stop Pantoprazole. Contact us in 2 weeks with an update. Contact us sooner if the diarrhea worsens.   LABS:  A stool test has been ordered for you today. Our lab is located in the basement. Press "B" on the elevator. The lab is located at the first door on the left as you exit the elevator.  HEALTHCARE LAWS AND MY CHART RESULTS:  Due to recent changes in healthcare laws, you may see the results of your imaging and laboratory studies on MyChart before your provider has had a chance to review them.   We understand that in some cases there may be results that are confusing or concerning to you. Not all laboratory results come back in the same time frame and the provider may be waiting for multiple results in order to interpret others.  Please give [place] 48 hours in order for your provider to thoroughly review all the results before contacting the office for clarification of your results.   It was great seeing you today! Thank you for entrusting me with your care and choosing [facility]  Noralyn Pick, CRNP  The [facility] providers would like to encourage you to use [facility] to communicate with providers for non-urgent requests or questions.  Due to long hold times on the telephone, sending your provider a message by [facility] may be faster and more efficient way to get a response. Please allow 48 business hours for a response.  Please remember that this is for non-urgent requests/questions. If you are age 62 or older, your body mass index should be between 23-30. Your Body mass index is 28.08 kg/m. If this is out of the aforementioned range listed, please consider follow up with your Primary Care Provider.  If you are age 62 or younger, your body mass index should be between 19-25. Your Body mass index is  28.08 kg/m. If this is out of the aformentioned range listed, please consider follow up with your Primary Care Provider.

## 2021-05-21 NOTE — Progress Notes (Signed)
05/21/2021 Richard Gates [id number] Feb 04, 1961   Chief Complaint: Diarrhea  History of Present Illness: Richard Gates is a 61 year old male with a past medical history of hypertension, hyperlipidemia, coronary artery calcifications per CT, GERD symptoms, B12 deficiency anemia, COPD and colon polyps.  He presents to our office today as referred by Dr. Past for further evaluation regarding diarrhea.  He developed nonbloody mud like brown diarrhea 4 months ago.  He initially passed 5-6 episodes of diarrhea daily for the first few weeks then 8 episodes daily for the next 6 to 8 weeks.  He was seen by Dr. Larose Kells and C. difficile testing was negative.  He continues to pass 5-6 episodes of diarrhea daily with intermittent urgency.  He is soiled himself on 1 or 2 occasions as he was unable to get to the bathroom in time.  He works at [employer] and if he lifts heavy equipment he will pass an urgent diarrhea BM.  He denies having any abdominal pain.  He has occasional anal itchiness and sometimes sees a spot of red blood on the toilet tissue.  He denies any specific food or stress triggers.  No antibiotics within the past 6 months.  He started taking Pantoprazole 40 mg daily 6 to 8 months ago for acid reflux symptoms.  He stated his reflux symptoms resolved.  His dairy intake is limited and the days he is dairy free he still has diarrhea.  No weight loss.  He underwent a screening Dr. Hilarie Fredrickson 10/01/2019 which identified 5 tubular adenomatous polyps which were removed from the colon and rectum, no colitis.  CBC Latest Ref Rng & Units 05/05/2021 04/06/2021 03/01/2021  WBC 4.0 - 10.5 K/uL 8.6 6.9 6.5  Hemoglobin 13.0 - 17.0 g/dL 12.7(L) 11.7(L) 11.8(L)  Hematocrit 39.0 - 52.0 % 38.2(L) 35.8(L) 35.7(L)  Platelets 150.0 - 400.0 K/uL 299.0 345.0 322.0  B12 148, iron 69, TIBC 330.4 on 06/07/2020. CMP Latest Ref Rng & Units 05/05/2021 04/06/2021 03/01/2021  Glucose 70 - 99 mg/dL 99 89 95  BUN 6 - 23 mg/dL _0 Creatinine 0.40 - 1.50 mg/dL 1.70(H) 1.19 1.04  Sodium 135 - 145 mEq/L 139 144 142  Potassium 3.5 - 5.1 mEq/L 3.4(L) 3.3(L) 3.1(L)  Chloride 96 - 112 mEq/L 96 100 94(L)  CO2 19 - 32 mEq/L 29 31 32  Calcium 8.4 - 10.5 mg/dL 8.1(L) 7.7(L) 6.6(L)  Total Protein 6.0 - 8.3 g/dL 8.2 6.9 -  Total Bilirubin 0.2 - 1.2 mg/dL 0.8 0.5 -  Alkaline Phos 39 - 117 U/L 154(H) 151(H) -  AST 0 - 37 U/L 50(H) 47(H) -  ALT 0 - 53 U/L 38 35 -  TSH 2.11 on 09/02/2020.  Colonoscopy 10/01/2019:  One 4 mm polyp in the cecum, removed with a cold snare. Resected and retrieved. - Two 4 to 5 mm polyps in the transverse colon, removed with a cold snare. Resected and retrieved. - One 5 mm polyp in the descending colon, removed with a cold snare. Resected and retrieved. - One 3 mm polyp in the distal rectum, removed with a cold snare. Resected and retrieved. - Diverticulosis from cecum to sigmoid colon. - Internal hemorrhoids. - 3 year recall 1. Surgical [P], colon, transverse and cecum, polyp (3) - TUBULAR ADENOMA (X3 FRAGMENTS). - NO HIGH GRADE DYSPLASIA OR MALIGNANCY. 2. Surgical [P], colon, descending and rectum, polyp (2) - TUBULAR ADENOMA. - HYPERPLASTIC POLYP. - NO HIGH GRADE DYSPLASIA OR MALIGNANCY.  Current Outpatient Medications on File Prior to Visit  Medication Sig Dispense Refill   albuterol (VENTOLIN HFA) 108 (90 Base) MCG/ACT inhaler INHALE 2 PUFFS INTO THE LUNGS EVERY 6 HOURS AS NEEDED FOR WHEEZING OR SHORTNESS OF BREATH 18 g 5   amLODipine (NORVASC) 10 MG tablet Take 1 tablet (10 mg total) by mouth daily. 90 tablet 1   aspirin EC 81 MG tablet Take 81 mg by mouth daily. Swallow whole.     atorvastatin (LIPITOR) 80 MG tablet Take 1 tablet (80 mg total) by mouth daily. 30 tablet 5   metoprolol tartrate (LOPRESSOR) 100 MG tablet Take 1 tablet (100 mg total) by mouth 2 (two) times daily. 60 tablet 1   pantoprazole (PROTONIX) 40 MG tablet Take 1 tablet (40 mg total) by mouth daily. 90 tablet 1    sildenafil (REVATIO) 20 MG tablet Take 3-4 tablets (60-80 mg total) by mouth at bedtime as needed. 30 tablet 3   No current facility-administered medications on file prior to visit.   No Known Allergies  Current Medications, Allergies, Past Medical History, Past Surgical History, Family History and Social History were reviewed in [employer] record.  Review of Systems:   Constitutional: Negative for fever, sweats, chills or weight loss.  Respiratory: Negative for shortness of breath.   Cardiovascular: Negative for chest pain, palpitations and leg swelling.  Gastrointestinal: See HPI.  Musculoskeletal: Negative for back pain or muscle aches.  Neurological: Negative for dizziness, headaches or paresthesias.   Physical Exam: BP 100/60 (BP Location: Left Arm, Patient Position: Sitting, Cuff Size: Normal)    Pulse 96    Ht _0  (1.676 m) Comment: height measured without shoes   Wt 174 lb (78.9 kg)    BMI 28.08 kg/m  General: 61 year old male no acute distress. Head: Normocephalic and atraumatic. Eyes: No scleral icterus. Conjunctiva pink . Ears: Normal auditory acuity. Mouth: Upper dentures.  No ulcers or lesions.  Lungs: Clear throughout to auscultation. Heart: Regular rate and rhythm, no murmur. Abdomen: Soft, nontender and nondistended. No masses or hepatomegaly. Normal bowel sounds x 4 quadrants.  Rectal: Deferred. Musculoskeletal: Symmetrical with no gross deformities. Extremities: No edema. Neurological: Alert oriented x 4. No focal deficits.  Psychological: Alert and cooperative. Normal mood and affect  Assessment and Recommendations: 63) 61 year old male with nonbloody diarrhea x  4 months.  No associated abdominal pain. No recent antibiotics.  Colonoscopy 10/01/2019 identified 5 benign polyps removed from the colon and rectum, diverticulosis without evidence of colitis.  Started on PPI for GERD symptoms 6 to 8 months ago.  C. Difficile PCR negative on  04/12/2021. -Stop Pantoprazole, diarrhea possibly side effect from PPI -Start famotidine 20 mg 1 p.o. daily to avoid rebound reflux after stopping pantoprazole -GI pathogen panel -Bland diet, push fluid -Imodium 1 tab daily as needed, hold if no bowel movement 24 hours -Patient to call me in 2 weeks with an update, sooner if his symptoms worsen -We will plan to add probiotic if diarrhea persists off of PPI therapy -Diagnostic colonoscopy to rule out colitis if the above evaluation inconclusive and diarrhea persists or worsens  2) 5 tubular adenomatous polyps removed from the colon and rectum per colonoscopy 10/2019 -Recall colonoscopy due 10/2022  3) Recently vitamin B12 deficiency with associated macrocytic anemia.  On B12 injections once monthly. -Continue follow-up with PCP  4) History of GERD symptoms -Pantoprazole discontinued as noted above -Famotidine 20 mg 1 p.o. daily for now -Consider EGD if reflux symptoms recur,  in setting of B12 deficiency it may also be appropriate to complete an EGD to rule out atrophic gastritis  5) Elevated alk phos and AST level, evaluation by Dr. Larose Kells.  RUQ sonogram 04/13/2021 showed a fatty liver otherwise was unremarkable.  Elevated ferritin level, patient was referred to hematology to rule out hemochromatosis.  Unlikely, hemochromatosis as his iron levels are normal.  Elevated  ferritin level likely due to acute phase reactant response.  -Recommend repeat hepatic panel in 3 to 4 weeks -If his alk phos and ALT levels remain elevated, consider hepatitis A, B and C serologies, ANA, SMA, AMA, ceruloplasmin level, IgG, alpha-1 antitrypsin level and GGT. -His creatinine level improves would recommend CTAP versus abdominal MRI/MRCP  6) CKD, rising creatinine level.  Referred to nephrology.

## 2021-05-22 NOTE — Patient Instructions (Signed)
°  Hypertension: Blood pressure goal <140/90 BP Readings from Last 3 Encounters:  05/05/21 126/68  04/01/21 (!) 152/82  02/02/21 (!) 172/100   Current treatment: Amlodipine 10mg  daily  Metoprolol 100mg  twice a day  Interventions:  Education provided on blood pressure goal and importance of blood pressure control in prevention of cardiovascular and kidney disease Reviewed [employer] formulary - both amlodipine and metoprolol are on zero copay list.   Continue to take current blood pressure regimen - Great job getting blood pressure to goal!  Hyperlipidemia: Uncontrolled; LDL goal <100 and triglycerides <150 Lab Results  Component Value Date   CHOL 182 09/02/2020   CHOL 230 (H) 06/01/2020   CHOL 171 04/12/2019   Lab Results  Component Value Date   HDL 80.00 09/02/2020   HDL 72.60 06/01/2020   HDL 76 04/12/2019    Lab Results  Component Value Date   TRIG 387.0 (H) 09/02/2020   TRIG (H) 06/01/2020    568.0 Triglyceride is over 400; calculations on Lipids are invalid.   TRIG 200 (H) 04/12/2019    Lab Results  Component Value Date   LDLDIRECT 49.0 09/02/2020   LDLDIRECT 84.0 06/01/2020   LDLDIRECT 108.0 04/03/2018    Current treatment: Atorvastatin 80mg  daily   Interventions:  Education provided on lipids goals Continue atorvastatin 80mg  daily. Verified cost for 2023 with [employer] will be $0  Chronic Obstructive Pulmonary Disease: Uncontrolled; goal: decrease use of rescue inhaler / assist with cost of Bevespi Current treatment: Bevespi Inhaler - inhale 2 puffs into lungs twice a day  Albuterol inhaler - inhale 2 puffs every 6 hours as needed for wheezing or shortness of breath Interventions:  Education provided on maintenance versus rescue therapy Working with patient assistance program and pharmacy on [employer] approval  Patient Goals/Self-Care Activities Over the next 90 days, patient will:  take medications as prescribed, focus on medication adherence  by filling prescriptions on time, and collaborate with provider on medication access solutions  Follow Up Plan: Telephone follow up appointment with care management team member scheduled for:  2 weeks

## 2021-05-22 NOTE — Chronic Care Management (AMB) (Signed)
Care Management   Pharmacy Note  05/22/2021 Name: Richard Gates MRN: [id number] DOB: [birth year]  Summary:  Appeal for Richard Gates patient assistance program has been [place] by [place] [employer] [place] and they are requesting additional information. They need patient to provide proof of his income - tax form or pay stub. Patient will request from his HR department and being into office.  Due to have amlodipine and atorvastatin filled. Coordinated refills with patient and pharmacy.  Exploring other possibly options for patient assistance though [employer] and [employer] do not currently have open funding for asthma medications.   Subjective: Richard Gates is a 61 y.o. year old male who is a primary care patient of Wanda Plump, MD. The Care Management team was consulted for assistance with care management and care coordination needs.    Engaged with patient by telephone for follow up visit in response to provider referral for pharmacy case management and/or care coordination services.   The patient was given information about Care Management services today including:  Care Management services includes personalized support from designated clinical staff supervised by the patient's primary care provider, including individualized plan of care and coordination with other care providers. 24/7 contact phone numbers for assistance for urgent and routine care needs. The patient may stop case management services at any time by phone call to the office staff.  Patient agreed to services and consent obtained.  Assessment:  Review of patient status, including review of consultants reports, laboratory and other test data, was performed as part of comprehensive evaluation and provision of chronic care management services.   SDOH (Social Determinants of Health) assessments and interventions performed:    Recent Visits:  05/09/2021 - Internal Med (Dr Drue Novel) Phone Call. Referred to hematology due to  increased ferritin, elevated LFTs (r/o hemochromatosis); Recommended renal U/S and referral to nephrology due to increased creatinine and hypokalemia. Cancelled referral to endocrinology.  05/05/2021 - Internal Med (Dr Drue Novel) Seen for follow up. Labs ordered. See above phone note regarding recommendation after labs reviewd by Dr Drue Novel.   Objective:  Lab Results  Component Value Date   CREATININE 1.70 (H) 05/05/2021   CREATININE 1.19 04/06/2021   CREATININE 1.04 03/01/2021    Lab Results  Component Value Date   HGBA1C 5.8 02/16/2021       Component Value Date/Time   CHOL 182 09/02/2020 1623   TRIG 387.0 (H) 09/02/2020 1623   HDL 80.00 09/02/2020 1623   CHOLHDL 2 09/02/2020 1623   VLDL 77.4 (H) 09/02/2020 1623   LDLCALC 68 04/12/2019 1553   LDLDIRECT 49.0 09/02/2020 1623    Other: (TSH, CBC, Vit D, etc.)  Clinical ASCVD: No  The 10-year ASCVD risk score (Arnett DK, et al., 2019) is: 7.6%   Values used to calculate the score:     Age: 45 years     Sex: Male     Is Non-Hispanic African American: Yes     Diabetic: No     Tobacco smoker: No     Systolic Blood Pressure: 100 mmHg     Is BP treated: Yes     HDL Cholesterol: 80 mg/dL     Total Cholesterol: 182 mg/dL     BP Readings from Last 3 Encounters:  05/21/21 100/60  05/05/21 126/68  04/01/21 (!) 152/82    Care Plan  No Known Allergies  Medications Reviewed Today     Reviewed by Bernita Buffy, CMA (Certified Medical Assistant) on 05/21/21 at 1504  Med List Status: <None>   Medication Order Taking? Sig Documenting Provider Last Dose Status Informant  albuterol (VENTOLIN HFA) 108 (90 Base) MCG/ACT inhaler [id number] Yes INHALE 2 PUFFS INTO THE LUNGS EVERY 6 HOURS AS NEEDED FOR WHEEZING OR SHORTNESS OF BREATH Colon Branch, MD Taking Active   amLODipine (NORVASC) 10 MG tablet [id number] Yes Take 1 tablet (10 mg total) by mouth daily. Colon Branch, MD Taking Active   aspirin EC 81 MG tablet [id number] Yes Take 81 mg by  mouth daily. Swallow whole. [provider] Taking Active   atorvastatin (LIPITOR) 80 MG tablet [id number] Yes Take 1 tablet (80 mg total) by mouth daily. Colon Branch, MD Taking Active   metoprolol tartrate (LOPRESSOR) 100 MG tablet [id number] Yes Take 1 tablet (100 mg total) by mouth 2 (two) times daily. Colon Branch, MD Taking Active   pantoprazole (PROTONIX) 40 MG tablet [id number] Yes Take 1 tablet (40 mg total) by mouth daily. Colon Branch, MD Taking Active   sildenafil (REVATIO) 20 MG tablet [id number] Yes Take 3-4 tablets (60-80 mg total) by mouth at bedtime as needed. Colon Branch, MD Taking Active             Patient Active Problem List   Diagnosis Date Noted   Erectile dysfunction 09/07/2018   Hyperlipidemia with target LDL less than 70 08/17/2018   Gates main coronary artery disease 06/15/2018   Coronary artery calcification seen on computed tomography 06/15/2018   DOE (dyspnea on exertion) 06/15/2018   Acute respiratory failure with hypoxia ([place]) 04/16/2018   Hypertension    Elevated troponin I level    COPD mixed type ([place]) 07/19/2016   PCP NOTES >>>>>>>>>>>>>>> 06/23/2016   Tobacco abuse 02/07/2012   Anxiety and depression 02/07/2012   Tachycardia 01/03/2012   Annual physical exam 05/12/2011    Conditions to be addressed/monitored: HTN, HLD, Hypertriglyceridemia, COPD, and Low B12; elevated LFTs; hypokalemia  Care Plan : General Pharmacy (Adult)  Updates made by Cherre Robins, RPH-CPP since 05/22/2021 12:00 AM     Problem: HTN; hyperdlipidemia; Low B12; Elevated LFTs; hypokalemia; COPD   Priority: High  Onset Date: 04/12/2021     Long-Range Goal: Provide education, support and care coordination for medication therapy and chronic conditions   Start Date: 04/12/2021  Priority: High  Note:   Current Barriers:  Unable to independently afford treatment regimen Unable to achieve control of hypertension or hyperlipidemia   Does not adhere to prescribed  medication regimen  Pharmacist Clinical Goal(s):  Over the next 90 days, patient will verbalize ability to afford treatment regimen achieve control of hyperlipidemia and hypertension  as evidenced by LDL < 100 and Tg < 150 and blood pressure <140/90 adhere to prescribed medication regimen as evidenced by refill history  through collaboration with PharmD and provider.   Interventions: 1:1 collaboration with Colon Branch, MD regarding development and update of comprehensive plan of care as evidenced by provider attestation and co-signature Inter-disciplinary care team collaboration (see longitudinal plan of care) Comprehensive medication review performed; medication list updated in electronic medical record  Hypertension: Improved blood pressure and last office blood pressure at goal since adherence to blood pressure medications has improved.  Current treatment: Amlodipine 10mg  daily  Metoprolol 100mg  twice a day  Current home readings: patient was not at home during call and did not have blood pressure readings Denies hypotensive/hypertensive symptoms Interventions:  Education provided on blood pressure goal and importance of blood pressure control in prevention of  cardiovascular and kidney disease Reviewed adherence  Amlodipine filled 90 days 06/01/2020; 08/08/2020; 11/11/2020 and 02/04/2021  - new prescription sent to pharmacy Metoprolol 50mg  90 days 08/08/2020 and metoprolol 100mg  30 days filled 04/01/2021 and 04/30/2021 Reviewed importance of adherence of to blood pressure medications - commended patient on improved adherence!  Hyperlipidemia: Uncontrolled; LDL goal <100 and triglycerides <150 Current treatment: Atorvastatin 80mg  daily (changed at last visit due to being $0 on his health plan) Past medications tried: rosuvastatin (Changed in 2022 due to formulary restrictions)  Patient recently had elevated AST and alk. Phos.; abdominal ultrasound showed fatty liver. Suspect  LFTs might  improve as lipids - LDL and Tg decrease.  Tolerating atorvastatin with no myalgias reported  Interventions:  Education provided on lipids goals  Continue to take atorvastatin 80mg  daily (last filled #30 04/14/2021) - coordinated refill with patient and pharmacy  Chronic Obstructive Pulmonary Disease: Uncontrolled; goal: decrease use of rescue inhaler / assist with cost of Bevespi Current treatment: Bevespi Inhaler - inhale 2 puffs into lungs twice a day Albuterol inhaler - inhale 2 puffs every 6 hours as needed for wheezing or shortness of breath No exacerbations requiring treatment in the last 6 months  Patient is not currently using Bevespi due to cost (patient has high deductible ACO plan and cost is $400) Rescue inhaler use: patient is using daily - filled 05/07/2021; 12/13/202211/22/2022; 02/25/2021; 02/01/2021; 01/11/2021; 12/27/2020; 12/01/2020; 11/11/2020 Working on appeal for [place] [employer] for patient assistance program for [place] Also reviewed [employer] formulary previously. Advair is tier 1 = and thought this might be affordable to patient however when [facility] filled cost was $110 per 30 days due to deductible. Patient reported that this was not affordable. We also tried to use discount card for [facility] but only lower cost by $100 for total cost of around $300.  Interventions:  Education provided on maintenance versus rescue therapy Completed application process for Bevespi inhaler with [place] [employer] [place] at last visit. However patient assistance program application was declined due to having insurance. We sent an appeal with documentation of Bevespi cost to [place] [employer] but they are not requiring proof of income with either tax information or copy of pay stub. Patient will check with his HR department to get copy of recent pay stub.   I also checked to see if he would qualify for copay assistance through the asthma fund with [employer] [place]. Initial assessment on line  showed that he did not qualify but will try to call program directly as it appear he should qualify per program brochure.  Assessed for open funds for asthma from [facility] and other assistance funds but at this time programs for assistance with asthma medications is not open.   Patient Goals/Self-Care Activities Over the next 90 days, patient will:  take medications as prescribed, focus on medication adherence by filling prescriptions on time, and collaborate with provider on medication access solutions  Follow Up Plan: Telephone follow up appointment with care management team member scheduled for:  2 weeks         Medication Assistance:  [place] [employer] [place]ication [place] was denied. Appeal is in process. Patient to provide documentation of income - either tax records or recent pay stub.   Follow Up:  Patient agrees to Care Plan and Follow-up.  Plan: Telephone follow up appointment with care management team member scheduled for:  2 weeks  Cherre Robins, PharmD Clinical Pharmacist [place]  [place]

## 2021-05-24 NOTE — Progress Notes (Signed)
Addendum: Reviewed and agree with assessment and management plan. Agree hereditary hemochromatosis unlikely however would send the genetic hemochromatosis lab work if his alk phos and ALT remain elevated.  Agree with the other recommended labs should his liver enzymes remain elevated.   Aireonna Bauer, Lajuan Lines, MD

## 2021-05-26 ENCOUNTER — Other Ambulatory Visit: Payer: Self-pay | Admitting: Internal Medicine

## 2021-05-26 MED ORDER — METOPROLOL TARTRATE 100 MG PO TABS: 100.0000 mg | ORAL_TABLET | Freq: Two times a day (BID) | ORAL | 1 refills | Status: DC

## 2021-05-27 NOTE — Addendum Note (Signed)
Addended by: Kelle Darting A on: 05/27/2021 01:46 PM   Modules accepted: Orders

## 2021-05-28 ENCOUNTER — Other Ambulatory Visit: Payer: Self-pay | Admitting: Family

## 2021-05-28 NOTE — Progress Notes (Signed)
Patient has appointment with hematologist on 05/31/2021, I will review that consult prior to ordering additional labs.

## 2021-05-30 ENCOUNTER — Encounter (HOSPITAL_BASED_OUTPATIENT_CLINIC_OR_DEPARTMENT_OTHER): Payer: Self-pay | Admitting: Emergency Medicine

## 2021-05-30 ENCOUNTER — Emergency Department (HOSPITAL_BASED_OUTPATIENT_CLINIC_OR_DEPARTMENT_OTHER): Payer: 59

## 2021-05-30 ENCOUNTER — Other Ambulatory Visit: Payer: Self-pay

## 2021-05-30 ENCOUNTER — Inpatient Hospital Stay (HOSPITAL_BASED_OUTPATIENT_CLINIC_OR_DEPARTMENT_OTHER)
Admission: EM | Admit: 2021-05-30 | Discharge: 2021-06-02 | DRG: 641 | Disposition: A | Discharge status: Home / Self Care | Payer: 59 | Attending: Family Medicine | Admitting: Family Medicine

## 2021-05-30 DIAGNOSIS — E785 Hyperlipidemia, unspecified: Secondary | ICD-10-CM | POA: Diagnosis present

## 2021-05-30 DIAGNOSIS — I10 Essential (primary) hypertension: Secondary | ICD-10-CM | POA: Diagnosis present

## 2021-05-30 DIAGNOSIS — J449 Chronic obstructive pulmonary disease, unspecified: Secondary | ICD-10-CM | POA: Diagnosis present

## 2021-05-30 DIAGNOSIS — K219 Gastro-esophageal reflux disease without esophagitis: Secondary | ICD-10-CM | POA: Diagnosis present

## 2021-05-30 DIAGNOSIS — Z7982 Long term (current) use of aspirin: Secondary | ICD-10-CM

## 2021-05-30 DIAGNOSIS — L03115 Cellulitis of right lower limb: Secondary | ICD-10-CM

## 2021-05-30 DIAGNOSIS — R9431 Abnormal electrocardiogram [ECG] [EKG]: Secondary | ICD-10-CM | POA: Diagnosis not present

## 2021-05-30 DIAGNOSIS — R197 Diarrhea, unspecified: Secondary | ICD-10-CM | POA: Diagnosis present

## 2021-05-30 DIAGNOSIS — N289 Disorder of kidney and ureter, unspecified: Secondary | ICD-10-CM

## 2021-05-30 DIAGNOSIS — Z87891 Personal history of nicotine dependence: Secondary | ICD-10-CM | POA: Diagnosis not present

## 2021-05-30 DIAGNOSIS — R609 Edema, unspecified: Secondary | ICD-10-CM | POA: Diagnosis not present

## 2021-05-30 DIAGNOSIS — Z79899 Other long term (current) drug therapy: Secondary | ICD-10-CM | POA: Diagnosis not present

## 2021-05-30 DIAGNOSIS — Z20822 Contact with and (suspected) exposure to covid-19: Secondary | ICD-10-CM | POA: Diagnosis present

## 2021-05-30 DIAGNOSIS — R55 Syncope and collapse: Secondary | ICD-10-CM | POA: Diagnosis not present

## 2021-05-30 DIAGNOSIS — R569 Unspecified convulsions: Secondary | ICD-10-CM | POA: Diagnosis present

## 2021-05-30 DIAGNOSIS — I2510 Atherosclerotic heart disease of native coronary artery without angina pectoris: Secondary | ICD-10-CM | POA: Diagnosis present

## 2021-05-30 DIAGNOSIS — Z8249 Family history of ischemic heart disease and other diseases of the circulatory system: Secondary | ICD-10-CM | POA: Diagnosis not present

## 2021-05-30 DIAGNOSIS — E876 Hypokalemia: Secondary | ICD-10-CM | POA: Diagnosis present

## 2021-05-30 DIAGNOSIS — I82452 Acute embolism and thrombosis of left peroneal vein: Secondary | ICD-10-CM | POA: Diagnosis present

## 2021-05-30 DIAGNOSIS — N179 Acute kidney failure, unspecified: Secondary | ICD-10-CM | POA: Diagnosis present

## 2021-05-30 LAB — COMPREHENSIVE METABOLIC PANEL
ALT: 24 U/L (ref 0–44)
AST: 41 U/L (ref 15–41)
Albumin: 3.3 g/dL — ABNORMAL LOW (ref 3.5–5.0)
Alkaline Phosphatase: 121 U/L (ref 38–126)
Anion gap: 13 (ref 5–15)
BUN: 15 mg/dL (ref 8–23)
CO2: 26 mmol/L (ref 22–32)
Calcium: 6.2 mg/dL — CL (ref 8.9–10.3)
Chloride: 97 mmol/L — ABNORMAL LOW (ref 98–111)
Creatinine, Ser: 1.39 mg/dL — ABNORMAL HIGH (ref 0.61–1.24)
GFR, Estimated: 58 mL/min — ABNORMAL LOW (ref >60)
Glucose, Bld: 103 mg/dL — ABNORMAL HIGH (ref 70–99)
Potassium: 2.4 mmol/L — CL (ref 3.5–5.1)
Sodium: 136 mmol/L (ref 135–145)
Total Bilirubin: 0.6 mg/dL (ref 0.3–1.2)
Total Protein: 7.8 g/dL (ref 6.5–8.1)

## 2021-05-30 LAB — URINALYSIS, ROUTINE W REFLEX MICROSCOPIC
Bilirubin Urine: NEGATIVE
Glucose, UA: NEGATIVE mg/dL
Ketones, ur: NEGATIVE mg/dL
Leukocytes,Ua: NEGATIVE
Nitrite: NEGATIVE
Protein, ur: 30 mg/dL — AB
Specific Gravity, Urine: 1.015 (ref 1.005–1.030)
pH: 6 (ref 5.0–8.0)

## 2021-05-30 LAB — URINALYSIS, MICROSCOPIC (REFLEX)

## 2021-05-30 LAB — CBC WITH DIFFERENTIAL/PLATELET
Abs Immature Granulocytes: 0.04 10*3/uL (ref 0.00–0.07)
Basophils Absolute: 0 10*3/uL (ref 0.0–0.1)
Basophils Relative: 0 %
Eosinophils Absolute: 0.1 10*3/uL (ref 0.0–0.5)
Eosinophils Relative: 1 %
HCT: 34.9 % — ABNORMAL LOW (ref 39.0–52.0)
Hemoglobin: 12.1 g/dL — ABNORMAL LOW (ref 13.0–17.0)
Immature Granulocytes: 0 %
Lymphocytes Relative: 13 %
Lymphs Abs: 1.3 10*3/uL (ref 0.7–4.0)
MCH: 33.3 pg (ref 26.0–34.0)
MCHC: 34.7 g/dL (ref 30.0–36.0)
MCV: 96.1 fL (ref 80.0–100.0)
Monocytes Absolute: 1.4 10*3/uL — ABNORMAL HIGH (ref 0.1–1.0)
Monocytes Relative: 14 %
Neutro Abs: 7.2 10*3/uL (ref 1.7–7.7)
Neutrophils Relative %: 72 %
Platelets: 239 10*3/uL (ref 150–400)
RBC: 3.63 MIL/uL — ABNORMAL LOW (ref 4.22–5.81)
RDW: 13.5 % (ref 11.5–15.5)
WBC: 10 10*3/uL (ref 4.0–10.5)
nRBC: 0 % (ref 0.0–0.2)

## 2021-05-30 LAB — MAGNESIUM: Magnesium: 0.7 mg/dL — CL (ref 1.7–2.4)

## 2021-05-30 LAB — RESP PANEL BY RT-PCR (FLU A&B, COVID) ARPGX2
Influenza A by PCR: NEGATIVE
Influenza B by PCR: NEGATIVE
SARS Coronavirus 2 by RT PCR: NEGATIVE

## 2021-05-30 LAB — BRAIN NATRIURETIC PEPTIDE: B Natriuretic Peptide: 59.6 pg/mL (ref 0.0–100.0)

## 2021-05-30 LAB — CBG MONITORING, ED: Glucose-Capillary: 99 mg/dL (ref 70–99)

## 2021-05-30 MED ORDER — AMLODIPINE BESYLATE 10 MG PO TABS
10.0000 mg | ORAL_TABLET | Freq: Every day | ORAL | Status: DC
  Administered 2021-05-31 – 2021-06-02 (×3): 10 mg via ORAL
  Filled 2021-05-30 (×3): qty 1

## 2021-05-30 MED ORDER — POTASSIUM CHLORIDE CRYS ER 20 MEQ PO TBCR
40.0000 meq | EXTENDED_RELEASE_TABLET | Freq: Once | ORAL | Status: AC
  Administered 2021-05-30: 40 meq via ORAL
  Filled 2021-05-30: qty 2

## 2021-05-30 MED ORDER — ATORVASTATIN CALCIUM 40 MG PO TABS
80.0000 mg | ORAL_TABLET | Freq: Every day | ORAL | Status: DC
  Administered 2021-05-31 – 2021-06-02 (×3): 80 mg via ORAL
  Filled 2021-05-30 (×3): qty 2

## 2021-05-30 MED ORDER — ACETAMINOPHEN 650 MG RE SUPP: 650.0000 mg | Freq: Four times a day (QID) | RECTAL | Status: DC | PRN

## 2021-05-30 MED ORDER — MAGNESIUM SULFATE 4 GM/100ML IV SOLN
4.0000 g | Freq: Once | INTRAVENOUS | Status: AC
  Administered 2021-05-31: 4 g via INTRAVENOUS
  Filled 2021-05-30: qty 100

## 2021-05-30 MED ORDER — ALBUTEROL SULFATE (2.5 MG/3ML) 0.083% IN NEBU: 3.0000 mL | INHALATION_SOLUTION | Freq: Four times a day (QID) | RESPIRATORY_TRACT | Status: DC | PRN

## 2021-05-30 MED ORDER — ASPIRIN EC 81 MG PO TBEC
81.0000 mg | DELAYED_RELEASE_TABLET | Freq: Every day | ORAL | Status: DC
  Administered 2021-05-31 – 2021-06-02 (×3): 81 mg via ORAL
  Filled 2021-05-30 (×3): qty 1

## 2021-05-30 MED ORDER — ACETAMINOPHEN 325 MG PO TABS
650.0000 mg | ORAL_TABLET | Freq: Four times a day (QID) | ORAL | Status: DC | PRN
  Administered 2021-05-31 (×3): 650 mg via ORAL
  Filled 2021-05-30 (×3): qty 2

## 2021-05-30 MED ORDER — SODIUM CHLORIDE 0.9% FLUSH
3.0000 mL | Freq: Two times a day (BID) | INTRAVENOUS | Status: DC
  Administered 2021-05-30 – 2021-06-02 (×6): 3 mL via INTRAVENOUS

## 2021-05-30 MED ORDER — FAMOTIDINE 20 MG PO TABS
20.0000 mg | ORAL_TABLET | Freq: Every day | ORAL | Status: DC
  Administered 2021-05-31 – 2021-06-02 (×3): 20 mg via ORAL
  Filled 2021-05-30 (×3): qty 1

## 2021-05-30 MED ORDER — CALCIUM GLUCONATE-NACL 1-0.675 GM/50ML-% IV SOLN
1.0000 g | Freq: Once | INTRAVENOUS | Status: AC
  Administered 2021-05-30: 1000 mg via INTRAVENOUS
  Filled 2021-05-30: qty 50

## 2021-05-30 MED ORDER — MAGNESIUM SULFATE 2 GM/50ML IV SOLN
2.0000 g | Freq: Once | INTRAVENOUS | Status: AC
  Administered 2021-05-30: 2 g via INTRAVENOUS
  Filled 2021-05-30: qty 50

## 2021-05-30 MED ORDER — POTASSIUM CHLORIDE 10 MEQ/100ML IV SOLN
10.0000 meq | INTRAVENOUS | Status: AC
  Administered 2021-05-30: 10 meq via INTRAVENOUS
  Filled 2021-05-30: qty 100

## 2021-05-30 MED ORDER — UMECLIDINIUM BROMIDE 62.5 MCG/ACT IN AEPB
1.0000 | INHALATION_SPRAY | Freq: Every day | RESPIRATORY_TRACT | Status: DC
  Administered 2021-05-31 – 2021-06-02 (×3): 1 via RESPIRATORY_TRACT
  Filled 2021-05-30: qty 7

## 2021-05-30 MED ORDER — METOPROLOL TARTRATE 50 MG PO TABS
100.0000 mg | ORAL_TABLET | Freq: Two times a day (BID) | ORAL | Status: DC
  Administered 2021-05-31 – 2021-06-02 (×5): 100 mg via ORAL
  Filled 2021-05-30 (×5): qty 2

## 2021-05-30 MED ORDER — SODIUM CHLORIDE 0.9 % IV SOLN
1.0000 g | Freq: Once | INTRAVENOUS | Status: DC
  Filled 2021-05-30: qty 10

## 2021-05-30 MED ORDER — SODIUM CHLORIDE 0.9 % IV BOLUS
500.0000 mL | Freq: Once | INTRAVENOUS | Status: AC
  Administered 2021-05-30: 500 mL via INTRAVENOUS

## 2021-05-30 MED ORDER — SODIUM CHLORIDE 0.9 % IV SOLN: INTRAVENOUS | Status: DC | PRN

## 2021-05-30 MED ORDER — ARFORMOTEROL TARTRATE 15 MCG/2ML IN NEBU
15.0000 ug | INHALATION_SOLUTION | Freq: Two times a day (BID) | RESPIRATORY_TRACT | Status: DC
  Administered 2021-05-31 – 2021-06-02 (×4): 15 ug via RESPIRATORY_TRACT
  Filled 2021-05-30 (×5): qty 2

## 2021-05-30 MED ORDER — ENOXAPARIN SODIUM 40 MG/0.4ML IJ SOSY
40.0000 mg | PREFILLED_SYRINGE | INTRAMUSCULAR | Status: DC
  Administered 2021-05-31: 40 mg via SUBCUTANEOUS
  Filled 2021-05-30: qty 0.4

## 2021-05-30 MED ORDER — POTASSIUM CHLORIDE IN NACL 20-0.9 MEQ/L-% IV SOLN
INTRAVENOUS | Status: DC
Start: 2021-05-31 — End: 2021-05-30
  Filled 2021-05-30: qty 1000

## 2021-05-30 MED ORDER — LOPERAMIDE HCL 2 MG PO CAPS: 2.0000 mg | ORAL_CAPSULE | ORAL | Status: DC | PRN

## 2021-05-30 NOTE — ED Provider Notes (Signed)
Care assumed from previous provider at shift change. Im summation  Friday night, felt fatigue, dizzy at work, LE swelling Bl. 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 Does not remember episode. Seizure like activity last night per wife with shaking and confusion after episode. EMS evaluated and rec ED eval however declined. Hematuria this morning. No dysuria, flank pain. Previous episode of shaking 4 months ago, also not evaluated at that time. No documented hx of seizure. No HA, AMS, Fever, CP, SOB, ABD pain. Uses tobacco. No hx of PE, DVT. Bladder scan < 50cc. Physical Exam  BP 129/77 (BP Location: Right Arm)    Pulse 97    Temp 99 F (37.2 C) (Oral)    Resp 20    Ht 5\' 6"  (1.676 m)    Wt 78.9 kg    SpO2 97%    BMI 28.08 kg/m   Physical Exam Vitals and nursing note reviewed.  Constitutional:      General: He is not in acute distress.    Appearance: He is well-developed. He is not ill-appearing, toxic-appearing or diaphoretic.  HENT:     Head: Atraumatic.  Eyes:     Pupils: Pupils are equal, round, and reactive to light.  Cardiovascular:     Rate and Rhythm: Normal rate and regular rhythm.  Pulmonary:     Effort: Pulmonary effort is normal. No respiratory distress.  Abdominal:     General: There is no distension.     Palpations: Abdomen is soft.  Musculoskeletal:        General: Normal range of motion.     Cervical back: Normal range of motion and neck supple.     Right lower leg: Edema present.     Left lower leg: Edema present.     Comments: RLE> LLE edema with erythema  Skin:    General: Skin is warm and dry.  Neurological:     General: No focal deficit present.     Mental Status: He is alert and oriented to person, place, and time.     Comments: CN 2-12 grossly intact Unsteady on feet     Procedures  .Critical Care Performed by: Nettie Elm, PA-C Authorized by: Nettie Elm, PA-C   Critical care provider statement:    Critical care time (minutes):   31   Critical care was necessary to treat or prevent imminent or life-threatening deterioration of the following conditions:  Metabolic crisis   Critical care was time spent personally by me on the following activities:  Development of treatment plan with patient or surrogate, discussions with consultants, evaluation of patient's response to treatment, examination of patient, ordering and review of laboratory studies, ordering and review of radiographic studies, ordering and performing treatments and interventions, pulse oximetry, re-evaluation of patient's condition and review of old charts Labs Reviewed  URINALYSIS, ROUTINE W REFLEX MICROSCOPIC - Abnormal; Notable for the following components:      Result Value   Hgb urine dipstick SMALL (*)    Protein, ur 30 (*)    All other components within normal limits  COMPREHENSIVE METABOLIC PANEL - Abnormal; Notable for the following components:   Potassium 2.4 (*)    Chloride 97 (*)    Glucose, Bld 103 (*)    Creatinine, Ser 1.39 (*)    Calcium 6.2 (*)    Albumin 3.3 (*)    GFR, Estimated 58 (*)    All other components within normal limits  CBC WITH DIFFERENTIAL/PLATELET - Abnormal; Notable for  the following components:   RBC 3.63 (*)    Hemoglobin 12.1 (*)    HCT 34.9 (*)    Monocytes Absolute 1.4 (*)    All other components within normal limits  MAGNESIUM - Abnormal; Notable for the following components:   Magnesium 0.7 (*)    All other components within normal limits  URINALYSIS, MICROSCOPIC (REFLEX) - Abnormal; Notable for the following components:   Bacteria, UA RARE (*)    All other components within normal limits  RESP PANEL BY RT-PCR (FLU A&B, COVID) [id number]  URINE CULTURE  BRAIN NATRIURETIC PEPTIDE  CBG MONITORING, ED   CT Head Wo Contrast  Result Date: 05/30/2021 CLINICAL DATA:  Syncopal episode possible seizure. EXAM: CT HEAD WITHOUT CONTRAST TECHNIQUE: Contiguous axial images were obtained from the base of the skull through  the vertex without intravenous contrast. RADIATION DOSE REDUCTION: This exam was performed according to the departmental dose-optimization program which includes automated exposure control, adjustment of the mA and/or kV according to patient size and/or use of iterative reconstruction technique. COMPARISON:  None. FINDINGS: Brain: No evidence of acute infarction, hemorrhage, hydrocephalus, extra-axial collection or mass lesion/mass effect. Vascular: No hyperdense vessel or unexpected calcification. Skull: Normal. Negative for fracture or focal lesion. Sinuses/Orbits: The orbits and orbital contents are unremarkable. There is deformity of the nasal bone consistent with a likely chronic fracture. There is mild membrane thickening in the ethmoid air cells and maxillary sinuses without fluid level. Other sinuses are clear. There is trace fluid in both mastoid tips, with otherwise clear mastoid airspaces. Other: None. IMPRESSION: 1. No acute intracranial CT findings. 2. Sinus membrane disease. 3. Trace mastoid effusions. 4. Deformity of the nasal bone likely chronic. Correlate clinically for point tenderness or recent trauma. Electronically Signed   By: Telford Nab M.D.   On: 05/30/2021 20:03   US Venous Img Lower Right (DVT Study)  Result Date: 05/30/2021 CLINICAL DATA:  Right leg pain and swelling for 2 days EXAM: RIGHT LOWER EXTREMITY VENOUS DOPPLER ULTRASOUND TECHNIQUE: Gray-scale sonography with graded compression, as well as color Doppler and duplex ultrasound were performed to evaluate the lower extremity deep venous systems from the level of the common femoral vein and including the common femoral, femoral, profunda femoral, popliteal and calf veins including the posterior tibial, peroneal and gastrocnemius veins when visible. The superficial great saphenous vein was also interrogated. Spectral Doppler was utilized to evaluate flow at rest and with distal augmentation maneuvers in the common femoral,  femoral and popliteal veins. COMPARISON:  None. FINDINGS: Contralateral Common Femoral Vein: Respiratory phasicity is normal and symmetric with the symptomatic side. No evidence of thrombus. Normal compressibility. Common Femoral Vein: No evidence of thrombus. Normal compressibility, respiratory phasicity and response to augmentation. Saphenofemoral Junction: No evidence of thrombus. Normal compressibility and flow on color Doppler imaging. Profunda Femoral Vein: No evidence of thrombus. Normal compressibility and flow on color Doppler imaging. Femoral Vein: No evidence of thrombus. Normal compressibility, respiratory phasicity and response to augmentation. Popliteal Vein: No evidence of thrombus. Normal compressibility, respiratory phasicity and response to augmentation. Calf Veins: No evidence of thrombus. Normal compressibility and flow on color Doppler imaging. Superficial Great Saphenous Vein: No evidence of thrombus. Normal compressibility. Venous Reflux:  None. Other Findings:  None. IMPRESSION: No evidence of deep venous thrombosis. Electronically Signed   By: Inez Catalina M.D.   On: 05/30/2021 20:07   DG Chest Portable 1 View  Result Date: 05/30/2021 CLINICAL DATA:  le edema, sob EXAM: PORTABLE CHEST 1  VIEW COMPARISON:  CT chest 06/01/2020 FINDINGS: The heart and mediastinal contours are within normal limits. Atherosclerotic plaque. No focal consolidation. No pulmonary edema. No pleural effusion. No pneumothorax. No acute osseous abnormality.  Old healed right rib fracture. IMPRESSION: 1. No active disease. 2.  Aortic Atherosclerosis (ICD10-I70.0). Electronically Signed   By: Iven Finn M.D.   On: 05/30/2021 20:34    ED Course / MDM     61 year old here for evaluation of syncope versus seizure.  Found to have significant electrolyte abnormalities, requiring multiple rounds of IV supplementation.  Also noted to have lower extremity swelling, DVT ultrasound personally reviewed and interpreted  which were negative on right for DVT, unable to perform left due to timing of staff here however clinically patient likely has cellulitis.  He is neurovascularly intact.  His heart and lungs are clear. Abdomen soft, nontender.  Denies PND, orthopnea, chest pain or shortness of breath.  Low suspicion for new onset CHF.  Patient getting electrolyte replacements currently.  Bladder scan less than 50 cc.  UA without infection, does show small blood. Ct head without acute abnormality  Will needs admission for significant electrolyte abnormality Potassium 2.4, calcium 6.2, magnesium 0.7  CONSULT with Dr. Tonie Griffith with [facility] who agrees accept patient in transfer for admission  Discussed results of labs and imaging with patient and wife in room.  Agreeable for admission.  The patient appears reasonably stabilized for admission considering the current resources, flow, and capabilities available in the ED at this time, and I doubt any other [facility] requiring further screening and/or treatment in the ED prior to admission.   Medical Decision Making Amount and/or Complexity of Data Reviewed Independent Historian: spouse External Data Reviewed: labs, radiology and notes. Labs: ordered. Decision-making details documented in ED Course. Radiology: ordered and independent interpretation performed. Decision-making details documented in ED Course. ECG/medicine tests: ordered and independent interpretation performed. Decision-making details documented in ED Course.  Risk OTC drugs. Prescription drug management. Parenteral controlled substances. Drug therapy requiring intensive monitoring for toxicity. Decision regarding hospitalization. Diagnosis or treatment significantly limited by social determinants of health.      Tymeir Weathington A, PA-C 05/30/21 [id number]    Lajean Saver, MD 05/31/21 [phone]

## 2021-05-30 NOTE — ED Notes (Signed)
[place] of L leg added on. Pt receiving IV electrolytes presently. Per PA ok to wait on L leg [place] to be completed during admission. [place] tech, Ailene Ravel made aware.

## 2021-05-30 NOTE — ED Notes (Signed)
Per bladder scan, result of 30ml, in and out will be attempted at later time. Patient states unable to urinate at this time. Provider informed, fluids ordered.

## 2021-05-30 NOTE — H&P (Signed)
History and Physical    Richard Gates FMB:[id number] DOB: [birth year] DOA: 05/30/2021  PCP: Colon Branch, MD   Patient coming from: Home   Chief Complaint: Seizure like activity   HPI: Richard Gates is a pleasant 61 y.o. male with medical history significant for hypertension, COPD, coronary artery calcifications, now presenting to the emergency department after an episode of seizure-like activity last night.  Patient reports that he had recently developed bilateral leg swelling, worse on the right, was feeling lightheaded and dizzy at times, and then last night was folding close when he suffered a loss of consciousness.  His wife heard him fall and came to check on him, and reports that both arms and legs were shaking and he had some drooling.  She called EMS and the patient regained awareness when they were there.  He refused transport to the ED at that time.  He also had a similar episode approximately 4 months ago that he was never evaluated for.  He is following with GI for 4 months of watery diarrhea without abdominal pain or bleeding.  States that he quit drinking 2 weeks ago and prior to that was having two 48 oz cans of hard lemonade on weeknights and 6 or seven 48 oz cans per day on the weekend.  He denies ever having tremors or sweats when he quit and his last drink was 2 weeks ago.  He denies any headache or focal numbness or weakness.  Denies chest pain, shortness of breath, fevers, or chills.  [facility] ED Course: Upon arrival to the ED, patient is found to be afebrile, saturating well on room air, and with systolic blood pressure in the low 100s.  EKG features sinus rhythm with QTC 505 ms.  Chest x-ray negative for acute cardiopulmonary disease.  Head CT negative for acute intracranial abnormality.  Venous Doppler of the right lower extremities negative for DVT.  Chemistry panel features a potassium 2.4, magnesium 0.7, calcium 6.2, and creatinine 1.39.  CBC with slight normocytic anemia.   Patient was given 500 cc of saline, 1 g IV calcium, 2 g IV magnesium, and 30 mill equivalents IV potassium in the ED.  He was transferred to [facility] for admission.  Review of Systems:  All other systems reviewed and apart from HPI, are negative.  Past Medical History:  Diagnosis Date   COPD (chronic obstructive pulmonary disease) ([place])    Coronary artery calcification seen on CAT scan 08/2019   Coronary calcium score 103; short LM with<25% mixed calcific plaque (minimal); aortic atherosclerosis with normal size.  No dissection.  No aortic valve calcification   GERD (gastroesophageal reflux disease)    Hyperlipidemia    Hypertension     Past Surgical History:  Procedure Laterality Date   APPENDECTOMY     COLONOSCOPY  08/24/2016   POLYPECTOMY     TRANSTHORACIC ECHOCARDIOGRAM  04/2018   EF 65-70% (vigorous). No RWMA.  Gr 1 DD.  Normal valves.  (In setting of COPD exacerbation)    Social History:   reports that he quit smoking about 3 years ago. His smoking use included cigarettes. He has a 30.75 pack-year smoking history. He has never used smokeless tobacco. He reports that he does not currently use alcohol. He reports that he does not use drugs.  No Known Allergies  Family History  Problem Relation Age of Onset   Stroke Father        July 7017: complicated by hemmrhage -- lost eyesight,  memory loss   Hypertension Other    Heart disease Paternal Uncle    Breast cancer Sister    Heart disease Paternal Aunt        surgery   Heart attack Maternal Uncle    Colon cancer Neg Hx    Prostate cancer Neg Hx    Colon polyps Neg Hx    Esophageal cancer Neg Hx    Stomach cancer Neg Hx    Rectal cancer Neg Hx      Prior to Admission medications   Medication Sig Start Date End Date Taking? Authorizing Provider  albuterol (VENTOLIN HFA) 108 (90 Base) MCG/ACT inhaler INHALE 2 PUFFS INTO THE LUNGS EVERY 6 HOURS AS NEEDED FOR WHEEZING OR SHORTNESS OF BREATH 04/13/21   Colon Branch, MD  amLODipine (NORVASC) 10 MG tablet Take 1 tablet (10 mg total) by mouth daily. 05/12/21   Colon Branch, MD  aspirin EC 81 MG tablet Take 81 mg by mouth daily. Swallow whole.    [provider]  atorvastatin (LIPITOR) 80 MG tablet Take 1 tablet (80 mg total) by mouth daily. 04/13/21   Colon Branch, MD  famotidine (PEPCID) 20 MG tablet Take 1 tablet (20 mg total) by mouth daily. 05/21/21   Noralyn Pick, NP  Glycopyrrolate-Formoterol (BEVESPI AEROSPHERE) 9-4.8 MCG/ACT AERO Inhale 2 puffs into the lungs 2 (two) times daily.    [provider]  metoprolol tartrate (LOPRESSOR) 100 MG tablet Take 1 tablet (100 mg total) by mouth 2 (two) times daily. 05/26/21   Colon Branch, MD  pantoprazole (PROTONIX) 40 MG tablet Take 1 tablet (40 mg total) by mouth daily. 05/15/21   Colon Branch, MD  sildenafil (REVATIO) 20 MG tablet Take 3-4 tablets (60-80 mg total) by mouth at bedtime as needed. 09/16/19   Colon Branch, MD    Physical Exam: Vitals:   05/30/21 2030 05/30/21 2045 05/30/21 2200 05/30/21 2303  BP: 129/72 124/67 129/77 (!) 158/72  Pulse: 83 93 97 (!) 102  Resp: (!) 24 18 20 20   Temp:   99 F (37.2 C) 98.5 F (36.9 C)  TempSrc:   Oral Oral  SpO2: 96% 94% 97% 99%  Weight:      Height:        Constitutional: NAD, calm  Eyes: PERTLA, lids and conjunctivae normal ENMT: Mucous membranes are moist. Posterior pharynx clear of any exudate or lesions.   Neck: supple, no masses  Respiratory:  no wheezing, no crackles. No accessory muscle use.  Cardiovascular: S1 & S2 heard, regular rate and rhythm. Pretibial pitting edema, Rt > Lt. Abdomen: No tenderness, soft. Bowel sounds active.  Musculoskeletal: no clubbing / cyanosis. No joint deformity upper and lower extremities.   Skin: no significant rashes, lesions, ulcers. Warm, dry, well-perfused. Neurologic: CN 2-12 grossly intact. Strength 5/5 in all 4 limbs. Alert and oriented.  Psychiatric: Pleasant. Cooperative.     Labs and Imaging on Admission: I have personally reviewed following labs and imaging studies  CBC: Recent Labs  Lab 05/30/21 1900  WBC 10.0  NEUTROABS 7.2  HGB 12.1*  HCT 34.9*  MCV 96.1  PLT 163   Basic Metabolic Panel: Recent Labs  Lab 05/30/21 1900  NA 136  K 2.4*  CL 97*  CO2 26  GLUCOSE 103*  BUN 15  CREATININE 1.39*  CALCIUM 6.2*  MG 0.7*   GFR: Estimated Creatinine Clearance: 55.1 mL/min (A) (by C-G formula based on SCr of 1.39 mg/dL (H)).  Liver Function Tests: Recent Labs  Lab 05/30/21 1900  AST 41  ALT 24  ALKPHOS 121  BILITOT 0.6  PROT 7.8  ALBUMIN 3.3*   No results for input(s): LIPASE, AMYLASE in the last 168 hours. No results for input(s): AMMONIA in the last 168 hours. Coagulation Profile: No results for input(s): INR, PROTIME in the last 168 hours. Cardiac Enzymes: No results for input(s): CKTOTAL, CKMB, CKMBINDEX, TROPONINI in the last 168 hours. BNP (last 3 results) No results for input(s): PROBNP in the last 8760 hours. HbA1C: No results for input(s): HGBA1C in the last 72 hours. CBG: Recent Labs  Lab 05/30/21 1827  GLUCAP 99   Lipid Profile: No results for input(s): CHOL, HDL, LDLCALC, TRIG, CHOLHDL, LDLDIRECT in the last 72 hours. Thyroid Function Tests: No results for input(s): TSH, T4TOTAL, FREET4, T3FREE, THYROIDAB in the last 72 hours. Anemia Panel: No results for input(s): VITAMINB12, FOLATE, FERRITIN, TIBC, IRON, RETICCTPCT in the last 72 hours. Urine analysis:    Component Value Date/Time   COLORURINE YELLOW 05/30/2021 1947   APPEARANCEUR CLEAR 05/30/2021 1947   LABSPEC 1.015 05/30/2021 1947   PHURINE 6.0 05/30/2021 [place] 05/30/2021 1947   GLUCOSEU NEGATIVE 02/03/2021 1557   HGBUR SMALL (A) 05/30/2021 [place] 05/30/2021 [place] 05/30/2021 1947   PROTEINUR 30 (A) 05/30/2021 1947   UROBILINOGEN 0.2 02/03/2021 1557   NITRITE NEGATIVE 05/30/2021 1947    LEUKOCYTESUR NEGATIVE 05/30/2021 1947   Sepsis Labs: @LABRCNTIP (procalcitonin:4,lacticidven:4) ) Recent Results (from the past 240 hour(s))  Resp Panel by RT-PCR (Flu A&B, Covid) Nasopharyngeal Swab     Status: None   Collection Time: 05/30/21  7:47 PM   Specimen: Nasopharyngeal Swab; Nasopharyngeal(NP) swabs in vial transport medium  Result Value Ref Range Status   SARS Coronavirus 2 by RT PCR NEGATIVE NEGATIVE Final    Comment: (NOTE) SARS-CoV-2 target nucleic acids are NOT DETECTED.  The SARS-CoV-2 RNA is generally detectable in upper respiratory specimens during the acute phase of infection. The lowest concentration of SARS-CoV-2 viral copies this assay can detect is 138 copies/mL. A negative result does not preclude SARS-Cov-2 infection and should not be used as the sole basis for treatment or other patient management decisions. A negative result may occur with  improper specimen collection/handling, submission of specimen other than nasopharyngeal swab, presence of viral mutation(s) within the areas targeted by this assay, and inadequate number of viral copies(<138 copies/mL). A negative result must be combined with clinical observations, patient history, and epidemiological information. The expected result is Negative.  Fact Sheet for Patients:  EntrepreneurPulse.com.au  Fact Sheet for Healthcare Providers:  IncredibleEmployment.be  This test is no t yet approved or cleared by the [place] FDA and  has been authorized for detection and/or diagnosis of SARS-CoV-2 by FDA under an Emergency Use Authorization (EUA). This EUA will remain  in effect (meaning this test can be used) for the duration of the COVID-19 declaration under Section 564(b)(1) of the Act, 21 U.S.C.section 360bbb-3(b)(1), unless the authorization is terminated  or revoked sooner.       Influenza A by PCR NEGATIVE NEGATIVE Final   Influenza B by PCR NEGATIVE  NEGATIVE Final    Comment: (NOTE) The Xpert Xpress SARS-CoV-2/FLU/RSV plus assay is intended as an aid in the diagnosis of influenza from Nasopharyngeal swab specimens and should not be used as a sole basis for treatment. Nasal washings and aspirates are unacceptable for Xpert Xpress SARS-CoV-2/FLU/RSV testing.  Fact  Sheet for Patients: EntrepreneurPulse.com.au  Fact Sheet for Healthcare Providers: IncredibleEmployment.be  This test is not yet approved or cleared by the [place] FDA and has been authorized for detection and/or diagnosis of SARS-CoV-2 by FDA under an Emergency Use Authorization (EUA). This EUA will remain in effect (meaning this test can be used) for the duration of the COVID-19 declaration under Section 564(b)(1) of the Act, 21 U.S.C. section 360bbb-3(b)(1), unless the authorization is terminated or revoked.  Performed at [facility], [address] [place], [place] [address]      Radiological Exams on Admission: CT Head Wo Contrast  Result Date: 05/30/2021 CLINICAL DATA:  Syncopal episode possible seizure. EXAM: CT HEAD WITHOUT CONTRAST TECHNIQUE: Contiguous axial images were obtained from the base of the skull through the vertex without intravenous contrast. RADIATION DOSE REDUCTION: This exam was performed according to the departmental dose-optimization program which includes automated exposure control, adjustment of the mA and/or kV according to patient size and/or use of iterative reconstruction technique. COMPARISON:  None. FINDINGS: Brain: No evidence of acute infarction, hemorrhage, hydrocephalus, extra-axial collection or mass lesion/mass effect. Vascular: No hyperdense vessel or unexpected calcification. Skull: Normal. Negative for fracture or focal lesion. Sinuses/Orbits: The orbits and orbital contents are unremarkable. There is deformity of the nasal bone consistent with a likely chronic fracture. There  is mild membrane thickening in the ethmoid air cells and maxillary sinuses without fluid level. Other sinuses are clear. There is trace fluid in both mastoid tips, with otherwise clear mastoid airspaces. Other: None. IMPRESSION: 1. No acute intracranial CT findings. 2. Sinus membrane disease. 3. Trace mastoid effusions. 4. Deformity of the nasal bone likely chronic. Correlate clinically for point tenderness or recent trauma. Electronically Signed   By: Telford Nab M.D.   On: 05/30/2021 20:03   US Venous Img Lower Right (DVT Study)  Result Date: 05/30/2021 CLINICAL DATA:  Right leg pain and swelling for 2 days EXAM: RIGHT LOWER EXTREMITY VENOUS DOPPLER ULTRASOUND TECHNIQUE: Gray-scale sonography with graded compression, as well as color Doppler and duplex ultrasound were performed to evaluate the lower extremity deep venous systems from the level of the common femoral vein and including the common femoral, femoral, profunda femoral, popliteal and calf veins including the posterior tibial, peroneal and gastrocnemius veins when visible. The superficial great saphenous vein was also interrogated. Spectral Doppler was utilized to evaluate flow at rest and with distal augmentation maneuvers in the common femoral, femoral and popliteal veins. COMPARISON:  None. FINDINGS: Contralateral Common Femoral Vein: Respiratory phasicity is normal and symmetric with the symptomatic side. No evidence of thrombus. Normal compressibility. Common Femoral Vein: No evidence of thrombus. Normal compressibility, respiratory phasicity and response to augmentation. Saphenofemoral Junction: No evidence of thrombus. Normal compressibility and flow on color Doppler imaging. Profunda Femoral Vein: No evidence of thrombus. Normal compressibility and flow on color Doppler imaging. Femoral Vein: No evidence of thrombus. Normal compressibility, respiratory phasicity and response to augmentation. Popliteal Vein: No evidence of thrombus. Normal  compressibility, respiratory phasicity and response to augmentation. Calf Veins: No evidence of thrombus. Normal compressibility and flow on color Doppler imaging. Superficial Great Saphenous Vein: No evidence of thrombus. Normal compressibility. Venous Reflux:  None. Other Findings:  None. IMPRESSION: No evidence of deep venous thrombosis. Electronically Signed   By: Inez Catalina M.D.   On: 05/30/2021 20:07   DG Chest Portable 1 View  Result Date: 05/30/2021 CLINICAL DATA:  le edema, sob EXAM: PORTABLE CHEST 1 VIEW COMPARISON:  CT  chest 06/01/2020 FINDINGS: The heart and mediastinal contours are within normal limits. Atherosclerotic plaque. No focal consolidation. No pulmonary edema. No pleural effusion. No pneumothorax. No acute osseous abnormality.  Old healed right rib fracture. IMPRESSION: 1. No active disease. 2.  Aortic Atherosclerosis (ICD10-I70.0). Electronically Signed   By: Iven Finn M.D.   On: 05/30/2021 20:34    EKG: Independently reviewed. Sinus rhythm, QTc 505 ms.   Assessment/Plan  1. Seizure  - Presents after generalized seizure like activity  - No acute findings on head CT  - He had recently quit drinking but states last drink was 2 wks ago - Likely from critical electrolyte derangements  - Replace electrolytes, check EEG and MRI    2. Hypokalemia; hypomagnesemia; hypocalcemia  - Serum potassium is 2.4, magnesium 0.7, and calcium 6.2 with albumin 3.3 in ED  - Treated with 30 mEq IV potassium, 2 g IV mag, and 1 g IV calcium in ED  - Likely related to watery diarrhea, and possibly from recent poor nutrition  - Treat with additional mag, potassium, and calcium, and repeat chemistries in am    3. Renal insufficiency  - SCr is 1.39 on admission; baseline unclear, currently down from 1.7 earlier this month but up from 1.2 in December  - Renally-dose medications, monitor   4. Leg swelling  - Recent pitting edema to bilateral LEs, R>L  - No DVT on RLE [place] in ED, BNP  normal, albumin 3.3, EF was 65-70% on TTE in 2019  - Elevate legs, consider stopping Norvasc   5. Hypertension  - Continue Norvasc and  metoprolol as tolerated    6. CAD - No anginal complaints  - Continue ASA 81, Lipitor, metoprolol   7. Prolonged QT interval  - QTc is 505 ms in ED  - Continue cardiac monitoring, correct electrolytes, avoid QT-prolonging medications    8. Diarrhea  - Pt reports at least 4 months of watery diarrhea, had neg C diff testing last month, is following with GI for this, on bland diet, and taking imodium as needed     DVT prophylaxis: Lovenox  Code Status: Full  Level of Care: Level of care: Telemetry Family Communication: None present  Disposition Plan:  Patient is from: Home  Anticipated d/c is to: Home  Anticipated d/c date is: 06/02/21  Patient currently: pending electrolyte repletion, MRI brain, EEG  Consults called: none  Admission status: Inpatient     Vianne Bulls, MD [facility]  05/30/2021, 11:42 PM

## 2021-05-30 NOTE — ED Notes (Signed)
Attempted IV x 1 - obtained labs

## 2021-05-30 NOTE — ED Notes (Signed)
PA Lauren Erskine Speed notified of critical potassium 2.4 and calcium 6.2. EMT obtaining EKG presently.

## 2021-05-30 NOTE — ED Triage Notes (Signed)
Pt arrives pov, to triage in wheelchair, reports syncope last night, states wife told pt that he "had a seizure", treated at home by EMS. Pt c/o hematuria and bilateral lower leg pain today. Pt is ambulatory with unsteady gait. Pt denies HA

## 2021-05-30 NOTE — ED Provider Notes (Signed)
[facility] Provider Note   CSN: [id number] Arrival date & time: 05/30/21  1759     History  Chief Complaint  Patient presents with   Hematuria   Altered Mental Status    Richard Gates is a 61 y.o. male.  Medical history of COPD, GERD, coronary artery disease who presents emergency department with hematuria.  Patient states that beginning Friday evening at work he felt fatigued, dizzy and felt that both of his lower extremities were swelling.  He states that the swelling continued more so on the right leg than the left since then.  He states that then last night he was folding laundry when he suddenly had a loss of consciousness.  Wife is at bedside who states that she suddenly heard a loud bang and went into the living room where the patient was on the floor having what appeared to be seizure-like activity.  She called EMS who responded.  Patient states that he only remembers waking up with EMS there and confused as to why they were there.  He denied going to the emergency department at that time.  He states that then this morning he woke up and noticed that he was having hematuria.  He denies this happening previously.  Wife does endorse an episode of similar seizure-like activity around 4 months ago however it was "not nearly as long as this."  Regarding the lower extremity edema this is a new finding.  He states that he is not have any recent immobilization, long trips, surgeries.  He does smoke.  He denies chest pain or shortness of breath.  He denies abdominal pain, nausea, vomiting, diarrhea or fevers.   Hematuria Pertinent negatives include no chest pain, no abdominal pain, no headaches and no shortness of breath.  Altered Mental Status Associated symptoms: no abdominal pain, no fever, no headaches, no light-headedness, no nausea, no palpitations and no vomiting       Home Medications Prior to Admission medications   Medication Sig Start Date End  Date Taking? Authorizing Provider  albuterol (VENTOLIN HFA) 108 (90 Base) MCG/ACT inhaler INHALE 2 PUFFS INTO THE LUNGS EVERY 6 HOURS AS NEEDED FOR WHEEZING OR SHORTNESS OF BREATH 04/13/21   Colon Branch, MD  amLODipine (NORVASC) 10 MG tablet Take 1 tablet (10 mg total) by mouth daily. 05/12/21   Colon Branch, MD  aspirin EC 81 MG tablet Take 81 mg by mouth daily. Swallow whole.    [provider]  atorvastatin (LIPITOR) 80 MG tablet Take 1 tablet (80 mg total) by mouth daily. 04/13/21   Colon Branch, MD  famotidine (PEPCID) 20 MG tablet Take 1 tablet (20 mg total) by mouth daily. 05/21/21   Noralyn Pick, NP  Glycopyrrolate-Formoterol (BEVESPI AEROSPHERE) 9-4.8 MCG/ACT AERO Inhale 2 puffs into the lungs 2 (two) times daily.    [provider]  metoprolol tartrate (LOPRESSOR) 100 MG tablet Take 1 tablet (100 mg total) by mouth 2 (two) times daily. 05/26/21   Colon Branch, MD  pantoprazole (PROTONIX) 40 MG tablet Take 1 tablet (40 mg total) by mouth daily. 05/15/21   Colon Branch, MD  sildenafil (REVATIO) 20 MG tablet Take 3-4 tablets (60-80 mg total) by mouth at bedtime as needed. 09/16/19   Colon Branch, MD      Allergies    Patient has no known allergies.    Review of Systems   Review of Systems  Constitutional:  Negative for fever.  Respiratory:  Negative for shortness of breath.   Cardiovascular:  Positive for leg swelling. Negative for chest pain and palpitations.  Gastrointestinal:  Negative for abdominal distention, abdominal pain, diarrhea, nausea and vomiting.  Genitourinary:  Positive for hematuria.  Neurological:  Positive for syncope. Negative for dizziness, light-headedness and headaches.   Physical Exam Updated Vital Signs BP 109/73    Pulse (!) 107    Temp 99 F (37.2 C) (Oral)    Resp (!) 24    Ht 5\' 6"  (1.676 m)    Wt 78.9 kg    SpO2 99%    BMI 28.08 kg/m  Physical Exam Vitals and nursing note reviewed.  Constitutional:      General: He is not in  acute distress.    Appearance: Normal appearance. He is not ill-appearing or toxic-appearing.  HENT:     Head: Normocephalic and atraumatic.     Nose: Nose normal.     Mouth/Throat:     Mouth: Mucous membranes are moist.     Pharynx: Oropharynx is clear.  Eyes:     General: No scleral icterus.    Extraocular Movements: Extraocular movements intact.     Conjunctiva/sclera: Conjunctivae normal.     Pupils: Pupils are equal, round, and reactive to light.  Cardiovascular:     Rate and Rhythm: Normal rate and regular rhythm.     Pulses: Normal pulses.     Heart sounds: Normal heart sounds. No murmur heard. Pulmonary:     Effort: Pulmonary effort is normal. No respiratory distress.     Breath sounds: Normal breath sounds.  Abdominal:     General: Bowel sounds are normal.     Palpations: Abdomen is soft.     Tenderness: There is no abdominal tenderness.  Musculoskeletal:        General: No tenderness or signs of injury. Normal range of motion.     Cervical back: Normal range of motion and neck supple. No tenderness.     Right lower leg: No tenderness. 2+ Pitting Edema present.     Left lower leg: No tenderness. 1+ Pitting Edema present.  Skin:    General: Skin is warm and dry.     Capillary Refill: Capillary refill takes less than 2 seconds.     Coloration: Skin is not jaundiced.     Findings: No rash.  Neurological:     General: No focal deficit present.     Mental Status: He is alert and oriented to person, place, and time. Mental status is at baseline.     Cranial Nerves: No cranial nerve deficit.     Sensory: No sensory deficit.  Psychiatric:        Mood and Affect: Mood normal.        Behavior: Behavior normal.        Thought Content: Thought content normal.        Judgment: Judgment normal.    ED Results / Procedures / Treatments   Labs (all labs ordered are listed, but only abnormal results are displayed) Labs Reviewed  COMPREHENSIVE METABOLIC PANEL - Abnormal;  Notable for the following components:      Result Value   Potassium 2.4 (*)    Chloride 97 (*)    Glucose, Bld 103 (*)    Creatinine, Ser 1.39 (*)    Calcium 6.2 (*)    Albumin 3.3 (*)    GFR, Estimated 58 (*)    All other components within normal limits  CBC WITH  DIFFERENTIAL/PLATELET - Abnormal; Notable for the following components:   RBC 3.63 (*)    Hemoglobin 12.1 (*)    HCT 34.9 (*)    Monocytes Absolute 1.4 (*)    All other components within normal limits  MAGNESIUM - Abnormal; Notable for the following components:   Magnesium 0.7 (*)    All other components within normal limits  URINE CULTURE  RESP PANEL BY RT-PCR (FLU A&B, COVID) ARPGX2  BRAIN NATRIURETIC PEPTIDE  URINALYSIS, ROUTINE W REFLEX MICROSCOPIC  CBG MONITORING, ED   EKG EKG Interpretation  Date/Time:  Sunday May 30 2021 19:43:16 EST Ventricular Rate:  89 PR Interval:  172 QRS Duration: 100 QT Interval:  415 QTC Calculation: 505 R Axis:   58 Text Interpretation: Sinus rhythm U waves present Confirmed by Lajean Saver [id number]) on 05/30/2021 7:45:41 PM  Radiology No results found.  Procedures .Critical Care Performed by: Mickie Hillier, PA-C Authorized by: Mickie Hillier, PA-C   Critical care provider statement:    Critical care time (minutes):  35   Critical care time was exclusive of:  Separately billable procedures and treating other patients   Critical care was necessary to treat or prevent imminent or life-threatening deterioration of the following conditions:  Metabolic crisis   Critical care was time spent personally by me on the following activities:  Development of treatment plan with patient or surrogate, discussions with primary provider, evaluation of patient's response to treatment, examination of patient, interpretation of cardiac output measurements, obtaining history from patient or surrogate, review of old charts, re-evaluation of patient's condition, pulse oximetry, ordering and  review of radiographic studies, ordering and review of laboratory studies and ordering and performing treatments and interventions   I assumed direction of critical care for this patient from another provider in my specialty: no      Medications Ordered in ED Medications  potassium chloride 10 mEq in 100 mL IVPB (has no administration in time range)  calcium gluconate 1 g in sodium chloride 0.9 % 100 mL IVPB (has no administration in time range)  magnesium sulfate IVPB 2 g 50 mL (has no administration in time range)   ED Course/ Medical Decision Making/ A&P                           Medical Decision Making Amount and/or Complexity of Data Reviewed Labs: ordered. Radiology: ordered. ECG/medicine tests: ordered.  Risk Prescription drug management.  Patient presents to the ED with complaints of syncope. This involves an extensive number of treatment options, and is a complaint that carries with it a high risk of complications and morbidity.   Additional history obtained:  Additional history obtained from: wife External records from outside source obtained and reviewed including: previous primary care visits   EKG: Sinus rhythm  U-waves present   Cardiac Monitoring: The patient was maintained on a cardiac monitor.  I personally viewed and interpreted the cardiac monitored which showed an underlying rhythm of: sinus rhythm   Lab Results: I Ordered, reviewed, and interpreted labs. Pertinent results include: CBC with normocytic anemia of 12.1, similar to previous  CMP with critical K of 2.4, critical Calcium 6.2 Creatinine 1.39, decreased from previous value, however, appears baseline is ~1  Magnesium critical 0.7  BNP 59.6  COVID and flu pending  UA pending   Imaging Studies ordered:  I ordered imaging studies which included CT head and CXR, bilateral venous DVT study of lower extremities.  I independently reviewed & interpreted imaging & am in agreement with radiology  impression. Imaging shows:  Medications  I ordered medication including potassium, calcium, magnesium for electrolyte replacement  Reevaluation of the patient after medication shows that patient obtaining replacement at time of hand off   Tests Considered:   Critical Interventions: Electrolyte replacement    ED Course: 61 year old male who presents to ED with complaint of seizure-like activity last night, lower extremity swelling, and hematuria. History, PE and work-up as described above.  Patient care being handed off to [facility], PA-C at this time in work up. At hand-off, patient has critical electrolyte disturbances that are all currently being replaced IV. Unclear etiology of hypokalemia, hypocalcemia, hypomagnesemia at this time. EKG With u-waves. He is received bilateral venous duplex for LE swelling. BNP 59.6, doubt CHF at this time, however also obtaining CXR. CT head for seizure-like activity pending  UA pending - placed order for in and out cath if unable to spontaneously. Also ordered bladder scan.  Patient will require admission for critical electrolyte disturbances. Final admission, disposition, diagnosis pending complete work up here in ED.   Dispostion: After consideration of the diagnostic results and the patients response to treatment, I feel that the patent would benefit from admission. The patient has been appropriately medically screened and/or stabilized in the ED. I have low suspicion for any other emergent medical condition which would require further screening, evaluation or treatment in the ED or require inpatient management   Final Clinical Impression(s) / ED Diagnoses Final diagnoses:  Syncope and collapse    Rx / DC Orders ED Discharge Orders     None         Mickie Hillier, PA-C 05/30/21 2004    Lajean Saver, MD 05/31/21 1506

## 2021-05-31 ENCOUNTER — Inpatient Hospital Stay (HOSPITAL_COMMUNITY): Payer: 59

## 2021-05-31 ENCOUNTER — Inpatient Hospital Stay: Payer: 59

## 2021-05-31 ENCOUNTER — Inpatient Hospital Stay (HOSPITAL_COMMUNITY)
Admit: 2021-05-31 | Discharge: 2021-05-31 | Disposition: A | Discharge status: Home / Self Care | Payer: 59 | Attending: Family Medicine | Admitting: Family Medicine

## 2021-05-31 ENCOUNTER — Inpatient Hospital Stay: Payer: 59 | Admitting: Family

## 2021-05-31 DIAGNOSIS — R569 Unspecified convulsions: Secondary | ICD-10-CM

## 2021-05-31 DIAGNOSIS — R609 Edema, unspecified: Secondary | ICD-10-CM

## 2021-05-31 DIAGNOSIS — R55 Syncope and collapse: Secondary | ICD-10-CM

## 2021-05-31 LAB — PROTIME-INR
INR: 1.1 (ref 0.8–1.2)
Prothrombin Time: 13.9 seconds (ref 11.4–15.2)

## 2021-05-31 LAB — CBC
HCT: 32.5 % — ABNORMAL LOW (ref 39.0–52.0)
Hemoglobin: 10.9 g/dL — ABNORMAL LOW (ref 13.0–17.0)
MCH: 33 pg (ref 26.0–34.0)
MCHC: 33.5 g/dL (ref 30.0–36.0)
MCV: 98.5 fL (ref 80.0–100.0)
Platelets: 244 10*3/uL (ref 150–400)
RBC: 3.3 MIL/uL — ABNORMAL LOW (ref 4.22–5.81)
RDW: 13.4 % (ref 11.5–15.5)
WBC: 9 10*3/uL (ref 4.0–10.5)
nRBC: 0 % (ref 0.0–0.2)

## 2021-05-31 LAB — BASIC METABOLIC PANEL
Anion gap: 12 (ref 5–15)
BUN: 11 mg/dL (ref 8–23)
CO2: 24 mmol/L (ref 22–32)
Calcium: 6.9 mg/dL — ABNORMAL LOW (ref 8.9–10.3)
Chloride: 101 mmol/L (ref 98–111)
Creatinine, Ser: 1.1 mg/dL (ref 0.61–1.24)
GFR, Estimated: >60 mL/min (ref >60)
Glucose, Bld: 120 mg/dL — ABNORMAL HIGH (ref 70–99)
Potassium: 2.3 mmol/L — CL (ref 3.5–5.1)
Sodium: 137 mmol/L (ref 135–145)

## 2021-05-31 LAB — HIV ANTIBODY (ROUTINE TESTING W REFLEX): HIV Screen 4th Generation wRfx: NONREACTIVE

## 2021-05-31 LAB — MAGNESIUM: Magnesium: 2.9 mg/dL — ABNORMAL HIGH (ref 1.7–2.4)

## 2021-05-31 LAB — PHOSPHORUS: Phosphorus: 2.7 mg/dL (ref 2.5–4.6)

## 2021-05-31 LAB — POTASSIUM: Potassium: 3.3 mmol/L — ABNORMAL LOW (ref 3.5–5.1)

## 2021-05-31 LAB — APTT: aPTT: 40 seconds — ABNORMAL HIGH (ref 24–36)

## 2021-05-31 MED ORDER — POTASSIUM CHLORIDE 10 MEQ/100ML IV SOLN
10.0000 meq | INTRAVENOUS | Status: AC
  Administered 2021-05-31 (×6): 10 meq via INTRAVENOUS
  Filled 2021-05-31 (×6): qty 100

## 2021-05-31 MED ORDER — LIDOCAINE 5 % EX PTCH
1.0000 | MEDICATED_PATCH | CUTANEOUS | Status: DC
  Administered 2021-05-31 – 2021-06-01 (×2): 1 via TRANSDERMAL
  Filled 2021-05-31 (×3): qty 1

## 2021-05-31 MED ORDER — CYCLOBENZAPRINE HCL 5 MG PO TABS
5.0000 mg | ORAL_TABLET | Freq: Two times a day (BID) | ORAL | Status: DC | PRN
  Administered 2021-05-31: 5 mg via ORAL
  Filled 2021-05-31: qty 1

## 2021-05-31 MED ORDER — POTASSIUM CHLORIDE 20 MEQ PO PACK
40.0000 meq | PACK | Freq: Once | ORAL | Status: AC
Start: 2021-05-31 — End: 2021-05-31
  Administered 2021-05-31: 40 meq via ORAL
  Filled 2021-05-31: qty 2

## 2021-05-31 MED ORDER — HEPARIN BOLUS VIA INFUSION
1500.0000 [IU] | Freq: Once | INTRAVENOUS | Status: AC
  Administered 2021-05-31: 1500 [IU] via INTRAVENOUS
  Filled 2021-05-31: qty 1500

## 2021-05-31 MED ORDER — HEPARIN (PORCINE) 25000 UT/250ML-% IV SOLN
1250.0000 [IU]/h | INTRAVENOUS | Status: AC
  Administered 2021-05-31: 1250 [IU]/h via INTRAVENOUS
  Filled 2021-05-31: qty 250

## 2021-05-31 MED ORDER — MORPHINE SULFATE (PF) 2 MG/ML IV SOLN
1.0000 mg | Freq: Once | INTRAVENOUS | Status: AC
  Administered 2021-05-31: 1 mg via INTRAVENOUS
  Filled 2021-05-31: qty 1

## 2021-05-31 NOTE — Progress Notes (Signed)
Left lower extremity venous duplex completed. Refer to "CV Proc" under chart review to view preliminary results.  Preliminary results discussed with Dr. Dwyane Dee.   05/31/2021 4:04 PM Kelby Aline., MHA, RVT, RDCS, RDMS  p

## 2021-05-31 NOTE — Procedures (Signed)
Patient Name: Richard Gates  MRN: [id number]  Epilepsy Attending: Lora Havens  Referring Physician/Provider: Vianne Bulls, MD Date: 05/31/2021 Duration: 22.18 mins  Patient history: 61yo M with seizure like activity. EEG to evaluate for seizure  Level of alertness: Awake AEDs during EEG study: None  Technical aspects: This EEG study was done with scalp electrodes positioned according to the 10-20 International system of electrode placement. Electrical activity was acquired at a sampling rate of 500Hz  and reviewed with a high frequency filter of 70Hz  and a low frequency filter of 1Hz . EEG data were recorded continuously and digitally stored.   Description: The posterior dominant rhythm consists of 9Hz  activity of moderate voltage (25-35 uV) seen predominantly in posterior head regions, symmetric and reactive to eye opening and eye closing. Physiologic photic driving was seen during photic stimulation. Hyperventilation was not performed.     IMPRESSION: This study is within normal limits. No seizures or epileptiform discharges were seen throughout the recording.  Baptiste Littler Barbra Sarks

## 2021-05-31 NOTE — Progress Notes (Addendum)
PROGRESS NOTE    Richard Gates  BOF:[id number] DOB: 1960/10/06 DOA: 05/30/2021 PCP: Colon Branch, MD   Brief Narrative:  This 61 years old male with  PMH significant for hypertension, COPD, coronary artery calcification presented in the ED after an episode of seizure-like activity last night.  Patient reports that he has recently developed bilateral leg swelling and was feeling dizzy and lightheaded.  Last night he suffered  loss of consciousness.  His wife heard him fall and came to check on him and reported both his arms and legs were shaking and he had some drooling.  EMS was called but patient regained awareness after EMS arrived.  Patient refused transport to the ED at that time.  Patient has been following up with GI for last 4 months for watery diarrhea without abdominal pain and bleeding.  Patient also reports drinking alcohol on weekends.  CT head negative for any intracranial abnormality.  Venous duplex of right lower extremity negative for DVT.   EEG and MRI is ordered.  Assessment & Plan:   Principal Problem:   Observed seizure-like activity (HCC) Active Problems:   COPD mixed type (HCC)   Hypertension   Hypokalemia   Hypomagnesemia   Hypocalcemia   Renal insufficiency   Diarrhea   Prolonged QT interval  Seizure-like activity: Patient presented after generalized seizures like activity. CT head no acute findings. He had recently quit drinking but his last drink was 2 weeks ago. Likely from critical electrolyte derangements. Replace electrolytes.  EEG:  No evidence of seizures. MRI : No acute infarct.  Electrolyte derangements(hypokalemia, hypomagnesemia, hypocalcemia) Serum potassium 2.4, magnesium 0.7 calcium 6.2 albumin 3.3. POA Replaced.  Continue to monitor. Likely from watery diarrhea and possibly from recent poor p.o. intake. Recheck labs improving.  Acute kidney injury: Serum creatinine 1.3 on admission, baseline unclear. Avoid nephrotoxic medications.   Monitor serum creatinine.  Leg swelling: Patient has bilateral pitting edema. No DVT on right lower extremity ultrasound. Elevate legs, consider stopping Norvasc  Essential hypertension: Continue Norvasc and metoprolol.  CAD: Denies any chest pain,  shortness of breath or palpitations. Continue aspirin, Lipitor and metoprolol.  Prolonged QT interval: Continue cardiac monitoring, correct electrolytes and avoid QT prolonging medications.  Diarrhea: Patient reports 57-month history of watery diarrhea.  C. difficile negative last month. She is following up with GI,  remains on bland diet and taking Imodium as needed   DVT prophylaxis: Lovenox Code Status: Full code Family Communication: No family at bedside Disposition Plan:  Status is: Inpatient  Remains inpatient appropriate because:   Admitted for seizure-like activity MRI EEG is pending.   Consultants:  None  Procedures: MRI and EEG Antimicrobials:  Anti-infectives (From admission, onward)    None      Subjective: Patient was seen and examined at bedside.  Overnight events noted.   Patient reports feeling better,  reports diarrhea is improving but feels generalized weakness.   Right leg is swollen but is negative for DVT.  Patient is undergoing EEG.  Objective: Vitals:   05/30/21 2303 05/31/21 0317 05/31/21 0356 05/31/21 1423  BP: (!) 158/72 (!) 109/59  115/64  Pulse: (!) 102 96  79  Resp: 20 20    Temp: 98.5 F (36.9 C) 99 F (37.2 C)  98.3 F (36.8 C)  TempSrc: Oral Oral  Oral  SpO2: 99% 93%  98%  Weight:   74.3 kg   Height:        Intake/Output Summary (Last 24 hours) at 05/31/2021  [place] filed at 05/31/2021 0947 Gross per 24 hour  Intake 1831.83 ml  Output 1125 ml  Net 706.83 ml   Filed Weights   05/30/21 1810 05/31/21 0356  Weight: 78.9 kg 74.3 kg    Examination:  General exam: Appears comfortable, not in any acute distress. Respiratory system: Clear to auscultation  bilaterally, respiratory effort normal, RR 15 Cardiovascular system: S1 & S2 heard, regular rate and rhythm, no murmur. Gastrointestinal system: Abdomen is soft, nontender, nondistended, BS+ Central nervous system: Alert and oriented x 3. No focal neurological deficits. Extremities: Edema+, no cyanosis no clubbing. Skin: No rashes, lesions or ulcers Psychiatry: Judgement and insight appear normal. Mood & affect appropriate.     Data Reviewed: I have personally reviewed following labs and imaging studies  CBC: Recent Labs  Lab 05/30/21 1900 05/31/21 0458  WBC 10.0 9.0  NEUTROABS 7.2  --   HGB 12.1* 10.9*  HCT 34.9* 32.5*  MCV 96.1 98.5  PLT 239 174   Basic Metabolic Panel: Recent Labs  Lab 05/30/21 1900 05/31/21 0458  NA 136 137  K 2.4* 2.3*  CL 97* 101  CO2 26 24  GLUCOSE 103* 120*  BUN 15 11  CREATININE 1.39* 1.10  CALCIUM 6.2* 6.9*  MG 0.7* 2.9*  PHOS  --  2.7   GFR: Estimated Creatinine Clearance: 63.6 mL/min (by C-G formula based on SCr of 1.1 mg/dL). Liver Function Tests: Recent Labs  Lab 05/30/21 1900  AST 41  ALT 24  ALKPHOS 121  BILITOT 0.6  PROT 7.8  ALBUMIN 3.3*   No results for input(s): LIPASE, AMYLASE in the last 168 hours. No results for input(s): AMMONIA in the last 168 hours. Coagulation Profile: No results for input(s): INR, PROTIME in the last 168 hours. Cardiac Enzymes: No results for input(s): CKTOTAL, CKMB, CKMBINDEX, TROPONINI in the last 168 hours. BNP (last 3 results) No results for input(s): PROBNP in the last 8760 hours. HbA1C: No results for input(s): HGBA1C in the last 72 hours. CBG: Recent Labs  Lab 05/30/21 1827  GLUCAP 99   Lipid Profile: No results for input(s): CHOL, HDL, LDLCALC, TRIG, CHOLHDL, LDLDIRECT in the last 72 hours. Thyroid Function Tests: No results for input(s): TSH, T4TOTAL, FREET4, T3FREE, THYROIDAB in the last 72 hours. Anemia Panel: No results for input(s): VITAMINB12, FOLATE, FERRITIN, TIBC,  IRON, RETICCTPCT in the last 72 hours. Sepsis Labs: No results for input(s): PROCALCITON, LATICACIDVEN in the last 168 hours.  Recent Results (from the past 240 hour(s))  Resp Panel by RT-PCR (Flu A&B, Covid) Nasopharyngeal Swab     Status: None   Collection Time: 05/30/21  7:47 PM   Specimen: Nasopharyngeal Swab; Nasopharyngeal(NP) swabs in vial transport medium  Result Value Ref Range Status   SARS Coronavirus 2 by RT PCR NEGATIVE NEGATIVE Final    Comment: (NOTE) SARS-CoV-2 target nucleic acids are NOT DETECTED.  The SARS-CoV-2 RNA is generally detectable in upper respiratory specimens during the acute phase of infection. The lowest concentration of SARS-CoV-2 viral copies this assay can detect is 138 copies/mL. A negative result does not preclude SARS-Cov-2 infection and should not be used as the sole basis for treatment or other patient management decisions. A negative result may occur with  improper specimen collection/handling, submission of specimen other than nasopharyngeal swab, presence of viral mutation(s) within the areas targeted by this assay, and inadequate number of viral copies(<138 copies/mL). A negative result must be combined with clinical observations, patient history, and epidemiological information. The expected  result is Negative.  Fact Sheet for Patients:  EntrepreneurPulse.com.au  Fact Sheet for Healthcare Providers:  IncredibleEmployment.be  This test is no t yet approved or cleared by the [place] FDA and  has been authorized for detection and/or diagnosis of SARS-CoV-2 by FDA under an Emergency Use Authorization (EUA). This EUA will remain  in effect (meaning this test can be used) for the duration of the COVID-19 declaration under Section 564(b)(1) of the Act, 21 U.S.C.section 360bbb-3(b)(1), unless the authorization is terminated  or revoked sooner.       Influenza A by PCR NEGATIVE NEGATIVE Final    Influenza B by PCR NEGATIVE NEGATIVE Final    Comment: (NOTE) The Xpert Xpress SARS-CoV-2/FLU/RSV plus assay is intended as an aid in the diagnosis of influenza from Nasopharyngeal swab specimens and should not be used as a sole basis for treatment. Nasal washings and aspirates are unacceptable for Xpert Xpress SARS-CoV-2/FLU/RSV testing.  Fact Sheet for Patients: EntrepreneurPulse.com.au  Fact Sheet for Healthcare Providers: IncredibleEmployment.be  This test is not yet approved or cleared by the [place] FDA and has been authorized for detection and/or diagnosis of SARS-CoV-2 by FDA under an Emergency Use Authorization (EUA). This EUA will remain in effect (meaning this test can be used) for the duration of the COVID-19 declaration under Section 564(b)(1) of the Act, 21 U.S.C. section 360bbb-3(b)(1), unless the authorization is terminated or revoked.  Performed at [facility], [address] [place], [place] [address]    Radiology Studies: CT Head Wo Contrast  Result Date: 05/30/2021 CLINICAL DATA:  Syncopal episode possible seizure. EXAM: CT HEAD WITHOUT CONTRAST TECHNIQUE: Contiguous axial images were obtained from the base of the skull through the vertex without intravenous contrast. RADIATION DOSE REDUCTION: This exam was performed according to the departmental dose-optimization program which includes automated exposure control, adjustment of the mA and/or kV according to patient size and/or use of iterative reconstruction technique. COMPARISON:  None. FINDINGS: Brain: No evidence of acute infarction, hemorrhage, hydrocephalus, extra-axial collection or mass lesion/mass effect. Vascular: No hyperdense vessel or unexpected calcification. Skull: Normal. Negative for fracture or focal lesion. Sinuses/Orbits: The orbits and orbital contents are unremarkable. There is deformity of the nasal bone consistent with a likely chronic  fracture. There is mild membrane thickening in the ethmoid air cells and maxillary sinuses without fluid level. Other sinuses are clear. There is trace fluid in both mastoid tips, with otherwise clear mastoid airspaces. Other: None. IMPRESSION: 1. No acute intracranial CT findings. 2. Sinus membrane disease. 3. Trace mastoid effusions. 4. Deformity of the nasal bone likely chronic. Correlate clinically for point tenderness or recent trauma. Electronically Signed   By: Telford Nab M.D.   On: 05/30/2021 20:03   MR BRAIN WO CONTRAST  Result Date: 05/31/2021 CLINICAL DATA:  Seizure, new onset.  No trauma history. EXAM: MRI HEAD WITHOUT CONTRAST TECHNIQUE: Multiplanar, multiecho pulse sequences of the brain and surrounding structures were obtained without intravenous contrast. COMPARISON:  Head CT from yesterday FINDINGS: Truncated study at patient request, only diffusion, sagittal T1, axial T2, and gradient images were acquired. No acute infarct, hemorrhage, hydrocephalus, or visible collection. No focal cortical finding to explain seizure. Chronic lacune or dilated perivascular spaces at the bilateral thalamus and right internal capsule. Unremarkable orbits and sinuses. Hypointense appearance at the C3 and C4 vertebral bodies, presence on both sides of the narrow disc space suggesting degenerative changes. IMPRESSION: 1. Truncated study due to patient request. 2. No acute finding including infarct.  Electronically Signed   By: Jorje Guild M.D.   On: 05/31/2021 07:24   US Venous Img Lower Right (DVT Study)  Result Date: 05/30/2021 CLINICAL DATA:  Right leg pain and swelling for 2 days EXAM: RIGHT LOWER EXTREMITY VENOUS DOPPLER ULTRASOUND TECHNIQUE: Gray-scale sonography with graded compression, as well as color Doppler and duplex ultrasound were performed to evaluate the lower extremity deep venous systems from the level of the common femoral vein and including the common femoral, femoral, profunda  femoral, popliteal and calf veins including the posterior tibial, peroneal and gastrocnemius veins when visible. The superficial great saphenous vein was also interrogated. Spectral Doppler was utilized to evaluate flow at rest and with distal augmentation maneuvers in the common femoral, femoral and popliteal veins. COMPARISON:  None. FINDINGS: Contralateral Common Femoral Vein: Respiratory phasicity is normal and symmetric with the symptomatic side. No evidence of thrombus. Normal compressibility. Common Femoral Vein: No evidence of thrombus. Normal compressibility, respiratory phasicity and response to augmentation. Saphenofemoral Junction: No evidence of thrombus. Normal compressibility and flow on color Doppler imaging. Profunda Femoral Vein: No evidence of thrombus. Normal compressibility and flow on color Doppler imaging. Femoral Vein: No evidence of thrombus. Normal compressibility, respiratory phasicity and response to augmentation. Popliteal Vein: No evidence of thrombus. Normal compressibility, respiratory phasicity and response to augmentation. Calf Veins: No evidence of thrombus. Normal compressibility and flow on color Doppler imaging. Superficial Great Saphenous Vein: No evidence of thrombus. Normal compressibility. Venous Reflux:  None. Other Findings:  None. IMPRESSION: No evidence of deep venous thrombosis. Electronically Signed   By: Inez Catalina M.D.   On: 05/30/2021 20:07   DG Chest Portable 1 View  Result Date: 05/30/2021 CLINICAL DATA:  le edema, sob EXAM: PORTABLE CHEST 1 VIEW COMPARISON:  CT chest 06/01/2020 FINDINGS: The heart and mediastinal contours are within normal limits. Atherosclerotic plaque. No focal consolidation. No pulmonary edema. No pleural effusion. No pneumothorax. No acute osseous abnormality.  Old healed right rib fracture. IMPRESSION: 1. No active disease. 2.  Aortic Atherosclerosis (ICD10-I70.0). Electronically Signed   By: Iven Finn M.D.   On: 05/30/2021 20:34    EEG adult  Result Date: 05/31/2021 Lora Havens, MD     05/31/2021 11:06 AM Patient Name: Richard Gates MRN: [id number] Epilepsy Attending: Lora Havens Referring Physician/Provider: Vianne Bulls, MD Date: 05/31/2021 Duration: 22.18 mins Patient history: 61yo M with seizure like activity. EEG to evaluate for seizure Level of alertness: Awake AEDs during EEG study: None Technical aspects: This EEG study was done with scalp electrodes positioned according to the 10-20 International system of electrode placement. Electrical activity was acquired at a sampling rate of 500Hz  and reviewed with a high frequency filter of 70Hz  and a low frequency filter of 1Hz . EEG data were recorded continuously and digitally stored. Description: The posterior dominant rhythm consists of 9Hz  activity of moderate voltage (25-35 uV) seen predominantly in posterior head regions, symmetric and reactive to eye opening and eye closing. Physiologic photic driving was seen during photic stimulation. Hyperventilation was not performed.   IMPRESSION: This study is within normal limits. No seizures or epileptiform discharges were seen throughout the recording. Priyanka Barbra Sarks    Scheduled Meds:  amLODipine  10 mg Oral Daily   arformoterol  15 mcg Nebulization BID   And   umeclidinium bromide  1 puff Inhalation Daily   aspirin EC  81 mg Oral Daily   atorvastatin  80 mg Oral Daily   enoxaparin (LOVENOX) injection  40 mg Subcutaneous Q24H   famotidine  20 mg Oral Daily   lidocaine  1 patch Transdermal Q24H   metoprolol tartrate  100 mg Oral BID   sodium chloride flush  3 mL Intravenous Q12H   Continuous Infusions:     LOS: 1 day    Time spent:  [place], MD [facility]   If 7PM-7AM, please contact night-coverage

## 2021-05-31 NOTE — Progress Notes (Signed)
EEG completed, results pending. 

## 2021-05-31 NOTE — Plan of Care (Signed)
°  Problem: Education: Goal: Knowledge of General Education information will improve Description: Including pain rating scale, medication(s)/side effects and non-pharmacologic comfort measures Outcome: Progressing   Problem: Clinical Measurements: Goal: Ability to maintain clinical measurements within normal limits will improve Outcome: Progressing Goal: Will remain free from infection Outcome: Progressing Goal: Diagnostic test results will improve Outcome: Progressing Goal: Respiratory complications will improve Outcome: Progressing Goal: Cardiovascular complication will be avoided Outcome: Progressing   Problem: Elimination: Goal: Will not experience complications related to bowel motility Outcome: Progressing Goal: Will not experience complications related to urinary retention Outcome: Progressing   Problem: Safety: Goal: Ability to remain free from injury will improve Outcome: Progressing

## 2021-05-31 NOTE — Progress Notes (Signed)
Potassium of 2.3 reported to On- Call provider.

## 2021-05-31 NOTE — Progress Notes (Signed)
ANTICOAGULATION CONSULT NOTE - Initial Consult  Pharmacy Consult for Heparin Indication: DVT  No Known Allergies  Patient Measurements: Height: 5\' 6"  (167.6 cm) Weight: 74.3 kg (163 lb 12.8 oz) IBW/kg (Calculated) : 63.8 Heparin Dosing Weight: 74.3 kg  Vital Signs: Temp: 98.3 F (36.8 C) (01/30 1423) Temp Source: Oral (01/30 1423) BP: 115/64 (01/30 1423) Pulse Rate: 79 (01/30 1423)  Labs: Recent Labs    05/30/21 1900 05/31/21 0458  HGB 12.1* 10.9*  HCT 34.9* 32.5*  PLT 239 244  CREATININE 1.39* 1.10    Estimated Creatinine Clearance: 63.6 mL/min (by C-G formula based on SCr of 1.1 mg/dL).   Medical History: Past Medical History:  Diagnosis Date   COPD (chronic obstructive pulmonary disease) ([place])    Coronary artery calcification seen on CAT scan 08/2019   Coronary calcium score 103; short LM with<25% mixed calcific plaque (minimal); aortic atherosclerosis with normal size.  No dissection.  No aortic valve calcification   GERD (gastroesophageal reflux disease)    Hyperlipidemia    Hypertension     Medications:  Scheduled:   amLODipine  10 mg Oral Daily   arformoterol  15 mcg Nebulization BID   And   umeclidinium bromide  1 puff Inhalation Daily   aspirin EC  81 mg Oral Daily   atorvastatin  80 mg Oral Daily   enoxaparin (LOVENOX) injection  40 mg Subcutaneous Q24H   famotidine  20 mg Oral Daily   lidocaine  1 patch Transdermal Q24H   metoprolol tartrate  100 mg Oral BID   potassium chloride  40 mEq Oral Once   sodium chloride flush  3 mL Intravenous Q12H   Infusions:  PRN: acetaminophen **OR** acetaminophen, albuterol, loperamide  Assessment: 61 yo male with HTN, COPD, CAD who presented after an episode of seizure-like activity.  Patient noted to have LE swelling, doppler positive for acute DVT in left popliteal vein.  Pharmacy consulted to dose IV heparin.  Received lovenox 40mg  at 09:31 this morning.  Goal of Therapy:  Heparin level 0.3-0.7  units/ml Monitor platelets by anticoagulation protocol: Yes   Plan:  Give 1500 units bolus x 1 Start heparin infusion at 1250 units/hr Check anti-Xa level in 8 hours and daily while on heparin Continue to monitor H&H and platelets  Peggyann Juba, PharmD, BCPS Pharmacy: [phone] 05/31/2021,4:16 PM

## 2021-06-01 LAB — CBC
HCT: 33.3 % — ABNORMAL LOW (ref 39.0–52.0)
Hemoglobin: 10.8 g/dL — ABNORMAL LOW (ref 13.0–17.0)
MCH: 32.7 pg (ref 26.0–34.0)
MCHC: 32.4 g/dL (ref 30.0–36.0)
MCV: 100.9 fL — ABNORMAL HIGH (ref 80.0–100.0)
Platelets: 263 10*3/uL (ref 150–400)
RBC: 3.3 MIL/uL — ABNORMAL LOW (ref 4.22–5.81)
RDW: 13.6 % (ref 11.5–15.5)
WBC: 8.3 10*3/uL (ref 4.0–10.5)
nRBC: 0 % (ref 0.0–0.2)

## 2021-06-01 LAB — BASIC METABOLIC PANEL
Anion gap: 9 (ref 5–15)
BUN: 9 mg/dL (ref 8–23)
CO2: 22 mmol/L (ref 22–32)
Calcium: 7.5 mg/dL — ABNORMAL LOW (ref 8.9–10.3)
Chloride: 104 mmol/L (ref 98–111)
Creatinine, Ser: 0.8 mg/dL (ref 0.61–1.24)
GFR, Estimated: >60 mL/min (ref >60)
Glucose, Bld: 115 mg/dL — ABNORMAL HIGH (ref 70–99)
Potassium: 3.3 mmol/L — ABNORMAL LOW (ref 3.5–5.1)
Sodium: 135 mmol/L (ref 135–145)

## 2021-06-01 LAB — URINE CULTURE: Culture: NO GROWTH

## 2021-06-01 LAB — HEPARIN LEVEL (UNFRACTIONATED)
Heparin Unfractionated: 0.35 IU/mL (ref 0.30–0.70)
Heparin Unfractionated: 0.51 IU/mL (ref 0.30–0.70)

## 2021-06-01 LAB — CALCIUM, IONIZED: Calcium, Ionized, Serum: 4 mg/dL — ABNORMAL LOW (ref 4.5–5.6)

## 2021-06-01 LAB — PHOSPHORUS: Phosphorus: 1.9 mg/dL — ABNORMAL LOW (ref 2.5–4.6)

## 2021-06-01 LAB — MAGNESIUM: Magnesium: 1.9 mg/dL (ref 1.7–2.4)

## 2021-06-01 MED ORDER — APIXABAN 5 MG PO TABS
10.0000 mg | ORAL_TABLET | Freq: Two times a day (BID) | ORAL | Status: DC
  Administered 2021-06-01 – 2021-06-02 (×2): 10 mg via ORAL
  Filled 2021-06-01 (×2): qty 2

## 2021-06-01 MED ORDER — POTASSIUM PHOSPHATES 15 MMOLE/5ML IV SOLN
15.0000 mmol | Freq: Once | INTRAVENOUS | Status: AC
  Administered 2021-06-01: 15 mmol via INTRAVENOUS
  Filled 2021-06-01: qty 5

## 2021-06-01 MED ORDER — ENOXAPARIN SODIUM 80 MG/0.8ML IJ SOSY
75.0000 mg | PREFILLED_SYRINGE | Freq: Two times a day (BID) | INTRAMUSCULAR | Status: DC
  Administered 2021-06-01: 75 mg via SUBCUTANEOUS
  Filled 2021-06-01: qty 0.8

## 2021-06-01 MED ORDER — APIXABAN 5 MG PO TABS: 5.0000 mg | ORAL_TABLET | Freq: Two times a day (BID) | ORAL | Status: DC

## 2021-06-01 MED ORDER — HYDRALAZINE HCL 25 MG PO TABS
25.0000 mg | ORAL_TABLET | Freq: Three times a day (TID) | ORAL | Status: DC
  Administered 2021-06-01 – 2021-06-02 (×5): 25 mg via ORAL
  Filled 2021-06-01 (×5): qty 1

## 2021-06-01 NOTE — Progress Notes (Signed)
ANTICOAGULATION CONSULT NOTE   Pharmacy Consult for Heparin Indication: DVT  No Known Allergies  Patient Measurements: Height: 5\' 6"  (167.6 cm) Weight: 74.3 kg (163 lb 12.8 oz) IBW/kg (Calculated) : 63.8 Heparin Dosing Weight: 74.3 kg  Vital Signs: Temp: 98.4 F (36.9 C) (01/30 2022) Temp Source: Oral (01/30 2022) BP: 128/68 (01/30 2022) Pulse Rate: 77 (01/30 2022)  Labs: Recent Labs    05/30/21 1900 05/31/21 0458 05/31/21 1651 06/01/21 0007  HGB 12.1* 10.9*  --  10.8*  HCT 34.9* 32.5*  --  33.3*  PLT 239 244  --  263  APTT  --   --  40*  --   LABPROT  --   --  13.9  --   INR  --   --  1.1  --   HEPARINUNFRC  --   --   --  0.51  CREATININE 1.39* 1.10  --   --      Estimated Creatinine Clearance: 63.6 mL/min (by C-G formula based on SCr of 1.1 mg/dL).   Medical History: Past Medical History:  Diagnosis Date   COPD (chronic obstructive pulmonary disease) ([place])    Coronary artery calcification seen on CAT scan 08/2019   Coronary calcium score 103; short LM with<25% mixed calcific plaque (minimal); aortic atherosclerosis with normal size.  No dissection.  No aortic valve calcification   GERD (gastroesophageal reflux disease)    Hyperlipidemia    Hypertension     Medications:  Scheduled:   amLODipine  10 mg Oral Daily   arformoterol  15 mcg Nebulization BID   And   umeclidinium bromide  1 puff Inhalation Daily   aspirin EC  81 mg Oral Daily   atorvastatin  80 mg Oral Daily   famotidine  20 mg Oral Daily   lidocaine  1 patch Transdermal Q24H   metoprolol tartrate  100 mg Oral BID   sodium chloride flush  3 mL Intravenous Q12H   Infusions:   heparin 1,250 Units/hr (05/31/21 1706)   PRN: acetaminophen **OR** acetaminophen, albuterol, cyclobenzaprine, loperamide  Assessment: 61 yo male with HTN, COPD, CAD who presented after an episode of seizure-like activity.  Patient noted to have LE swelling, doppler positive for acute DVT in left popliteal vein.   Pharmacy consulted to dose IV heparin.  Received lovenox 40mg  at 09:31 05/31/2021  06/01/21 Heparin level = 0.51 (therapeutic) with heparin gtt @ 1250 units/hr Hgb = 10.8 (low, stable), PLTC wnl No complications of therapy noted  Goal of Therapy:  Heparin level 0.3-0.7 units/ml Monitor platelets by anticoagulation protocol: Yes   Plan:  Continue IV heparin gtt @ 1250 units/hr Repeat heparin level in 6 hr to confirm therapeutic dose Monitor for signs/symptoms of bleeding Daily heparin level and CBC  Leone Haven, PharmD 06/01/2021,12:37 AM

## 2021-06-01 NOTE — Plan of Care (Signed)
°  Problem: Education: Goal: Knowledge of General Education information will improve Description: Including pain rating scale, medication(s)/side effects and non-pharmacologic comfort measures Outcome: Progressing   Problem: Clinical Measurements: Goal: Ability to maintain clinical measurements within normal limits will improve Outcome: Progressing Goal: Will remain free from infection Outcome: Progressing Goal: Diagnostic test results will improve Outcome: Progressing Goal: Respiratory complications will improve Outcome: Progressing Goal: Cardiovascular complication will be avoided Outcome: Progressing   Problem: Elimination: Goal: Will not experience complications related to bowel motility Outcome: Progressing Goal: Will not experience complications related to urinary retention Outcome: Progressing   Problem: Safety: Goal: Ability to remain free from injury will improve Outcome: Progressing

## 2021-06-01 NOTE — Progress Notes (Signed)
PROGRESS NOTE    Richard Gates  OJJ:[id number] DOB: [birth year] DOA: 05/30/2021 PCP: Colon Branch, MD   Brief Narrative:  This 61 years old male with  PMH significant for hypertension, COPD, coronary artery calcification presented in the ED after an episode of seizure-like activity last night.  Patient reports that he has recently developed bilateral leg swelling and was feeling dizzy and lightheaded.  Last night he suffered  loss of consciousness.  His wife heard him fall and came to check on him and reported both his arms and legs were shaking and he had some drooling.  EMS was called but patient regained awareness after EMS arrived.  Patient refused transport to the ED at that time.  Patient has been following up with GI for last 4 months for watery diarrhea without abdominal pain and bleeding.  Patient also reports drinking alcohol on weekends.  CT head negative for any intracranial abnormality.  Venous duplex of right lower extremity negative for DVT.   EEG and MRI  are unremarkable.  Left lower extremity venous duplex positive for DVT,  started on heparin.  Assessment & Plan:   Principal Problem:   Observed seizure-like activity (HCC) Active Problems:   COPD mixed type (HCC)   Hypertension   Hypokalemia   Hypomagnesemia   Hypocalcemia   Renal insufficiency   Diarrhea   Prolonged QT interval  Seizure-like activity : Patient presented after generalized seizures like activity. CT head no acute findings. He had recently quit drinking but his last drink was 2 weeks ago. Likely from critical electrolyte derangements. Replace electrolytes.  EEG:  No evidence of seizures. MRI : No acute infarct.  Electrolyte derangements(hypokalemia, hypomagnesemia, hypocalcemia) Serum potassium 2.4, magnesium 0.7 calcium 6.2 albumin 3.3. POA Replaced.  Continue to monitor. Potassium 3.3 Likely from watery diarrhea and possibly from recent poor p.o. intake. Recheck labs improving.  Acute kidney  injury: > Resolved. Serum creatinine 1.3 on admission, baseline unclear. Avoid nephrotoxic medications.  Monitor serum creatinine.  Leg swelling > Left leg DVT: Patient has bilateral pitting edema. No DVT on right lower extremity ultrasound. Elevate legs, consider stopping Norvasc. Left leg venous duplex positive for DVT,  started on Lovenox. Plan to transition to Eliquis before discharge  Essential hypertension: Continue Norvasc and metoprolol.  CAD: Denies any chest pain,  shortness of breath or palpitations. Continue aspirin, Lipitor and metoprolol.  Prolonged QT interval: Continue cardiac monitoring, correct electrolytes and avoid QT prolonging medications.  Diarrhea: Patient reports 52-month history of watery diarrhea.  C. difficile negative last month. She is following up with GI,  remains on bland diet and taking Imodium as needed   DVT prophylaxis: Lovenox Code Status: Full code Family Communication: No family at bedside Disposition Plan:  Status is: Inpatient  Remains inpatient appropriate because:   Admitted for seizure-like activity , MRI EEG unremarkable. Left leg DVT started on Lovenox,  plan to transition to Eliquis before discharge.   Consultants:  None  Procedures: MRI and EEG Antimicrobials:  Anti-infectives (From admission, onward)    None      Subjective: Patient was seen and examined at bedside.  Overnight events noted.   Patient reports feeling better. He reports diarrhea has resolved but feels generalized weakness. His left leg is having DVT as confirmed on venous duplex.  started on Lovenox therapeutic dose.  Objective: Vitals:   06/01/21 0815 06/01/21 0903 06/01/21 1215 06/01/21 1332  BP:  (!) 149/99 (!) 102/48 (!) 129/55  Pulse:  99 74 69  Resp:   20   Temp:   98.6 F (37 C)   TempSrc:   Oral   SpO2: 98%  94%   Weight:      Height:        Intake/Output Summary (Last 24 hours) at 06/01/2021 1357 Last data filed at 06/01/2021  0740 Gross per 24 hour  Intake 120 ml  Output 1500 ml  Net -1380 ml   Filed Weights   05/30/21 1810 05/31/21 0356 06/01/21 0446  Weight: 78.9 kg 74.3 kg 73.9 kg    Examination:  General exam: Appears comfortable, not in any acute distress. Respiratory system: Clear to auscultation bilaterally, respiratory effort normal, RR 15 Cardiovascular system: S1 & S2 heard, regular rate and rhythm, no murmur. Gastrointestinal system: Abdomen is soft, nontender, nondistended, BS+ Central nervous system: Alert and oriented x 3. No focal neurological deficits. Extremities: Edema+, no cyanosis no clubbing.  Bilateral leg swelling, no tenderness Skin: No rashes, lesions or ulcers Psychiatry: Judgement and insight appear normal. Mood & affect appropriate.     Data Reviewed: I have personally reviewed following labs and imaging studies  CBC: Recent Labs  Lab 05/30/21 1900 05/31/21 0458 06/01/21 0007  WBC 10.0 9.0 8.3  NEUTROABS 7.2  --   --   HGB 12.1* 10.9* 10.8*  HCT 34.9* 32.5* 33.3*  MCV 96.1 98.5 100.9*  PLT 239 244 657   Basic Metabolic Panel: Recent Labs  Lab 05/30/21 1900 05/31/21 0458 05/31/21 1542 06/01/21 0007  NA 136 137  --  135  K 2.4* 2.3* 3.3* 3.3*  CL 97* 101  --  104  CO2 26 24  --  22  GLUCOSE 103* 120*  --  115*  BUN 15 11  --  9  CREATININE 1.39* 1.10  --  0.80  CALCIUM 6.2* 6.9*  --  7.5*  MG 0.7* 2.9*  --  1.9  PHOS  --  2.7  --  1.9*   GFR: Estimated Creatinine Clearance: 87.5 mL/min (by C-G formula based on SCr of 0.8 mg/dL). Liver Function Tests: Recent Labs  Lab 05/30/21 1900  AST 41  ALT 24  ALKPHOS 121  BILITOT 0.6  PROT 7.8  ALBUMIN 3.3*   No results for input(s): LIPASE, AMYLASE in the last 168 hours. No results for input(s): AMMONIA in the last 168 hours. Coagulation Profile: Recent Labs  Lab 05/31/21 1651  INR 1.1   Cardiac Enzymes: No results for input(s): CKTOTAL, CKMB, CKMBINDEX, TROPONINI in the last 168 hours. BNP  (last 3 results) No results for input(s): PROBNP in the last 8760 hours. HbA1C: No results for input(s): HGBA1C in the last 72 hours. CBG: Recent Labs  Lab 05/30/21 1827  GLUCAP 99   Lipid Profile: No results for input(s): CHOL, HDL, LDLCALC, TRIG, CHOLHDL, LDLDIRECT in the last 72 hours. Thyroid Function Tests: No results for input(s): TSH, T4TOTAL, FREET4, T3FREE, THYROIDAB in the last 72 hours. Anemia Panel: No results for input(s): VITAMINB12, FOLATE, FERRITIN, TIBC, IRON, RETICCTPCT in the last 72 hours. Sepsis Labs: No results for input(s): PROCALCITON, LATICACIDVEN in the last 168 hours.  Recent Results (from the past 240 hour(s))  Urine Culture     Status: None   Collection Time: 05/30/21  7:47 PM   Specimen: Urine, Clean Catch  Result Value Ref Range Status   Specimen Description   Final    URINE, CLEAN CATCH Performed at [facility], [address], [place], [place] [address]    Special  Requests   Final    NONE Performed at [facility], [address] [place], [place] [address]    Culture   Final    NO GROWTH Performed at [facility], [place] [address] [place], [place] [address]    Report Status 06/01/2021 FINAL  Final  Resp Panel by RT-PCR (Flu A&B, Covid) Nasopharyngeal Swab     Status: None   Collection Time: 05/30/21  7:47 PM   Specimen: Nasopharyngeal Swab; Nasopharyngeal(NP) swabs in vial transport medium  Result Value Ref Range Status   SARS Coronavirus 2 by RT PCR NEGATIVE NEGATIVE Final    Comment: (NOTE) SARS-CoV-2 target nucleic acids are NOT DETECTED.  The SARS-CoV-2 RNA is generally detectable in upper respiratory specimens during the acute phase of infection. The lowest concentration of SARS-CoV-2 viral copies this assay can detect is 138 copies/mL. A negative result does not preclude SARS-Cov-2 infection and should not be used as the sole basis for treatment or other patient management decisions. A negative  result may occur with  improper specimen collection/handling, submission of specimen other than nasopharyngeal swab, presence of viral mutation(s) within the areas targeted by this assay, and inadequate number of viral copies(<138 copies/mL). A negative result must be combined with clinical observations, patient history, and epidemiological information. The expected result is Negative.  Fact Sheet for Patients:  EntrepreneurPulse.com.au  Fact Sheet for Healthcare Providers:  IncredibleEmployment.be  This test is no t yet approved or cleared by the [place] FDA and  has been authorized for detection and/or diagnosis of SARS-CoV-2 by FDA under an Emergency Use Authorization (EUA). This EUA will remain  in effect (meaning this test can be used) for the duration of the COVID-19 declaration under Section 564(b)(1) of the Act, 21 U.S.C.section 360bbb-3(b)(1), unless the authorization is terminated  or revoked sooner.       Influenza A by PCR NEGATIVE NEGATIVE Final   Influenza B by PCR NEGATIVE NEGATIVE Final    Comment: (NOTE) The Xpert Xpress SARS-CoV-2/FLU/RSV plus assay is intended as an aid in the diagnosis of influenza from Nasopharyngeal swab specimens and should not be used as a sole basis for treatment. Nasal washings and aspirates are unacceptable for Xpert Xpress SARS-CoV-2/FLU/RSV testing.  Fact Sheet for Patients: EntrepreneurPulse.com.au  Fact Sheet for Healthcare Providers: IncredibleEmployment.be  This test is not yet approved or cleared by the [place] FDA and has been authorized for detection and/or diagnosis of SARS-CoV-2 by FDA under an Emergency Use Authorization (EUA). This EUA will remain in effect (meaning this test can be used) for the duration of the COVID-19 declaration under Section 564(b)(1) of the Act, 21 U.S.C. section 360bbb-3(b)(1), unless the authorization is  terminated or revoked.  Performed at [facility], [address], [place], [place] [address]    Radiology Studies: CT Head Wo Contrast  Result Date: 05/30/2021 CLINICAL DATA:  Syncopal episode possible seizure. EXAM: CT HEAD WITHOUT CONTRAST TECHNIQUE: Contiguous axial images were obtained from the base of the skull through the vertex without intravenous contrast. RADIATION DOSE REDUCTION: This exam was performed according to the departmental dose-optimization program which includes automated exposure control, adjustment of the mA and/or kV according to patient size and/or use of iterative reconstruction technique. COMPARISON:  None. FINDINGS: Brain: No evidence of acute infarction, hemorrhage, hydrocephalus, extra-axial collection or mass lesion/mass effect. Vascular: No hyperdense vessel or unexpected calcification. Skull: Normal. Negative for fracture or focal lesion. Sinuses/Orbits: The orbits and orbital contents are  unremarkable. There is deformity of the nasal bone consistent with a likely chronic fracture. There is mild membrane thickening in the ethmoid air cells and maxillary sinuses without fluid level. Other sinuses are clear. There is trace fluid in both mastoid tips, with otherwise clear mastoid airspaces. Other: None. IMPRESSION: 1. No acute intracranial CT findings. 2. Sinus membrane disease. 3. Trace mastoid effusions. 4. Deformity of the nasal bone likely chronic. Correlate clinically for point tenderness or recent trauma. Electronically Signed   By: Telford Nab M.D.   On: 05/30/2021 20:03   MR BRAIN WO CONTRAST  Result Date: 05/31/2021 CLINICAL DATA:  Seizure, new onset.  No trauma history. EXAM: MRI HEAD WITHOUT CONTRAST TECHNIQUE: Multiplanar, multiecho pulse sequences of the brain and surrounding structures were obtained without intravenous contrast. COMPARISON:  Head CT from yesterday FINDINGS: Truncated study at patient request, only diffusion, sagittal T1, axial  T2, and gradient images were acquired. No acute infarct, hemorrhage, hydrocephalus, or visible collection. No focal cortical finding to explain seizure. Chronic lacune or dilated perivascular spaces at the bilateral thalamus and right internal capsule. Unremarkable orbits and sinuses. Hypointense appearance at the C3 and C4 vertebral bodies, presence on both sides of the narrow disc space suggesting degenerative changes. IMPRESSION: 1. Truncated study due to patient request. 2. No acute finding including infarct. Electronically Signed   By: Jorje Guild M.D.   On: 05/31/2021 07:24   US Venous Img Lower Right (DVT Study)  Result Date: 05/30/2021 CLINICAL DATA:  Right leg pain and swelling for 2 days EXAM: RIGHT LOWER EXTREMITY VENOUS DOPPLER ULTRASOUND TECHNIQUE: Gray-scale sonography with graded compression, as well as color Doppler and duplex ultrasound were performed to evaluate the lower extremity deep venous systems from the level of the common femoral vein and including the common femoral, femoral, profunda femoral, popliteal and calf veins including the posterior tibial, peroneal and gastrocnemius veins when visible. The superficial great saphenous vein was also interrogated. Spectral Doppler was utilized to evaluate flow at rest and with distal augmentation maneuvers in the common femoral, femoral and popliteal veins. COMPARISON:  None. FINDINGS: Contralateral Common Femoral Vein: Respiratory phasicity is normal and symmetric with the symptomatic side. No evidence of thrombus. Normal compressibility. Common Femoral Vein: No evidence of thrombus. Normal compressibility, respiratory phasicity and response to augmentation. Saphenofemoral Junction: No evidence of thrombus. Normal compressibility and flow on color Doppler imaging. Profunda Femoral Vein: No evidence of thrombus. Normal compressibility and flow on color Doppler imaging. Femoral Vein: No evidence of thrombus. Normal compressibility,  respiratory phasicity and response to augmentation. Popliteal Vein: No evidence of thrombus. Normal compressibility, respiratory phasicity and response to augmentation. Calf Veins: No evidence of thrombus. Normal compressibility and flow on color Doppler imaging. Superficial Great Saphenous Vein: No evidence of thrombus. Normal compressibility. Venous Reflux:  None. Other Findings:  None. IMPRESSION: No evidence of deep venous thrombosis. Electronically Signed   By: Inez Catalina M.D.   On: 05/30/2021 20:07   DG Chest Portable 1 View  Result Date: 05/30/2021 CLINICAL DATA:  le edema, sob EXAM: PORTABLE CHEST 1 VIEW COMPARISON:  CT chest 06/01/2020 FINDINGS: The heart and mediastinal contours are within normal limits. Atherosclerotic plaque. No focal consolidation. No pulmonary edema. No pleural effusion. No pneumothorax. No acute osseous abnormality.  Old healed right rib fracture. IMPRESSION: 1. No active disease. 2.  Aortic Atherosclerosis (ICD10-I70.0). Electronically Signed   By: Iven Finn M.D.   On: 05/30/2021 20:34   EEG adult  Result Date: 05/31/2021 Zeb Comfort  Jenetta Downer, MD     05/31/2021 11:06 AM Patient Name: LUI BELLIS MRN: [id number] Epilepsy Attending: Lora Havens Referring Physician/Provider: Vianne Bulls, MD Date: 05/31/2021 Duration: 22.18 mins Patient history: 61yo M with seizure like activity. EEG to evaluate for seizure Level of alertness: Awake AEDs during EEG study: None Technical aspects: This EEG study was done with scalp electrodes positioned according to the 10-20 International system of electrode placement. Electrical activity was acquired at a sampling rate of 500Hz  and reviewed with a high frequency filter of 70Hz  and a low frequency filter of 1Hz . EEG data were recorded continuously and digitally stored. Description: The posterior dominant rhythm consists of 9Hz  activity of moderate voltage (25-35 uV) seen predominantly in posterior head regions, symmetric and  reactive to eye opening and eye closing. Physiologic photic driving was seen during photic stimulation. Hyperventilation was not performed.   IMPRESSION: This study is within normal limits. No seizures or epileptiform discharges were seen throughout the recording. Priyanka O Yadav   VAS [place] LOWER EXTREMITY VENOUS (DVT)  Result Date: 05/31/2021  Lower Venous DVT Study Patient Name:  JAZIER MCGLAMERY  Date of Exam:   05/31/2021 Medical Rec #: [id number]        Accession #:    [id number] Date of Birth: 1960-09-17       Patient Gender: M Patient Age:   65 years Exam Location:  [facility] Procedure:      VAS [place] LOWER EXTREMITY VENOUS (DVT) Referring Phys: Shawna Clamp --------------------------------------------------------------------------------  Indications: Edema.  Comparison Study: No prior study Performing Technologist: Maudry Mayhew MHA, RDMS, RVT, RDCS  Examination Guidelines: A complete evaluation includes B-mode imaging, spectral Doppler, color Doppler, and power Doppler as needed of all accessible portions of each vessel. Bilateral testing is considered an integral part of a complete examination. Limited examinations for reoccurring indications may be performed as noted. The reflux portion of the exam is performed with the patient in reverse Trendelenburg.  +-----+---------------+---------+-----------+----------+--------------+  RIGHT Compressibility Phasicity Spontaneity Properties Thrombus Aging  +-----+---------------+---------+-----------+----------+--------------+  CFV   Full            Yes       Yes                                    +-----+---------------+---------+-----------+----------+--------------+   +---------+---------------+---------+-----------+----------+--------------+  LEFT      Compressibility Phasicity Spontaneity Properties Thrombus Aging  +---------+---------------+---------+-----------+----------+--------------+  CFV       Full            Yes       Yes                                     +---------+---------------+---------+-----------+----------+--------------+  SFJ       Full                                                             +---------+---------------+---------+-----------+----------+--------------+  FV Prox   Full                                                             +---------+---------------+---------+-----------+----------+--------------+  FV Mid    Full                                                             +---------+---------------+---------+-----------+----------+--------------+  FV Distal Full                                                             +---------+---------------+---------+-----------+----------+--------------+  PFV       Full                                                             +---------+---------------+---------+-----------+----------+--------------+  POP       Full            Yes       Yes                                    +---------+---------------+---------+-----------+----------+--------------+  PTV       Full                                                             +---------+---------------+---------+-----------+----------+--------------+  PERO      None                      No                     Acute           +---------+---------------+---------+-----------+----------+--------------+     Summary: RIGHT: - No evidence of common femoral vein obstruction.  LEFT: - Findings consistent with acute deep vein thrombosis involving the left peroneal veins. - No cystic structure found in the popliteal fossa.  *See table(s) above for measurements and observations. Electronically signed by Monica Martinez MD on 05/31/2021 at 5:52:32 PM.    Final     Scheduled Meds:  amLODipine  10 mg Oral Daily   apixaban  10 mg Oral BID   Followed by   Derrill Memo ON 06/08/2021] apixaban  5 mg Oral BID   arformoterol  15 mcg Nebulization BID   And   umeclidinium bromide  1 puff Inhalation Daily   aspirin EC  81 mg Oral Daily    atorvastatin  80 mg Oral Daily   famotidine  20 mg Oral Daily   hydrALAZINE  25 mg Oral Q8H   lidocaine  1 patch Transdermal Q24H   metoprolol tartrate  100 mg Oral BID   sodium chloride flush  3 mL Intravenous Q12H   Continuous Infusions:  potassium PHOSPHATE IVPB (in mmol) 15 mmol (06/01/21 0912)      LOS: 2 days  Time spent:  [place], MD [facility]   If 7PM-7AM, please contact night-coverage

## 2021-06-01 NOTE — Discharge Instructions (Signed)
Information on my medicine - ELIQUIS (apixaban)  This medication education was reviewed with me or my healthcare representative as part of my discharge preparation.    Why was Eliquis prescribed for you? Eliquis was prescribed to treat blood clots that may have been found in the veins of your legs (deep vein thrombosis) or in your lungs (pulmonary embolism) and to reduce the risk of them occurring again.  What do You need to know about Eliquis ? The starting dose is 10 mg (two 5 mg tablets) taken TWICE daily for the FIRST SEVEN (7) DAYS, then on 06/08/2021  the dose is reduced to ONE 5 mg tablet taken TWICE daily.  Eliquis may be taken with or without food.   Try to take the dose about the same time in the morning and in the evening. If you have difficulty swallowing the tablet whole please discuss with your pharmacist how to take the medication safely.  Take Eliquis exactly as prescribed and DO NOT stop taking Eliquis without talking to the doctor who prescribed the medication.  Stopping may increase your risk of developing a new blood clot.  Refill your prescription before you run out.  After discharge, you should have regular check-up appointments with your healthcare provider that is prescribing your Eliquis.    What do you do if you miss a dose? If a dose of ELIQUIS is not taken at the scheduled time, take it as soon as possible on the same day and twice-daily administration should be resumed. The dose should not be doubled to make up for a missed dose.  Important Safety Information A possible side effect of Eliquis is bleeding. You should call your healthcare provider right away if you experience any of the following: Bleeding from an injury or your nose that does not stop. Unusual colored urine (red or dark brown) or unusual colored stools (red or black). Unusual bruising for unknown reasons. A serious fall or if you hit your head (even if there is no bleeding).  Some  medicines may interact with Eliquis and might increase your risk of bleeding or clotting while on Eliquis. To help avoid this, consult your healthcare provider or pharmacist prior to using any new prescription or non-prescription medications, including herbals, vitamins, non-steroidal anti-inflammatory drugs (NSAIDs) and supplements.  This website has more information on Eliquis (apixaban): http://www.eliquis.com/eliquis/home

## 2021-06-01 NOTE — Progress Notes (Addendum)
ANTICOAGULATION CONSULT NOTE - Follow Up Consult  Pharmacy Consult for heparin Indication: acute LLE DVT  No Known Allergies  Patient Measurements: Height: 5\' 6"  (167.6 cm) Weight: 73.9 kg (162 lb 14.7 oz) IBW/kg (Calculated) : 63.8 Heparin Dosing Weight: 74 kg  Vital Signs: Temp: 98.5 F (36.9 C) (01/31 0446) Temp Source: Oral (01/31 0446) BP: 145/103 (01/31 0446) Pulse Rate: 102 (01/31 0446)  Labs: Recent Labs    05/30/21 1900 05/31/21 0458 05/31/21 1651 06/01/21 0007 06/01/21 0525  HGB 12.1* 10.9*  --  10.8*  --   HCT 34.9* 32.5*  --  33.3*  --   PLT 239 244  --  263  --   APTT  --   --  40*  --   --   LABPROT  --   --  13.9  --   --   INR  --   --  1.1  --   --   HEPARINUNFRC  --   --   --  0.51 0.35  CREATININE 1.39* 1.10  --  0.80  --     Estimated Creatinine Clearance: 87.5 mL/min (by C-G formula based on SCr of 0.8 mg/dL).   Assessment: Patient's a 61 y.o M presented to the ED on 05/30/21 with c/o seizure like activity and LE swelling.  LLE doppler on 05/31/21 came back positive for acute deep vein thrombosis involving the left peroneal veins. He's currently on heparin drip for VTE treatment.  Today, 06/01/2021: - confirmatory heparin level is therapeutic at 0.35 wit heparin drip infusing at 1250 units/hr - cbc stable - no bleeding documented   Goal of Therapy:  Heparin level 0.3-0.7 units/ml Monitor platelets by anticoagulation protocol: Yes   Plan:  - continue heparin drip at 1250 units/hr - daily heparin level - monitor for s/sx bleeding  Calyse Murcia P 06/01/2021,7:17 AM _______________________________________  Adden: Per Dr. Dwyane Dee, change heparin drip to lovenox - d/c heparin drip - change to lovenox 75 mg SQ q12h - change cbc to Bertrand, PharmD, BCPS 06/01/2021 7:52 AM ______________________________________________  Adden: Pharmacy has been consulted to transition pt to Eliquis.  He received Lovenox 75 mg dose this morning at  0903 - d/c lovenox - start Eliquis 10 mg bid x7 days (starting at 9PM tonight), then 5 mg bid - pharmacy will sign off but will follow patient peripherally along with you.  Dia Sitter, PharmD, BCPS 06/01/2021 1:47 PM

## 2021-06-01 NOTE — Plan of Care (Signed)
Pt aox4, cooperative and ambulatory in room.  Electrolyte replacement per orders.   Problem: Education: Goal: Knowledge of General Education information will improve Description: Including pain rating scale, medication(s)/side effects and non-pharmacologic comfort measures Outcome: Progressing   Problem: Clinical Measurements: Goal: Ability to maintain clinical measurements within normal limits will improve Outcome: Progressing Goal: Will remain free from infection Outcome: Progressing Goal: Diagnostic test results will improve Outcome: Progressing Goal: Respiratory complications will improve Outcome: Progressing Goal: Cardiovascular complication will be avoided Outcome: Progressing   Problem: Elimination: Goal: Will not experience complications related to bowel motility Outcome: Progressing Goal: Will not experience complications related to urinary retention Outcome: Progressing   Problem: Safety: Goal: Ability to remain free from injury will improve Outcome: Progressing

## 2021-06-01 NOTE — Clinical Social Work Note (Signed)
°  Transition of Care [facility]) Screening Note   Patient Details  Name: Richard Gates Date of Birth: 16-Jun-1960   Transition of Care [facility]) CM/SW Contact:    Trish Mage, LCSW Phone Number: 06/01/2021, 11:36 AM    Transition of Care Department [facility]) has reviewed patient and no TOC needs have been identified at this time. We will continue to monitor patient advancement through interdisciplinary progression rounds. If new patient transition needs arise, please place a TOC consult.

## 2021-06-02 ENCOUNTER — Encounter (HOSPITAL_BASED_OUTPATIENT_CLINIC_OR_DEPARTMENT_OTHER): Payer: Self-pay

## 2021-06-02 ENCOUNTER — Ambulatory Visit (HOSPITAL_BASED_OUTPATIENT_CLINIC_OR_DEPARTMENT_OTHER): Payer: 59

## 2021-06-02 LAB — BASIC METABOLIC PANEL
Anion gap: 8 (ref 5–15)
BUN: 10 mg/dL (ref 8–23)
CO2: 22 mmol/L (ref 22–32)
Calcium: 8.3 mg/dL — ABNORMAL LOW (ref 8.9–10.3)
Chloride: 103 mmol/L (ref 98–111)
Creatinine, Ser: 0.76 mg/dL (ref 0.61–1.24)
GFR, Estimated: >60 mL/min (ref >60)
Glucose, Bld: 99 mg/dL (ref 70–99)
Potassium: 4 mmol/L (ref 3.5–5.1)
Sodium: 133 mmol/L — ABNORMAL LOW (ref 135–145)

## 2021-06-02 LAB — PHOSPHORUS: Phosphorus: 2.4 mg/dL — ABNORMAL LOW (ref 2.5–4.6)

## 2021-06-02 LAB — MAGNESIUM: Magnesium: 1.5 mg/dL — ABNORMAL LOW (ref 1.7–2.4)

## 2021-06-02 MED ORDER — APIXABAN 5 MG PO TABS: 10.0000 mg | ORAL_TABLET | Freq: Two times a day (BID) | ORAL | 0 refills | Status: DC

## 2021-06-02 MED ORDER — CYCLOBENZAPRINE HCL 5 MG PO TABS: 5.0000 mg | ORAL_TABLET | Freq: Two times a day (BID) | ORAL | 0 refills | Status: DC | PRN

## 2021-06-02 MED ORDER — METHOCARBAMOL 500 MG PO TABS: 500.0000 mg | ORAL_TABLET | Freq: Four times a day (QID) | ORAL | 0 refills | Status: DC

## 2021-06-02 MED ORDER — APIXABAN 5 MG PO TABS: 5.0000 mg | ORAL_TABLET | Freq: Two times a day (BID) | ORAL | 6 refills | Status: DC

## 2021-06-02 MED ORDER — MAGNESIUM SULFATE 2 GM/50ML IV SOLN
2.0000 g | Freq: Once | INTRAVENOUS | Status: AC
  Administered 2021-06-02: 2 g via INTRAVENOUS
  Filled 2021-06-02: qty 50

## 2021-06-02 NOTE — Plan of Care (Signed)
Pt for discharge home.  AVS Printed and reviewed with pt at bedside.  Follow up and new medications discussed. Bleeding precautions reiterated thoroughly.  All questions addressed, verbalized understanding.  IV and telemetry removed.   Problem: Education: Goal: Knowledge of General Education information will improve Description: Including pain rating scale, medication(s)/side effects and non-pharmacologic comfort measures Outcome: Adequate for Discharge   Problem: Clinical Measurements: Goal: Ability to maintain clinical measurements within normal limits will improve Outcome: Adequate for Discharge Goal: Will remain free from infection Outcome: Adequate for Discharge Goal: Diagnostic test results will improve Outcome: Adequate for Discharge Goal: Respiratory complications will improve Outcome: Adequate for Discharge Goal: Cardiovascular complication will be avoided Outcome: Adequate for Discharge   Problem: Elimination: Goal: Will not experience complications related to bowel motility Outcome: Adequate for Discharge Goal: Will not experience complications related to urinary retention Outcome: Adequate for Discharge   Problem: Safety: Goal: Ability to remain free from injury will improve Outcome: Adequate for Discharge

## 2021-06-02 NOTE — Discharge Summary (Addendum)
Physician Discharge Summary  Richard Gates:[id number] DOB: [birth year] DOA: 05/30/2021  PCP: Colon Branch, MD  Admit date: 05/30/2021  Discharge date: 06/02/2021  Admitted From: Home.  Disposition:  Home.  Recommendations for Outpatient Follow-up:  Follow up with PCP in 1-2 weeks. Please obtain BMP/CBC in one week. Advised to take Eliquis 10 mg twice a day for 6 days followed by Eliquis 5 mg twice a day for 6 to 7 months for DVT.  Home Health: None Equipment/Devices:None  Discharge Condition: Stable CODE STATUS:Full code Diet recommendation: Heart Healthy   Brief Summary/Hospital Course: This 61 years old male with PMH significant for hypertension, COPD, coronary artery calcification presented in the ED after an episode of seizure-like activity last night.  Patient reports that he has recently developed bilateral leg swelling and was feeling dizzy and lightheaded.  Last night he suffered loss of consciousness.  His wife heard him fall and came to check on him and reported both his arms and legs were shaking and he had some drooling.  EMS was called but patient regained awareness after EMS arrived.  Patient has been following up with GI for last 4 months for watery diarrhea without abdominal pain and bleeding.  Patient also reports drinking alcohol on weekends. CT head negative for any intracranial abnormality.  Venous duplex of right lower extremity negative for DVT.  EEG and MRI  are unremarkable.  Left lower extremity venous duplex positive for DVT,  started on heparin.  Subsequently transitioned to Eliquis.  Patient has been doing fine, electrolytes were replaced and improved.  Acute kidney injury has resolved serum creatinine back to baseline.  Patient is being discharged home  He was managed for blood problems.   Discharge Diagnoses:  Principal Problem:   Observed seizure-like activity (Long) Active Problems:   COPD mixed type (HCC)   Hypertension   Hypokalemia    Hypomagnesemia   Hypocalcemia   Renal insufficiency   Diarrhea   Prolonged QT interval  Seizure-like activity : Patient presented after generalized seizures like activity. CT head no acute findings. He had recently quit drinking but his last drink was 2 weeks ago. Likely from critical electrolyte derangements. Replaced electrolytes.  EEG:  No evidence of seizures. MRI : No acute infarct.   Electrolyte derangements(hypokalemia, hypomagnesemia, hypocalcemia) Serum potassium 2.4, magnesium 0.7 calcium 6.2 albumin 3.3. POA Replaced.  Resolved. Likely from watery diarrhea and possibly from recent poor p.o. intake.   Acute kidney injury: > Resolved. Serum creatinine 1.3 on admission, baseline unclear. Avoid nephrotoxic medications.  Monitor serum creatinine.   Leg swelling > Left leg DVT: Patient has bilateral pitting edema. No DVT on right lower extremity ultrasound. Elevate legs, consider stopping Norvasc. Left leg venous duplex positive for DVT,  started on Lovenox. Plan to transition to Eliquis before discharge   Essential hypertension: Continue Norvasc and metoprolol.   CAD: Denies any chest pain,  shortness of breath or palpitations. Continue aspirin, Lipitor and metoprolol.   Prolonged QT interval: Continue cardiac monitoring, correct electrolytes and avoid QT prolonging medications. Repeat EKG shows normal QTc   Diarrhea: Patient reports 34-month history of watery diarrhea.  C. difficile negative last month. She is following up with GI,  remains on bland diet and taking Imodium as needed    Discharge Instructions  Discharge Instructions     Call MD for:  difficulty breathing, headache or visual disturbances   Complete by: As directed    Call MD for:  persistant dizziness or light-headedness  Complete by: As directed    Call MD for:  persistant nausea and vomiting   Complete by: As directed    Diet - low sodium heart healthy   Complete by: As directed     Diet Carb Modified   Complete by: As directed    Discharge instructions   Complete by: As directed    Advised to follow-up with primary care physician in 1 week. Advised to take Eliquis 10 mg twice a day for 6 days followed by Eliquis 5 mg twice a day for 6 to 7 months for DVT.   Increase activity slowly   Complete by: As directed       Allergies as of 06/02/2021   No Known Allergies      Medication List     STOP taking these medications    naproxen sodium 220 MG tablet Commonly known as: ALEVE   pantoprazole 40 MG tablet Commonly known as: PROTONIX       TAKE these medications    albuterol 108 (90 Base) MCG/ACT inhaler Commonly known as: VENTOLIN HFA INHALE 2 PUFFS INTO THE LUNGS EVERY 6 HOURS AS NEEDED FOR WHEEZING OR SHORTNESS OF BREATH What changed: how to take this   amLODipine 10 MG tablet Commonly known as: NORVASC Take 1 tablet (10 mg total) by mouth daily.   apixaban 5 MG Tabs tablet Commonly known as: ELIQUIS Take 2 tablets (10 mg total) by mouth 2 (two) times daily for 6 days.   apixaban 5 MG Tabs tablet Commonly known as: ELIQUIS Take 1 tablet (5 mg total) by mouth 2 (two) times daily. Start taking on: June 08, 2021   aspirin EC 81 MG tablet Take 81 mg by mouth daily. Swallow whole.   atorvastatin 80 MG tablet Commonly known as: LIPITOR Take 1 tablet (80 mg total) by mouth daily.   Bevespi Aerosphere 9-4.8 MCG/ACT Aero Generic drug: Glycopyrrolate-Formoterol Inhale 2 puffs into the lungs 2 (two) times daily.   famotidine 20 MG tablet Commonly known as: PEPCID Take 1 tablet (20 mg total) by mouth daily.   methocarbamol 500 MG tablet Commonly known as: Robaxin Take 1 tablet (500 mg total) by mouth 4 (four) times daily.   metoprolol tartrate 100 MG tablet Commonly known as: LOPRESSOR Take 1 tablet (100 mg total) by mouth 2 (two) times daily.        Follow-up [place], [place] Follow up in 1 week(s).   Specialty:  Internal Medicine Contact information: [phone] [place] [address] [phone]         Leonie Man, MD .   Specialty: Cardiology Contact information: [address] [place] [address] [phone]                No Known Allergies  Consultations: None   Procedures/Studies: CT Head Wo Contrast  Result Date: 05/30/2021 CLINICAL DATA:  Syncopal episode possible seizure. EXAM: CT HEAD WITHOUT CONTRAST TECHNIQUE: Contiguous axial images were obtained from the base of the skull through the vertex without intravenous contrast. RADIATION DOSE REDUCTION: This exam was performed according to the departmental dose-optimization program which includes automated exposure control, adjustment of the mA and/or kV according to patient size and/or use of iterative reconstruction technique. COMPARISON:  None. FINDINGS: Brain: No evidence of acute infarction, hemorrhage, hydrocephalus, extra-axial collection or mass lesion/mass effect. Vascular: No hyperdense vessel or unexpected calcification. Skull: Normal. Negative for fracture or focal lesion.  Sinuses/Orbits: The orbits and orbital contents are unremarkable. There is deformity of the nasal bone consistent with a likely chronic fracture. There is mild membrane thickening in the ethmoid air cells and maxillary sinuses without fluid level. Other sinuses are clear. There is trace fluid in both mastoid tips, with otherwise clear mastoid airspaces. Other: None. IMPRESSION: 1. No acute intracranial CT findings. 2. Sinus membrane disease. 3. Trace mastoid effusions. 4. Deformity of the nasal bone likely chronic. Correlate clinically for point tenderness or recent trauma. Electronically Signed   By: Telford Nab M.D.   On: 05/30/2021 20:03   MR BRAIN WO CONTRAST  Result Date: 05/31/2021 CLINICAL DATA:  Seizure, new onset.  No trauma history. EXAM: MRI HEAD WITHOUT CONTRAST TECHNIQUE:  Multiplanar, multiecho pulse sequences of the brain and surrounding structures were obtained without intravenous contrast. COMPARISON:  Head CT from yesterday FINDINGS: Truncated study at patient request, only diffusion, sagittal T1, axial T2, and gradient images were acquired. No acute infarct, hemorrhage, hydrocephalus, or visible collection. No focal cortical finding to explain seizure. Chronic lacune or dilated perivascular spaces at the bilateral thalamus and right internal capsule. Unremarkable orbits and sinuses. Hypointense appearance at the C3 and C4 vertebral bodies, presence on both sides of the narrow disc space suggesting degenerative changes. IMPRESSION: 1. Truncated study due to patient request. 2. No acute finding including infarct. Electronically Signed   By: Jorje Guild M.D.   On: 05/31/2021 07:24   US Venous Img Lower Right (DVT Study)  Result Date: 05/30/2021 CLINICAL DATA:  Right leg pain and swelling for 2 days EXAM: RIGHT LOWER EXTREMITY VENOUS DOPPLER ULTRASOUND TECHNIQUE: Gray-scale sonography with graded compression, as well as color Doppler and duplex ultrasound were performed to evaluate the lower extremity deep venous systems from the level of the common femoral vein and including the common femoral, femoral, profunda femoral, popliteal and calf veins including the posterior tibial, peroneal and gastrocnemius veins when visible. The superficial great saphenous vein was also interrogated. Spectral Doppler was utilized to evaluate flow at rest and with distal augmentation maneuvers in the common femoral, femoral and popliteal veins. COMPARISON:  None. FINDINGS: Contralateral Common Femoral Vein: Respiratory phasicity is normal and symmetric with the symptomatic side. No evidence of thrombus. Normal compressibility. Common Femoral Vein: No evidence of thrombus. Normal compressibility, respiratory phasicity and response to augmentation. Saphenofemoral Junction: No evidence of  thrombus. Normal compressibility and flow on color Doppler imaging. Profunda Femoral Vein: No evidence of thrombus. Normal compressibility and flow on color Doppler imaging. Femoral Vein: No evidence of thrombus. Normal compressibility, respiratory phasicity and response to augmentation. Popliteal Vein: No evidence of thrombus. Normal compressibility, respiratory phasicity and response to augmentation. Calf Veins: No evidence of thrombus. Normal compressibility and flow on color Doppler imaging. Superficial Great Saphenous Vein: No evidence of thrombus. Normal compressibility. Venous Reflux:  None. Other Findings:  None. IMPRESSION: No evidence of deep venous thrombosis. Electronically Signed   By: Inez Catalina M.D.   On: 05/30/2021 20:07   DG Chest Portable 1 View  Result Date: 05/30/2021 CLINICAL DATA:  le edema, sob EXAM: PORTABLE CHEST 1 VIEW COMPARISON:  CT chest 06/01/2020 FINDINGS: The heart and mediastinal contours are within normal limits. Atherosclerotic plaque. No focal consolidation. No pulmonary edema. No pleural effusion. No pneumothorax. No acute osseous abnormality.  Old healed right rib fracture. IMPRESSION: 1. No active disease. 2.  Aortic Atherosclerosis (ICD10-I70.0). Electronically Signed   By: Iven Finn M.D.   On: 05/30/2021 20:34   EEG  adult  Result Date: 05/31/2021 Lora Havens, MD     05/31/2021 11:06 AM Patient Name: Richard DUNNAWAY MRN: [id number] Epilepsy Attending: Lora Havens Referring Physician/Provider: Vianne Bulls, MD Date: 05/31/2021 Duration: 22.18 mins Patient history: 62yo M with seizure like activity. EEG to evaluate for seizure Level of alertness: Awake AEDs during EEG study: None Technical aspects: This EEG study was done with scalp electrodes positioned according to the 10-20 International system of electrode placement. Electrical activity was acquired at a sampling rate of 500Hz  and reviewed with a high frequency filter of 70Hz  and a low frequency  filter of 1Hz . EEG data were recorded continuously and digitally stored. Description: The posterior dominant rhythm consists of 9Hz  activity of moderate voltage (25-35 uV) seen predominantly in posterior head regions, symmetric and reactive to eye opening and eye closing. Physiologic photic driving was seen during photic stimulation. Hyperventilation was not performed.   IMPRESSION: This study is within normal limits. No seizures or epileptiform discharges were seen throughout the recording. Priyanka O Yadav   VAS [place] LOWER EXTREMITY VENOUS (DVT)  Result Date: 05/31/2021  Lower Venous DVT Study Patient Name:  JALEEN FINCH  Date of Exam:   05/31/2021 Medical Rec #: [id number]        Accession #:    [id number] Date of Birth: 1960/09/25       Patient Gender: M Patient Age:   12 years Exam Location:  [facility] Procedure:      VAS [place] LOWER EXTREMITY VENOUS (DVT) Referring Phys: Shawna Clamp --------------------------------------------------------------------------------  Indications: Edema.  Comparison Study: No prior study Performing Technologist: Maudry Mayhew MHA, RDMS, RVT, RDCS  Examination Guidelines: A complete evaluation includes B-mode imaging, spectral Doppler, color Doppler, and power Doppler as needed of all accessible portions of each vessel. Bilateral testing is considered an integral part of a complete examination. Limited examinations for reoccurring indications may be performed as noted. The reflux portion of the exam is performed with the patient in reverse Trendelenburg.  +-----+---------------+---------+-----------+----------+--------------+  RIGHT Compressibility Phasicity Spontaneity Properties Thrombus Aging  +-----+---------------+---------+-----------+----------+--------------+  CFV   Full            Yes       Yes                                    +-----+---------------+---------+-----------+----------+--------------+    +---------+---------------+---------+-----------+----------+--------------+  LEFT      Compressibility Phasicity Spontaneity Properties Thrombus Aging  +---------+---------------+---------+-----------+----------+--------------+  CFV       Full            Yes       Yes                                    +---------+---------------+---------+-----------+----------+--------------+  SFJ       Full                                                             +---------+---------------+---------+-----------+----------+--------------+  FV Prox   Full                                                             +---------+---------------+---------+-----------+----------+--------------+  FV Mid    Full                                                             +---------+---------------+---------+-----------+----------+--------------+  FV Distal Full                                                             +---------+---------------+---------+-----------+----------+--------------+  PFV       Full                                                             +---------+---------------+---------+-----------+----------+--------------+  POP       Full            Yes       Yes                                    +---------+---------------+---------+-----------+----------+--------------+  PTV       Full                                                             +---------+---------------+---------+-----------+----------+--------------+  PERO      None                      No                     Acute           +---------+---------------+---------+-----------+----------+--------------+     Summary: RIGHT: - No evidence of common femoral vein obstruction.  LEFT: - Findings consistent with acute deep vein thrombosis involving the left peroneal veins. - No cystic structure found in the popliteal fossa.  *See table(s) above for measurements and observations. Electronically signed by Monica Martinez MD on 05/31/2021 at 5:52:32 PM.     Final      Subjective: Seen and examined at bedside.  Overnight events noted. Patient reports feeling better and wanted to be discharged.  Patient is being discharged home on Eliquis.  Discharge Exam: Vitals:   06/02/21 0841 06/02/21 0843  BP:    Pulse:    Resp:    Temp:    SpO2: 97% 97%   Vitals:   06/02/21 0527 06/02/21 0800 06/02/21 0841 06/02/21 0843  BP: 118/70 127/74    Pulse: 75 78    Resp:  17    Temp:  98.7 F (37.1 C)    TempSrc:  Oral    SpO2:  97% 97% 97%  Weight:      Height:        General: Pt is alert, awake, not in acute distress Cardiovascular: RRR,  S1/S2 +, no rubs, no gallops Respiratory: CTA bilaterally, no wheezing, no rhonchi Abdominal: Soft, NT, ND, bowel sounds + Extremities: no edema, no cyanosis    The results of significant diagnostics from this hospitalization (including imaging, microbiology, ancillary and laboratory) are listed below for reference.     Microbiology: Recent Results (from the past 240 hour(s))  Urine Culture     Status: None   Collection Time: 05/30/21  7:47 PM   Specimen: Urine, Clean Catch  Result Value Ref Range Status   Specimen Description   Final    URINE, CLEAN CATCH Performed at [facility], [address] [place], [place] [address]    Special Requests   Final    NONE Performed at [facility], [address] [place], [place] [address]    Culture   Final    NO GROWTH Performed at [facility], [place] [address], [place], [place] [address]    Report Status 06/01/2021 FINAL  Final  Resp Panel by RT-PCR (Flu A&B, Covid) Nasopharyngeal Swab     Status: None   Collection Time: 05/30/21  7:47 PM   Specimen: Nasopharyngeal Swab; Nasopharyngeal(NP) swabs in vial transport medium  Result Value Ref Range Status   SARS Coronavirus 2 by RT PCR NEGATIVE NEGATIVE Final    Comment: (NOTE) SARS-CoV-2 target nucleic acids are NOT DETECTED.  The SARS-CoV-2 RNA is generally detectable in  upper respiratory specimens during the acute phase of infection. The lowest concentration of SARS-CoV-2 viral copies this assay can detect is 138 copies/mL. A negative result does not preclude SARS-Cov-2 infection and should not be used as the sole basis for treatment or other patient management decisions. A negative result may occur with  improper specimen collection/handling, submission of specimen other than nasopharyngeal swab, presence of viral mutation(s) within the areas targeted by this assay, and inadequate number of viral copies(<138 copies/mL). A negative result must be combined with clinical observations, patient history, and epidemiological information. The expected result is Negative.  Fact Sheet for Patients:  EntrepreneurPulse.com.au  Fact Sheet for Healthcare Providers:  IncredibleEmployment.be  This test is no t yet approved or cleared by the [place] FDA and  has been authorized for detection and/or diagnosis of SARS-CoV-2 by FDA under an Emergency Use Authorization (EUA). This EUA will remain  in effect (meaning this test can be used) for the duration of the COVID-19 declaration under Section 564(b)(1) of the Act, 21 U.S.C.section 360bbb-3(b)(1), unless the authorization is terminated  or revoked sooner.       Influenza A by PCR NEGATIVE NEGATIVE Final   Influenza B by PCR NEGATIVE NEGATIVE Final    Comment: (NOTE) The Xpert Xpress SARS-CoV-2/FLU/RSV plus assay is intended as an aid in the diagnosis of influenza from Nasopharyngeal swab specimens and should not be used as a sole basis for treatment. Nasal washings and aspirates are unacceptable for Xpert Xpress SARS-CoV-2/FLU/RSV testing.  Fact Sheet for Patients: EntrepreneurPulse.com.au  Fact Sheet for Healthcare Providers: IncredibleEmployment.be  This test is not yet approved or cleared by the [place] FDA and has been  authorized for detection and/or diagnosis of SARS-CoV-2 by FDA under an Emergency Use Authorization (EUA). This EUA will remain in effect (meaning this test can be used) for the duration of the COVID-19 declaration under Section 564(b)(1) of the Act, 21 U.S.C. section 360bbb-3(b)(1), unless the authorization is terminated or revoked.  Performed at [facility], [address] [place],  [place] [address]      Labs: BNP (last 3 results) Recent Labs    05/30/21 1900  BNP 65.4   Basic Metabolic Panel: Recent Labs  Lab 05/30/21 1900 05/31/21 0458 05/31/21 1542 06/01/21 0007 06/02/21 0519  NA 136 137  --  135 133*  K 2.4* 2.3* 3.3* 3.3* 4.0  CL 97* 101  --  104 103  CO2 26 24  --  22 22  GLUCOSE 103* 120*  --  115* 99  BUN 15 11  --  9 10  CREATININE 1.39* 1.10  --  0.80 0.76  CALCIUM 6.2* 6.9*  --  7.5* 8.3*  MG 0.7* 2.9*  --  1.9 1.5*  PHOS  --  2.7  --  1.9* 2.4*   Liver Function Tests: Recent Labs  Lab 05/30/21 1900  AST 41  ALT 24  ALKPHOS 121  BILITOT 0.6  PROT 7.8  ALBUMIN 3.3*   No results for input(s): LIPASE, AMYLASE in the last 168 hours. No results for input(s): AMMONIA in the last 168 hours. CBC: Recent Labs  Lab 05/30/21 1900 05/31/21 0458 06/01/21 0007  WBC 10.0 9.0 8.3  NEUTROABS 7.2  --   --   HGB 12.1* 10.9* 10.8*  HCT 34.9* 32.5* 33.3*  MCV 96.1 98.5 100.9*  PLT 239 244 263   Cardiac Enzymes: No results for input(s): CKTOTAL, CKMB, CKMBINDEX, TROPONINI in the last 168 hours. BNP: Invalid input(s): POCBNP CBG: Recent Labs  Lab 05/30/21 1827  GLUCAP 99   D-Dimer No results for input(s): DDIMER in the last 72 hours. Hgb A1c No results for input(s): HGBA1C in the last 72 hours. Lipid Profile No results for input(s): CHOL, HDL, LDLCALC, TRIG, CHOLHDL, LDLDIRECT in the last 72 hours. Thyroid function studies No results for input(s): TSH, T4TOTAL, T3FREE, THYROIDAB in the last 72 hours.  Invalid input(s):  FREET3 Anemia work up No results for input(s): VITAMINB12, FOLATE, FERRITIN, TIBC, IRON, RETICCTPCT in the last 72 hours. Urinalysis    Component Value Date/Time   COLORURINE YELLOW 05/30/2021 1947   APPEARANCEUR CLEAR 05/30/2021 1947   LABSPEC 1.015 05/30/2021 1947   PHURINE 6.0 05/30/2021 1947   GLUCOSEU NEGATIVE 05/30/2021 1947   GLUCOSEU NEGATIVE 02/03/2021 1557   HGBUR SMALL (A) 05/30/2021 [place] 05/30/2021 [place] 05/30/2021 1947   PROTEINUR 30 (A) 05/30/2021 1947   UROBILINOGEN 0.2 02/03/2021 1557   NITRITE NEGATIVE 05/30/2021 1947   LEUKOCYTESUR NEGATIVE 05/30/2021 1947   Sepsis Labs Invalid input(s): PROCALCITONIN,  WBC,  LACTICIDVEN Microbiology Recent Results (from the past 240 hour(s))  Urine Culture     Status: None   Collection Time: 05/30/21  7:47 PM   Specimen: Urine, Clean Catch  Result Value Ref Range Status   Specimen Description   Final    URINE, CLEAN CATCH Performed at [facility], [address] [place], [place] [address]    Special Requests   Final    NONE Performed at [facility], [address] [place], [place] [address]    Culture   Final    NO GROWTH Performed at [facility], [place] [address] [place], [place] [address]    Report Status 06/01/2021 FINAL  Final  Resp Panel by RT-PCR (Flu A&B, Covid) Nasopharyngeal Swab     Status: None   Collection Time: 05/30/21  7:47 PM   Specimen: Nasopharyngeal Swab; Nasopharyngeal(NP) swabs in vial transport medium  Result Value  Ref Range Status   SARS Coronavirus 2 by RT PCR NEGATIVE NEGATIVE Final    Comment: (NOTE) SARS-CoV-2 target nucleic acids are NOT DETECTED.  The SARS-CoV-2 RNA is generally detectable in upper respiratory specimens during the acute phase of infection. The lowest concentration of SARS-CoV-2 viral copies this assay can detect is 138 copies/mL. A negative result does not preclude SARS-Cov-2 infection and  should not be used as the sole basis for treatment or other patient management decisions. A negative result may occur with  improper specimen collection/handling, submission of specimen other than nasopharyngeal swab, presence of viral mutation(s) within the areas targeted by this assay, and inadequate number of viral copies(<138 copies/mL). A negative result must be combined with clinical observations, patient history, and epidemiological information. The expected result is Negative.  Fact Sheet for Patients:  EntrepreneurPulse.com.au  Fact Sheet for Healthcare Providers:  IncredibleEmployment.be  This test is no t yet approved or cleared by the [place] FDA and  has been authorized for detection and/or diagnosis of SARS-CoV-2 by FDA under an Emergency Use Authorization (EUA). This EUA will remain  in effect (meaning this test can be used) for the duration of the COVID-19 declaration under Section 564(b)(1) of the Act, 21 U.S.C.section 360bbb-3(b)(1), unless the authorization is terminated  or revoked sooner.       Influenza A by PCR NEGATIVE NEGATIVE Final   Influenza B by PCR NEGATIVE NEGATIVE Final    Comment: (NOTE) The Xpert Xpress SARS-CoV-2/FLU/RSV plus assay is intended as an aid in the diagnosis of influenza from Nasopharyngeal swab specimens and should not be used as a sole basis for treatment. Nasal washings and aspirates are unacceptable for Xpert Xpress SARS-CoV-2/FLU/RSV testing.  Fact Sheet for Patients: EntrepreneurPulse.com.au  Fact Sheet for Healthcare Providers: IncredibleEmployment.be  This test is not yet approved or cleared by the [place] FDA and has been authorized for detection and/or diagnosis of SARS-CoV-2 by FDA under an Emergency Use Authorization (EUA). This EUA will remain in effect (meaning this test can be used) for the duration of the COVID-19 declaration  under Section 564(b)(1) of the Act, 21 U.S.C. section 360bbb-3(b)(1), unless the authorization is terminated or revoked.  Performed at [facility], [address] [place],  [address]      Time coordinating discharge: Over 30 minutes  SIGNED:   Shawna Clamp, MD  [facility] 06/02/2021, 10:26 AM Pager   If 7PM-7AM, please contact night-coverage

## 2021-06-02 NOTE — Progress Notes (Signed)
Pt requesting work Risk manager to work Herbalist from attending.  Dr. Dwyane Dee notified.

## 2021-06-02 NOTE — Progress Notes (Signed)
Pt discharged. Spouse unable to pick patient up until after work this evening. She is to bring patient clothes and shoes.  This RN offered our discharge lounge, disposable clothing/shoes and additional transportation options but pt refusing. He is insistent on waiting for his spouse.   Agricultural consultant notified.

## 2021-06-02 NOTE — Progress Notes (Signed)
Chaplain responded to page to get [facility] some shoes.  Through conversation, Richard Gates voiced that he didn't need any shoes or clothes because his wife would be picking him up once she got off work with a bag of his clothes.  Richard Gates voiced that he thought he would be discharging earlier which may have created some confusion but then he realized his wife would not be able to get off work by 11:30 am and he had some other treatments he still needed to take.  Chaplain offered support and just let him know if he did need something in reference to clothes she could try to provide it.   Richard Gates is looking forward to discharging today.  He was going to call his wife to see if she could come much sooner than 4:00 pm.    Chaplain offered listening, support and presence.    06/02/21 1400  Clinical Encounter Type  Visited With Patient  Visit Type Initial;Social support

## 2021-06-03 NOTE — Progress Notes (Signed)
Dr.Pyrtle, Richard Gates since his office visit 05/21/2021, he developed seizures and was admitted to the hospital 05/30/2021 with acute LE swelling and DVT. Repeat LFTs during his hospital admission were normal. Due to his hospital admission and normal LFTs I did not contact him to schedule hemochromatosis genetic testing.

## 2021-06-04 ENCOUNTER — Other Ambulatory Visit (HOSPITAL_BASED_OUTPATIENT_CLINIC_OR_DEPARTMENT_OTHER): Payer: Self-pay

## 2021-06-04 ENCOUNTER — Ambulatory Visit: Payer: 59 | Admitting: Pharmacist

## 2021-06-04 DIAGNOSIS — J449 Chronic obstructive pulmonary disease, unspecified: Secondary | ICD-10-CM

## 2021-06-04 DIAGNOSIS — I10 Essential (primary) hypertension: Secondary | ICD-10-CM

## 2021-06-04 DIAGNOSIS — E785 Hyperlipidemia, unspecified: Secondary | ICD-10-CM

## 2021-06-04 MED ORDER — FLUTICASONE-SALMETEROL 250-50 MCG/ACT IN AEPB
1.0000 | INHALATION_SPRAY | Freq: Two times a day (BID) | RESPIRATORY_TRACT | 2 refills | Status: DC
  Filled 2021-06-04: qty 60, 30d supply, fill #0
  Filled 2021-07-21: qty 60, 30d supply, fill #1

## 2021-06-06 NOTE — Patient Instructions (Signed)
Richard Gates,  It was a pleasure speaking with you today.  I have attached a summary of our visit today and information about your health goals.    If you have any questions or concerns, please feel free to contact me either at the phone number below or with a MyChart message.   Keep up the good work!  Cherre Robins, PharmD Clinical Pharmacist [facility] [phone] (direct line)  [phone] (main office number)   Hypertension: Blood pressure goal <140/90 BP Readings from Last 3 Encounters:  06/02/21 132/71  05/21/21 100/60  05/05/21 126/68   Current treatment: Amlodipine 10mg  daily  Metoprolol 100mg  twice a day  Interventions:  Education provided on blood pressure goal and importance of blood pressure control in prevention of cardiovascular and kidney disease Reviewed [employer] formulary - both amlodipine and metoprolol are on zero copay list.   Continue to take current blood pressure regimen - Great job getting blood pressure to goal!  Hyperlipidemia: Uncontrolled; LDL goal <100 and triglycerides <150 Lab Results  Component Value Date   CHOL 182 09/02/2020   CHOL 230 (H) 06/01/2020   CHOL 171 04/12/2019   Lab Results  Component Value Date   HDL 80.00 09/02/2020   HDL 72.60 06/01/2020   HDL 76 04/12/2019    Lab Results  Component Value Date   TRIG 387.0 (H) 09/02/2020   TRIG (H) 06/01/2020    568.0 Triglyceride is over 400; calculations on Lipids are invalid.   TRIG 200 (H) 04/12/2019    Lab Results  Component Value Date   LDLDIRECT 49.0 09/02/2020   LDLDIRECT 84.0 06/01/2020   LDLDIRECT 108.0 04/03/2018   Current treatment: Atorvastatin 80mg  daily   Interventions:  Education provided on lipids goals Continue atorvastatin 80mg  daily. Verified cost for 2023 with [facility] will be $0  Chronic Obstructive Pulmonary Disease: Uncontrolled; goal: decrease use of rescue inhaler / assist with cost of  Bevespi Current treatment: Bevespi Inhaler - inhale 2 puffs into lungs twice a day  Albuterol inhaler - inhale 2 puffs every 6 hours as needed for wheezing or shortness of breath Interventions:  Education provided on maintenance versus rescue therapy Working with patient assistance program and pharmacy on [place] (or alternative) approval  Start fluticasone/salmeterol inhaler - inhale 1 puff twice a day. Cost should be $25  Blood Clot: Current therapy:   Eliquis 10mg  twice a day for 5 days, then take 5mg  twice a day Interventions:  Discussed signs and symptoms of bleeding to monitor for Mailed application for Eliquis patient assistance program due to his high deductible plan. I anticipate medication will be too expensive for him.   Patient Goals/Self-Care Activities Over the next 90 days, patient will:  take medications as prescribed, focus on medication adherence by filling prescriptions on time, and collaborate with provider on medication access solutions  Follow Up Plan: Telephone follow up appointment with care management team member scheduled for:  2 weeks  Patient verbalizes understanding of instructions and care plan provided today and agrees to view in [place]. Active MyChart status confirmed with patient.

## 2021-06-06 NOTE — Chronic Care Management (AMB) (Signed)
Care Management   Pharmacy Note  06/06/2021 Name: Richard Gates MRN: [id number] DOB: [birth year]  Summary:  Appeal for [place] patient assistance program has been [place] and they are requesting additional information. They need patient to provide proof of his income - tax form or pay stub. Patient has not supplied yet. I checked with [place] and they are able to provide fluticasone/Slameterol for $25. Patient is agreeable to this.  New onset DVT. Started Eliquis 56m bid for 5 days, then 511mbid. Mailed patient application for patient assistance program. He has used coupon to get first 30 days free.   Subjective: Richard PENs a [age].o. year old male who is a primary care patient of PaColon BranchMD. The Care Management team was consulted for assistance with care management and care coordination needs.    Engaged with patient by telephone for follow up visit in response to provider referral for pharmacy case management and/or care coordination services.   The patient was given information about Care Management services today including:  Care Management services includes personalized support from designated clinical staff supervised by the patient's primary care provider, including individualized plan of care and coordination with other care providers. 24/7 contact phone numbers for assistance for urgent and routine care needs. The patient may stop case management services at any time by phone call to the office staff.  Patient agreed to services and consent obtained.  Assessment:  Review of patient status, including review of consultants reports, laboratory and other test data, was performed as part of comprehensive evaluation and provision of chronic care management services.   SDOH (Social Determinants of Health) assessments and interventions performed:  SDOH Interventions    Flowsheet Row Most Recent Value  SDOH Interventions   Transportation  Interventions Intervention Not Indicated       Recent Visits:  05/21/2021 GI - Seen for diarrhea. Stopped pantoprazole 4030mstarted famotidine 75m50mily; Recommended Innodium daily as needed for loose stools. 05/09/2021 - Internal Med (Dr Paz)[facility] [place] Call. Referred to hematology due to increased ferritin, elevated LFTs (r/o hemochromatosis); Recommended renal U/S and referral to nephrology due to increased creatinine and hypokalemia. Cancelled referral to endocrinology.  05/05/2021 - Internal Med (Dr Paz)Larose Kellsen for follow up. Labs ordered. See above phone note regarding recommendation after labs reviewd by Dr Paz.Larose Kells  05/30/2021 to 06/02/2021 - [facility] abserved seiure like activity and leg swelling. Noted to have DVT of left  lower extremity. Started on heparin with transition to Eliquis during hospitalization. AKI resolved at discharge.  Medications started at discharge: Eliquis 10mg85mce a day for 6 days, then 5mg t17me a day Medications stopped at discharge: naproxen 275mg a41mantoprazole 40mg  O44mtive:  Lab Results  Component Value Date   CREATININE 0.76 06/02/2021   CREATININE 0.80 06/01/2021   CREATININE 1.10 05/31/2021    Lab Results  Component Value Date   HGBA1C 5.8 02/16/2021       Component Value Date/Time   CHOL 182 09/02/2020 1623   TRIG 387.0 (H) 09/02/2020 1623   HDL 80.00 09/02/2020 1623   CHOLHDL 2 09/02/2020 1623   VLDL 77.4 (H) 09/02/2020 1623   LDLCALC 68 04/12/2019 1553   LDLDIRECT 49.0 09/02/2020 1623    Other: (TSH, CBC, Vit D, etc.)  Clinical ASCVD: No  The 10-year ASCVD risk score (Arnett DK, et al., 2019) is: 12.5%   Values used  to calculate the score:     Age: 61 years     Sex: Male     Is Non-Hispanic African American: Yes     Diabetic: No     Tobacco smoker: No     Systolic Blood Pressure: 008 mmHg     Is BP treated: Yes     HDL Cholesterol: 80 mg/dL     Total Cholesterol: 182 mg/dL      BP Readings from Last 3 Encounters:  06/02/21 132/71  05/21/21 100/60  05/05/21 126/68    Care Plan  No Known Allergies  Medications Reviewed Today     Reviewed by Cherre Robins, RPH-CPP (Pharmacist) on 06/06/21 at [place] Status: <None>   Medication Order Taking? Sig Documenting Provider Last Dose Status Informant  albuterol (VENTOLIN HFA) 108 (90 Base) MCG/ACT inhaler [id number] Yes INHALE 2 PUFFS INTO THE LUNGS EVERY 6 HOURS AS NEEDED FOR WHEEZING OR SHORTNESS OF BREATH  Patient taking differently: 2 puffs every 6 (six) hours as needed for wheezing or shortness of breath.   Colon Branch, MD Taking Active Self  amLODipine (NORVASC) 10 MG tablet [id number] Yes Take 1 tablet (10 mg total) by mouth daily. Colon Branch, MD Taking Active Self  apixaban (ELIQUIS) 5 MG TABS tablet [id number] Yes Take 2 tablets (10 mg total) by mouth 2 (two) times daily for 6 days. Shawna Clamp, MD Taking Active   apixaban (ELIQUIS) 5 MG TABS tablet [id number] No Take 1 tablet (5 mg total) by mouth 2 (two) times daily.  Patient not taking: Reported on 06/06/2021   Shawna Clamp, MD Not Taking Active   aspirin EC 81 MG tablet [id number] Yes Take 81 mg by mouth daily. Swallow whole. [provider] Taking Active Self  atorvastatin (LIPITOR) 80 MG tablet [id number] Yes Take 1 tablet (80 mg total) by mouth daily. Colon Branch, MD Taking Active Self  famotidine (PEPCID) 20 MG tablet [id number] Yes Take 1 tablet (20 mg total) by mouth daily. Noralyn Pick, NP Taking Active Self  fluticasone-salmeterol (ADVAIR) 250-50 MCG/ACT AEPB [id number] No Inhale 1 puff into the lungs in the morning and at bedtime.  Patient not taking: Reported on 06/06/2021   Colon Branch, MD Not Taking Active   Glycopyrrolate-Formoterol (BEVESPI AEROSPHERE) 9-4.8 MCG/ACT AERO [id number] No Inhale 2 puffs into the lungs 2 (two) times daily.  Patient not taking: Reported on 06/06/2021   [provider] Not Taking  Active Self           Med Note Cherre Robins B   Sat May 22, 2021 12:03 PM) Working on patient assistance  methocarbamol (ROBAXIN) 500 MG tablet [id number] Yes Take 1 tablet (500 mg total) by mouth 4 (four) times daily. Shawna Clamp, MD Taking Active   metoprolol tartrate (LOPRESSOR) 100 MG tablet [id number] Yes Take 1 tablet (100 mg total) by mouth 2 (two) times daily. Colon Branch, MD Taking Active Self            Patient Active Problem List   Diagnosis Date Noted   Hypokalemia 05/30/2021   Hypomagnesemia 05/30/2021   Hypocalcemia 05/30/2021   Observed seizure-like activity ([place]) 05/30/2021   Renal insufficiency    Diarrhea    Prolonged QT interval    Erectile dysfunction 09/07/2018   Hyperlipidemia with target LDL less than 70 08/17/2018   Left main coronary artery disease 06/15/2018   Coronary artery calcification seen on computed tomography 06/15/2018   DOE (dyspnea on exertion) 06/15/2018  Acute respiratory failure with hypoxia ([place]) 04/16/2018   Hypertension    Elevated troponin I level    COPD mixed type ([place]) 07/19/2016   PCP NOTES >>>>>>>>>>>>>>> 06/23/2016   Tobacco abuse 02/07/2012   Anxiety and depression 02/07/2012   Tachycardia 01/03/2012   Annual physical exam 05/12/2011    Conditions to be addressed/monitored: HTN, HLD, Hypertriglyceridemia, COPD, and Low B12; elevated LFTs; hypokalemia  Care Plan : General Pharmacy (Adult)  Updates made by Cherre Robins, RPH-CPP since 06/06/2021 12:00 AM     Problem: HTN; hyperdlipidemia; Low B12; Elevated LFTs; hypokalemia; COPD   Priority: High  Onset Date: 04/12/2021     Long-Range Goal: Provide education, support and care coordination for medication therapy and chronic conditions   Start Date: 04/12/2021  Priority: High  Note:   Current Barriers:  Unable to independently afford treatment regimen Unable to achieve control of hypertension or hyperlipidemia   Does not adhere to prescribed medication  regimen  Pharmacist Clinical Goal(s):  Over the next 90 days, patient will verbalize ability to afford treatment regimen achieve control of hyperlipidemia and hypertension  as evidenced by LDL < 100 and Tg < 150 and blood pressure <140/90 adhere to prescribed medication regimen as evidenced by refill history  through collaboration with PharmD and provider.   Interventions: 1:1 collaboration with Colon Branch, MD regarding development and update of comprehensive plan of care as evidenced by provider attestation and co-signature Inter-disciplinary care team collaboration (see longitudinal plan of care) Comprehensive medication review performed; medication list updated in electronic medical record  Hypertension: Improved blood pressure and last office blood pressure at goal since adherence to blood pressure medications has improved.  Current treatment: Amlodipine 22m daily  Metoprolol 1022mtwice a day  Current home readings: patient was not at home during call and did not have blood pressure readings Denies hypotensive/hypertensive symptoms Interventions:  Education provided on blood pressure goal and importance of blood pressure control in prevention of cardiovascular and kidney disease Reviewed adherence   Hyperlipidemia: Uncontrolled; LDL goal <100 and triglycerides <150 Current treatment: Atorvastatin 8042maily (changed at last visit due to being $0 on his health plan) Past medications tried: rosuvastatin (Changed in 2022 due to formulary restrictions)  Patient recently had elevated AST and alk. Phos.; abdominal ultrasound showed fatty liver. Suspect  LFTs might improve as lipids - LDL and Tg decrease.  Tolerating atorvastatin with no myalgias reported  Interventions:  Education provided on lipids goals  Continue to take atorvastatin 47m30mily  Chronic Obstructive Pulmonary Disease: Uncontrolled; goal: decrease use of rescue inhaler / assist with cost of Bevespi Current  treatment: Bevespi Inhaler - inhale 2 puffs into lungs twice a day Albuterol inhaler - inhale 2 puffs every 6 hours as needed for wheezing or shortness of breath No exacerbations requiring treatment in the last 6 months  Patient is not currently using Bevespi due to cost (patient has high deductible ACO plan and cost is $400) Rescue inhaler use: patient is using daily - filled 05/07/2021; 12/13/202211/22/2022; 02/25/2021; 02/01/2021; 01/11/2021; 12/27/2020; 12/01/2020; 11/11/2020  At last visit: I also checked to see if he would qualify for copay assistance through the asthma fund with [employer] assessment on line showed that he did not qualify but will try to call program directly as it appear he should qualify per program brochure. Assessed for open funds for asthma from [facility] other assistance funds but at this time programs for assistance with asthma medications is not  open. Interventions:  Education provided on maintenance versus rescue therapy Working on appeal for [place] [employer] [place] for patient assistance program for [place] but I have not received patient income documentation.  Also reviewed [facility] [employer] formulary previously. Advair is tier 1 = and thought this might be affordable to patient however when [facility] filled last week cost was $110 per 30 days due to deductible. I checked with [facility] and cost of generic Advair was $25 per 30 days. Patient is agreeable to this cost. Approved by PCP.    DVT - recently diagnosed:  DVT noted during hospitalization for seizure like activity Current therapy:   Eliquis 61m twice a day for 5 days, then take 564mtwice a day Interventions:  Discussed signs and symptoms of bleeding to monitor for Mailed patient application for Eliquis patient assistance program due to his high deductible plan. I anticipate medication will be too expensive for him.  Patient did report that he is interviewing with [place] currently is coOccupational hygienistor them) If he gets the job he will have better medical benefits.   Patient Goals/Self-Care Activities Over the next 90 days, patient will:  take medications as prescribed, focus on medication adherence by filling prescriptions on time, and collaborate with provider on medication access solutions  Follow Up Plan: Telephone follow up appointment with care management team member scheduled for:  2 weeks         Medication Assistance:  [place] was denied. Appeal is in process. Patient to provide documentation of income - either tax records or recent pay stub.   Follow Up:  Patient agrees to Care Plan and Follow-up.  Plan: Telephone follow up appointment with care management team member scheduled for:  2 weeks  TaCherre RobinsPharmD Clinical Pharmacist [facility]

## 2021-06-07 ENCOUNTER — Other Ambulatory Visit (HOSPITAL_BASED_OUTPATIENT_CLINIC_OR_DEPARTMENT_OTHER): Payer: Self-pay

## 2021-06-08 ENCOUNTER — Ambulatory Visit (INDEPENDENT_AMBULATORY_CARE_PROVIDER_SITE_OTHER): Payer: 59 | Admitting: *Deleted

## 2021-06-08 DIAGNOSIS — E538 Deficiency of other specified B group vitamins: Secondary | ICD-10-CM

## 2021-06-08 MED ORDER — CYANOCOBALAMIN 1000 MCG/ML IJ SOLN
1000.0000 ug | Freq: Once | INTRAMUSCULAR | Status: AC
  Administered 2021-06-08: 1000 ug via INTRAMUSCULAR

## 2021-06-08 NOTE — Progress Notes (Addendum)
Patient here for monthly b12 injection per Dr. Larose Kells.  Injection given in left deltoid and patient tolerated well.  Injection scheduled for next month.

## 2021-06-09 ENCOUNTER — Ambulatory Visit (HOSPITAL_BASED_OUTPATIENT_CLINIC_OR_DEPARTMENT_OTHER)
Admission: RE | Admit: 2021-06-09 | Discharge: 2021-06-09 | Disposition: A | Discharge status: Home / Self Care | Payer: 59 | Source: Ambulatory Visit | Attending: Internal Medicine | Admitting: Internal Medicine

## 2021-06-09 ENCOUNTER — Other Ambulatory Visit: Payer: Self-pay

## 2021-06-09 DIAGNOSIS — E876 Hypokalemia: Secondary | ICD-10-CM | POA: Diagnosis present

## 2021-06-09 DIAGNOSIS — N289 Disorder of kidney and ureter, unspecified: Secondary | ICD-10-CM | POA: Insufficient documentation

## 2021-06-15 ENCOUNTER — Other Ambulatory Visit: Payer: Self-pay

## 2021-06-15 ENCOUNTER — Encounter: Payer: Self-pay | Admitting: Family

## 2021-06-15 ENCOUNTER — Inpatient Hospital Stay: Payer: 59 | Attending: Hematology & Oncology

## 2021-06-15 ENCOUNTER — Inpatient Hospital Stay (HOSPITAL_BASED_OUTPATIENT_CLINIC_OR_DEPARTMENT_OTHER): Payer: 59 | Admitting: Family

## 2021-06-15 DIAGNOSIS — Z7901 Long term (current) use of anticoagulants: Secondary | ICD-10-CM | POA: Diagnosis not present

## 2021-06-15 DIAGNOSIS — I82402 Acute embolism and thrombosis of unspecified deep veins of left lower extremity: Secondary | ICD-10-CM | POA: Diagnosis not present

## 2021-06-15 LAB — CBC WITH DIFFERENTIAL (CANCER CENTER ONLY)
Abs Immature Granulocytes: 0.04 10*3/uL (ref 0.00–0.07)
Basophils Absolute: 0.1 10*3/uL (ref 0.0–0.1)
Basophils Relative: 1 %
Eosinophils Absolute: 0.1 10*3/uL (ref 0.0–0.5)
Eosinophils Relative: 1 %
HCT: 34.2 % — ABNORMAL LOW (ref 39.0–52.0)
Hemoglobin: 11.2 g/dL — ABNORMAL LOW (ref 13.0–17.0)
Immature Granulocytes: 1 %
Lymphocytes Relative: 22 %
Lymphs Abs: 1.7 10*3/uL (ref 0.7–4.0)
MCH: 32 pg (ref 26.0–34.0)
MCHC: 32.7 g/dL (ref 30.0–36.0)
MCV: 97.7 fL (ref 80.0–100.0)
Monocytes Absolute: 0.8 10*3/uL (ref 0.1–1.0)
Monocytes Relative: 10 %
Neutro Abs: 5.2 10*3/uL (ref 1.7–7.7)
Neutrophils Relative %: 65 %
Platelet Count: 590 10*3/uL — ABNORMAL HIGH (ref 150–400)
RBC: 3.5 MIL/uL — ABNORMAL LOW (ref 4.22–5.81)
RDW: 13.4 % (ref 11.5–15.5)
WBC Count: 8 10*3/uL (ref 4.0–10.5)
nRBC: 0 % (ref 0.0–0.2)

## 2021-06-15 LAB — CMP (CANCER CENTER ONLY)
ALT: 32 U/L (ref 0–44)
AST: 23 U/L (ref 15–41)
Albumin: 3.8 g/dL (ref 3.5–5.0)
Alkaline Phosphatase: 121 U/L (ref 38–126)
Anion gap: 11 (ref 5–15)
BUN: 18 mg/dL (ref 8–23)
CO2: 24 mmol/L (ref 22–32)
Calcium: 9.5 mg/dL (ref 8.9–10.3)
Chloride: 101 mmol/L (ref 98–111)
Creatinine: 1.64 mg/dL — ABNORMAL HIGH (ref 0.61–1.24)
GFR, Estimated: 47 mL/min — ABNORMAL LOW (ref >60)
Glucose, Bld: 103 mg/dL — ABNORMAL HIGH (ref 70–99)
Potassium: 4.4 mmol/L (ref 3.5–5.1)
Sodium: 136 mmol/L (ref 135–145)
Total Bilirubin: 0.3 mg/dL (ref 0.3–1.2)
Total Protein: 7.9 g/dL (ref 6.5–8.1)

## 2021-06-15 LAB — RETICULOCYTES
Immature Retic Fract: 11.7 % (ref 2.3–15.9)
RBC.: 3.46 MIL/uL — ABNORMAL LOW (ref 4.22–5.81)
Retic Count, Absolute: 46 10*3/uL (ref 19.0–186.0)
Retic Ct Pct: 1.3 % (ref 0.4–3.1)

## 2021-06-15 LAB — SEDIMENTATION RATE: Sed Rate: 100 mm/hr — ABNORMAL HIGH (ref 0–16)

## 2021-06-15 NOTE — Progress Notes (Signed)
Hematology/Oncology Consultation   Name: Richard Gates      MRN: [id number]    Location: Room/bed info not found  Date: 06/15/2021 Time:3:12 PM   REFERRING PHYSICIAN: Kathlene November, MD  REASON FOR CONSULT: Hemochromatosis    DIAGNOSIS:  Elevated Gates, workup for hemochromatosis pending Left lower extremity DVT - on Eliquis   HISTORY OF PRESENT ILLNESS: Richard Gates is a very pleasant 61 yo Richard Gates first noted in October 2022 (827).  Hgb is 11.2, MCV 97, platelets 590 and WBC count 8.0.  Richard Gates has constant pain in his neck and shoulders since leaving the hospital and states that recent x-rays with his chiropractor showed severe arthritis in these areas.  Richard Gates was hospitalized in late January for syncope, electrolyte imbalance and left lower extremity DVT effecting the peroneal veins. Richard Gates started Heparin during admission and transitioned to Eliquis at discharge.  No blood loss noted. No bruising or petechiae.  The swelling in his legs has resolved.  Richard Gates notes fatigue at times.  Richard Gates has no prior history of thrombotic event.  No known family history of thrombotic event.  No stroke or heart attack.  No testosterone or supplementation use.  Richard Gates states that Richard Gates does have sleep apnea but does not wear a CPAP.  Richard Gates quit smoking 2 years ago. No ETOH or recreational drug use.  Richard Gates states that Richard Gates does not have much of an appetite and Richard Gates is doing is best to stay well hydrated. His weight is 163 lbs.  Abdominal US in December 2022 showed fatty liver.  Richard Gates had his colonoscopy in June 2021. Richard Gates had several benign polyps removed and was also noted to have diverticulosis from cecum to sigmoid colon and internal hemorrhoids. No history of diabetes or thyroid disease.  No personal history of cancer. His sister had breast cancer.  Richard Gates denies hot flashes and night sweats.  No fever, chills, n/v, cough, rash, dizziness, SOB, chest pain, palpitations, abdominal pain or changes in bowel or bladder  habits.  Richard Gates has occasional tingling in his fingertips that comes and goes.  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   ROS: All other 10 point review of systems is negative.   PAST MEDICAL HISTORY:   Past Medical History:  Diagnosis Date   COPD (chronic obstructive pulmonary disease) ([place])    Coronary artery calcification seen on CAT scan 08/2019   Coronary calcium score 103; short LM with<25% mixed calcific plaque (minimal); aortic atherosclerosis with normal size.  No dissection.  No aortic valve calcification   GERD (gastroesophageal reflux disease)    Hyperlipidemia    Hypertension     ALLERGIES: No Known Allergies    MEDICATIONS:  Current Outpatient Medications on File Prior to Visit  Medication Sig Dispense Refill   albuterol (VENTOLIN HFA) 108 (90 Base) MCG/ACT inhaler INHALE 2 PUFFS INTO THE LUNGS EVERY 6 HOURS AS NEEDED FOR WHEEZING OR SHORTNESS OF BREATH (Patient taking differently: 2 puffs every 6 (six) hours as needed for wheezing or shortness of breath.) 18 g 5   amLODipine (NORVASC) 10 MG tablet Take 1 tablet (10 mg total) by mouth daily. 90 tablet 1   apixaban (ELIQUIS) 5 MG TABS tablet Take 2 tablets (10 mg total) by mouth 2 (two) times daily for 6 days. 24 tablet 0   apixaban (ELIQUIS) 5 MG TABS tablet Take 1 tablet (5 mg total) by mouth 2 (two) times daily. (Patient not taking:  Reported on 06/06/2021) 60 tablet 6   aspirin EC 81 MG tablet Take 81 mg by mouth daily. Swallow whole.     atorvastatin (LIPITOR) 80 MG tablet Take 1 tablet (80 mg total) by mouth daily. 30 tablet 5   famotidine (PEPCID) 20 MG tablet Take 1 tablet (20 mg total) by mouth daily. 30 tablet 1   fluticasone-salmeterol (ADVAIR) 250-50 MCG/ACT AEPB Inhale 1 puff into the lungs in the morning and at bedtime. (Patient not taking: Reported on 06/06/2021) 60 each 2   Glycopyrrolate-Formoterol (BEVESPI AEROSPHERE) 9-4.8 MCG/ACT AERO Inhale 2 puffs into the lungs 2  (two) times daily. (Patient not taking: Reported on 06/06/2021)     methocarbamol (ROBAXIN) 500 MG tablet Take 1 tablet (500 mg total) by mouth 4 (four) times daily. 20 tablet 0   metoprolol tartrate (LOPRESSOR) 100 MG tablet Take 1 tablet (100 mg total) by mouth 2 (two) times daily. 180 tablet 1   No current facility-administered medications on file prior to visit.     PAST SURGICAL HISTORY Past Surgical History:  Procedure Laterality Date   APPENDECTOMY     COLONOSCOPY  08/24/2016   POLYPECTOMY     TRANSTHORACIC ECHOCARDIOGRAM  04/2018   EF 65-70% (vigorous). No RWMA.  Gr 1 DD.  Normal valves.  (In setting of COPD exacerbation)    FAMILY HISTORY: Family History  Problem Relation Age of Onset   Stroke Father        July 3762: complicated by hemmrhage -- lost eyesight, memory loss   Hypertension Other    Heart disease Paternal Uncle    Breast cancer Sister    Heart disease Paternal Aunt        surgery   Heart attack Maternal Uncle    Colon cancer Neg Hx    Prostate cancer Neg Hx    Colon polyps Neg Hx    Esophageal cancer Neg Hx    Stomach cancer Neg Hx    Rectal cancer Neg Hx     SOCIAL HISTORY:  reports that Richard Gates quit smoking about 3 years ago. His smoking use included cigarettes. Richard Gates has a 30.75 pack-year smoking history. Richard Gates has never used smokeless tobacco. Richard Gates reports that Richard Gates does not currently use alcohol. Richard Gates reports that Richard Gates does not use drugs.  PERFORMANCE STATUS: The patient's performance status is 1 - Symptomatic but completely ambulatory  PHYSICAL EXAM: Most Recent Vital Signs: There were no vitals taken for this visit. BP (!) 112/59 (BP Location: Left Arm, Patient Position: Sitting)    Pulse 90    Temp 98.2 F (36.8 C) (Oral)    Resp 18    Ht 5\' 6"  (1.676 m)    Wt 163 lb 1.9 oz (74 kg)    SpO2 99%    BMI 26.33 kg/m   General Appearance:    Alert, cooperative, no distress, appears stated age  Head:    Normocephalic, without obvious abnormality, atraumatic  Eyes:     PERRL, conjunctiva/corneas clear, EOM's intact, fundi    benign, both eyes             Throat:   Lips, mucosa, and tongue normal; teeth and gums normal  Neck:   Supple, symmetrical, trachea midline, no adenopathy;       thyroid:  No enlargement/tenderness/nodules; no carotid   bruit or JVD  Back:     Symmetric, no curvature, ROM normal, no CVA tenderness  Lungs:     Clear to auscultation bilaterally, respirations unlabored  Chest  wall:    No tenderness or deformity  Heart:    Regular rate and rhythm, S1 and S2 normal, no murmur, rub   or gallop  Abdomen:     Soft, non-tender, bowel sounds active all four quadrants,    no masses, no organomegaly        Extremities:   Extremities normal, atraumatic, no cyanosis or edema  Pulses:   2+ and symmetric all extremities  Skin:   Skin color, texture, turgor normal, no rashes or lesions  Lymph nodes:   Cervical, supraclavicular, and axillary nodes normal  Neurologic:   CNII-XII intact. Normal strength, sensation and reflexes      throughout    LABORATORY DATA:  No results found for this or any previous visit (from the past 48 hour(s)).    RADIOGRAPHY: No results found.     PATHOLOGY: None  ASSESSMENT/PLAN: Mr. Dunnaway is a very pleasant 61 yo Richard Gates first noted in October 2022 (827).  Iron studies and hemochromatosis DNA are pending.  Elevated Gates may also be reactive due to arthritis as well as recent DVT.  We will go ahead and plan to see him again in 2 months.   All questions were answered. The patient knows to call the clinic with any problems, questions or concerns. We can certainly see the patient much sooner if necessary.   Lottie Dawson, NP

## 2021-06-16 LAB — FERRITIN: Ferritin: 909 ng/mL — ABNORMAL HIGH (ref 24–336)

## 2021-06-16 LAB — IRON AND IRON BINDING CAPACITY (CC-WL,HP ONLY)
Iron: 46 ug/dL (ref 45–182)
Saturation Ratios: 15 % — ABNORMAL LOW (ref 17.9–39.5)
TIBC: 308 ug/dL (ref 250–450)
UIBC: 262 ug/dL (ref 117–376)

## 2021-06-17 ENCOUNTER — Telehealth: Payer: Self-pay | Admitting: *Deleted

## 2021-06-17 NOTE — Telephone Encounter (Signed)
Per 06/15/21 los - called and gave upcoming appointments - confirmed

## 2021-06-21 LAB — HEMOCHROMATOSIS DNA-PCR(C282Y,H63D)

## 2021-06-22 ENCOUNTER — Ambulatory Visit (INDEPENDENT_AMBULATORY_CARE_PROVIDER_SITE_OTHER): Payer: 59 | Admitting: Internal Medicine

## 2021-06-22 ENCOUNTER — Encounter: Payer: Self-pay | Admitting: Internal Medicine

## 2021-06-22 VITALS — BP 132/78 | HR 79 | Temp 97.8°F | Resp 18 | Ht 66.0 in | Wt 160.2 lb

## 2021-06-22 DIAGNOSIS — K529 Noninfective gastroenteritis and colitis, unspecified: Secondary | ICD-10-CM

## 2021-06-22 DIAGNOSIS — I10 Essential (primary) hypertension: Secondary | ICD-10-CM | POA: Diagnosis not present

## 2021-06-22 DIAGNOSIS — M791 Myalgia, unspecified site: Secondary | ICD-10-CM | POA: Diagnosis not present

## 2021-06-22 DIAGNOSIS — I824Y2 Acute embolism and thrombosis of unspecified deep veins of left proximal lower extremity: Secondary | ICD-10-CM

## 2021-06-22 DIAGNOSIS — R569 Unspecified convulsions: Secondary | ICD-10-CM

## 2021-06-22 DIAGNOSIS — E876 Hypokalemia: Secondary | ICD-10-CM

## 2021-06-22 DIAGNOSIS — M549 Dorsalgia, unspecified: Secondary | ICD-10-CM

## 2021-06-22 MED ORDER — CYCLOBENZAPRINE HCL 10 MG PO TABS: 10.0000 mg | ORAL_TABLET | Freq: Every evening | ORAL | 0 refills | Status: DC | PRN

## 2021-06-22 MED ORDER — APIXABAN 5 MG PO TABS: 5.0000 mg | ORAL_TABLET | Freq: Two times a day (BID) | ORAL | 1 refills | Status: DC

## 2021-06-22 NOTE — Patient Instructions (Addendum)
Stop aspirin  Make an appointment to have blood work  For neck pain: Flexeril at bedtime.  Okay to take Tylenol.  Do not take naproxen.   I still recommend to see the nephrologist, you can call at: [phone]  Please come back in 3 months for follow-up

## 2021-06-22 NOTE — Progress Notes (Signed)
Subjective:    Patient ID: Richard Gates, male    DOB: 01-Jul-1960, 61 y.o.   MRN: [id number]  DOS:  06/22/2021 Type of visit - description: Follow-up  Last seen here 05/05/2021. Subsequently admitted to the hospital 05/30/2021, discharged few days later. - Seizure-like activity, CT head no acute, EEG no evidence of seizures, MRI no acute.  Query seizure related to electrolyte derangements or EtOH abstinence. - Potassium was 2.4, magnesium 0.7, calcium 6.2.  All replaced and resolved.  Related to chronic diarrhea? - Due to leg swelling, [place] was done, + left leg DVT.  Since he left the hospital, he is actually taking medications as prescribed. I asked how he felt and he said he feels "terrible" b/c has a severe pain at the upper shoulders & back of the neck.  Not a headache per se.  Pain is not particularly worse when he moves his head. No fever chills No generalized myalgias No pain around the hip girdle. Some weight loss noted. No visual disturbances. No upper extremity paresthesias.   Also reports no further seizure activity. Ambulatory BPs are very good. Diarrhea has decreased. He is back working.   Wt Readings from Last 3 Encounters:  06/22/21 160 lb 4 oz (72.7 kg)  06/15/21 163 lb 1.9 oz (74 kg)  06/02/21 162 lb 14.7 oz (73.9 kg)    Review of Systems See above   Past Medical History:  Diagnosis Date   COPD (chronic obstructive pulmonary disease) ([facility])    Coronary artery calcification seen on CAT scan 08/2019   Coronary calcium score 103; short LM with<25% mixed calcific plaque (minimal); aortic atherosclerosis with normal size.  No dissection.  No aortic valve calcification   GERD (gastroesophageal reflux disease)    Hyperlipidemia    Hypertension     Past Surgical History:  Procedure Laterality Date   APPENDECTOMY     COLONOSCOPY  08/24/2016   POLYPECTOMY     TRANSTHORACIC ECHOCARDIOGRAM  04/2018   EF 65-70% (vigorous). No RWMA.  Gr 1 DD.  Normal valves.   (In setting of COPD exacerbation)    Current Outpatient Medications  Medication Instructions   albuterol (VENTOLIN HFA) 108 (90 Base) MCG/ACT inhaler INHALE 2 PUFFS INTO THE LUNGS EVERY 6 HOURS AS NEEDED FOR WHEEZING OR SHORTNESS OF BREATH   amLODipine (NORVASC) 10 mg, Oral, Daily   apixaban (ELIQUIS) 10 mg, Oral, 2 times daily   apixaban (ELIQUIS) 5 mg, Oral, 2 times daily   aspirin EC 81 mg, Oral, Daily, Swallow whole.   atorvastatin (LIPITOR) 80 mg, Oral, Daily   famotidine (PEPCID) 20 mg, Oral, Daily   fluticasone-salmeterol (ADVAIR) 250-50 MCG/ACT AEPB 1 puff, Inhalation, 2 times daily   Glycopyrrolate-Formoterol (BEVESPI AEROSPHERE) 9-4.8 MCG/ACT AERO 2 puffs, 2 times daily   methocarbamol (ROBAXIN) 500 mg, Oral, 4 times daily   metoprolol tartrate (LOPRESSOR) 100 mg, Oral, 2 times daily       Objective:   Physical Exam BP 132/78 (BP Location: Left Arm, Patient Position: Sitting, Cuff Size: Small)    Pulse 79    Temp 97.8 F (36.6 C) (Oral)    Resp 18    Ht 5\' 6"  (1.676 m)    Wt 160 lb 4 oz (72.7 kg)    SpO2 98%    BMI 25.87 kg/m  General:   Well developed, NAD, BMI noted. HEENT:  Normocephalic . Face symmetric, atraumatic. MSK:  Range of motion of the neck normal.  Slightly TTP at the cervical spine.  Also slightly TTP at the trapezoid areas bilaterally. No TTP around the hip girdle. Lungs:  CTA B Normal respiratory effort, no intercostal retractions, no accessory muscle use. Heart: RRR,  no murmur.  Lower extremities: no pretibial edema bilaterally  Skin: Not pale. Not jaundice Neurologic:  alert & oriented X3.  Speech normal, gait appropriate for age and unassisted Psych--  Cognition and judgment appear intact.  Cooperative with normal attention span and concentration.  Behavior appropriate. No anxious or depressed appearing.      Assessment     ASSESSMENT Hyperglycemia HTN GERD Hyperlipidemia, high TG Allergies , nasal  COPD, PFTs 2013 showed ratio of  58, FEV1 of 1.78- 52% without significant bronchodilator response, TLC was 77% with DLCO 78% Smoker: quit 2020 Aortic sclerosis per CT 05-2020 Coronary CTA, Ca+ Co score 103.0, L main artery, saw cards, probably still a low risk situation.  Plan is CV RF,  ASA "no clear data suggest benefit" per cardiology note 11/08/2019 DVT L leg dx  05-31-2021    PLAN: Summary of recent admission 05/30/2021 discharge home 06-02-21: - Seizure-like activity, CT head no acute, EEG no evidence of seizures, MRI no acute.  Query seizure related to electrolyte derangements or EtOH abstinence. - Potassium was 2.4, magnesium 0.7, calcium 6.2.  All replaced and resolved.  Related to chronic diarrhea? - Due to leg swelling, [place] was done, + left leg DVT. -Acute kidney injury: Resolved before admission  Seizure activity: Admitted to the hospital due to multiple issues including seizure activity, resolved, felt to be due to electrolyte abnormalities. HTN, increased creatinine, decreased potassium: Labs done on 05/05/2021 when I saw him last, showed  several abnormalities including increased creatinine, was referred to nephrology to to increase creatinine and decrease K+. (Endo referral cancelled).  Since then US renal was normal, he was admitted to the hospital, creatinine has fluctuated, last reading was 1.6.  Plan: Insist on renal referral. DVT, L leg, Dx 05/31/2021, first event, on Eliquis 5 mg twice daily.  (apparently did not take 10 mg twice daily the first week).  Plan: Continue with present care until next OV. Discontinue aspirin: Currently  on aspirin due to calcium coronary score of 103.  On reviewing the cardiology note there was no definite benefit for aspirin, currently on Eliquis, plan: Stop aspirin temporarily. B12 deficiency: On injections History of mild anemia, increased ferritin, macrocytosis: Saw hematology 06/15/2021, they rx iron studies and hemochromatosis DNA (DNA neg) Chronic diarrhea: Saw GI 05/21/2021, they  rec to stop PPIs (diarrhea as side effects of Protonix?). Start famotidine. They are considering EGD if GERD symptoms resurface.    Diarrhea better, no GERD sxs so far  Upper back pain: Reports severe pain at the upper back, bilateral trapezial areas.  No headaches per se.  No generalized myalgias, no pain around the hip girdle. Etiology unclear, recommend to check a total CK, sed rate, recommend a muscle relaxant. Follow-up 3 months Time spent with the patient today 42 minutes due to reviewing recent admission, evaluating multiple medical problems and reviewing notes from other physicians. Also evaluated nonacute problem (upper back pain).  This visit occurred during the SARS-CoV-2 public health emergency.  Safety protocols were in place, including screening questions prior to the visit, additional usage of staff PPE, and extensive cleaning of exam room while observing appropriate contact time as indicated for disinfecting solutions.

## 2021-06-23 LAB — CK: Total CK: 74 U/L (ref 7–232)

## 2021-06-23 LAB — SEDIMENTATION RATE: Sed Rate: 62 mm/hr — ABNORMAL HIGH (ref 0–20)

## 2021-06-24 NOTE — Assessment & Plan Note (Addendum)
Summary of recent admission 05/30/2021 discharge home 06-02-21: - Seizure-like activity, CT head no acute, EEG no evidence of seizures, MRI no acute.  Query seizure related to electrolyte derangements or EtOH abstinence. - Potassium was 2.4, magnesium 0.7, calcium 6.2.  All replaced and resolved.  Related to chronic diarrhea? - Due to leg swelling, [place] was done, + left leg DVT. -Acute kidney injury: Resolved before admission  Seizure activity: Admitted to the hospital due to multiple issues including seizure activity, resolved, felt to be due to electrolyte abnormalities. HTN, increased creatinine, decreased potassium: Labs done on 05/05/2021 when I saw him last, showed  several abnormalities including increased creatinine, was referred to nephrology to to increase creatinine and decrease K+. (Endo referral cancelled).  Since then US renal was normal, he was admitted to the hospital, creatinine has fluctuated, last reading was 1.6.  Plan: Insist on renal referral. DVT, L leg, Dx 05/31/2021, first event, on Eliquis 5 mg twice daily.  (apparently did not take 10 mg twice daily the first week).  Plan: Continue with present care until next OV. Discontinue aspirin: Currently  on aspirin due to calcium coronary score of 103.  On reviewing the cardiology note there was no definite benefit for aspirin, currently on Eliquis, plan: Stop aspirin temporarily. B12 deficiency: On injections History of mild anemia, increased ferritin, macrocytosis: Saw hematology 06/15/2021, they rx iron studies and hemochromatosis DNA (DNA neg) Chronic diarrhea: Saw GI 05/21/2021, they rec to stop PPIs (diarrhea as side effects of Protonix?). Start famotidine. They are considering EGD if GERD symptoms resurface.    Diarrhea better, no GERD sxs so far  Upper back pain: Reports severe pain at the upper back, bilateral trapezial areas.  No headaches per se.  No generalized myalgias, no pain around the hip girdle. Etiology unclear, recommend  to check a total CK, sed rate, recommend a muscle relaxant. Follow-up 3 months

## 2021-06-25 ENCOUNTER — Other Ambulatory Visit: Payer: Self-pay | Admitting: Internal Medicine

## 2021-07-01 ENCOUNTER — Other Ambulatory Visit: Payer: Self-pay | Admitting: Internal Medicine

## 2021-07-01 ENCOUNTER — Ambulatory Visit: Payer: Self-pay | Admitting: Pharmacist

## 2021-07-01 DIAGNOSIS — J449 Chronic obstructive pulmonary disease, unspecified: Secondary | ICD-10-CM

## 2021-07-01 DIAGNOSIS — Z79899 Other long term (current) drug therapy: Secondary | ICD-10-CM

## 2021-07-01 DIAGNOSIS — I824Y2 Acute embolism and thrombosis of unspecified deep veins of left proximal lower extremity: Secondary | ICD-10-CM

## 2021-07-01 DIAGNOSIS — I10 Essential (primary) hypertension: Secondary | ICD-10-CM

## 2021-07-04 NOTE — Patient Instructions (Signed)
Richard Gates ?It was a pleasure speaking with you today.  ?I have attached a summary of our visit today and information about your health goals.  ?Below are the key points:  ?Patient Goals/Self-Care Activities ?take medications as prescribed ?focus on medication adherence by filling prescriptions on time, and  ?collaborate with provider on medication access solutions ?Contact clinical pharmacist when you have issues with medication costs. Richard Gates [phone] ?Call [employer] to check to make sure your coverage is up to date and let them know that the pharmacy has not been able to run your prescriptions. ? ? ?If you have any questions or concerns, please feel free to contact me either at the phone number below or with a MyChart message.  ? ?Keep up the good work! ? ?Cherre Robins, PharmD ?Clinical Pharmacist ?[facility] ?[place] ?[phone] (direct line)  ?[phone] (main office number) ? ? ?Care Plan ? ?Hypertension: ?Blood pressure goal <140/90 ?BP Readings from Last 3 Encounters:  ?06/22/21 132/78  ?06/15/21 (!) 112/59  ?06/02/21 132/71  ? ?Current treatment: ?Amlodipine '10mg'$  daily  ?Metoprolol '100mg'$  twice a day  ?Interventions:  ?Education provided on blood pressure goal and importance of blood pressure control in prevention of cardiovascular and kidney disease ?Reviewed [employer] formulary - both amlodipine and metoprolol are on zero copay list.   ?Continue to take current blood pressure regimen - Great job getting blood pressure to goal! ? ?Hyperlipidemia: ?Uncontrolled; LDL goal <100 and triglycerides <150 ?Lab Results  ?Component Value Date  ? CHOL 182 09/02/2020  ? CHOL 230 (H) 06/01/2020  ? CHOL 171 04/12/2019  ? ?Lab Results  ?Component Value Date  ? HDL 80.00 09/02/2020  ? HDL 72.60 06/01/2020  ? HDL 76 04/12/2019  ? ? ?Lab Results  ?Component Value Date  ? TRIG 387.0 (H) 09/02/2020  ? TRIG (H) 06/01/2020  ?  568.0 Triglyceride is over 400; calculations on Lipids  are invalid.  ? TRIG 200 (H) 04/12/2019  ? ? ?Lab Results  ?Component Value Date  ? LDLDIRECT 49.0 09/02/2020  ? LDLDIRECT 84.0 06/01/2020  ? LDLDIRECT 108.0 04/03/2018  ? ?Current treatment: ?Atorvastatin '80mg'$  daily   ?Interventions:  ?Education provided on lipids goals ?Continue atorvastatin '80mg'$  daily.  ?Due to have lipids rechecked at next office visit.  ? ?Chronic Obstructive Pulmonary Disease: ?Uncontrolled; goal: decrease use of rescue inhaler / assist with cost of Bevespi ?Current treatment: ?Fluticasone-salmeterol (Advair) 250/19mg - inhaler 1 puff into the lungs twice a day ?Albuterol inhaler - inhale 2 puffs every 6 hours as needed for wheezing or shortness of breath ?Interventions:  ?Education provided on maintenance versus rescue therapy ?Continue fluticasone/salmeterol inhaler - inhale 1 puff twice a day. Cost should be $25 at [facility]? ?Blood Clot: ?Current therapy:   ?Eliquis '10mg'$  twice a day for 5 days, then take '5mg'$  twice a day ?Interventions:  ?Discussed signs and symptoms of bleeding to monitor for ? ? ?Medication management ?Pharmacist Clinical Goal(s): ?Over the next 90 days, patient will work with PharmD and providers to maintain optimal medication adherence ?Current pharmacy: [facility] ?Interventions ?Comprehensive medication review performed. ?Reviewed refill history and assessed adherence ?Continue current medication management strategy ?Called [facility] to assist with cost of medications - atorvastatin, pantoprazole and famotidine. They should all be tier 1 on [employer] plan ($0) but per [facility] they are getting rejection that plan ended 06/01/2021. Patient to contact [facility] [employer] about coverage. On his [employer] plan these 3 medications would be over $  150. Asked [facility] to try GoodRx card - cost decreased to $47.24 which was acceptable to patient.  ? ?Patient Goals/Self-Care Activities ?take medications as prescribed ?focus on medication adherence by  filling prescriptions on time, and  ?collaborate with provider on medication access solutions ?Contact clinical pharmacist when you have issues with medication costs. Richard Gates [phone] ?Call [employer] to check to make sure your coverage is up to date and let them know that the pharmacy has not been able to run your prescriptions. ? ?Follow Up Plan: Telephone follow up appointment with care management team member scheduled for:  2 to 4 weeks ? ?

## 2021-07-04 NOTE — Chronic Care Management (AMB) (Signed)
Care Management   Pharmacy Note  07/04/2021 Name: Richard Gates MRN: [id number] DOB: 1960-07-03  Summary:  Patient states he has not pick up most recent refill of atorvastatin, pantoprazole and famotidine. Called [facility] to assist with cost of medications - atorvastatin, pantoprazole and famotidine. They should all be tier 1 on [employer] plan ($0) but per [facility] they are getting rejection that plan ended 06/01/2021. Patient to contact [employer] about coverage. On his [employer] plan these 3 medications would be over $150 (has high deductible plan). Asked [facility] to try GoodRx card - cost decreased to $47.24 which was acceptable to patient.   Subjective: Richard Gates is a 61 y.o. year old male who is a primary care patient of Colon Branch, MD. The Care Management team was consulted for assistance with care management and care coordination needs.    Engaged with patient by telephone for follow up visit in response to provider referral for pharmacy case management and/or care coordination services.   The patient was given information about Care Management services today including:  Care Management services includes personalized support from designated clinical staff supervised by the patient's primary care provider, including individualized plan of care and coordination with other care providers. 24/7 contact phone numbers for assistance for urgent and routine care needs. The patient may stop case management services at any time by phone call to the office staff.  Patient agreed to services and consent obtained.  Assessment:  Review of patient status, including review of consultants reports, laboratory and other test data, was performed as part of comprehensive evaluation and provision of chronic care management services.   SDOH (Social Determinants of Health) assessments and interventions performed:    Recent Visits:  06/22/2021 - Int Med (Dr Larose Kells) follow up hospitalization  for seizure like activity. Had very low potassium. Noted DVT. Also reported pain in upper shoulder and neck Prescribed cyclobenzaprine 10mg  qhs prn. Check sed rate and CK.   06/15/2021 - Hematology Eulas Post, NP) Referred for hemochromatosis. Iron studies and hemochromatosis DNA are pending.  Elevated ferritin may also be reactive due to arthritis as well as recent DVT.  We will go ahead and plan to see him again in 2 months.  05/21/2021 GI - Seen for diarrhea. Stopped pantoprazole 40mg ; started famotidine 20mg  daily; Recommended Innodium daily as needed for loose stools.  05/09/2021 - Internal Med (Dr Larose Kells) Phone Call. Referred to hematology due to increased ferritin, elevated LFTs (r/o hemochromatosis); Recommended renal U/S and referral to nephrology due to increased creatinine and hypokalemia. Cancelled referral to endocrinology.   05/05/2021 - Internal Med (Dr Larose Kells) Seen for follow up. Labs ordered. See above phone note regarding recommendation after labs reviewd by Dr Larose Kells.     05/30/2021 to 06/02/2021 - ED to Hospital Admission at [facility] for abserved seiure like activity and leg swelling. Noted to have DVT of left  lower extremity. Started on heparin with transition to Eliquis during hospitalization. AKI resolved at discharge.  Medications started at discharge: Eliquis 10mg  twice a day for 6 days, then 5mg  twice a day Medications stopped at discharge: naproxen 220mg  and pantoprazole 40mg   Objective:  Lab Results  Component Value Date   CREATININE 1.64 (H) 06/15/2021   CREATININE 0.76 06/02/2021   CREATININE 0.80 06/01/2021    Lab Results  Component Value Date   HGBA1C 5.8 02/16/2021       Component Value Date/Time   CHOL 182 09/02/2020 1623   TRIG 387.0 (H) 09/02/2020  1623   HDL 80.00 09/02/2020 1623   CHOLHDL 2 09/02/2020 1623   VLDL 77.4 (H) 09/02/2020 1623   LDLCALC 68 04/12/2019 1553   LDLDIRECT 49.0 09/02/2020 1623    Other: (TSH, CBC, Vit D,  etc.)  Clinical ASCVD: No  The 10-year ASCVD risk score (Arnett DK, et al., 2019) is: 12.5%   Values used to calculate the score:     Age: 32 years     Sex: Male     Is Non-Hispanic African American: Yes     Diabetic: No     Tobacco smoker: No     Systolic Blood Pressure: 132 mmHg     Is BP treated: Yes     HDL Cholesterol: 80 mg/dL     Total Cholesterol: 182 mg/dL     BP Readings from Last 3 Encounters:  06/22/21 132/78  06/15/21 (!) 112/59  06/02/21 132/71    Care Plan  No Known Allergies  Medications Reviewed Today     Reviewed by Henrene Pastor, RPH-CPP (Pharmacist) on 07/04/21 at 2004  Med List Status: <None>   Medication Order Taking? Sig Documenting Provider Last Dose Status Informant  albuterol (VENTOLIN HFA) 108 (90 Base) MCG/ACT inhaler [id number] Yes INHALE 2 PUFFS INTO THE LUNGS EVERY 6 HOURS AS NEEDED FOR WHEEZING OR SHORTNESS OF BREATH  Patient taking differently: 2 puffs every 6 (six) hours as needed for wheezing or shortness of breath.   Wanda Plump, MD Taking Active Self  amLODipine (NORVASC) 10 MG tablet [id number] Yes Take 1 tablet (10 mg total) by mouth daily. Wanda Plump, MD Taking Active Self  apixaban (ELIQUIS) 5 MG TABS tablet [id number] Yes Take 1 tablet (5 mg total) by mouth 2 (two) times daily. Wanda Plump, MD Taking Active   atorvastatin (LIPITOR) 80 MG tablet [id number] Yes Take 1 tablet (80 mg total) by mouth daily. Wanda Plump, MD Taking Active Self  cyclobenzaprine (FLEXERIL) 10 MG tablet [id number] Yes Take 1 tablet (10 mg total) by mouth at bedtime as needed for muscle spasms. Wanda Plump, MD Taking Active   famotidine (PEPCID) 20 MG tablet [id number] No Take 1 tablet (20 mg total) by mouth daily.  Patient not taking: Reported on 07/04/2021   Arnaldo Natal, NP Not Taking Active Self  fluticasone-salmeterol (ADVAIR) 250-50 MCG/ACT AEPB [id number] Yes Inhale 1 puff into the lungs in the morning and at bedtime. Wanda Plump, MD Taking Active    metoprolol tartrate (LOPRESSOR) 100 MG tablet [id number] Yes Take 1 tablet (100 mg total) by mouth 2 (two) times daily. Wanda Plump, MD Taking Active Self  pantoprazole (PROTONIX) 40 MG tablet [id number] Yes TAKE 1 TABLET(40 MG) BY MOUTH DAILY Wanda Plump, MD Taking Active             Patient Active Problem List   Diagnosis Date Noted   Hypokalemia 05/30/2021   Hypomagnesemia 05/30/2021   Hypocalcemia 05/30/2021   Observed seizure-like activity (HCC) 05/30/2021   Renal insufficiency    Diarrhea    Prolonged QT interval    Erectile dysfunction 09/07/2018   Hyperlipidemia with target LDL less than 70 08/17/2018   Left main coronary artery disease 06/15/2018   Coronary artery calcification seen on computed tomography 06/15/2018   DOE (dyspnea on exertion) 06/15/2018   Acute respiratory failure with hypoxia (HCC) 04/16/2018   Hypertension    Elevated troponin I level    COPD mixed type (HCC) 07/19/2016   PCP NOTES >>>>>>>>>>>>>>> 06/23/2016  Tobacco abuse 02/07/2012   Anxiety and depression 02/07/2012   Tachycardia 01/03/2012   Annual physical exam 05/12/2011    Conditions to be addressed/monitored: HTN, HLD, Hypertriglyceridemia, COPD, and Low B12; elevated LFTs; hypokalemia  Care Plan : General Pharmacy (Adult)  Updates made by Cherre Robins, RPH-CPP since 07/04/2021 12:00 AM     Problem: HTN; hyperdlipidemia; Low B12; Elevated LFTs; hypokalemia; COPD   Priority: High  Onset Date: 04/12/2021     Long-Range Goal: Provide education, support and care coordination for medication therapy and chronic conditions   Start Date: 04/12/2021  Priority: High  Note:   Current Barriers:  Unable to independently afford treatment regimen Unable to achieve control of hypertension or hyperlipidemia   Does not adhere to prescribed medication regimen  Pharmacist Clinical Goal(s):  Over the next 90 days, patient will verbalize ability to afford treatment regimen achieve control of  hyperlipidemia and hypertension  as evidenced by LDL < 100 and Tg < 150 and blood pressure <140/90 adhere to prescribed medication regimen as evidenced by refill history  through collaboration with PharmD and provider.   Interventions: 1:1 collaboration with Colon Branch, MD regarding development and update of comprehensive plan of care as evidenced by provider attestation and co-signature Inter-disciplinary care team collaboration (see longitudinal plan of care) Comprehensive medication review performed; medication list updated in electronic medical record  Hypertension: Improved and now at goal; Blood pressure goal < 140/90 BP Readings from Last 3 Encounters:  06/22/21 132/78  06/15/21 (!) 112/59  06/02/21 132/71  Current treatment: Amlodipine 10mg  daily  Metoprolol 100mg  twice a day  Current home readings: patient was not at home during call and did not have blood pressure readings Denies hypotensive/hypertensive symptoms Interventions:  Education provided on blood pressure goal and importance of blood pressure control in prevention of cardiovascular and kidney disease Reviewed adherence. Filled both blood pressure medications for 90 days in January 2023  Hyperlipidemia: Uncontrolled; LDL goal <100 and triglycerides <150 Current treatment: Atorvastatin 80mg  daily Past medications tried: rosuvastatin (Changed in 2022 due to formulary restrictions)  Patient states he has not picked up recent refill for atorvastatin due to cost of $80.97 Patient recently had elevated AST and alk. Phos.; abdominal ultrasound showed fatty liver. Suspect  LFTs might improve as lipids - LDL and Tg decrease.  Tolerating atorvastatin with no myalgias reported  Interventions:  Education provided on lipids goals  Called pharmacy. $80.97 is cost of atorvastatin on [employer]. Patient states he is suppose to have coverage with [employer]. Called [facility] and they tried to fill on [employer] and was rejected  (stated coverage ended 06/01/2021) Patient to call [employer] to inquire about coverage. I did get [facility] to use GoodRx card and cost of Atorvastatin was lowered to about $14 for 30 days.  Continue to take atorvastatin 80mg  daily.  Chronic Obstructive Pulmonary Disease: Uncontrolled but improving since he started generic Advair; goal: decrease use of rescue inhaler / assist with cost of Bevespi Current treatment: Fluticasone-salmeterol (Advair) 250/67mcg - inhaler 1 puff into the lungs twice a day Albuterol inhaler - inhale 2 puffs every 6 hours as needed for wheezing or shortness of breath No exacerbations requiring treatment in the last 6 months  Previous medications: Bevespi (cost - patient has high deductible [employer] plan and cost was $400) Rescue inhaler use: about once a day  At last visit: Checked to see if he would qualify for copay assistance through the asthma fund with [employer]. Initial assessment  on line showed that he did not qualify. Also tried to get coverage for [facility] thru [employer] program but was denied due to patient having insurance.  Patient is filling Advair at [facility]. Cost of generic Advair was $25 per 30 days.  Interventions:  Education provided on maintenance versus rescue therapy Continue current therapy for COPD  DVT - recently diagnosed:  DVT noted during hospitalization for seizure like activity Current therapy:   Eliquis $Remove'5mg'RCdiYPy$  twice a day Patient has been able to use discount care for Eliquis - cost for 30 days was $10.  Interventions:  Discussed signs and symptoms of bleeding to monitor for Continue Eliquis at current dose (expect duration of 6 months)   Medication management Pharmacist Clinical Goal(s): Over the next 90 days, patient will work with PharmD and providers to maintain optimal medication adherence Current pharmacy: [facility] Interventions Comprehensive medication review performed. Reviewed refill  history and assessed adherence Continue current medication management strategy [facility] to assist with cost of medications - atorvastatin, pantoprazole and famotidine. They should all be tier 1 on [employer] plan ($0) but per [facility] they are getting rejection that plan ended 06/01/2021. Patient to contact [employer] about coverage. On his [employer] plan these 3 medications would be over $150. Asked [facility] to try GoodRx card - cost decreased to $47.24 which was acceptable to patient.   Patient Goals/Self-Care Activities Over the next 90 days, patient will:  take medications as prescribed focus on medication adherence by filling prescriptions on time, and  collaborate with provider on medication access solutions Contact clinical pharmacist when you have issues with medication costs. Cherre Robins [phone] Call [employer] to check to make sure your coverage is up to date and let them know that the pharmacy has not been able to run your prescriptions.   Follow Up Plan: Telephone follow up appointment with care management team member scheduled for:  2 to 4 weeks         Medication Assistance:  Initial application for [employer]  medication assistance program was denied. Need 0atient to provide documentation of income - either tax records or recent pay stub to complete an appeal. Changed Bevespi to generic Advair and patient assistance program not needed.  Follow Up:  Patient agrees to Care Plan and Follow-up.  Plan: Telephone follow up appointment with care management team member scheduled for:  2 to 4 weeks  Cherre Robins, PharmD Clinical Pharmacist [facility]

## 2021-07-06 ENCOUNTER — Ambulatory Visit: Payer: Self-pay

## 2021-07-06 NOTE — Progress Notes (Deleted)
Richard Gates is a 61 y.o. male  presents to the office today for a B12 injection, per physician's orders. ?Original order: per Colon Branch, MD ? ?cyanocobalamin, 1000 mg/ml IM was administered in deltoid today.  ?Patient tolerated injection well.  ? ?Next appointment:    ?

## 2021-07-20 ENCOUNTER — Ambulatory Visit: Payer: Self-pay | Admitting: Pharmacist

## 2021-07-20 DIAGNOSIS — I10 Essential (primary) hypertension: Secondary | ICD-10-CM

## 2021-07-20 DIAGNOSIS — Z79899 Other long term (current) drug therapy: Secondary | ICD-10-CM

## 2021-07-20 DIAGNOSIS — J449 Chronic obstructive pulmonary disease, unspecified: Secondary | ICD-10-CM

## 2021-07-21 ENCOUNTER — Telehealth: Payer: Self-pay

## 2021-07-21 ENCOUNTER — Other Ambulatory Visit (HOSPITAL_BASED_OUTPATIENT_CLINIC_OR_DEPARTMENT_OTHER): Payer: Self-pay

## 2021-07-21 NOTE — Telephone Encounter (Signed)
Pt called and stated he spoke with you yesterday and at the time he could not remember a medication he needed some help and was going to call you back today with that information.  He called today while you were in a meeting and wanted to speak with you regarding that medication.  Please call pt back when you can at [phone]. ?

## 2021-07-21 NOTE — Telephone Encounter (Signed)
Patient requested refill for Advair and cyclobenzaprine. Patient has refills for Advair available. Called pharmacy to request refills for Advair.  ?Cyclobenzaprine has no refills left. Last filled 06/22/2021 for #21 tablets ?Sig: 1 tablet as needed for muscle spasms at bedtime ?Will forward to Dr Larose Kells for review and to sent to [facility] if approved.  ?

## 2021-07-22 ENCOUNTER — Other Ambulatory Visit (HOSPITAL_BASED_OUTPATIENT_CLINIC_OR_DEPARTMENT_OTHER): Payer: Self-pay

## 2021-07-22 MED ORDER — CYCLOBENZAPRINE HCL 10 MG PO TABS
10.0000 mg | ORAL_TABLET | Freq: Every evening | ORAL | 0 refills | Status: DC | PRN
Start: 2021-07-22 — End: 2021-09-29
  Filled 2021-07-22: qty 21, 21d supply, fill #0

## 2021-07-22 NOTE — Telephone Encounter (Signed)
Rx sent, takes prn ?

## 2021-07-26 ENCOUNTER — Emergency Department (HOSPITAL_BASED_OUTPATIENT_CLINIC_OR_DEPARTMENT_OTHER): Payer: 59

## 2021-07-26 ENCOUNTER — Encounter (HOSPITAL_BASED_OUTPATIENT_CLINIC_OR_DEPARTMENT_OTHER): Payer: Self-pay

## 2021-07-26 ENCOUNTER — Emergency Department (HOSPITAL_BASED_OUTPATIENT_CLINIC_OR_DEPARTMENT_OTHER)
Admission: EM | Admit: 2021-07-26 | Discharge: 2021-07-26 | Disposition: A | Discharge status: Home / Self Care | Payer: 59 | Attending: Emergency Medicine | Admitting: Emergency Medicine

## 2021-07-26 ENCOUNTER — Other Ambulatory Visit: Payer: Self-pay

## 2021-07-26 DIAGNOSIS — Z7901 Long term (current) use of anticoagulants: Secondary | ICD-10-CM | POA: Insufficient documentation

## 2021-07-26 DIAGNOSIS — M7989 Other specified soft tissue disorders: Secondary | ICD-10-CM | POA: Diagnosis present

## 2021-07-26 MED ORDER — PREDNISONE 50 MG PO TABS: ORAL_TABLET | ORAL | 0 refills | Status: DC

## 2021-07-26 NOTE — Discharge Instructions (Signed)
See Dr. Larose Kells for recheck in 2-3 days  ?

## 2021-07-26 NOTE — ED Triage Notes (Signed)
Pt c/o right hand swelling started 3/25-denies injury-states he had swelling to right knee 3/24 that was better 3/25 after using icy hot and tylenol-NAD-steady gait ?

## 2021-07-28 NOTE — ED Provider Notes (Signed)
?[facility] ?Provider Note ? ? ?CSN: [id number] ?Arrival date & time: 07/26/21  1613 ? ?  ? ?History ? ?Chief Complaint  ?Patient presents with  ? Hand Problem  ? ? ?Richard Gates is a 61 y.o. male. ? ?Pt complains of swelling to his right hand.  Pt reports no injury.  Pt reports he had a similar episode recently in his knee.  Pt denies any scratches or broken skin.  No fever or chills  ? ?The history is provided by the patient. No language interpreter was used.  ?Hand Pain ?This is a new problem. The problem occurs constantly. Nothing aggravates the symptoms. Nothing relieves the symptoms. He has tried nothing for the symptoms. The treatment provided no relief.  ? ?  ? ?Home Medications ?Prior to Admission medications   ?Medication Sig Start Date End Date Taking? Authorizing Provider  ?predniSONE (DELTASONE) 50 MG tablet One tablet a day for the next 5 days 07/26/21  Yes Caryl Ada K, PA-C  ?albuterol (VENTOLIN HFA) 108 (90 Base) MCG/ACT inhaler INHALE 2 PUFFS INTO THE LUNGS EVERY 6 HOURS AS NEEDED FOR WHEEZING OR SHORTNESS OF BREATH ?Patient taking differently: 2 puffs every 6 (six) hours as needed for wheezing or shortness of breath. 04/13/21   Colon Branch, MD  ?amLODipine (NORVASC) 10 MG tablet Take 1 tablet (10 mg total) by mouth daily. 05/12/21   Colon Branch, MD  ?apixaban (ELIQUIS) 5 MG TABS tablet Take 1 tablet (5 mg total) by mouth 2 (two) times daily. 06/22/21   Colon Branch, MD  ?atorvastatin (LIPITOR) 80 MG tablet Take 1 tablet (80 mg total) by mouth daily. 04/13/21   Colon Branch, MD  ?cyclobenzaprine (FLEXERIL) 10 MG tablet Take 1 tablet (10 mg total) by mouth at bedtime as needed for muscle spasms. 07/22/21   Colon Branch, MD  ?famotidine (PEPCID) 20 MG tablet Take 1 tablet (20 mg total) by mouth daily. ?Patient not taking: Reported on 07/04/2021 05/21/21   Noralyn Pick, NP  ?fluticasone-salmeterol (ADVAIR) 250-50 MCG/ACT AEPB Inhale 1 puff into the lungs in the  morning and at bedtime. 06/04/21   Colon Branch, MD  ?metoprolol tartrate (LOPRESSOR) 100 MG tablet Take 1 tablet (100 mg total) by mouth 2 (two) times daily. 05/26/21   Colon Branch, MD  ?pantoprazole (PROTONIX) 40 MG tablet TAKE 1 TABLET(40 MG) BY MOUTH DAILY 07/01/21   Colon Branch, MD  ?   ? ?Allergies    ?Patient has no known allergies.   ? ?Review of Systems   ?Review of Systems  ?All other systems reviewed and are negative. ? ?Physical Exam ?Updated Vital Signs ?BP (!) 142/85 (BP Location: Left Arm)   Pulse 95   Temp 98.6 ?F (37 ?C) (Oral)   Resp 16   Ht '5\' 6"'$  (1.676 m)   Wt 70.3 kg   SpO2 100%   BMI 25.02 kg/m?  ?Physical Exam ?Vitals and nursing note reviewed.  ?Constitutional:   ?   Appearance: He is well-developed.  ?HENT:  ?   Head: Normocephalic.  ?Cardiovascular:  ?   Rate and Rhythm: Normal rate.  ?Pulmonary:  ?   Effort: Pulmonary effort is normal.  ?Abdominal:  ?   General: There is no distension.  ?Musculoskeletal:     ?   General: Swelling and tenderness present.  ?   Cervical back: Normal range of motion.  ?   Comments: Swelling right hand,  slight redness,  from nv and ns intact  ?Skin: ?   General: Skin is warm.  ?Neurological:  ?   General: No focal deficit present.  ?   Mental Status: He is alert and oriented to person, place, and time.  ?Psychiatric:     ?   Mood and Affect: Mood normal.  ? ? ?ED Results / Procedures / Treatments   ?Labs ?(all labs ordered are listed, but only abnormal results are displayed) ?Labs Reviewed - No data to display ? ?EKG ?None ? ?Radiology ?DG Hand Complete Right ? ?Result Date: 07/26/2021 ?CLINICAL DATA:  Right hand swelling EXAM: RIGHT HAND - COMPLETE 3+ VIEW COMPARISON:  None. FINDINGS: Frontal, oblique, lateral views of the right hand are obtained. No acute fracture, subluxation, or dislocation. There is moderate osteoarthritis of the third metacarpophalangeal joint. Remaining joint spaces are relatively well preserved. Nonspecific radiodensities overlying the  soft tissues of the thenar eminence. There is diffuse soft tissue edema. IMPRESSION: 1. Diffuse soft tissue edema. 2. Moderate third metacarpophalangeal joint osteoarthritis. 3. No acute fracture. 4. Nonspecific punctate radiodensities overlying the soft tissues of the thenar eminence, of uncertain acuity. It is unclear whether these are in the soft tissues or on the skin surface. Electronically Signed   By: Randa Ngo M.D.   On: 07/26/2021 18:31   ? ?Procedures ?Procedures  ? ? ?Medications Ordered in ED ?Medications - No data to display ? ?ED Course/ Medical Decision Making/ A&P ?  ?                        ?Medical Decision Making ?Amount and/or Complexity of Data Reviewed ?Radiology: ordered. ? ?Risk ?Prescription drug management. ? ? ?MDM:  I suspect gout. No evidence of cellultiis, xray shows arthritis.  Pt given rx for prednisone  ? ? ? ? ? ? ? ?Final Clinical Impression(s) / ED Diagnoses ?Final diagnoses:  ?Swelling of right hand  ? ? ?Rx / DC Orders ?ED Discharge Orders   ? ?      Ordered  ?  predniSONE (DELTASONE) 50 MG tablet       ? 07/26/21 1855  ? ?  ?  ? ?  ? ?An After Visit Summary was printed and given to the patient.  ?  ?Fransico Meadow, PA-C ?07/28/21 1020 ? ?  ?Sherwood Gambler, MD ?08/05/21 0715 ? ?

## 2021-07-30 ENCOUNTER — Ambulatory Visit (INDEPENDENT_AMBULATORY_CARE_PROVIDER_SITE_OTHER): Payer: 59 | Admitting: Internal Medicine

## 2021-07-30 ENCOUNTER — Encounter: Payer: Self-pay | Admitting: Internal Medicine

## 2021-07-30 VITALS — BP 130/70 | HR 95 | Resp 20 | Ht 66.0 in | Wt 148.8 lb

## 2021-07-30 DIAGNOSIS — R2231 Localized swelling, mass and lump, right upper limb: Secondary | ICD-10-CM | POA: Diagnosis not present

## 2021-07-30 MED ORDER — PREDNISONE 10 MG PO TABS: ORAL_TABLET | ORAL | 0 refills | Status: DC

## 2021-07-30 NOTE — Progress Notes (Signed)
? ?Subjective:  ? ? Patient ID: Richard Gates, male    DOB: 12-26-1960, 61 y.o.   MRN: [id number] ? ?DOS:  07/30/2021 ?Type of visit - description: Acute ?7 days ago, he woke up with severe swelling at the dorsum of the R hand. ?The area was tender, slightly warm and red. ?There were no openings or discharge.  No history of injury.  No fever or chills. ? ?Went to the ER 4 days ago, x-ray showed diffuse soft tissue edema of the right hand, DJD, no fracture,+ nonspecific radiodensities at the thenar eminence. ? ?He was suspected gout, prescribed prednisone, overall is better but not back to normal. ?He is here for further advice. ? ?Review of Systems ?See above  ? ?Past Medical History:  ?Diagnosis Date  ? COPD (chronic obstructive pulmonary disease) ([place])   ? Coronary artery calcification seen on CAT scan 08/2019  ? Coronary calcium score 103; short LM with<25% mixed calcific plaque (minimal); aortic atherosclerosis with normal size.  No dissection.  No aortic valve calcification  ? GERD (gastroesophageal reflux disease)   ? Hyperlipidemia   ? Hypertension   ? ? ?Past Surgical History:  ?Procedure Laterality Date  ? APPENDECTOMY    ? COLONOSCOPY  08/24/2016  ? POLYPECTOMY    ? TRANSTHORACIC ECHOCARDIOGRAM  04/2018  ? EF 65-70% (vigorous). No RWMA.  Gr 1 DD.  Normal valves.  (In setting of COPD exacerbation)  ? ? ?Current Outpatient Medications  ?Medication Instructions  ? albuterol (VENTOLIN HFA) 108 (90 Base) MCG/ACT inhaler INHALE 2 PUFFS INTO THE LUNGS EVERY 6 HOURS AS NEEDED FOR WHEEZING OR SHORTNESS OF BREATH  ? amLODipine (NORVASC) 10 mg, Oral, Daily  ? apixaban (ELIQUIS) 5 mg, Oral, 2 times daily  ? atorvastatin (LIPITOR) 80 mg, Oral, Daily  ? cyclobenzaprine (FLEXERIL) 10 mg, Oral, At bedtime PRN  ? famotidine (PEPCID) 20 mg, Oral, Daily  ? fluticasone-salmeterol (ADVAIR) 250-50 MCG/ACT AEPB 1 puff, Inhalation, 2 times daily  ? metoprolol tartrate (LOPRESSOR) 100 mg, Oral, 2 times daily  ? pantoprazole  (PROTONIX) 40 MG tablet TAKE 1 TABLET(40 MG) BY MOUTH DAILY  ? predniSONE (DELTASONE) 50 MG tablet One tablet a day for the next 5 days  ? ? ?   ?Objective:  ? Physical Exam ?BP 130/70 (BP Location: Left Arm, Patient Position: Sitting, Cuff Size: Normal)   Pulse 95   Resp 20   Ht '5\' 6"'$  (1.676 m)   Wt 148 lb 12.8 oz (67.5 kg)   SpO2 98%   BMI 24.02 kg/m?  ?General:   ?Well developed, NAD, BMI noted. ?HEENT:  ?Normocephalic . Face symmetric, atraumatic ?Upper extremities: ?Left normal ?Right: ?Mild swelling at the dorsum of the hand mostly around the third knuckle.  Slightly TTP, no fluctuant, not red. ?Please see 2 pictures, the area is better. ?Skin: Not pale. Not jaundice ?Neurologic:  ?alert & oriented X3.  ?Speech normal, gait appropriate for age and unassisted ?Psych--  ?Cognition and judgment appear intact.  ?Cooperative with normal attention span and concentration.  ?Behavior appropriate. ?No anxious or depressed appearing.  ? ?Before going to the ER: ? ? ? ?Today's visit: ? ? ?   ?Assessment   ? ? ASSESSMENT ?Hyperglycemia ?HTN ?GERD ?Hyperlipidemia, high TG ?Allergies , nasal  ?COPD, PFTs 2013 showed ratio of 58, FEV1 of 1.78- 52% without significant bronchodilator response, TLC was 77% with DLCO 78% ?Smoker: quit 2020 ?Aortic sclerosis per CT 05-2020 ?Coronary CTA, Ca+ Co score 103.0, L main artery, saw  cards, probably still a low risk situation.  Plan is CV RF,  ASA "no clear data suggest benefit" per cardiology note 11/08/2019 ?DVT L leg dx  05-31-2021 ? ? ? ?PLAN: ?R hand swelling: ?Agreed that this was possibly gout although he has no history of such and he has not changed his diet. ?Not on a diuretic. ?He is finishing a round of prednisone from the ER tomorrow is afraid to stop it. ?Plan: ?Prednisone for few days, see prescription ?Ice, Voltaren gel, Tylenol. ?Call if not gradually better. ?  ? ?This visit occurred during the SARS-CoV-2 public health emergency.  Safety protocols were in place,  including screening questions prior to the visit, additional usage of staff PPE, and extensive cleaning of exam room while observing appropriate contact time as indicated for disinfecting solutions.  ? ?

## 2021-07-30 NOTE — Patient Instructions (Signed)
Take prednisone as prescribed ? ?Ice the area once or twice a day ? ?Okay to use Voltaren gel OTC twice daily (is a topical gel that you apply on top of the hand) ? ?Tylenol ? ?If symptoms resurface let me know ?

## 2021-07-30 NOTE — Chronic Care Management (AMB) (Signed)
?Care Management  ? ?Pharmacy Note ? ?07/30/2021 ?Name: Richard Gates MRN: [id number] DOB: 08/26/1960 ? ?Summary:  ?Patient endorses that he picked up all maintenance medications after our last visit after cost adjustment with [employer] card. He still reports that he is active with [employer] but when pharmacy attempt to fill prescriptions they receive rejection. Recommended patient take [employer] information to pharmacy to make sure they have accurate information.   ?Patient to continue to get generic Advair at [facility] at $25 per month.  ? ?07/30/2021 - Addendum - patient reports he has been switch from [employer] to [employer] coverage. He has provided to pharmacy and will bring in card today. ?Reviewed drug formulary for [employer]. Not able to find exact copay amounts but did find tier information:  ?Tier 1A - cyclobenzaprine - lowest copay ?Tier 1B - albuterol inhaler, amlodipine, atorvastatin, famotidine, generic Advair or generic Symbicort, metoprolol - low copay ?Tier 2 - Eliquis (patient is using discount card to get for $10) moderate copay cost.  ? ?Subjective: ?Richard Gates is a 61 y.o. year old male who is a primary care patient of Colon Branch, MD. The Care Management team was consulted for assistance with care management and care coordination needs.   ? ?Engaged with patient by telephone for follow up visit in response to provider referral for pharmacy case management and/or care coordination services.  ? ?The patient was given information about Care Management services today including:  ?Care Management services includes personalized support from designated clinical staff supervised by the patient's primary care provider, including individualized plan of care and coordination with other care providers. ?24/7 contact phone numbers for assistance for urgent and routine care needs. ?The patient may stop case management services at any time by phone call to the office  staff. ? ?Patient agreed to services and consent obtained. ? ?Assessment:  Review of patient status, including review of consultants reports, laboratory and other test data, was performed as part of comprehensive evaluation and provision of chronic care management services.  ? ?SDOH (Social Determinants of Health) assessments and interventions performed:  ? ? ?Recent Visits:  ?06/22/2021 - Int Med (Dr Larose Kells) follow up hospitalization for seizure like activity. Had very low potassium. Noted DVT. Also reported pain in upper shoulder and neck Prescribed cyclobenzaprine '10mg'$  qhs prn. Check sed rate and CK.  ? ?06/15/2021 - Hematology Eulas Post, NP) Referred for hemochromatosis. Iron studies and hemochromatosis DNA are pending.  ?Elevated ferritin may also be reactive due to arthritis as well as recent DVT.  ?We will go ahead and plan to see him again in 2 months. ? ?05/21/2021 GI - Seen for diarrhea. Stopped pantoprazole '40mg'$ ; started famotidine '20mg'$  daily; Recommended Innodium daily as needed for loose stools. ? ?05/09/2021 - Internal Med (Dr Larose Kells) Phone Call. Referred to hematology due to increased ferritin, elevated LFTs (r/o hemochromatosis); Recommended renal U/S and referral to nephrology due to increased creatinine and hypokalemia. Cancelled referral to endocrinology.  ? ?05/05/2021 - Internal Med (Dr Larose Kells) Seen for follow up. Labs ordered. See above phone note regarding recommendation after labs reviewd by Dr Larose Kells.  ? ? ? ?05/30/2021 to 06/02/2021 - ED to Hospital Admission at [facility] for abserved seiure like activity and leg swelling. Noted to have DVT of left  lower extremity. Started on heparin with transition to Eliquis during hospitalization. AKI resolved at discharge.  ?Medications started at discharge: Eliquis '10mg'$  twice a day for 6 days, then '5mg'$   twice a day ?Medications stopped at discharge: naproxen '220mg'$  and pantoprazole '40mg'$  ? ?Objective: ? ?Lab Results  ?Component Value Date  ? CREATININE 1.64  (H) 06/15/2021  ? CREATININE 0.76 06/02/2021  ? CREATININE 0.80 06/01/2021  ? ? ?Lab Results  ?Component Value Date  ? HGBA1C 5.8 02/16/2021  ? ? ?   ?Component Value Date/Time  ? CHOL 182 09/02/2020 1623  ? TRIG 387.0 (H) 09/02/2020 1623  ? HDL 80.00 09/02/2020 1623  ? CHOLHDL 2 09/02/2020 1623  ? VLDL 77.4 (H) 09/02/2020 1623  ? [place] 68 04/12/2019 1553  ? LDLDIRECT 49.0 09/02/2020 1623  ? ? ?Other: (TSH, CBC, Vit D, etc.) ? ?Clinical ASCVD: No  ?The 10-year ASCVD risk score (Arnett DK, et al., 2019) is: 14.3% ?  Values used to calculate the score: ?    Age: 80 years ?    Sex: Male ?    Is Non-Hispanic African American: Yes ?    Diabetic: No ?    Tobacco smoker: No ?    Systolic Blood Pressure: 235 mmHg ?    Is BP treated: Yes ?    HDL Cholesterol: 80 mg/dL ?    Total Cholesterol: 182 mg/dL   ? ? ?BP Readings from Last 3 Encounters:  ?07/26/21 (!) 142/85  ?06/22/21 132/78  ?06/15/21 (!) 112/59  ? ? ?Care Plan ? ?No Known Allergies ? ?Medications Reviewed Today   ? ? Reviewed by Cherre Robins, RPH-CPP (Pharmacist) on 07/04/21 at 2004  Med List Status: <None>  ? ?Medication Order Taking? Sig Documenting Provider Last Dose Status Informant  ?albuterol (VENTOLIN HFA) 108 (90 Base) MCG/ACT inhaler [id number] Yes INHALE 2 PUFFS INTO THE LUNGS EVERY 6 HOURS AS NEEDED FOR WHEEZING OR SHORTNESS OF BREATH  ?Patient taking differently: 2 puffs every 6 (six) hours as needed for wheezing or shortness of breath.  ? Colon Branch, MD Taking Active Self  ?amLODipine (NORVASC) 10 MG tablet [id number] Yes Take 1 tablet (10 mg total) by mouth daily. Colon Branch, MD Taking Active Self  ?apixaban (ELIQUIS) 5 MG TABS tablet [id number] Yes Take 1 tablet (5 mg total) by mouth 2 (two) times daily. Colon Branch, MD Taking Active   ?atorvastatin (LIPITOR) 80 MG tablet [id number] Yes Take 1 tablet (80 mg total) by mouth daily. Colon Branch, MD Taking Active Self  ?cyclobenzaprine (FLEXERIL) 10 MG tablet [id number] Yes Take 1 tablet (10 mg total) by  mouth at bedtime as needed for muscle spasms. Colon Branch, MD Taking Active   ?famotidine (PEPCID) 20 MG tablet [id number] No Take 1 tablet (20 mg total) by mouth daily.  ?Patient not taking: Reported on 07/04/2021  ? Noralyn Pick, NP Not Taking Active Self  ?fluticasone-salmeterol (ADVAIR) 250-50 MCG/ACT AEPB [id number] Yes Inhale 1 puff into the lungs in the morning and at bedtime. Colon Branch, MD Taking Active   ?metoprolol tartrate (LOPRESSOR) 100 MG tablet [id number] Yes Take 1 tablet (100 mg total) by mouth 2 (two) times daily. Colon Branch, MD Taking Active Self  ?pantoprazole (PROTONIX) 40 MG tablet [id number] Yes TAKE 1 TABLET(40 MG) BY MOUTH DAILY Colon Branch, MD Taking Active   ? ?  ?  ? ?  ? ? ?Patient Active Problem List  ? Diagnosis Date Noted  ? Hypokalemia 05/30/2021  ? Hypomagnesemia 05/30/2021  ? Hypocalcemia 05/30/2021  ? Observed seizure-like activity ([place]) 05/30/2021  ? Renal insufficiency   ? Diarrhea   ? Prolonged QT interval   ?  Erectile dysfunction 09/07/2018  ? Hyperlipidemia with target LDL less than 70 08/17/2018  ? Left main coronary artery disease 06/15/2018  ? Coronary artery calcification seen on computed tomography 06/15/2018  ? DOE (dyspnea on exertion) 06/15/2018  ? Acute respiratory failure with hypoxia ([place]) 04/16/2018  ? Hypertension   ? Elevated troponin I level   ? COPD mixed type ([place]) 07/19/2016  ? PCP NOTES >>>>>>>>>>>>>>> 06/23/2016  ? Tobacco abuse 02/07/2012  ? Anxiety and depression 02/07/2012  ? Tachycardia 01/03/2012  ? Annual physical exam 05/12/2011  ? ? ?Conditions to be addressed/monitored: HTN, HLD, Hypertriglyceridemia, COPD, and Low B12; elevated LFTs; hypokalemia ? ?Care Plan : General Pharmacy (Adult)  ?Updates made by Cherre Robins, RPH-CPP since 07/30/2021 12:00 AM  ?  ? ?Problem: HTN; hyperdlipidemia; Low B12; Elevated LFTs; hypokalemia; COPD   ?Priority: High  ?Onset Date: 04/12/2021  ?  ? ?Long-Range Goal: Provide education, support and care  coordination for medication therapy and chronic conditions   ?Start Date: 04/12/2021  ?Priority: High  ?Note:   ?Current Barriers:  ?Unable to independently afford treatment regimen ?Unable to achieve control of hype

## 2021-07-31 ENCOUNTER — Other Ambulatory Visit: Payer: Self-pay | Admitting: Nurse Practitioner

## 2021-07-31 NOTE — Assessment & Plan Note (Signed)
R hand swelling: ?Agreed that this was possibly gout although he has no history of such and he has not changed his diet. ?Not on a diuretic. ?He is finishing a round of prednisone from the ER tomorrow is afraid to stop it. ?Plan: ?Prednisone for few days, see prescription ?Ice, Voltaren gel, Tylenol. ?Call if not gradually better. ?  ?

## 2021-08-13 ENCOUNTER — Inpatient Hospital Stay: Payer: 59 | Admitting: Family

## 2021-08-13 ENCOUNTER — Other Ambulatory Visit: Payer: Self-pay | Admitting: Family

## 2021-08-13 ENCOUNTER — Inpatient Hospital Stay: Payer: 59 | Attending: Hematology & Oncology

## 2021-08-13 DIAGNOSIS — D649 Anemia, unspecified: Secondary | ICD-10-CM

## 2021-09-03 ENCOUNTER — Encounter: Payer: Self-pay | Admitting: Internal Medicine

## 2021-09-03 ENCOUNTER — Ambulatory Visit: Payer: 59 | Admitting: Internal Medicine

## 2021-09-20 ENCOUNTER — Ambulatory Visit: Payer: 59 | Admitting: Internal Medicine

## 2021-09-29 ENCOUNTER — Ambulatory Visit (INDEPENDENT_AMBULATORY_CARE_PROVIDER_SITE_OTHER): Payer: 59 | Admitting: Internal Medicine

## 2021-09-29 ENCOUNTER — Encounter: Payer: Self-pay | Admitting: Internal Medicine

## 2021-09-29 VITALS — BP 140/82 | HR 109 | Temp 97.5°F | Resp 16 | Ht 66.0 in | Wt 151.2 lb

## 2021-09-29 DIAGNOSIS — I10 Essential (primary) hypertension: Secondary | ICD-10-CM | POA: Diagnosis not present

## 2021-09-29 DIAGNOSIS — E538 Deficiency of other specified B group vitamins: Secondary | ICD-10-CM

## 2021-09-29 DIAGNOSIS — K591 Functional diarrhea: Secondary | ICD-10-CM

## 2021-09-29 DIAGNOSIS — E876 Hypokalemia: Secondary | ICD-10-CM

## 2021-09-29 NOTE — Patient Instructions (Addendum)
We are referring you back to gastroenterology for diarrhea  Stop Eliquis  Restart aspirin 81 mg 1 tablet daily  YourVitamin B12 is low, please take over-the-counter vitamin B12 supplement daily   GO TO THE LAB : Get the blood work     [place], [place] back for a checkup in 3 months

## 2021-09-29 NOTE — Progress Notes (Unsigned)
Subjective:    Patient ID: Richard Gates, male    DOB: [birth year], 61 y.o.   MRN: [id number]  DOS:  09/29/2021 Type of visit - description: Follow-up  Follow-up from previous visits. Reports good medication compliance He also reports poor appetite for a while, having nausea and vomiting postprandial when he "forced himself to eat". Denies hematemesis. BMs continue to be loose, having 2 or 3 episodes a day.  No blood in the stools. On chart review, despite above, no weight loss noted.  PHQ-9 was slightly elevated, when asked admits to some depression, work-related, denies suicidal ideas.  Did not like to talk much about the issue.  Wt Readings from Last 3 Encounters:  09/29/21 151 lb 4 oz (68.6 kg)  07/30/21 148 lb 12.8 oz (67.5 kg)  07/26/21 155 lb (70.3 kg)     Review of Systems See above   Past Medical History:  Diagnosis Date   COPD (chronic obstructive pulmonary disease) ([facility])    Coronary artery calcification seen on CAT scan 08/2019   Coronary calcium score 103; short LM with<25% mixed calcific plaque (minimal); aortic atherosclerosis with normal size.  No dissection.  No aortic valve calcification   GERD (gastroesophageal reflux disease)    Hyperlipidemia    Hypertension     Past Surgical History:  Procedure Laterality Date   APPENDECTOMY     COLONOSCOPY  08/24/2016   POLYPECTOMY     TRANSTHORACIC ECHOCARDIOGRAM  04/2018   EF 65-70% (vigorous). No RWMA.  Gr 1 DD.  Normal valves.  (In setting of COPD exacerbation)    Current Outpatient Medications  Medication Instructions   albuterol (VENTOLIN HFA) 108 (90 Base) MCG/ACT inhaler INHALE 2 PUFFS INTO THE LUNGS EVERY 6 HOURS AS NEEDED FOR WHEEZING OR SHORTNESS OF BREATH   amLODipine (NORVASC) 10 mg, Oral, Daily   apixaban (ELIQUIS) 5 mg, Oral, 2 times daily   atorvastatin (LIPITOR) 80 mg, Oral, Daily   cyclobenzaprine (FLEXERIL) 10 mg, Oral, At bedtime PRN   famotidine (PEPCID) 20 MG tablet TAKE 1 TABLET(20  MG) BY MOUTH DAILY   fluticasone-salmeterol (ADVAIR) 250-50 MCG/ACT AEPB 1 puff, Inhalation, 2 times daily   metoprolol tartrate (LOPRESSOR) 100 mg, Oral, 2 times daily   pantoprazole (PROTONIX) 40 MG tablet TAKE 1 TABLET(40 MG) BY MOUTH DAILY   predniSONE (DELTASONE) 10 MG tablet 4 tablets x 2 days, 3 tabs x 2 days, 2 tabs x 2 days, 1 tab x 2 days       Objective:   Physical Exam BP 140/82   Pulse (!) 109   Temp (!) 97.5 F (36.4 C) (Oral)   Resp 16   Ht '5\' 6"'$  (1.676 m)   Wt 151 lb 4 oz (68.6 kg)   SpO2 92%   BMI 24.41 kg/m  General:   Well developed, NAD, BMI noted.  HEENT:  Normocephalic . Face symmetric, atraumatic Lungs:  CTA B Normal respiratory effort, no intercostal retractions, no accessory muscle use. Heart: RRR,  no murmur.  Abdomen:  Not distended, soft, non-tender. No rebound or rigidity.   Skin: Not pale. Not jaundice Lower extremities: no pretibial edema bilaterally  Neurologic:  alert & oriented X3.  Speech normal, gait appropriate for age and unassisted Psych--  Cognition and judgment appear intact.  Cooperative with normal attention span and concentration.  Behavior appropriate. No anxious or depressed appearing.     Assessment     ASSESSMENT Hyperglycemia HTN GERD Hyperlipidemia, high TG Allergies , nasal  COPD, PFTs  2013 showed ratio of 58, FEV1 of 1.78- 52% without significant bronchodilator response, TLC was 77% with DLCO 78% Smoker: quit 2020 Aortic sclerosis per CT 05-2020 Coronary CTA, Ca+ Co score 103.0, L main artery, saw cards, probably still a low risk situation.  Plan is CV RF,  ASA "no clear data suggest benefit" per cardiology note 11/08/2019 DVT L leg dx  05-31-2021    PLAN: Seizure activity: Admitted to hospital few months ago, no further activity HTN, increased creatinine,  low K+: See previous entries, again referral to nephrology failed, he reports he got a phone call from nephrology but simply forgot about it. Currently on  metoprolol, amlodipine.  We will check a BMP, magnesium. DVT, L leg, DX 05/31/2021: Upon admission to the hospital 05-2021, a leg [place] was (-) for DVT, the next day a [place] was repeated and he was found to have a left peroneal DVT.  This is his first DVT, below the knee, he has been anticoagulated for 4 months.  Okay to stop anticoagulation. Restart aspirin 81 mg Chronic diarrhea: Ongoing issue, see HPI, refer to GI B12 deficiency: Last shot was February 27, not on OTCs, checking labs.    Depression:PHQ-9 score 12, he admits to some depression, denies suicidal ideas.  Seems to be work-related.  He did not like to open up too much about the issue.  Recommend to see a counselor. RTC 3 months

## 2021-09-30 ENCOUNTER — Other Ambulatory Visit: Payer: Self-pay

## 2021-09-30 ENCOUNTER — Telehealth: Payer: Self-pay | Admitting: Internal Medicine

## 2021-09-30 ENCOUNTER — Inpatient Hospital Stay (HOSPITAL_COMMUNITY)
Admission: EM | Admit: 2021-09-30 | Discharge: 2021-10-03 | DRG: 392 | Disposition: A | Discharge status: Home / Self Care | Payer: No Typology Code available for payment source | Attending: Internal Medicine | Admitting: Internal Medicine

## 2021-09-30 ENCOUNTER — Encounter (HOSPITAL_COMMUNITY): Payer: Self-pay

## 2021-09-30 ENCOUNTER — Telehealth: Payer: Self-pay

## 2021-09-30 DIAGNOSIS — B3781 Candidal esophagitis: Secondary | ICD-10-CM | POA: Diagnosis present

## 2021-09-30 DIAGNOSIS — K2970 Gastritis, unspecified, without bleeding: Principal | ICD-10-CM

## 2021-09-30 DIAGNOSIS — I10 Essential (primary) hypertension: Secondary | ICD-10-CM | POA: Diagnosis not present

## 2021-09-30 DIAGNOSIS — K449 Diaphragmatic hernia without obstruction or gangrene: Secondary | ICD-10-CM | POA: Diagnosis present

## 2021-09-30 DIAGNOSIS — E876 Hypokalemia: Secondary | ICD-10-CM | POA: Diagnosis not present

## 2021-09-30 DIAGNOSIS — K2990 Gastroduodenitis, unspecified, without bleeding: Secondary | ICD-10-CM | POA: Diagnosis present

## 2021-09-30 DIAGNOSIS — I2510 Atherosclerotic heart disease of native coronary artery without angina pectoris: Secondary | ICD-10-CM | POA: Diagnosis present

## 2021-09-30 DIAGNOSIS — J449 Chronic obstructive pulmonary disease, unspecified: Secondary | ICD-10-CM | POA: Diagnosis not present

## 2021-09-30 DIAGNOSIS — I129 Hypertensive chronic kidney disease with stage 1 through stage 4 chronic kidney disease, or unspecified chronic kidney disease: Secondary | ICD-10-CM | POA: Diagnosis present

## 2021-09-30 DIAGNOSIS — Z86718 Personal history of other venous thrombosis and embolism: Secondary | ICD-10-CM

## 2021-09-30 DIAGNOSIS — K529 Noninfective gastroenteritis and colitis, unspecified: Secondary | ICD-10-CM

## 2021-09-30 DIAGNOSIS — E538 Deficiency of other specified B group vitamins: Secondary | ICD-10-CM | POA: Diagnosis present

## 2021-09-30 DIAGNOSIS — R197 Diarrhea, unspecified: Secondary | ICD-10-CM | POA: Diagnosis present

## 2021-09-30 DIAGNOSIS — Z8249 Family history of ischemic heart disease and other diseases of the circulatory system: Secondary | ICD-10-CM

## 2021-09-30 DIAGNOSIS — Z79899 Other long term (current) drug therapy: Secondary | ICD-10-CM

## 2021-09-30 DIAGNOSIS — Z87891 Personal history of nicotine dependence: Secondary | ICD-10-CM

## 2021-09-30 DIAGNOSIS — K2100 Gastro-esophageal reflux disease with esophagitis, without bleeding: Secondary | ICD-10-CM

## 2021-09-30 DIAGNOSIS — E878 Other disorders of electrolyte and fluid balance, not elsewhere classified: Principal | ICD-10-CM | POA: Diagnosis present

## 2021-09-30 DIAGNOSIS — K2980 Duodenitis without bleeding: Secondary | ICD-10-CM | POA: Diagnosis present

## 2021-09-30 DIAGNOSIS — K648 Other hemorrhoids: Secondary | ICD-10-CM | POA: Diagnosis present

## 2021-09-30 DIAGNOSIS — R9431 Abnormal electrocardiogram [ECG] [EKG]: Secondary | ICD-10-CM | POA: Diagnosis present

## 2021-09-30 DIAGNOSIS — K5730 Diverticulosis of large intestine without perforation or abscess without bleeding: Secondary | ICD-10-CM | POA: Diagnosis present

## 2021-09-30 DIAGNOSIS — R748 Abnormal levels of other serum enzymes: Secondary | ICD-10-CM

## 2021-09-30 DIAGNOSIS — N189 Chronic kidney disease, unspecified: Secondary | ICD-10-CM | POA: Diagnosis present

## 2021-09-30 DIAGNOSIS — R933 Abnormal findings on diagnostic imaging of other parts of digestive tract: Secondary | ICD-10-CM

## 2021-09-30 DIAGNOSIS — E785 Hyperlipidemia, unspecified: Secondary | ICD-10-CM | POA: Diagnosis present

## 2021-09-30 DIAGNOSIS — Z7982 Long term (current) use of aspirin: Secondary | ICD-10-CM

## 2021-09-30 LAB — COMPREHENSIVE METABOLIC PANEL
ALT: 20 U/L (ref 0–44)
AST: 48 U/L — ABNORMAL HIGH (ref 15–41)
Albumin: 3.1 g/dL — ABNORMAL LOW (ref 3.5–5.0)
Alkaline Phosphatase: 135 U/L — ABNORMAL HIGH (ref 38–126)
Anion gap: 14 (ref 5–15)
BUN: 6 mg/dL — ABNORMAL LOW (ref 8–23)
CO2: 31 mmol/L (ref 22–32)
Calcium: 6.2 mg/dL — CL (ref 8.9–10.3)
Chloride: 92 mmol/L — ABNORMAL LOW (ref 98–111)
Creatinine, Ser: 0.93 mg/dL (ref 0.61–1.24)
GFR, Estimated: >60 mL/min (ref >60)
Glucose, Bld: 107 mg/dL — ABNORMAL HIGH (ref 70–99)
Potassium: 2.3 mmol/L — CL (ref 3.5–5.1)
Sodium: 137 mmol/L (ref 135–145)
Total Bilirubin: 1 mg/dL (ref 0.3–1.2)
Total Protein: 6.1 g/dL — ABNORMAL LOW (ref 6.5–8.1)

## 2021-09-30 LAB — B12 AND FOLATE PANEL
Folate: 2.8 ng/mL — ABNORMAL LOW (ref >5.9)
Vitamin B-12: 234 pg/mL (ref 211–911)

## 2021-09-30 LAB — MAGNESIUM
Magnesium: 0.6 mg/dL — CL (ref 1.5–2.5)
Magnesium: 0.7 mg/dL — CL (ref 1.7–2.4)

## 2021-09-30 LAB — BASIC METABOLIC PANEL
BUN: 9 mg/dL (ref 6–23)
CO2: 32 mEq/L (ref 19–32)
Calcium: 6.4 mg/dL — ABNORMAL LOW (ref 8.4–10.5)
Chloride: 91 mEq/L — ABNORMAL LOW (ref 96–112)
Creatinine, Ser: 0.94 mg/dL (ref 0.40–1.50)
GFR: 87.52 mL/min (ref >60.00)
Glucose, Bld: 114 mg/dL — ABNORMAL HIGH (ref 70–99)
Potassium: 2.4 mEq/L — CL (ref 3.5–5.1)
Sodium: 140 mEq/L (ref 135–145)

## 2021-09-30 LAB — C DIFFICILE QUICK SCREEN W PCR REFLEX
C Diff antigen: NEGATIVE
C Diff interpretation: NOT DETECTED
C Diff toxin: NEGATIVE

## 2021-09-30 LAB — CBC
HCT: 32.7 % — ABNORMAL LOW (ref 39.0–52.0)
Hemoglobin: 11.6 g/dL — ABNORMAL LOW (ref 13.0–17.0)
MCH: 33.8 pg (ref 26.0–34.0)
MCHC: 35.5 g/dL (ref 30.0–36.0)
MCV: 95.3 fL (ref 80.0–100.0)
Platelets: 279 10*3/uL (ref 150–400)
RBC: 3.43 MIL/uL — ABNORMAL LOW (ref 4.22–5.81)
RDW: 13.9 % (ref 11.5–15.5)
WBC: 5 10*3/uL (ref 4.0–10.5)
nRBC: 0 % (ref 0.0–0.2)

## 2021-09-30 MED ORDER — POTASSIUM CHLORIDE CRYS ER 20 MEQ PO TBCR
40.0000 meq | EXTENDED_RELEASE_TABLET | ORAL | Status: AC
  Administered 2021-09-30 (×2): 40 meq via ORAL
  Filled 2021-09-30 (×2): qty 2

## 2021-09-30 MED ORDER — LACTATED RINGERS IV SOLN
INTRAVENOUS | Status: DC
Start: 2021-09-30 — End: 2021-10-03

## 2021-09-30 MED ORDER — ACETAMINOPHEN 325 MG PO TABS: 650.0000 mg | ORAL_TABLET | Freq: Four times a day (QID) | ORAL | Status: DC | PRN

## 2021-09-30 MED ORDER — ENOXAPARIN SODIUM 40 MG/0.4ML IJ SOSY
40.0000 mg | PREFILLED_SYRINGE | INTRAMUSCULAR | Status: DC
  Administered 2021-09-30 – 2021-10-02 (×3): 40 mg via SUBCUTANEOUS
  Filled 2021-09-30 (×3): qty 0.4

## 2021-09-30 MED ORDER — ACETAMINOPHEN 650 MG RE SUPP: 650.0000 mg | Freq: Four times a day (QID) | RECTAL | Status: DC | PRN

## 2021-09-30 MED ORDER — MAGNESIUM SULFATE 4 GM/100ML IV SOLN
4.0000 g | Freq: Once | INTRAVENOUS | Status: AC
  Administered 2021-09-30: 4 g via INTRAVENOUS
  Filled 2021-09-30: qty 100

## 2021-09-30 MED ORDER — OXYCODONE HCL 5 MG PO TABS
5.0000 mg | ORAL_TABLET | ORAL | Status: DC | PRN
  Administered 2021-10-03: 5 mg via ORAL
  Filled 2021-09-30: qty 1

## 2021-09-30 MED ORDER — SODIUM CHLORIDE 0.9% FLUSH
3.0000 mL | Freq: Two times a day (BID) | INTRAVENOUS | Status: DC
  Administered 2021-09-30 – 2021-10-02 (×4): 3 mL via INTRAVENOUS

## 2021-09-30 MED ORDER — MAGNESIUM OXIDE -MG SUPPLEMENT 400 (240 MG) MG PO TABS
400.0000 mg | ORAL_TABLET | Freq: Once | ORAL | Status: AC
  Administered 2021-09-30: 400 mg via ORAL
  Filled 2021-09-30: qty 1

## 2021-09-30 MED ORDER — HYDRALAZINE HCL 20 MG/ML IJ SOLN: 5.0000 mg | INTRAMUSCULAR | Status: DC | PRN

## 2021-09-30 MED ORDER — POTASSIUM CHLORIDE 10 MEQ/100ML IV SOLN
10.0000 meq | Freq: Once | INTRAVENOUS | Status: AC
  Administered 2021-09-30: 10 meq via INTRAVENOUS
  Filled 2021-09-30: qty 100

## 2021-09-30 MED ORDER — CALCIUM GLUCONATE-NACL 1-0.675 GM/50ML-% IV SOLN
1.0000 g | Freq: Once | INTRAVENOUS | Status: AC
Start: 2021-09-30 — End: 2021-09-30
  Administered 2021-09-30: 1000 mg via INTRAVENOUS
  Filled 2021-09-30: qty 50

## 2021-09-30 MED ORDER — SODIUM CHLORIDE 0.9 % IV SOLN: INTRAVENOUS | Status: DC

## 2021-09-30 MED ORDER — POTASSIUM CHLORIDE CRYS ER 20 MEQ PO TBCR
40.0000 meq | EXTENDED_RELEASE_TABLET | Freq: Once | ORAL | Status: AC
Start: 2021-09-30 — End: 2021-09-30
  Administered 2021-09-30: 40 meq via ORAL
  Filled 2021-09-30: qty 2

## 2021-09-30 NOTE — ED Triage Notes (Signed)
Pt arrived POV from work stating he had an appointment yesterday with his dr and had blood work done pt states they called today and told him to come to the ER that his blood work was abnormal but he cannot remember what they told him was wrong. Pt denies any pain.

## 2021-09-30 NOTE — ED Provider Triage Note (Signed)
Emergency Medicine Provider Triage Evaluation Note  Richard Gates , a 61 y.o. male  was evaluated in triage.  Pt complains of abnormal labs.  Patient had labs drawn yesterday at PCP, noted to have low magnesium and potassium.  Per PCP note, this is a longstanding history of hypokalemia.  The patient is endorsing anorexia, weakness, muscle cramps, postprandial nausea and vomiting.  Patient denies chest pain, shortness of breath.  Patient denies fevers.  Review of Systems  Positive:  Negative:   Physical Exam  BP (!) 140/98 (BP Location: Right Arm)   Pulse (!) 117   Temp 99.3 F (37.4 C) (Oral)   Resp 16   Ht '5\' 8"'$  (1.727 m)   Wt 69.9 kg   SpO2 93%   BMI 23.42 kg/m  Gen:   Awake, no distress   Resp:  Normal effort  MSK:   Moves extremities without difficulty  Other:  Lung sounds clear.  No focal neurodeficits.  Medical Decision Making  Medically screening exam initiated at 2:05 PM.  Appropriate orders placed.  Richard Gates was informed that the remainder of the evaluation will be completed by another provider, this initial triage assessment does not replace that evaluation, and the importance of remaining in the ED until their evaluation is complete.     Azucena Cecil, PA-C 09/30/21 1406

## 2021-09-30 NOTE — Telephone Encounter (Signed)
Also, at the request of the patient I talk with his boss, and explained the need to go to the ER

## 2021-09-30 NOTE — Telephone Encounter (Signed)
PCP also called and reinforced need to go to [facility] ED now.

## 2021-09-30 NOTE — Progress Notes (Signed)
Patient admitted to 4e-02, vitals taken, orders reviewed.

## 2021-09-30 NOTE — Telephone Encounter (Signed)
CRITICAL VALUE STICKER  CRITICAL VALUE: Magnesium 0.6, Potassium 2.4, Calcium 6.4  RECEIVER (on-site recipient of call): Shiela Bruns, RMA   DATE & TIME NOTIFIED: 09/30/2021 at 12:26 pm  MESSENGER (representative from lab): Hope Scales  MD NOTIFIED: Kathlene November, MD   TIME OF NOTIFICATION:1:16 pm  RESPONSE:

## 2021-09-30 NOTE — Telephone Encounter (Signed)
The patient has a long history of hypokalemia, labs from yesterday showed a potassium of 2.4, anion gap 19, magnesium 0.6. He is symptomatic with anorexia, nausea and postprandial vomiting. Case discussed with nephrology, Dr. Carolin Sicks, he needs attention immediately, recommend ER. Please advise patient to go to [facility] ER for further evaluation.

## 2021-09-30 NOTE — Telephone Encounter (Signed)
Spoke w/ Pt- informed of results and recommendations. Pt states he understands, however I'm unsure if he fully understands.

## 2021-09-30 NOTE — H&P (Signed)
History and Physical    Patient: Richard Gates QBH:[id number] DOB: 1961-02-07 DOA: 09/30/2021 DOS: the patient was seen and examined on 09/30/2021 PCP: Colon Branch, MD  Patient coming from: Home - lives with wife; NOK: Wife, [phone]   Chief Complaint: Abnormal labs  HPI: Richard Gates is a 61 y.o. male with medical history significant of COPD, HTN, and HLD presenting with abnormal labs. He reports that he had a PCP appointment yesterday.  He reported that for the last month he doesn't want to eat and it doesn't taste right.  He hasn't been eating well and when he did make himself eat he would have diarrhea.  He sent him for blood work and he called him today and told him to come to the ER ASAP.  No prior h/o electrolyte issues.  No abdominal pain.  For the last month, he hasn't been eating but has diarrhea 5-8 times a day.  No blood.  Small stools.  His last colonoscopy was 2 years ago and he had a number of polyps.  He was seen for 4 months of diarrhea in January with GI; it got better and just recently started back.  No weight loss, has actually gained some weight.    ER Course:  Generalized weakness, diarrhea x 1 month, abnormal labs.  K+, Mag++ low, calcium low.  All repleted.  EKG with QTc prolongation.  Diarrhea 4-5x/day.     Review of Systems: As mentioned in the history of present illness. All other systems reviewed and are negative. Past Medical History:  Diagnosis Date   COPD (chronic obstructive pulmonary disease) ([place])    Coronary artery calcification seen on CAT scan 08/2019   Coronary calcium score 103; short LM with<25% mixed calcific plaque (minimal); aortic atherosclerosis with normal size.  No dissection.  No aortic valve calcification   GERD (gastroesophageal reflux disease)    Hyperlipidemia    Hypertension    Past Surgical History:  Procedure Laterality Date   APPENDECTOMY     COLONOSCOPY  08/24/2016   POLYPECTOMY     TRANSTHORACIC ECHOCARDIOGRAM   04/2018   EF 65-70% (vigorous). No RWMA.  Gr 1 DD.  Normal valves.  (In setting of COPD exacerbation)   Social History:  reports that he quit smoking about 3 years ago. His smoking use included cigarettes. He has a 30.75 pack-year smoking history. He has never used smokeless tobacco. He reports current alcohol use. He reports that he does not currently use drugs after having used the following drugs: Marijuana.  No Known Allergies  Family History  Problem Relation Age of Onset   Stroke Father        July 4268: complicated by hemmrhage -- lost eyesight, memory loss   Hypertension Other    Heart disease Paternal Uncle    Breast cancer Sister    Heart disease Paternal Aunt        surgery   Heart attack Maternal Uncle    Colon cancer Neg Hx    Prostate cancer Neg Hx    Colon polyps Neg Hx    Esophageal cancer Neg Hx    Stomach cancer Neg Hx    Rectal cancer Neg Hx     Prior to Admission medications   Medication Sig Start Date End Date Taking? Authorizing Provider  albuterol (VENTOLIN HFA) 108 (90 Base) MCG/ACT inhaler INHALE 2 PUFFS INTO THE LUNGS EVERY 6 HOURS AS NEEDED FOR WHEEZING OR SHORTNESS OF BREATH Patient taking differently: 2 puffs every  6 (six) hours as needed for wheezing or shortness of breath. 04/13/21   Colon Branch, MD  amLODipine (NORVASC) 10 MG tablet Take 1 tablet (10 mg total) by mouth daily. 05/12/21   Colon Branch, MD  aspirin EC 81 MG tablet Take 81 mg by mouth daily.    [provider]  atorvastatin (LIPITOR) 80 MG tablet Take 1 tablet (80 mg total) by mouth daily. 04/13/21   Colon Branch, MD  ELIQUIS 5 MG TABS tablet Take 5 mg by mouth 2 (two) times daily. 09/29/21   [provider]  famotidine (PEPCID) 20 MG tablet TAKE 1 TABLET(20 MG) BY MOUTH DAILY Patient taking differently: Take 20 mg by mouth daily. 08/01/21   Noralyn Pick, NP  fluticasone-salmeterol (ADVAIR) 250-50 MCG/ACT AEPB Inhale 1 puff into the lungs in the morning and at  bedtime. 06/04/21   Colon Branch, MD  metoprolol tartrate (LOPRESSOR) 100 MG tablet Take 1 tablet (100 mg total) by mouth 2 (two) times daily. 05/26/21   Colon Branch, MD  pantoprazole (PROTONIX) 40 MG tablet TAKE 1 TABLET(40 MG) BY MOUTH DAILY Patient taking differently: Take 40 mg by mouth daily. 07/01/21   Colon Branch, MD    Physical Exam: Vitals:   09/30/21 1459 09/30/21 1500 09/30/21 1515 09/30/21 1545  BP:  135/81 (!) 146/86 127/88  Pulse: 100 97 97 96  Resp: '14 12 18 10  '$ Temp:      TempSrc:      SpO2: 99% 99% 98% 97%  Weight:      Height:       General:  Appears calm and comfortable and is in NAD Eyes:  PERRL, EOMI, normal lids, iris ENT:  grossly normal hearing, lips & tongue, mmm Neck:  no LAD, masses or thyromegaly Cardiovascular:  RRR, no m/r/g. No LE edema.  Respiratory:   CTA bilaterally with no wheezes/rales/rhonchi.  Normal respiratory effort. Abdomen:  soft, NT, ND Skin:  no rash or induration seen on limited exam Musculoskeletal:  grossly normal tone BUE/BLE, good ROM, no bony abnormality Psychiatric:  grossly normal mood and affect, speech fluent and appropriate, AOx3 Neurologic:  CN 2-12 grossly intact, moves all extremities in coordinated fashion   Radiological Exams on Admission: Independently reviewed - see discussion in A/P where applicable  No results found.  EKG: Independently reviewed.  Sinus tachycardia with rate 120; nonspecific ST changes with no evidence of acute ischemia; prolonged QTc (longer than estimated by machine)   Labs on Admission: I have personally reviewed the available labs and imaging studies at the time of the admission.  Pertinent labs:    K+ 2.3; normal in 06/2021 Calcium 6.2; normal to low-normal in 06/2021 Mag++ 0.7; 1.5 on 2/1 Albumin 3.1 AST 48/ALT 20 Unremarkable CBC, Hgb stable   Assessment and Plan: Principal Problem:   Electrolyte disturbance Active Problems:   Hyperlipidemia with target LDL less than 70   COPD  mixed type (H[facility])   Hypertension   Diarrhea   Prolonged QT interval    Electrolyte disturbance -Patient with recurrent diarrhea and reported chronically poor appetite but normal renal function presenting with markedly abnormal electrolytes -K+ was 2.3, Mag++ 0.7, and Ca++ 6.2 -He also had a prolonged QTc on EKG visually, but this appears to be improved on telemetry now -Will correct and recheck electrolytes in the AM  Diarrhea -Chronic in nature, and recurrent since he also was seen for this in January -C diff and GI pathogen panel are  pending -Will ask GI to consult -Previously had polyps on C-scope 2 years ago, does not appear to need repeat colonoscopy -Will make NPO after MN in case of need for EGD -Clear liquids for now  COPD -Hold Advair and Albuterol in the setting of QTc prolongation  HTN -Previously taking amlodipine, metoprolol but patient stopped taking -Currently normotensive, will follow  HLD -Previously taking Lipitor and also stopped taking this -PCP f/u encouraged  Prolonged QTc -Likely associated with electrolyte abnormalities, anticipate resolution once this is normalized -Will attempt to avoid QT-prolonging medications such as PPI, nausea meds, SSRIs -Repeat EKG in AM    Advance Care Planning:   Code Status: Full Code   Consults: GI; nutrition; TOC team  DVT Prophylaxis: Lovenox  Family Communication: None present; he is capable of communicating with his family at this time  Severity of Illness: The appropriate patient status for this patient is OBSERVATION. Observation status is judged to be reasonable and necessary in order to provide the required intensity of service to ensure the patient's safety. The patient's presenting symptoms, physical exam findings, and initial radiographic and laboratory data in the context of their medical condition is felt to place them at decreased risk for further clinical deterioration. Furthermore, it is anticipated  that the patient will be medically stable for discharge from the hospital within 2 midnights of admission.   Author: Karmen Bongo, MD 09/30/2021 4:47 PM  For on call review [email or web address].

## 2021-09-30 NOTE — Assessment & Plan Note (Signed)
Seizure activity: Admitted to hospital few months ago, no further activity HTN, increased creatinine,  low K+: See previous entries, again referral to nephrology failed, he reports he got a phone call from nephrology but simply forgot about it. Currently on metoprolol, amlodipine.  We will check a BMP, magnesium. DVT, L leg, DX 05/31/2021: Upon admission to the hospital 05-2021, a leg [place] was (-) for DVT, the next day a [place] was repeated and he was found to have a left peroneal DVT.  This is his first DVT, below the knee, he has been anticoagulated for 4 months.  Okay to stop anticoagulation. Restart aspirin 81 mg Chronic diarrhea: Ongoing issue, see HPI, refer to GI B12 deficiency: Last shot was February 27, not on OTCs, checking labs.    Depression:PHQ-9 score 12, he admits to some depression, denies suicidal ideas.  Seems to be work-related.  He did not like to open up too much about the issue.  Recommend to see a counselor. RTC 3 months

## 2021-09-30 NOTE — Telephone Encounter (Signed)
Provider aware- see telephone note.

## 2021-09-30 NOTE — ED Provider Notes (Signed)
[facility] EMERGENCY DEPARTMENT Provider Note   CSN: [id number] Arrival date & time: 09/30/21  1348     History  Chief Complaint  Patient presents with   abnormal labs     Richard Gates is a 61 y.o. male.  Patient presents to ER chief complaint of generalized weakness fatigue diarrhea and abnormal labs.  Symptoms of been ongoing for about a month approximately 5-6 episodes of diarrhea a day.  Denies any recent travel or recent antibiotic use.  Denies any abdominal pain denies vomiting or nausea.  He saw his primary care doctor today who had labs ordered and was advised to go to the ER due to abnormal labs.  Otherwise denies fevers or cough no headache no chest pain no abdominal pain.      Home Medications Prior to Admission medications   Medication Sig Start Date End Date Taking? Authorizing Provider  albuterol (VENTOLIN HFA) 108 (90 Base) MCG/ACT inhaler INHALE 2 PUFFS INTO THE LUNGS EVERY 6 HOURS AS NEEDED FOR WHEEZING OR SHORTNESS OF BREATH Patient taking differently: 2 puffs every 6 (six) hours as needed for wheezing or shortness of breath. 04/13/21   Colon Branch, MD  amLODipine (NORVASC) 10 MG tablet Take 1 tablet (10 mg total) by mouth daily. 05/12/21   Colon Branch, MD  aspirin EC 81 MG tablet Take 81 mg by mouth daily.    [provider]  atorvastatin (LIPITOR) 80 MG tablet Take 1 tablet (80 mg total) by mouth daily. 04/13/21   Colon Branch, MD  ELIQUIS 5 MG TABS tablet Take 5 mg by mouth 2 (two) times daily. 09/29/21   [provider]  famotidine (PEPCID) 20 MG tablet TAKE 1 TABLET(20 MG) BY MOUTH DAILY Patient taking differently: Take 20 mg by mouth daily. 08/01/21   Noralyn Pick, NP  fluticasone-salmeterol (ADVAIR) 250-50 MCG/ACT AEPB Inhale 1 puff into the lungs in the morning and at bedtime. 06/04/21   Colon Branch, MD  metoprolol tartrate (LOPRESSOR) 100 MG tablet Take 1 tablet (100 mg total) by mouth 2 (two) times daily.  05/26/21   Colon Branch, MD  pantoprazole (PROTONIX) 40 MG tablet TAKE 1 TABLET(40 MG) BY MOUTH DAILY Patient taking differently: Take 40 mg by mouth daily. 07/01/21   Colon Branch, MD      Allergies    Patient has no known allergies.    Review of Systems   Review of Systems  Constitutional:  Negative for fever.  HENT:  Negative for ear pain and sore throat.   Eyes:  Negative for pain.  Respiratory:  Negative for cough.   Cardiovascular:  Negative for chest pain.  Gastrointestinal:  Negative for abdominal pain.  Genitourinary:  Negative for flank pain.  Musculoskeletal:  Negative for back pain.  Skin:  Negative for color change and rash.  Neurological:  Negative for syncope.  All other systems reviewed and are negative.  Physical Exam Updated Vital Signs BP (!) 146/86   Pulse 97   Temp 99.3 F (37.4 C) (Oral)   Resp 18   Ht '5\' 8"'$  (1.727 m)   Wt 69.9 kg   SpO2 98%   BMI 23.42 kg/m  Physical Exam Constitutional:      Appearance: He is well-developed.  HENT:     Head: Normocephalic.     Nose: Nose normal.  Eyes:     Extraocular Movements: Extraocular movements intact.  Cardiovascular:     Rate and Rhythm: Normal  rate.  Pulmonary:     Effort: Pulmonary effort is normal.  Skin:    Coloration: Skin is not jaundiced.  Neurological:     General: No focal deficit present.     Mental Status: He is alert and oriented to person, place, and time. Mental status is at baseline.     Cranial Nerves: No cranial nerve deficit.     Motor: No weakness.    ED Results / Procedures / Treatments   Labs (all labs ordered are listed, but only abnormal results are displayed) Labs Reviewed  MAGNESIUM - Abnormal; Notable for the following components:      Result Value   Magnesium 0.7 (*)    All other components within normal limits  CBC - Abnormal; Notable for the following components:   RBC 3.43 (*)    Hemoglobin 11.6 (*)    HCT 32.7 (*)    All other components within normal limits   COMPREHENSIVE METABOLIC PANEL - Abnormal; Notable for the following components:   Potassium 2.3 (*)    Chloride 92 (*)    Glucose, Bld 107 (*)    BUN 6 (*)    Calcium 6.2 (*)    Total Protein 6.1 (*)    Albumin 3.1 (*)    AST 48 (*)    Alkaline Phosphatase 135 (*)    All other components within normal limits  C DIFFICILE QUICK SCREEN W PCR REFLEX    GASTROINTESTINAL PANEL BY PCR, STOOL (REPLACES STOOL CULTURE)    EKG EKG Interpretation  Date/Time:  Thursday September 30 2021 13:59:21 EDT Ventricular Rate:  120 PR Interval:  146 QRS Duration: 84 QT Interval:  348 QTC Calculation: 491 R Axis:   62 Text Interpretation: Sinus tachycardia Cannot rule out Inferior infarct , age undetermined Abnormal ECG When compared with ECG of 30-May-2021 19:43, PREVIOUS ECG IS PRESENT Confirmed by Thamas Jaegers ([id number]) on 09/30/2021 2:14:21 PM  Radiology No results found.  Procedures .Critical Care Performed by: Luna Fuse, MD Authorized by: Luna Fuse, MD   Critical care provider statement:    Critical care time (minutes):  30   Critical care time was exclusive of:  Separately billable procedures and treating other patients   Critical care was necessary to treat or prevent imminent or life-threatening deterioration of the following conditions:  Metabolic crisis    Medications Ordered in ED Medications  calcium gluconate 1 g/ 50 mL sodium chloride IVPB (1,000 mg Intravenous New Bag/Given 09/30/21 1528)  potassium chloride 10 mEq in 100 mL IVPB (10 mEq Intravenous New Bag/Given 09/30/21 1527)  magnesium oxide (MAG-OX) tablet 400 mg (400 mg Oral Given 09/30/21 1525)  potassium chloride SA (KLOR-CON M) CR tablet 40 mEq (40 mEq Oral Given 09/30/21 1525)    ED Course/ Medical Decision Making/ A&P                           Medical Decision Making Risk OTC drugs. Prescription drug management.   Patient placed on cardiac monitor and ZOLL pads.  EKG is concerning for prolonged QT otherwise  rate is normal no ST elevations or depressions noted.  Chart review shows prior visit to primary care doctor's office yesterday with abnormal labs noted.  Diagnostic studies include repeat labs.  Potassium remains low at 2.3, magnesium also low at 0.6, calcium also low.  Likely due to his diarrhea.  Patient started on IV calcium, magnesium and serum replacement.  Due to EKG  changes and multiple electrolyte abnormalities, will be admitted to the hospitalist team.  Continues on cardiac monitoring no additional arrhythmias noted.        Final Clinical Impression(s) / ED Diagnoses Final diagnoses:  None    Rx / DC Orders ED Discharge Orders     None         Luna Fuse, MD 09/30/21 [phone]

## 2021-09-30 NOTE — Plan of Care (Signed)

## 2021-10-01 ENCOUNTER — Inpatient Hospital Stay (HOSPITAL_COMMUNITY): Payer: No Typology Code available for payment source

## 2021-10-01 DIAGNOSIS — N189 Chronic kidney disease, unspecified: Secondary | ICD-10-CM | POA: Diagnosis present

## 2021-10-01 DIAGNOSIS — K2289 Other specified disease of esophagus: Secondary | ICD-10-CM | POA: Diagnosis not present

## 2021-10-01 DIAGNOSIS — K2980 Duodenitis without bleeding: Secondary | ICD-10-CM | POA: Diagnosis not present

## 2021-10-01 DIAGNOSIS — K648 Other hemorrhoids: Secondary | ICD-10-CM | POA: Diagnosis present

## 2021-10-01 DIAGNOSIS — Z86718 Personal history of other venous thrombosis and embolism: Secondary | ICD-10-CM | POA: Diagnosis not present

## 2021-10-01 DIAGNOSIS — J449 Chronic obstructive pulmonary disease, unspecified: Secondary | ICD-10-CM | POA: Diagnosis present

## 2021-10-01 DIAGNOSIS — K2970 Gastritis, unspecified, without bleeding: Secondary | ICD-10-CM | POA: Diagnosis present

## 2021-10-01 DIAGNOSIS — I129 Hypertensive chronic kidney disease with stage 1 through stage 4 chronic kidney disease, or unspecified chronic kidney disease: Secondary | ICD-10-CM | POA: Diagnosis present

## 2021-10-01 DIAGNOSIS — E785 Hyperlipidemia, unspecified: Secondary | ICD-10-CM | POA: Diagnosis present

## 2021-10-01 DIAGNOSIS — E876 Hypokalemia: Secondary | ICD-10-CM | POA: Diagnosis present

## 2021-10-01 DIAGNOSIS — E538 Deficiency of other specified B group vitamins: Secondary | ICD-10-CM | POA: Diagnosis present

## 2021-10-01 DIAGNOSIS — K449 Diaphragmatic hernia without obstruction or gangrene: Secondary | ICD-10-CM | POA: Diagnosis not present

## 2021-10-01 DIAGNOSIS — R748 Abnormal levels of other serum enzymes: Secondary | ICD-10-CM | POA: Diagnosis not present

## 2021-10-01 DIAGNOSIS — Z87891 Personal history of nicotine dependence: Secondary | ICD-10-CM | POA: Diagnosis not present

## 2021-10-01 DIAGNOSIS — B3781 Candidal esophagitis: Secondary | ICD-10-CM | POA: Diagnosis present

## 2021-10-01 DIAGNOSIS — I2510 Atherosclerotic heart disease of native coronary artery without angina pectoris: Secondary | ICD-10-CM | POA: Diagnosis present

## 2021-10-01 DIAGNOSIS — E878 Other disorders of electrolyte and fluid balance, not elsewhere classified: Secondary | ICD-10-CM

## 2021-10-01 DIAGNOSIS — R933 Abnormal findings on diagnostic imaging of other parts of digestive tract: Secondary | ICD-10-CM

## 2021-10-01 DIAGNOSIS — Z7982 Long term (current) use of aspirin: Secondary | ICD-10-CM | POA: Diagnosis not present

## 2021-10-01 DIAGNOSIS — Z8249 Family history of ischemic heart disease and other diseases of the circulatory system: Secondary | ICD-10-CM | POA: Diagnosis not present

## 2021-10-01 DIAGNOSIS — K2100 Gastro-esophageal reflux disease with esophagitis, without bleeding: Secondary | ICD-10-CM | POA: Diagnosis present

## 2021-10-01 DIAGNOSIS — K6389 Other specified diseases of intestine: Secondary | ICD-10-CM | POA: Diagnosis not present

## 2021-10-01 DIAGNOSIS — K529 Noninfective gastroenteritis and colitis, unspecified: Secondary | ICD-10-CM | POA: Diagnosis not present

## 2021-10-01 DIAGNOSIS — Z79899 Other long term (current) drug therapy: Secondary | ICD-10-CM | POA: Diagnosis not present

## 2021-10-01 DIAGNOSIS — K259 Gastric ulcer, unspecified as acute or chronic, without hemorrhage or perforation: Secondary | ICD-10-CM | POA: Diagnosis not present

## 2021-10-01 DIAGNOSIS — K5730 Diverticulosis of large intestine without perforation or abscess without bleeding: Secondary | ICD-10-CM | POA: Diagnosis present

## 2021-10-01 DIAGNOSIS — K2990 Gastroduodenitis, unspecified, without bleeding: Secondary | ICD-10-CM | POA: Diagnosis present

## 2021-10-01 LAB — CBC
HCT: 29.9 % — ABNORMAL LOW (ref 39.0–52.0)
Hemoglobin: 10.2 g/dL — ABNORMAL LOW (ref 13.0–17.0)
MCH: 33 pg (ref 26.0–34.0)
MCHC: 34.1 g/dL (ref 30.0–36.0)
MCV: 96.8 fL (ref 80.0–100.0)
Platelets: 245 10*3/uL (ref 150–400)
RBC: 3.09 MIL/uL — ABNORMAL LOW (ref 4.22–5.81)
RDW: 14.1 % (ref 11.5–15.5)
WBC: 6.1 10*3/uL (ref 4.0–10.5)
nRBC: 0 % (ref 0.0–0.2)

## 2021-10-01 LAB — HEPATIC FUNCTION PANEL
ALT: 18 U/L (ref 0–44)
AST: 37 U/L (ref 15–41)
Albumin: 2.9 g/dL — ABNORMAL LOW (ref 3.5–5.0)
Alkaline Phosphatase: 126 U/L (ref 38–126)
Bilirubin, Direct: 0.2 mg/dL (ref 0.0–0.2)
Indirect Bilirubin: 0.7 mg/dL (ref 0.3–0.9)
Total Bilirubin: 0.9 mg/dL (ref 0.3–1.2)
Total Protein: 5.7 g/dL — ABNORMAL LOW (ref 6.5–8.1)

## 2021-10-01 LAB — BASIC METABOLIC PANEL
Anion gap: 8 (ref 5–15)
BUN: <5 mg/dL — ABNORMAL LOW (ref 8–23)
CO2: 30 mmol/L (ref 22–32)
Calcium: 6.4 mg/dL — CL (ref 8.9–10.3)
Chloride: 98 mmol/L (ref 98–111)
Creatinine, Ser: 0.78 mg/dL (ref 0.61–1.24)
GFR, Estimated: >60 mL/min (ref >60)
Glucose, Bld: 108 mg/dL — ABNORMAL HIGH (ref 70–99)
Potassium: 2.5 mmol/L — CL (ref 3.5–5.1)
Sodium: 136 mmol/L (ref 135–145)

## 2021-10-01 LAB — GASTROINTESTINAL PANEL BY PCR, STOOL (REPLACES STOOL CULTURE)

## 2021-10-01 LAB — PROTIME-INR
INR: 1.2 (ref 0.8–1.2)
Prothrombin Time: 14.6 seconds (ref 11.4–15.2)

## 2021-10-01 LAB — HEPATITIS B CORE ANTIBODY, TOTAL: Hep B Core Total Ab: NONREACTIVE

## 2021-10-01 LAB — HEPATITIS A ANTIBODY, TOTAL: hep A Total Ab: NONREACTIVE

## 2021-10-01 LAB — MAGNESIUM: Magnesium: 1.7 mg/dL (ref 1.7–2.4)

## 2021-10-01 LAB — HEPATITIS C ANTIBODY: HCV Ab: NONREACTIVE

## 2021-10-01 LAB — GAMMA GT: GGT: 107 U/L — ABNORMAL HIGH (ref 7–50)

## 2021-10-01 LAB — PHOSPHORUS: Phosphorus: 1.5 mg/dL — ABNORMAL LOW (ref 2.5–4.6)

## 2021-10-01 LAB — HEPATITIS B SURFACE ANTIGEN: Hepatitis B Surface Ag: NONREACTIVE

## 2021-10-01 MED ORDER — ADULT MULTIVITAMIN W/MINERALS CH
1.0000 | ORAL_TABLET | Freq: Every day | ORAL | Status: DC
  Administered 2021-10-02 – 2021-10-03 (×3): 1 via ORAL
  Filled 2021-10-01 (×2): qty 1

## 2021-10-01 MED ORDER — CALCIUM GLUCONATE-NACL 1-0.675 GM/50ML-% IV SOLN
1.0000 g | Freq: Once | INTRAVENOUS | Status: AC
  Administered 2021-10-01: 1000 mg via INTRAVENOUS
  Filled 2021-10-01: qty 50

## 2021-10-01 MED ORDER — FOLIC ACID 1 MG PO TABS
1.0000 mg | ORAL_TABLET | Freq: Every day | ORAL | Status: DC
  Administered 2021-10-02 – 2021-10-03 (×2): 1 mg via ORAL
  Filled 2021-10-01 (×2): qty 1

## 2021-10-01 MED ORDER — POTASSIUM CHLORIDE 10 MEQ/100ML IV SOLN
10.0000 meq | INTRAVENOUS | Status: AC
  Administered 2021-10-01: 10 meq via INTRAVENOUS

## 2021-10-01 MED ORDER — BOOST / RESOURCE BREEZE PO LIQD CUSTOM
1.0000 | Freq: Three times a day (TID) | ORAL | Status: DC
  Administered 2021-10-01 – 2021-10-02 (×3): 1 via ORAL

## 2021-10-01 MED ORDER — POTASSIUM CHLORIDE CRYS ER 20 MEQ PO TBCR
40.0000 meq | EXTENDED_RELEASE_TABLET | Freq: Once | ORAL | Status: AC
Start: 2021-10-01 — End: 2021-10-01
  Administered 2021-10-01: 40 meq via ORAL
  Filled 2021-10-01: qty 2

## 2021-10-01 MED ORDER — PEG-KCL-NACL-NASULF-NA ASC-C 100 G PO SOLR
0.5000 | Freq: Once | ORAL | Status: AC
  Administered 2021-10-01: 100 g via ORAL
  Filled 2021-10-01: qty 1

## 2021-10-01 MED ORDER — POTASSIUM CHLORIDE 10 MEQ/100ML IV SOLN
10.0000 meq | INTRAVENOUS | Status: AC
  Administered 2021-10-01 (×2): 10 meq via INTRAVENOUS
  Filled 2021-10-01 (×3): qty 100

## 2021-10-01 MED ORDER — PEG-KCL-NACL-NASULF-NA ASC-C 100 G PO SOLR: 1.0000 | Freq: Once | ORAL | Status: DC

## 2021-10-01 MED ORDER — K PHOS MONO-SOD PHOS DI & MONO 155-852-130 MG PO TABS
500.0000 mg | ORAL_TABLET | Freq: Once | ORAL | Status: AC
  Administered 2021-10-01: 500 mg via ORAL
  Filled 2021-10-01: qty 2

## 2021-10-01 MED ORDER — IOHEXOL 300 MG/ML  SOLN
85.0000 mL | Freq: Once | INTRAMUSCULAR | Status: AC | PRN
  Administered 2021-10-01: 85 mL via INTRAVENOUS

## 2021-10-01 NOTE — Plan of Care (Signed)

## 2021-10-01 NOTE — Progress Notes (Signed)
Critical lab values K+2.5 and Calcium 6.4 called by RN to Dr. Myna Hidalgo. Awaiting new orders

## 2021-10-01 NOTE — Progress Notes (Addendum)
Initial Nutrition Assessment  DOCUMENTATION CODES:   Not applicable  INTERVENTION:   Boost Breeze po TID, each supplement provides 250 kcal and 9 grams of protein  Ensure Enlive po TID with diet advancement, each supplement provides 350 kcal and 20 grams of protein.  MVI po daily  Folic acid '1mg'$  po daily   Pt at high refeed risk; recommend monitor potassium, magnesium and phosphorus labs daily until stable  Check vitamin D and zinc labs  NUTRITION DIAGNOSIS:   Inadequate oral intake related to acute illness as evidenced by per patient/family report.  GOAL:   Patient will meet greater than or equal to 90% of their needs  MONITOR:   PO intake, Supplement acceptance, Diet advancement, Labs, Weight trends, Skin, I & O's  REASON FOR ASSESSMENT:   Consult Assessment of nutrition requirement/status  ASSESSMENT:   61 year old male with a past medical history of hypertension, hyperlipidemia, coronary artery calcifications per CT, hepatic steatosis, GERD symptoms, B12 deficiency, anemia, folate deficiency, LLE DVT 05/2021 (Eliquis was prescribed x 6 months, patient took for only 2 months), COPD, seizure like activity 06/2021, CKD and colon polyps who is admitted with chronic diarrhea and electrolyte abnormalty.  RD working remotely.  Per chart review, pt reports poor appetite and oral intake for 1 month pta r/t taste changes and fear of recurring diarrhea. Pt reports that when he does force himself to eat, he just gets diarrhea afterwards. Pt reports diarrhea for the past 8 months. CT scan suggesting possible chronic colitis. Per chart, pt is down 28lbs(16%) over the past 6 months; this is severe weight loss. Pt is eating 100% of his clear liquid diet in hospital. RD will add supplements and MVI to help pt meet his estimated needs. Pt is at high refeed risk. Pt currently on clear liquids with plans for EGD/colonoscopy tomorrow. Of note, pt with B12 and folate deficiency. Will check  vitamin D and zinc labs as deficiency can also lead to chronic diarrhea. Pt is at high risk for malnutrition. RD will obtain nutrition related history and exam at follow-up.   Medications reviewed and include: lovenox, Kphos, LRS '@75ml'$ /hr   Labs reviewed: K 2.5(L), BUN <5(L), P 1.5(L), Mg 1.7 wnl Hgb 10.2(L), Hct 29.9(L) B12- 234- 5/31 Folate- 2.8(L)- 5/31  NUTRITION - FOCUSED PHYSICAL EXAM: Unable to perform at this time   Diet Order:   Diet Order             Diet NPO time specified Except for: Sips with Meds  Diet effective midnight           Diet clear liquid Room service appropriate? Yes; Fluid consistency: Thin  Diet effective now                  EDUCATION NEEDS:   No education needs have been identified at this time  Skin:  Skin Assessment: Reviewed RN Assessment  Last BM:  6/2- type 7  Height:   Ht Readings from Last 1 Encounters:  09/30/21 '5\' 8"'$  (1.727 m)    Weight:   Wt Readings from Last 1 Encounters:  09/30/21 66.2 kg    Ideal Body Weight:  70 kg  BMI:  Body mass index is 22.19 kg/m.  Estimated Nutritional Needs:   Kcal:  1900-2200kcal/day  Protein:  95-110g/day  Fluid:  2.0-2.3L/day  Koleen Distance MS, RD, LDN Please refer to [facility] for RD and/or RD on-call/weekend/after hours pager

## 2021-10-01 NOTE — Progress Notes (Signed)
OT Cancellation Note  Patient Details Name: Richard Gates MRN: [id number] DOB: June 14, 1960   Cancelled Treatment:    Reason Eval/Treat Not Completed: Patient at procedure or test/ unavailable. Pt currently down for CT of abdomen.   [place], OTR/L [facility] [phone] Office [phone] '   Almon Register 10/01/2021, 2:53 PM

## 2021-10-01 NOTE — Progress Notes (Signed)
Potassium and calcium are critically-low this am. Plan to replace potassium and calcium, add-on magnesium level.

## 2021-10-01 NOTE — Progress Notes (Signed)
24 hour urine collection start at 1035.  Raelyn Number, RN

## 2021-10-01 NOTE — Progress Notes (Signed)
Stool sample sent resulted c-diff negative. Enteric precautions discontinued

## 2021-10-01 NOTE — Progress Notes (Deleted)
Critical lab value of K+ 6.3 called to Dr. Myna Hidalgo. New orders for EKG. Patient is due for dialysis today as well. Fraizer Canada, RN

## 2021-10-01 NOTE — Progress Notes (Signed)
Patient receiving 3 runs IV K+. 40 meQ PO K+ also given. Patient also receiving Calcium gluconate IV. [place], RN

## 2021-10-01 NOTE — Consult Note (Addendum)
Referring Provider: Dr. Karmen Bongo Primary Care Physician:  Colon Branch, MD Primary Gastroenterologist:  Dr. Hilarie Fredrickson   Reason for Consultation: Diarrhea  HPI: Richard Gates is a 61 y.o. male is a 61 year old male with a past medical history of hypertension, hyperlipidemia, coronary artery calcifications per CT, hepatic steatosis, GERD symptoms, B12 deficiency anemia, folate deficiency, LLE DVT 05/2021 (Eliquis was prescribed x 6 months, patient took for only 2 months), COPD, seizure like activity 06/2021, CKD and colon polyps.  He was seen in office by Dr. Larose Kells  09/29/2021 due to having decreased appetite, nausea and vomiting with diarrhea for the past 4 weeks.  Laboratory studies were done during this visit which identified significant electrolyte derangement with a potassium level of 2.4, magnesium 0.6 and anion gap of 19.  He was contacted with these results was advised to present to [facility] ED 09/30/2021 for further evaluation.  Labs in the ED showed a WBC count of 5.0.  Hemoglobin 11.6.  Hematocrit 32.7.  Platelet 279.  Sodium 137.  Potassium 2.3.  Chloride 92.  Glucose 107.  BUN 6.  Creatinine 0.93.  Calcium 6.2.  Albumin 3.1.  AST 48.  ALT 20.  Alk phos 135.  Total bili 1.0.  C. difficile antigen and C. difficile toxin negative.  GI pathogen panel pending.  He received KCl and magnesium IV.  A GI consult was requested for further evaluation and management regarding his diarrhea.  Laboratory studies today showed a Potassium level 2.5.  Calcium 6.4.  KCl and calcium IV ordered by the hospitalist.  Labs 09/29/2021: Vitamin B12 level 234.  Folate 2.8.  He complains of having a decreased appetite, food tastes like paper with vomiting partially digested food once to twice weekly for the past month.  No coffee-ground or frank hematemesis.  He goes 3 to 4 days without eating any solid food.  He also started having brown watery nonbloody loose stools 5-8 times daily over the past 4 weeks.   No nighttime episodes of diarrhea no antibiotic use within the past 3 to 4 months.  No abdominal pain.  He reported passing 4-5 small volume watery brown stools last night and 2 similar watery stools this morning.  No rectal bleeding or melena.  He has a history of chronic diarrhea.  He underwent a colonoscopy 10/01/2019 which identified 4 tubular adenomatous and one hyperplastic polyp which were removed from the rectum and colon.  No evidence of colitis.  No fever, sweats or chills.  No weight loss.  He took ASA 81 mg daily which was stopped a few months ago then he restarted taking it 2 days ago.  He drinks 12 hard lemonade/seltzers weekly.  History of GERD.  He reported having increased heartburn a few months ago for which she took Pantoprazole 40 mg once daily for about 2 weeks, he stopped taking it as his heartburn resolved.  No dysphagia.   I saw Mr. Tremaine as an outpatient 05/21/2021 for further evaluation regarding diarrhea x 4 months.  C. difficile was negative at that time.  He was on Pantoprazole for 4 to 6 months prior to that visit and I discontinued it as I was concerned his diarrhea may be PPI induced. At that time, he was noted to have elevated LFTs with alk phos 154.  AST 50.  A repeat hepatic panel in 3 to 4 weeks was ordered but was not completed.  His creatinine level was worsening and therefore he was referred to  nephrology at that point.  CTAP versus MRI/MRCP with contrast was considered if his renal status stabilized.  In review of his epic records, he was admitted to the hospital 05/30/2021 due to having a syncopal episode with seizure-like activity, likely due to electrolyte derangements with chronic diarrhea. Head CT was negative for intracranial abnormality.  EEG and MRI were unremarkable.  A left lower extremity venous duplex was positive for DVT and he was started on heparin and subsequently transition to Eliquis.  At the time of discharge on 06/02/2021, he was advised to take Eliquis 10  mg twice daily for 6 days followed by Eliquis 5 mg twice daily for 6 to 7 months.  However, he reported taking the Eliquis for 2 months then he stopped it because he did not think he needed to take it anymore.   RUQ sonogram 04/13/2021: Gallbladder: No gallstones or wall thickening visualized. No sonographic Murphy sign noted by sonographer.  Common bile duct:Diameter: 2 mm  Liver:There is diffuse increased liver echogenicity most commonly seen in the setting of fatty infiltration. Superimposed inflammation or fibrosis is not excluded. Clinical correlation is recommended. Portal vein is patent on color Doppler imaging with normal direction of blood flow towards the liver. IMPRESSION: Fatty liver, otherwise unremarkable right upper quadrant ultrasound.  Colonoscopy 10/01/2019:  -One 4 mm polyp in the cecum, removed with a cold snare. Resected and retrieved. - Two 4 to 5 mm polyps in the transverse colon, removed with a cold snare. Resected and retrieved. - One 5 mm polyp in the descending colon, removed with a cold snare. Resected and retrieved. - One 3 mm polyp in the distal rectum, removed with a cold snare. Resected and retrieved. - Diverticulosis from cecum to sigmoid colon. - Internal hemorrhoids. - 3 year recall 1. Surgical [P], colon, transverse and cecum, polyp (3) - TUBULAR ADENOMA (X3 FRAGMENTS). - NO HIGH GRADE DYSPLASIA OR MALIGNANCY. 2. Surgical [P], colon, descending and rectum, polyp (2) - TUBULAR ADENOMA. - HYPERPLASTIC POLYP. - NO HIGH GRADE DYSPLASIA OR MALIGNANCY  Past Medical History:  Diagnosis Date   COPD (chronic obstructive pulmonary disease) (HCC)    Coronary artery calcification seen on CAT scan 08/2019   Coronary calcium score 103; short LM with<25% mixed calcific plaque (minimal); aortic atherosclerosis with normal size.  No dissection.  No aortic valve calcification   GERD (gastroesophageal reflux disease)    Hyperlipidemia    Hypertension      Past Surgical History:  Procedure Laterality Date   APPENDECTOMY     COLONOSCOPY  08/24/2016   POLYPECTOMY     TRANSTHORACIC ECHOCARDIOGRAM  04/2018   EF 65-70% (vigorous). No RWMA.  Gr 1 DD.  Normal valves.  (In setting of COPD exacerbation)    Prior to Admission medications   Medication Sig Start Date End Date Taking? Authorizing Provider  albuterol (VENTOLIN HFA) 108 (90 Base) MCG/ACT inhaler INHALE 2 PUFFS INTO THE LUNGS EVERY 6 HOURS AS NEEDED FOR WHEEZING OR SHORTNESS OF BREATH Patient taking differently: 2 puffs every 6 (six) hours as needed for wheezing or shortness of breath. 04/13/21  Yes Paz, Alda Berthold, MD  fluticasone-salmeterol (ADVAIR) 250-50 MCG/ACT AEPB Inhale 1 puff into the lungs in the morning and at bedtime. 06/04/21  Yes Paz, Alda Berthold, MD  amLODipine (NORVASC) 10 MG tablet Take 1 tablet (10 mg total) by mouth daily. Patient not taking: Reported on 09/30/2021 05/12/21   Colon Branch, MD  atorvastatin (LIPITOR) 80 MG tablet Take 1 tablet (80 mg  total) by mouth daily. Patient not taking: Reported on 09/30/2021 04/13/21   Colon Branch, MD  famotidine (PEPCID) 20 MG tablet TAKE 1 TABLET(20 MG) BY MOUTH DAILY Patient not taking: Reported on 09/30/2021 08/01/21   Noralyn Pick, NP  metoprolol tartrate (LOPRESSOR) 100 MG tablet Take 1 tablet (100 mg total) by mouth 2 (two) times daily. Patient not taking: Reported on 09/30/2021 05/26/21   Colon Branch, MD  pantoprazole (PROTONIX) 40 MG tablet TAKE 1 TABLET(40 MG) BY MOUTH DAILY Patient not taking: Reported on 09/30/2021 07/01/21   Colon Branch, MD    Current Facility-Administered Medications  Medication Dose Route Frequency Provider Last Rate Last Admin   acetaminophen (TYLENOL) tablet 650 mg  650 mg Oral Q6H PRN Karmen Bongo, MD       Or   acetaminophen (TYLENOL) suppository 650 mg  650 mg Rectal Q6H PRN Karmen Bongo, MD       enoxaparin (LOVENOX) injection 40 mg  40 mg Subcutaneous Q24H Karmen Bongo, MD   40 mg at  09/30/21 1720   hydrALAZINE (APRESOLINE) injection 5 mg  5 mg Intravenous Q4H PRN Karmen Bongo, MD       lactated ringers infusion   Intravenous Continuous Karmen Bongo, MD 75 mL/hr at 10/01/21 1155 Infusion Verify at 10/01/21 2080   oxyCODONE (Oxy IR/ROXICODONE) immediate release tablet 5 mg  5 mg Oral Q4H PRN Karmen Bongo, MD       sodium chloride flush (NS) 0.9 % injection 3 mL  3 mL Intravenous Lillia Mountain, MD   3 mL at 09/30/21 2108    Allergies as of 09/30/2021   (No Known Allergies)    Family History  Problem Relation Age of Onset   Stroke Father        July 2233: complicated by hemmrhage -- lost eyesight, memory loss   Hypertension Other    Heart disease Paternal Uncle    Breast cancer Sister    Heart disease Paternal Aunt        surgery   Heart attack Maternal Uncle    Colon cancer Neg Hx    Prostate cancer Neg Hx    Colon polyps Neg Hx    Esophageal cancer Neg Hx    Stomach cancer Neg Hx    Rectal cancer Neg Hx     Social History   Socioeconomic History   Marital status: Married    Spouse name: Not on file   Number of children: 1   Years of education: Not on file   Highest education level: Not on file  Occupational History   Occupation: [employer]    Comment: Freight forwarder  Tobacco Use   Smoking status: Former    Packs/day: 0.75    Years: 41.00    Pack years: 30.75    Types: Cigarettes    Quit date: 2020    Years since quitting: 3.4   Smokeless tobacco: Never  Vaping Use   Vaping Use: Never used  Substance and Sexual Activity   Alcohol use: Yes    Comment: drinks [employer] Lemonade, 7 per week   Drug use: Not Currently    Types: Marijuana    Comment: last use in maybe 2018   Sexual activity: Not on file  Other Topics Concern   Not on file  Social History Narrative   Household- pt and wife   1 daughter    40 g-children   2 g-g child   Social Determinants of Health  Financial Resource Strain: Medium Risk   Difficulty of  Paying Living Expenses: Somewhat hard  Food Insecurity: Not on file  Transportation Needs: No Transportation Needs   Lack of Transportation (Medical): No   Lack of Transportation (Non-Medical): No  Physical Activity: Not on file  Stress: Not on file  Social Connections: Not on file  Intimate Partner Violence: Not on file    Review of Systems: Gen: Denies fever, sweats or chills. No weight loss.  CV: Denies chest pain, palpitations or edema. Resp: Denies cough, shortness of breath of hemoptysis.  GI: See HPI GU : Denies urinary burning, blood in urine, increased urinary frequency or incontinence. MS: Denies joint pain, muscles aches or weakness. Derm: Denies rash, itchiness, skin lesions or unhealing ulcers. Psych: Denies depression, anxiety or memory loss. Heme: Denies easy bruising, bleeding. Neuro:  Denies headaches, dizziness or paresthesias. Endo:  Denies any problems with DM, thyroid or adrenal function.  Physical Exam: Vital signs in last 24 hours: Temp:  [97.9 F (36.6 C)-99.3 F (37.4 C)] 98.1 F (36.7 C) (06/02 0331) Pulse Rate:  [83-117] 89 (06/02 0331) Resp:  [10-20] 18 (06/02 0331) BP: (122-165)/(70-98) 122/78 (06/02 0331) SpO2:  [93 %-100 %] 96 % (06/02 0331) Weight:  [66.2 kg-69.9 kg] 66.2 kg (06/01 1736) Last BM Date : 10/01/21 General:  Alert 61 year old male fatigued appearing in no acute distress. Head:  Normocephalic and atraumatic. Eyes:  No scleral icterus. Conjunctiva pink. Ears:  Normal auditory acuity. Nose:  No deformity, discharge or lesions. Mouth:  Dentition intact. No ulcers or lesions.  Neck:  Supple. No lymphadenopathy or thyromegaly.  Lungs: Sounds clear throughout. Heart: Regular rate and rhythm, no murmurs Abdomen: Mildly distended, nontender, no palpable mass.  Positive bowel sounds all 4 quadrants. Rectal: Deferred. Musculoskeletal:  Symmetrical without gross deformities.  Pulses:  Normal pulses noted. Extremities:  Without clubbing  or edema. Neurologic:  Alert and  oriented x 4. No focal deficits.  Skin:  Intact without significant lesions or rashes. Psych:  Alert and cooperative. Normal mood and affect.  Intake/Output from previous day: 06/01 0701 - 06/02 0700 In: 1086.8 [I.V.:937.8; IV Piggyback:148.9] Out: 1100 [Stool:1100] Intake/Output this shift: No intake/output data recorded.  Lab Results: Recent Labs    09/30/21 1408 10/01/21 0403  WBC 5.0 6.1  HGB 11.6* 10.2*  HCT 32.7* 29.9*  PLT 279 245   BMET Recent Labs    09/29/21 1614 09/30/21 1408 10/01/21 0403  NA 140 137 136  K 2.4* 2.3* 2.5*  CL 91* 92* 98  CO2 32 31 30  GLUCOSE 114* 107* 108*  BUN 9 6* <5*  CREATININE 0.94 0.93 0.78  CALCIUM 6.4* 6.2* 6.4*   LFT Recent Labs    09/30/21 1408  PROT 6.1*  ALBUMIN 3.1*  AST 48*  ALT 20  ALKPHOS 135*  BILITOT 1.0   PT/INR No results for input(s): LABPROT, INR in the last 72 hours. Hepatitis Panel No results for input(s): HEPBSAG, HCVAB, HEPAIGM, HEPBIGM in the last 72 hours.   Studies/Results: No results found.  IMPRESSION/PLAN:  76) 61 year old male with N/V, decreased appetite x 4 weeks with significant electrolyte derangement.  Potassium 2.5.  Calcium 6.4.  Magnesium 0.6 -> 1.7. -Potassium, calcium and magnesium IV per the hospitalist -Ondansetron 4 mg p.o. or IV every 6 hours as needed -EGD after electrolytes normalize -Recommend CTAP (to rule out intra-abdominal/pelvic pathology/malignancy) with oral and reduced IV contrast, to discuss further with Dr. Hilarie Fredrickson -Clear liquid diet -Continue IV fluids  2)  Recurrent, chronic diarrhea.  C. difficile negative. -Await GI pathogen panel results -Avoid PPI -Clear liquid diet for now -Eventual diagnostic colonoscopy to rule out colitis   3) Anemia. History of vitamin B12 deficiency, on B12 injections.  He is folate deficient.  No overt GI bleeding. Hg 11.6 (Hg 11.2 on 06/01/2021) -> Hg today 10.2.  -Iron, TIBC and ferritin  levels -Folate replacement, defer to the hospitalist  4) Elevated LFTs since 04/2021. Alk phos 135. AST 48. RUQ sonogram 04/13/2021 showed a fatty liver, normal gallbladder.  Past elevated ferritin with negative  HFE mutations.  -Hepatitis A, B and C serologies, ANA, SMA, AMA, ceruloplasmin level, IgG, alpha-1 antitrypsin level and GGT -CTAP vs MRI/MRCP ?  5) GERD -Famotidine 20 mg p.o. daily -Eventual EGD  6) CKD.  Creatinine 0.93.  GFR > 60.  7) LLE DVT 05/30/2021, patient was prescribed Eliquis for 6 - 7 months but he reported taking it for only 2 months. -Recommend a repeat LLE Doppler study to assess for residual LLE DVT  8) History of tubular adenomatous/hyperplastic polyps removed from the colon and rectum per colonoscopy 10/2019 -Recall colonoscopy due 10/2022  Await further recommendations per Dr. Maurine Minister Dorathy Daft  10/01/2021, 09:00AM

## 2021-10-01 NOTE — H&P (View-Only) (Signed)
 Referring Provider: Dr. Jennifer Yates Primary Care Physician:  Paz, Jose E, MD Primary Gastroenterologist:  Dr. Pyrtle   Reason for Consultation: Diarrhea  HPI: Richard Gates is a 61 y.o. male is a 60-year-old male with a past medical history of hypertension, hyperlipidemia, coronary artery calcifications per CT, hepatic steatosis, GERD symptoms, B12 deficiency anemia, folate deficiency, LLE DVT 05/2021 (Eliquis was prescribed x 6 months, patient took for only 2 months), COPD, seizure like activity 06/2021, CKD and colon polyps.  He was seen in office by Dr. Paz  09/29/2021 due to having decreased appetite, nausea and vomiting with diarrhea for the past 4 weeks.  Laboratory studies were done during this visit which identified significant electrolyte derangement with a potassium level of 2.4, magnesium 0.6 and anion gap of 19.  He was contacted with these results was advised to present to [facility] ED 09/30/2021 for further evaluation.  Labs in the ED showed a WBC count of 5.0.  Hemoglobin 11.6.  Hematocrit 32.7.  Platelet 279.  Sodium 137.  Potassium 2.3.  Chloride 92.  Glucose 107.  BUN 6.  Creatinine 0.93.  Calcium 6.2.  Albumin 3.1.  AST 48.  ALT 20.  Alk phos 135.  Total bili 1.0.  C. difficile antigen and C. difficile toxin negative.  GI pathogen panel pending.  He received KCl and magnesium IV.  A GI consult was requested for further evaluation and management regarding his diarrhea.  Laboratory studies today showed a Potassium level 2.5.  Calcium 6.4.  KCl and calcium IV ordered by the hospitalist.  Labs 09/29/2021: Vitamin B12 level 234.  Folate 2.8.  He complains of having a decreased appetite, food tastes like paper with vomiting partially digested food once to twice weekly for the past month.  No coffee-ground or frank hematemesis.  He goes 3 to 4 days without eating any solid food.  He also started having brown watery nonbloody loose stools 5-8 times daily over the past 4 weeks.   No nighttime episodes of diarrhea no antibiotic use within the past 3 to 4 months.  No abdominal pain.  He reported passing 4-5 small volume watery brown stools last night and 2 similar watery stools this morning.  No rectal bleeding or melena.  He has a history of chronic diarrhea.  He underwent a colonoscopy 10/01/2019 which identified 4 tubular adenomatous and one hyperplastic polyp which were removed from the rectum and colon.  No evidence of colitis.  No fever, sweats or chills.  No weight loss.  He took ASA 81 mg daily which was stopped a few months ago then he restarted taking it 2 days ago.  He drinks 12 hard lemonade/seltzers weekly.  History of GERD.  He reported having increased heartburn a few months ago for which she took Pantoprazole 40 mg once daily for about 2 weeks, he stopped taking it as his heartburn resolved.  No dysphagia.   I saw Mr. Glomski as an outpatient 05/21/2021 for further evaluation regarding diarrhea x 4 months.  C. difficile was negative at that time.  He was on Pantoprazole for 4 to 6 months prior to that visit and I discontinued it as I was concerned his diarrhea may be PPI induced. At that time, he was noted to have elevated LFTs with alk phos 154.  AST 50.  A repeat hepatic panel in 3 to 4 weeks was ordered but was not completed.  His creatinine level was worsening and therefore he was referred to   nephrology at that point.  CTAP versus MRI/MRCP with contrast was considered if his renal status stabilized.  In review of his epic records, he was admitted to the hospital 05/30/2021 due to having a syncopal episode with seizure-like activity, likely due to electrolyte derangements with chronic diarrhea. Head CT was negative for intracranial abnormality.  EEG and MRI were unremarkable.  A left lower extremity venous duplex was positive for DVT and he was started on heparin and subsequently transition to Eliquis.  At the time of discharge on 06/02/2021, he was advised to take Eliquis 10  mg twice daily for 6 days followed by Eliquis 5 mg twice daily for 6 to 7 months.  However, he reported taking the Eliquis for 2 months then he stopped it because he did not think he needed to take it anymore.   RUQ sonogram 04/13/2021: Gallbladder: No gallstones or wall thickening visualized. No sonographic Murphy sign noted by sonographer.  Common bile duct:Diameter: 2 mm  Liver:There is diffuse increased liver echogenicity most commonly seen in the setting of fatty infiltration. Superimposed inflammation or fibrosis is not excluded. Clinical correlation is recommended. Portal vein is patent on color Doppler imaging with normal direction of blood flow towards the liver. IMPRESSION: Fatty liver, otherwise unremarkable right upper quadrant ultrasound.  Colonoscopy 10/01/2019:  -One 4 mm polyp in the cecum, removed with a cold snare. Resected and retrieved. - Two 4 to 5 mm polyps in the transverse colon, removed with a cold snare. Resected and retrieved. - One 5 mm polyp in the descending colon, removed with a cold snare. Resected and retrieved. - One 3 mm polyp in the distal rectum, removed with a cold snare. Resected and retrieved. - Diverticulosis from cecum to sigmoid colon. - Internal hemorrhoids. - 3 year recall 1. Surgical [P], colon, transverse and cecum, polyp (3) - TUBULAR ADENOMA (X3 FRAGMENTS). - NO HIGH GRADE DYSPLASIA OR MALIGNANCY. 2. Surgical [P], colon, descending and rectum, polyp (2) - TUBULAR ADENOMA. - HYPERPLASTIC POLYP. - NO HIGH GRADE DYSPLASIA OR MALIGNANCY  Past Medical History:  Diagnosis Date   COPD (chronic obstructive pulmonary disease) (HCC)    Coronary artery calcification seen on CAT scan 08/2019   Coronary calcium score 103; short LM with<25% mixed calcific plaque (minimal); aortic atherosclerosis with normal size.  No dissection.  No aortic valve calcification   GERD (gastroesophageal reflux disease)    Hyperlipidemia    Hypertension      Past Surgical History:  Procedure Laterality Date   APPENDECTOMY     COLONOSCOPY  08/24/2016   POLYPECTOMY     TRANSTHORACIC ECHOCARDIOGRAM  04/2018   EF 65-70% (vigorous). No RWMA.  Gr 1 DD.  Normal valves.  (In setting of COPD exacerbation)    Prior to Admission medications   Medication Sig Start Date End Date Taking? Authorizing Provider  albuterol (VENTOLIN HFA) 108 (90 Base) MCG/ACT inhaler INHALE 2 PUFFS INTO THE LUNGS EVERY 6 HOURS AS NEEDED FOR WHEEZING OR SHORTNESS OF BREATH Patient taking differently: 2 puffs every 6 (six) hours as needed for wheezing or shortness of breath. 04/13/21  Yes Paz, Alda Berthold, MD  fluticasone-salmeterol (ADVAIR) 250-50 MCG/ACT AEPB Inhale 1 puff into the lungs in the morning and at bedtime. 06/04/21  Yes Paz, Alda Berthold, MD  amLODipine (NORVASC) 10 MG tablet Take 1 tablet (10 mg total) by mouth daily. Patient not taking: Reported on 09/30/2021 05/12/21   Colon Branch, MD  atorvastatin (LIPITOR) 80 MG tablet Take 1 tablet (80 mg  total) by mouth daily. Patient not taking: Reported on 09/30/2021 04/13/21   Paz, Jose E, MD  famotidine (PEPCID) 20 MG tablet TAKE 1 TABLET(20 MG) BY MOUTH DAILY Patient not taking: Reported on 09/30/2021 08/01/21   Kennedy-Smith, Jaylen Claude M, NP  metoprolol tartrate (LOPRESSOR) 100 MG tablet Take 1 tablet (100 mg total) by mouth 2 (two) times daily. Patient not taking: Reported on 09/30/2021 05/26/21   Paz, Jose E, MD  pantoprazole (PROTONIX) 40 MG tablet TAKE 1 TABLET(40 MG) BY MOUTH DAILY Patient not taking: Reported on 09/30/2021 07/01/21   Paz, Jose E, MD    Current Facility-Administered Medications  Medication Dose Route Frequency Provider Last Rate Last Admin   acetaminophen (TYLENOL) tablet 650 mg  650 mg Oral Q6H PRN Yates, Jennifer, MD       Or   acetaminophen (TYLENOL) suppository 650 mg  650 mg Rectal Q6H PRN Yates, Jennifer, MD       enoxaparin (LOVENOX) injection 40 mg  40 mg Subcutaneous Q24H Yates, Jennifer, MD   40 mg at  09/30/21 1720   hydrALAZINE (APRESOLINE) injection 5 mg  5 mg Intravenous Q4H PRN Yates, Jennifer, MD       lactated ringers infusion   Intravenous Continuous Yates, Jennifer, MD 75 mL/hr at 10/01/21 0333 Infusion Verify at 10/01/21 0333   oxyCODONE (Oxy IR/ROXICODONE) immediate release tablet 5 mg  5 mg Oral Q4H PRN Yates, Jennifer, MD       sodium chloride flush (NS) 0.9 % injection 3 mL  3 mL Intravenous Q12H Yates, Jennifer, MD   3 mL at 09/30/21 2108    Allergies as of 09/30/2021   (No Known Allergies)    Family History  Problem Relation Age of Onset   Stroke Father        July 2019: complicated by hemmrhage -- lost eyesight, memory loss   Hypertension Other    Heart disease Paternal Uncle    Breast cancer Sister    Heart disease Paternal Aunt        surgery   Heart attack Maternal Uncle    Colon cancer Neg Hx    Prostate cancer Neg Hx    Colon polyps Neg Hx    Esophageal cancer Neg Hx    Stomach cancer Neg Hx    Rectal cancer Neg Hx     Social History   Socioeconomic History   Marital status: Married    Spouse name: Not on file   Number of children: 1   Years of education: Not on file   Highest education level: Not on file  Occupational History   Occupation: [employer]    Comment: forklift operator  Tobacco Use   Smoking status: Former    Packs/day: 0.75    Years: 41.00    Pack years: 30.75    Types: Cigarettes    Quit date: 2020    Years since quitting: 3.4   Smokeless tobacco: Never  Vaping Use   Vaping Use: Never used  Substance and Sexual Activity   Alcohol use: Yes    Comment: drinks [employer] Lemonade, 7 per week   Drug use: Not Currently    Types: Marijuana    Comment: last use in maybe 2018   Sexual activity: Not on file  Other Topics Concern   Not on file  Social History Narrative   Household- pt and wife   1 daughter    4 g-children   2 g-g child   Social Determinants of Health     Financial Resource Strain: Medium Risk   Difficulty of  Paying Living Expenses: Somewhat hard  Food Insecurity: Not on file  Transportation Needs: No Transportation Needs   Lack of Transportation (Medical): No   Lack of Transportation (Non-Medical): No  Physical Activity: Not on file  Stress: Not on file  Social Connections: Not on file  Intimate Partner Violence: Not on file    Review of Systems: Gen: Denies fever, sweats or chills. No weight loss.  CV: Denies chest pain, palpitations or edema. Resp: Denies cough, shortness of breath of hemoptysis.  GI: See HPI GU : Denies urinary burning, blood in urine, increased urinary frequency or incontinence. MS: Denies joint pain, muscles aches or weakness. Derm: Denies rash, itchiness, skin lesions or unhealing ulcers. Psych: Denies depression, anxiety or memory loss. Heme: Denies easy bruising, bleeding. Neuro:  Denies headaches, dizziness or paresthesias. Endo:  Denies any problems with DM, thyroid or adrenal function.  Physical Exam: Vital signs in last 24 hours: Temp:  [97.9 F (36.6 C)-99.3 F (37.4 C)] 98.1 F (36.7 C) (06/02 0331) Pulse Rate:  [83-117] 89 (06/02 0331) Resp:  [10-20] 18 (06/02 0331) BP: (122-165)/(70-98) 122/78 (06/02 0331) SpO2:  [93 %-100 %] 96 % (06/02 0331) Weight:  [66.2 kg-69.9 kg] 66.2 kg (06/01 1736) Last BM Date : 10/01/21 General:  Alert 61 year old male fatigued appearing in no acute distress. Head:  Normocephalic and atraumatic. Eyes:  No scleral icterus. Conjunctiva pink. Ears:  Normal auditory acuity. Nose:  No deformity, discharge or lesions. Mouth:  Dentition intact. No ulcers or lesions.  Neck:  Supple. No lymphadenopathy or thyromegaly.  Lungs: Sounds clear throughout. Heart: Regular rate and rhythm, no murmurs Abdomen: Mildly distended, nontender, no palpable mass.  Positive bowel sounds all 4 quadrants. Rectal: Deferred. Musculoskeletal:  Symmetrical without gross deformities.  Pulses:  Normal pulses noted. Extremities:  Without clubbing  or edema. Neurologic:  Alert and  oriented x 4. No focal deficits.  Skin:  Intact without significant lesions or rashes. Psych:  Alert and cooperative. Normal mood and affect.  Intake/Output from previous day: 06/01 0701 - 06/02 0700 In: 1086.8 [I.V.:937.8; IV Piggyback:148.9] Out: 1100 [Stool:1100] Intake/Output this shift: No intake/output data recorded.  Lab Results: Recent Labs    09/30/21 1408 10/01/21 0403  WBC 5.0 6.1  HGB 11.6* 10.2*  HCT 32.7* 29.9*  PLT 279 245   BMET Recent Labs    09/29/21 1614 09/30/21 1408 10/01/21 0403  NA 140 137 136  K 2.4* 2.3* 2.5*  CL 91* 92* 98  CO2 32 31 30  GLUCOSE 114* 107* 108*  BUN 9 6* <5*  CREATININE 0.94 0.93 0.78  CALCIUM 6.4* 6.2* 6.4*   LFT Recent Labs    09/30/21 1408  PROT 6.1*  ALBUMIN 3.1*  AST 48*  ALT 20  ALKPHOS 135*  BILITOT 1.0   PT/INR No results for input(s): LABPROT, INR in the last 72 hours. Hepatitis Panel No results for input(s): HEPBSAG, HCVAB, HEPAIGM, HEPBIGM in the last 72 hours.   Studies/Results: No results found.  IMPRESSION/PLAN:  65) 61 year old male with N/V, decreased appetite x 4 weeks with significant electrolyte derangement.  Potassium 2.5.  Calcium 6.4.  Magnesium 0.6 -> 1.7. -Potassium, calcium and magnesium IV per the hospitalist -Ondansetron 4 mg p.o. or IV every 6 hours as needed -EGD after electrolytes normalize -Recommend CTAP (to rule out intra-abdominal/pelvic pathology/malignancy) with oral and reduced IV contrast, to discuss further with Dr. Hilarie Fredrickson -Clear liquid diet -Continue IV fluids  2)  Recurrent, chronic diarrhea.  C. difficile negative. -Await GI pathogen panel results -Avoid PPI -Clear liquid diet for now -Eventual diagnostic colonoscopy to rule out colitis   3) Anemia. History of vitamin B12 deficiency, on B12 injections.  He is folate deficient.  No overt GI bleeding. Hg 11.6 (Hg 11.2 on 06/01/2021) -> Hg today 10.2.  -Iron, TIBC and ferritin  levels -Folate replacement, defer to the hospitalist  4) Elevated LFTs since 04/2021. Alk phos 135. AST 48. RUQ sonogram 04/13/2021 showed a fatty liver, normal gallbladder.  Past elevated ferritin with negative  HFE mutations.  -Hepatitis A, B and C serologies, ANA, SMA, AMA, ceruloplasmin level, IgG, alpha-1 antitrypsin level and GGT -CTAP vs MRI/MRCP ?  5) GERD -Famotidine 20 mg p.o. daily -Eventual EGD  6) CKD.  Creatinine 0.93.  GFR > 60.  7) LLE DVT 05/30/2021, patient was prescribed Eliquis for 6 - 7 months but he reported taking it for only 2 months. -Recommend a repeat LLE Doppler study to assess for residual LLE DVT  8) History of tubular adenomatous/hyperplastic polyps removed from the colon and rectum per colonoscopy 10/2019 -Recall colonoscopy due 10/2022  Await further recommendations per Dr. Maurine Minister Dorathy Daft  10/01/2021, 09:00AM

## 2021-10-01 NOTE — Progress Notes (Signed)
PROGRESS NOTE  Richard Gates  DOB: January 05, 1961  PCP: Colon Branch, MD GDJ:[id number]  DOA: 09/30/2021  LOS: 0 days  Hospital Day: 2  Brief narrative: Richard Gates is a 61 y.o. male with PMH significant for HTN, HLD, CAD, COPD, GERD, colonic polyps and colonoscopy done 2 years ago. In January 2023, he was seen by GI for 3 to 4 months history of diarrhea.  It got better but restarted again few weeks ago. 5/31, patient was seen by PCP for poor appetite, lack of taste, diarrhea 5-8 times a day ongoing for about a month. PE she had a blood work done which resulted on 6/1 showing multiple electrolyte abnormalities and hence he was sent to the ED.  In the ED, patient was afebrile, heart rate 117, blood pressure 140/98, breathing on room air Labs in the ED showed potassium low at 2.3, calcium low at 6.2, magnesium low at 0.7 albumin 3.1, folic acid low at 2.8, vitamin B12 low at 234, WBC count normal, hemoglobin 11.6 EKG showed QTc prolongation Patient was given electrolyte replacements Kept in observation to hospitalist service  Subjective: Patient was seen and examined this morning.  Pleasant middle-aged African-American male.  Not in distress.  Continues to have loose watery bowel movements. Chart reviewed Labs this morning with potassium low at 2.5, calcium is low at 6.4, magnesium better at 1.7.  Principal Problem:   Electrolyte disturbance Active Problems:   Hyperlipidemia with target LDL less than 70   COPD mixed type (HCC)   Hypertension   Diarrhea   Prolonged QT interval    Assessment and Plan: Hypokalemia/hypomagnesemia -Low potassium and magnesium level.  Ongoing oral and IV replacement.  Repeat labs tomorrow. -Phosphorus level low as well at 1.5.  Oral phosphorus replacement ordered. -Repeat labs tomorrow. Recent Labs  Lab 09/29/21 1614 09/30/21 1408 10/01/21 0403  K 2.4* 2.3* 2.5*  MG 0.6* 0.7* 1.7  PHOS  --   --  1.5*   Hypocalcemia -Calcium level low in  the range of 6. -IV calcium gluconate for replacement.  Recheck tomorrow Recent Labs  Lab 09/29/21 1614 09/30/21 1408 10/01/21 0403  CALCIUM 6.4* 6.2* 6.4*    Chronic diarrhea -Ongoing at least for last 8 months.  Last seen by GI in January.  Unclear what treatment was given but diarrhea improved only to recur later.   -Previous colonoscopy 2 years ago per report showed multiple polyps.  -Currently on clear liquid diet -GI consulted.  GI pathogen panel sent.  COPD -Continue bronchodilators   HTN -Blood pressure in normal range.  Was previously taking amlodipine, metoprolol but patient stopped taking -Continue monitor   HLD -Previously taking Lipitor and also stopped taking this -PCP f/u encouraged   Goals of care   Code Status: Full Code    Mobility: Encourage ambulation Skin assessment:     Nutritional status:  Body mass index is 22.19 kg/m.          Diet:  Diet Order             Diet clear liquid Room service appropriate? Yes; Fluid consistency: Thin  Diet effective now                   DVT prophylaxis:  enoxaparin (LOVENOX) injection 40 mg Start: 09/30/21 1645   Antimicrobials: None Fluid: LR at 75 mill per hour Consultants: GI Family Communication: None at bedside  Status is: Inpatient  Continue in-hospital care because: Needs IV fluid, IV and  oral electrolyte replacement, GI work-up Level of care: Progressive   Dispo: The patient is from: Home              Anticipated d/c is to: Hopefully back to home in 2 to 3 days              Patient currently is not medically stable to d/c.   Difficult to place patient No     Infusions:   lactated ringers 75 mL/hr at 10/01/21 1007    Scheduled Meds:  enoxaparin (LOVENOX) injection  40 mg Subcutaneous Q24H   phosphorus  500 mg Oral Once   sodium chloride flush  3 mL Intravenous Q12H    PRN meds: acetaminophen **OR** acetaminophen, hydrALAZINE, oxyCODONE    Antimicrobials: Anti-infectives (From admission, onward)    None       Objective: Vitals:   10/01/21 1210 10/01/21 1545  BP: (!) 155/80 (!) 158/97  Pulse: 88 83  Resp: 16 16  Temp: 98.2 F (36.8 C) 98.2 F (36.8 C)  SpO2: 100% 100%    Intake/Output Summary (Last 24 hours) at 10/01/2021 1546 Last data filed at 10/01/2021 1544 Gross per 24 hour  Intake 1326.75 ml  Output 2550 ml  Net -1223.25 ml   Filed Weights   09/30/21 1359 09/30/21 1736  Weight: 69.9 kg 66.2 kg   Weight change:  Body mass index is 22.19 kg/m.   Physical Exam: General exam: Pleasant, middle-aged African-American male.  Not in physical distress Skin: No rashes, lesions or ulcers. HEENT: Atraumatic, normocephalic, no obvious bleeding Lungs: Clear to auscultation bilaterally CVS: Regular rate and rhythm, no murmur GI/Abd soft, nontender, nondistended, bowel sound present CNS: Alert, awake, oriented x3 Psychiatry: Mood appropriate Extremities: No pedal edema, no calf tenderness  Data Review: I have personally reviewed the laboratory data and studies available.  F/u labs ordered Unresulted Labs (From admission, onward)     Start     Ordered   10/02/21 1740  Basic metabolic panel  Daily,   R      10/01/21 0844   10/02/21 0500  CBC with Differential/Platelet  Daily,   R      10/01/21 0844   10/02/21 0500  Magnesium  Tomorrow morning,   R        10/01/21 0844   10/02/21 0500  Phosphorus  Tomorrow morning,   R        10/01/21 1542   10/01/21 0922  Hepatitis B surface antibody,quantitative  Once,   R        10/01/21 0922   10/01/21 0921  Ceruloplasmin  Once,   R        10/01/21 0922   10/01/21 0921  Alpha-1-antitrypsin  Once,   R        10/01/21 0922   10/01/21 0920  IgG  Once,   R        10/01/21 0922   10/01/21 0920  Anti-smooth muscle antibody, IgG  Once,   R        10/01/21 0922   10/01/21 0920  Mitochondrial antibodies  Once,   R        10/01/21 0922   10/01/21 0920  ANA  Once,   R         10/01/21 0922   10/01/21 0919  5 HIAA, quantitative, urine, 24 hour  Once,   R        10/01/21 0922   10/01/21 0919  Chromogranin A  Once,  R        10/01/21 0922   10/01/21 0919  Gastrin  Once,   R        10/01/21 0922   10/01/21 0919  Calprotectin, Fecal  Once,   R        10/01/21 8676            Signed, Terrilee Croak, MD [facility] 10/01/2021

## 2021-10-02 ENCOUNTER — Inpatient Hospital Stay (HOSPITAL_COMMUNITY): Payer: No Typology Code available for payment source | Admitting: Certified Registered Nurse Anesthetist

## 2021-10-02 ENCOUNTER — Encounter (HOSPITAL_COMMUNITY)
Admission: EM | Disposition: A | Discharge status: Home / Self Care | Payer: Self-pay | Source: Home / Self Care | Attending: Internal Medicine

## 2021-10-02 ENCOUNTER — Encounter (HOSPITAL_COMMUNITY): Payer: Self-pay | Admitting: Internal Medicine

## 2021-10-02 DIAGNOSIS — K259 Gastric ulcer, unspecified as acute or chronic, without hemorrhage or perforation: Secondary | ICD-10-CM

## 2021-10-02 DIAGNOSIS — K2289 Other specified disease of esophagus: Secondary | ICD-10-CM

## 2021-10-02 DIAGNOSIS — K2970 Gastritis, unspecified, without bleeding: Principal | ICD-10-CM

## 2021-10-02 DIAGNOSIS — K449 Diaphragmatic hernia without obstruction or gangrene: Secondary | ICD-10-CM

## 2021-10-02 DIAGNOSIS — K6389 Other specified diseases of intestine: Secondary | ICD-10-CM

## 2021-10-02 DIAGNOSIS — K2980 Duodenitis without bleeding: Secondary | ICD-10-CM

## 2021-10-02 DIAGNOSIS — K2100 Gastro-esophageal reflux disease with esophagitis, without bleeding: Secondary | ICD-10-CM

## 2021-10-02 HISTORY — PX: COLONOSCOPY WITH PROPOFOL: SHX5780

## 2021-10-02 HISTORY — PX: ESOPHAGOGASTRODUODENOSCOPY (EGD) WITH PROPOFOL: SHX5813

## 2021-10-02 HISTORY — PX: BIOPSY: SHX5522

## 2021-10-02 LAB — CBC WITH DIFFERENTIAL/PLATELET
Abs Immature Granulocytes: 0.02 10*3/uL (ref 0.00–0.07)
Basophils Absolute: 0 10*3/uL (ref 0.0–0.1)
Basophils Relative: 1 %
Eosinophils Absolute: 0.4 10*3/uL (ref 0.0–0.5)
Eosinophils Relative: 7 %
HCT: 30.9 % — ABNORMAL LOW (ref 39.0–52.0)
Hemoglobin: 11 g/dL — ABNORMAL LOW (ref 13.0–17.0)
Immature Granulocytes: 0 %
Lymphocytes Relative: 21 %
Lymphs Abs: 1.3 10*3/uL (ref 0.7–4.0)
MCH: 34.6 pg — ABNORMAL HIGH (ref 26.0–34.0)
MCHC: 35.6 g/dL (ref 30.0–36.0)
MCV: 97.2 fL (ref 80.0–100.0)
Monocytes Absolute: 0.4 10*3/uL (ref 0.1–1.0)
Monocytes Relative: 7 %
Neutro Abs: 3.8 10*3/uL (ref 1.7–7.7)
Neutrophils Relative %: 64 %
Platelets: 246 10*3/uL (ref 150–400)
RBC: 3.18 MIL/uL — ABNORMAL LOW (ref 4.22–5.81)
RDW: 13.7 % (ref 11.5–15.5)
WBC: 6 10*3/uL (ref 4.0–10.5)
nRBC: 0 % (ref 0.0–0.2)

## 2021-10-02 LAB — BASIC METABOLIC PANEL
Anion gap: 10 (ref 5–15)
BUN: <5 mg/dL — ABNORMAL LOW (ref 8–23)
CO2: 23 mmol/L (ref 22–32)
Calcium: 7.2 mg/dL — ABNORMAL LOW (ref 8.9–10.3)
Chloride: 104 mmol/L (ref 98–111)
Creatinine, Ser: 0.73 mg/dL (ref 0.61–1.24)
GFR, Estimated: >60 mL/min (ref >60)
Glucose, Bld: 97 mg/dL (ref 70–99)
Potassium: 2.8 mmol/L — ABNORMAL LOW (ref 3.5–5.1)
Sodium: 137 mmol/L (ref 135–145)

## 2021-10-02 LAB — PHOSPHORUS: Phosphorus: 1.9 mg/dL — ABNORMAL LOW (ref 2.5–4.6)

## 2021-10-02 LAB — VITAMIN D 25 HYDROXY (VIT D DEFICIENCY, FRACTURES): Vit D, 25-Hydroxy: 12.99 ng/mL — ABNORMAL LOW (ref 30–100)

## 2021-10-02 LAB — IGG: IgG (Immunoglobin G), Serum: 990 mg/dL (ref 603–1613)

## 2021-10-02 LAB — ANA: Anti Nuclear Antibody (ANA): NEGATIVE

## 2021-10-02 LAB — CERULOPLASMIN: Ceruloplasmin: 24.5 mg/dL (ref 16.0–31.0)

## 2021-10-02 LAB — HEPATITIS B SURFACE ANTIBODY, QUANTITATIVE: Hep B S AB Quant (Post): <3.1 m[IU]/mL — ABNORMAL LOW (ref >9.9)

## 2021-10-02 LAB — ALPHA-1-ANTITRYPSIN: A-1 Antitrypsin, Ser: 116 mg/dL (ref 101–187)

## 2021-10-02 LAB — MAGNESIUM: Magnesium: 1.5 mg/dL — ABNORMAL LOW (ref 1.7–2.4)

## 2021-10-02 SURGERY — ESOPHAGOGASTRODUODENOSCOPY (EGD) WITH PROPOFOL
Anesthesia: Monitor Anesthesia Care

## 2021-10-02 MED ORDER — ENSURE ENLIVE PO LIQD: 237.0000 mL | Freq: Three times a day (TID) | ORAL | Status: DC

## 2021-10-02 MED ORDER — VITAMIN D 25 MCG (1000 UNIT) PO TABS
3000.0000 [IU] | ORAL_TABLET | Freq: Every day | ORAL | Status: DC
  Administered 2021-10-03: 3000 [IU] via ORAL
  Filled 2021-10-02: qty 3

## 2021-10-02 MED ORDER — PANTOPRAZOLE SODIUM 40 MG PO TBEC
40.0000 mg | DELAYED_RELEASE_TABLET | Freq: Two times a day (BID) | ORAL | Status: DC
  Administered 2021-10-02 – 2021-10-03 (×3): 40 mg via ORAL
  Filled 2021-10-02 (×3): qty 1

## 2021-10-02 MED ORDER — DIPHENOXYLATE-ATROPINE 2.5-0.025 MG PO TABS: 1.0000 | ORAL_TABLET | Freq: Four times a day (QID) | ORAL | Status: DC | PRN

## 2021-10-02 MED ORDER — LACTATED RINGERS IV SOLN: INTRAVENOUS | Status: DC | PRN

## 2021-10-02 MED ORDER — PROPOFOL 500 MG/50ML IV EMUL
INTRAVENOUS | Status: DC | PRN
  Administered 2021-10-02: 125 ug/kg/min via INTRAVENOUS

## 2021-10-02 MED ORDER — SODIUM CHLORIDE 0.9 % IV SOLN: INTRAVENOUS | Status: DC

## 2021-10-02 MED ORDER — MAGNESIUM SULFATE 2 GM/50ML IV SOLN
2.0000 g | Freq: Once | INTRAVENOUS | Status: AC
  Administered 2021-10-02: 2 g via INTRAVENOUS
  Filled 2021-10-02: qty 50

## 2021-10-02 MED ORDER — K PHOS MONO-SOD PHOS DI & MONO 155-852-130 MG PO TABS
500.0000 mg | ORAL_TABLET | Freq: Once | ORAL | Status: AC
Start: 2021-10-02 — End: 2021-10-02
  Administered 2021-10-02: 500 mg via ORAL
  Filled 2021-10-02: qty 2

## 2021-10-02 MED ORDER — PROPOFOL 10 MG/ML IV BOLUS
INTRAVENOUS | Status: DC | PRN
  Administered 2021-10-02 (×3): 20 mg via INTRAVENOUS
  Administered 2021-10-02: 10 mg via INTRAVENOUS
  Administered 2021-10-02: 30 mg via INTRAVENOUS
  Administered 2021-10-02: 20 mg via INTRAVENOUS

## 2021-10-02 MED ORDER — POTASSIUM CHLORIDE 10 MEQ/100ML IV SOLN
10.0000 meq | INTRAVENOUS | Status: AC
  Administered 2021-10-02 (×4): 10 meq via INTRAVENOUS
  Filled 2021-10-02 (×4): qty 100

## 2021-10-02 SURGICAL SUPPLY — 25 items

## 2021-10-02 NOTE — Op Note (Signed)
[facility] Patient Name: Richard Gates Procedure Date : 10/02/2021 MRN: [id number] Attending MD: Jerene Bears , MD Date of Birth: 07-Aug-1960 CSN: [id number] Age: 61 Admit Type: Inpatient Procedure:                Upper GI endoscopy Indications:              Chronic diarrhea, hx of GERD, anemia with B12 and                            folate deficiency Providers:                Lajuan Lines. Hilarie Fredrickson, MD, Vista Lawman, RN, Ladona Ridgel, Technician, Dellie Catholic, CRNA Referring MD:             [facility] Medicines:                Monitored Anesthesia Care Complications:            No immediate complications. Estimated Blood Loss:     Estimated blood loss was minimal. Procedure:                Pre-Anesthesia Assessment:                           - Prior to the procedure, a History and Physical                            was performed, and patient medications and                            allergies were reviewed. The patient's tolerance of                            previous anesthesia was also reviewed. The risks                            and benefits of the procedure and the sedation                            options and risks were discussed with the patient.                            All questions were answered, and informed consent                            was obtained. Prior Anticoagulants: The patient has                            taken no previous anticoagulant or antiplatelet                            agents. ASA Grade Assessment: II - A patient with  mild systemic disease. After reviewing the risks                            and benefits, the patient was deemed in                            satisfactory condition to undergo the procedure.                           After obtaining informed consent, the endoscope was                            passed under direct vision. Throughout the                             procedure, the patient's blood pressure, pulse, and                            oxygen saturations were monitored continuously. The                            [id number]-[id number] ([id number] Olympus endoscope was introduced                            through the mouth, and advanced to the third part                            of duodenum. The upper GI endoscopy was                            accomplished without difficulty. The patient                            tolerated the procedure well. Scope In: Scope Out: Findings:      LA Grade B (one or more mucosal breaks greater than 5 mm, not extending       between the tops of two mucosal folds) esophagitis with no bleeding was       found 33 to 35 cm from the incisors.      Patchy, yellow plaques were found at the cricopharyngeus, in the middle       third of the esophagus and in the lower third of the esophagus.      A 4 cm hiatal hernia was found. The proximal extent of the gastric folds       (end of tubular esophagus) was 35 to 39 cm from the incisors.      The gastroesophageal flap valve was visualized endoscopically and       classified as Hill Grade IV (no fold, wide open lumen, hiatal hernia       present).      Moderate inflammation characterized by erosions and granularity was       found in the gastric antrum. Biopsies were taken with a cold forceps for       histology and Helicobacter pylori testing.      Diffuse moderate inflammation characterized by congestion (edema),  erosions, friability, granularity and shallow ulcerations was found in       the duodenal bulb, in the second portion of the duodenum and in the       third portion of the duodenum. Biopsies were taken with a cold forceps       for histology. Impression:               - LA Grade B reflux esophagitis with no bleeding.                           - Esophageal plaques were found, consistent with                            candidiasis.                            - 4 cm hiatal hernia.                           - Antral gastritis with erosions. Biopsied.                           - Diffuse duodenitis. Biopsied. Moderate Sedation:      N/A Recommendation:           - Return patient to hospital ward for ongoing care.                           - Resume previous diet.                           - Continue present medications.                           - Fluconazole 200 mg x 1 day, 100 mg x 13 days.                           - Restart PPI with pantoprazole 40 mg BID-AC (this                            can replaced famotidine).                           - Continue B12 and folate replacement.                           - Rinse mouth after using Advair.                           - Follow-up gastrin levels given esophagitis,                            gastritis and duodenitis as ZE remains in the                            differential.                           -  Await pathology results.                           - See the other procedure note for documentation of                            additional recommendations. Procedure Code(s):        --- Professional ---                           [phone], Esophagogastroduodenoscopy, flexible,                            transoral; with biopsy, single or multiple Diagnosis Code(s):        --- Professional ---                           K21.00, Gastro-esophageal reflux disease with                            esophagitis, without bleeding                           K22.9, Disease of esophagus, unspecified                           K44.9, Diaphragmatic hernia without obstruction or                            gangrene                           K29.70, Gastritis, unspecified, without bleeding                           K29.80, Duodenitis without bleeding                           R19.7, Diarrhea, unspecified CPT copyright 2019 American Medical Association. All rights reserved. The codes documented in this report are  preliminary and upon coder review may  be revised to meet current compliance requirements. Jerene Bears, MD 10/02/2021 9:26:36 AM This report has been signed electronically. Number of Addenda: 0

## 2021-10-02 NOTE — Anesthesia Procedure Notes (Signed)
Procedure Name: MAC Date/Time: 10/02/2021 9:02 AM Performed by: Carolan Clines, CRNA Pre-anesthesia Checklist: Patient identified, Emergency Drugs available, Suction available and Patient being monitored Patient Re-evaluated:Patient Re-evaluated prior to induction Oxygen Delivery Method: Nasal cannula Dental Injury: Teeth and Oropharynx as per pre-operative assessment

## 2021-10-02 NOTE — Anesthesia Preprocedure Evaluation (Addendum)
Anesthesia Evaluation  Patient identified by MRN, date of birth, ID band Patient awake    Reviewed: Allergy & Precautions, H&P , NPO status , Patient's Chart, lab work & pertinent test results, reviewed documented beta blocker date and time   Airway Mallampati: III  TM Distance: >3 FB Neck ROM: Full    Dental no notable dental hx. (+) Edentulous Upper, Edentulous Lower, Dental Advisory Given   Pulmonary COPD,  COPD inhaler, former smoker,    Pulmonary exam normal breath sounds clear to auscultation       Cardiovascular hypertension, Pt. on medications and Pt. on home beta blockers + DOE   Rhythm:Regular Rate:Normal     Neuro/Psych Anxiety Depression negative neurological ROS     GI/Hepatic Neg liver ROS, GERD  Medicated,  Endo/Other  negative endocrine ROS  Renal/GU negative Renal ROS  negative genitourinary   Musculoskeletal   Abdominal   Peds  Hematology negative hematology ROS (+)   Anesthesia Other Findings   Reproductive/Obstetrics negative OB ROS                            Anesthesia Physical Anesthesia Plan  ASA: 2  Anesthesia Plan: MAC   Post-op Pain Management: Minimal or no pain anticipated   Induction: Intravenous  PONV Risk Score and Plan: 1 and Propofol infusion  Airway Management Planned: Natural Airway and Nasal Cannula  Additional Equipment:   Intra-op Plan:   Post-operative Plan:   Informed Consent: I have reviewed the patients History and Physical, chart, labs and discussed the procedure including the risks, benefits and alternatives for the proposed anesthesia with the patient or authorized representative who has indicated his/her understanding and acceptance.     Dental advisory given  Plan Discussed with: CRNA  Anesthesia Plan Comments:         Anesthesia Quick Evaluation

## 2021-10-02 NOTE — Op Note (Signed)
[facility] Patient Name: Richard Gates Procedure Date : 10/02/2021 MRN: [id number] Attending MD: Jerene Bears , MD Date of Birth: 13-Jan-1961 CSN: [id number] Age: 61 Admit Type: Inpatient Procedure:                Colonoscopy Indications:              Chronic diarrhea, Abnormal CT of the GI tract                            (suggesting colitis involving ascending to sigmoid                            colon) Providers:                Lajuan Lines. Hilarie Fredrickson, MD, Vista Lawman, RN, Ladona Ridgel, Technician, Dellie Catholic, CRNA Referring MD:             [facility] Medicines:                Monitored Anesthesia Care Complications:            No immediate complications. Estimated Blood Loss:     Estimated blood loss was minimal. Procedure:                Pre-Anesthesia Assessment:                           - Prior to the procedure, a History and Physical                            was performed, and patient medications and                            allergies were reviewed. The patient's tolerance of                            previous anesthesia was also reviewed. The risks                            and benefits of the procedure and the sedation                            options and risks were discussed with the patient.                            All questions were answered, and informed consent                            was obtained. Prior Anticoagulants: The patient has                            taken no previous anticoagulant or antiplatelet  agents. ASA Grade Assessment: II - A patient with                            mild systemic disease. After reviewing the risks                            and benefits, the patient was deemed in                            satisfactory condition to undergo the procedure.                           After obtaining informed consent, the colonoscope                            was  passed under direct vision. Throughout the                            procedure, the patient's blood pressure, pulse, and                            oxygen saturations were monitored continuously. The                            [id number]-HQ190TL ([id number]) Olympus peds colonoscope was                            introduced through the anus and advanced to the                            terminal ileum. Scope In: 9:28:25 AM Scope Out: 9:43:39 AM Scope Withdrawal Time: 0 hours 13 minutes 54 seconds  Total Procedure Duration: 0 hours 15 minutes 14 seconds  Findings:      The digital rectal exam was normal.      The terminal ileum appeared normal.      Many small and large-mouthed diverticula were found from cecum to       sigmoid colon.      Patchy area of mildly erythematous mucosa was found in the ascending,       transverse and sigmoid colon. This does not appear consistent with IBD,       query mild ischemic change or mild colitis related to diverticulosis.       This was biopsied with a cold forceps for histology, evaluation of       chronic inflammation and evaluation of microscopic colitis (Right colon       Jar 3, Left colon Jar 4).      Small internal hemorrhoids were found during retroflexion. The       hemorrhoids were small. Impression:               - The examined portion of the ileum was normal.                           - Severe diverticulosis from cecum to sigmoid colon.                           -  Erythematous mucosa, patchy in the ascending,                            transverse and sigmoid colon. Biopsied.                           - Small internal hemorrhoids. Moderate Sedation:      N/A Recommendation:           - Return patient to hospital ward for possible                            discharge same day.                           - Resume previous diet.                           - Continue present medications.                           - See EGD report.                            - Okay for lomotil 1 tablet QID PRN.                           - He should complete 24 hr urine collection today.                           - Repeat BMP next week (can be done at [facility]).                           - Await pathology results.                           - Remaining lab work to be followed up as it                            results.                           - Repeat colonoscopy is recommended. The                            colonoscopy date will be determined after pathology                            results from today's exam become available for                            review. Procedure Code(s):        --- Professional ---                           [phone], Colonoscopy, flexible; with biopsy, single  or multiple Diagnosis Code(s):        --- Professional ---                           K64.8, Other hemorrhoids                           K63.89, Other specified diseases of intestine                           K52.9, Noninfective gastroenteritis and colitis,                            unspecified                           K57.30, Diverticulosis of large intestine without                            perforation or abscess without bleeding                           R93.3, Abnormal findings on diagnostic imaging of                            other parts of digestive tract CPT copyright 2019 American Medical Association. All rights reserved. The codes documented in this report are preliminary and upon coder review may  be revised to meet current compliance requirements. Jerene Bears, MD 10/02/2021 10:09:41 AM This report has been signed electronically. Number of Addenda: 0

## 2021-10-02 NOTE — Plan of Care (Signed)

## 2021-10-02 NOTE — Progress Notes (Signed)
PT Cancellation Note  Patient Details Name: Richard Gates MRN: [id number] DOB: [birth year]   Cancelled Treatment:    Reason Eval/Treat Not Completed: PT screened, no needs identified, will sign off; spoke with pt and noted OT evaluation.  Patient denies concerns about mobility including stairs.  PT will sign off.    Reginia Naas 10/02/2021, 3:14 PM Magda Kiel, PT Acute Rehabilitation Services Pager:[phone] Office:[phone] 10/02/2021

## 2021-10-02 NOTE — Transfer of Care (Signed)
Immediate Anesthesia Transfer of Care Note  Patient: Richard Gates  Procedure(s) Performed: ESOPHAGOGASTRODUODENOSCOPY (EGD) WITH PROPOFOL COLONOSCOPY WITH PROPOFOL BIOPSY  Patient Location: PACU  Anesthesia Type:MAC  Level of Consciousness: awake, alert  and oriented  Airway & Oxygen Therapy: Patient Spontanous Breathing and Patient connected to nasal cannula oxygen  Post-op Assessment: Report given to RN and Post -op Vital signs reviewed and stable  Post vital signs: Reviewed and stable  Last Vitals:  Vitals Value Taken Time  BP 135/90   Temp    Pulse 89 10/02/21 0956  Resp 17 10/02/21 0956  SpO2 97 % 10/02/21 0956  Vitals shown include unvalidated device data.  Last Pain:  Vitals:   10/02/21 0845  TempSrc: Temporal  PainSc: 0-No pain         Complications: No notable events documented.

## 2021-10-02 NOTE — Interval H&P Note (Signed)
History and Physical Interval Note: For EGD and colonoscopy today to evaluate chronic diarrhea, B12 and folate deficiency with anemia The nature of the procedure, as well as the risks, benefits, and alternatives were carefully and thoroughly reviewed with the patient. Ample time for discussion and questions allowed. The patient understood, was satisfied, and agreed to proceed.   10/02/2021 9:00 AM  [name]  has presented today for surgery, with the diagnosis of Chronic diarrhea.  The various methods of treatment have been discussed with the patient and family. After consideration of risks, benefits and other options for treatment, the patient has consented to  Procedure(s): ESOPHAGOGASTRODUODENOSCOPY (EGD) WITH PROPOFOL (N/A) COLONOSCOPY WITH PROPOFOL (N/A) as a surgical intervention.  The patient's history has been reviewed, patient examined, no change in status, stable for surgery.  I have reviewed the patient's chart and labs.  Questions were answered to the patient's satisfaction.     Richard Gates

## 2021-10-02 NOTE — Procedures (Addendum)
I spoke to the patient after the procedure as well as his wife by phone. I discussed the endoscopic findings and plan with both of them Time provided for questions and answers and she thanked me for the call

## 2021-10-02 NOTE — Evaluation (Signed)
Occupational Therapy Evaluation Patient Details Name: Richard Gates MRN: [id number] DOB: 07-17-1960 Today's Date: 10/02/2021   History of Present Illness Richard Gates is a 61 y.o. male with medical history significant of COPD, HTN, and HLD presenting with abnormal labs.   Clinical Impression   Patient admitted for the diagnosis above.  PTA he lives at home with his spouse.  He works full time for [employer], and needed no assist with any aspect of his life.  His wife works nights.  Currently he presents with no deficits, and is at his baseline for ADL completion, and is walking without assist in room, and in the halls.  No further needs in the acute setting, and no post acute OT recommended.        Recommendations for follow up therapy are one component of a multi-disciplinary discharge planning process, led by the attending physician.  Recommendations may be updated based on patient status, additional functional criteria and insurance authorization.   Follow Up Recommendations  No OT follow up    Assistance Recommended at Discharge None  Patient can return home with the following      Functional Status Assessment  Patient has not had a recent decline in their functional status  Equipment Recommendations  None recommended by OT    Recommendations for Other Services       Precautions / Restrictions Precautions Precautions: None Restrictions Weight Bearing Restrictions: No      Mobility Bed Mobility Overal bed mobility: Independent                  Transfers Overall transfer level: Independent                        Balance Overall balance assessment: No apparent balance deficits (not formally assessed)                                         ADL either performed or assessed with clinical judgement   ADL Overall ADL's : At baseline                                             Vision Patient Visual Report:  No change from baseline       Perception Perception Perception: Within Functional Limits   Praxis Praxis Praxis: Intact    Pertinent Vitals/Pain Pain Assessment Pain Assessment: No/denies pain     Hand Dominance Right   Extremity/Trunk Assessment Upper Extremity Assessment Upper Extremity Assessment: Overall WFL for tasks assessed   Lower Extremity Assessment Lower Extremity Assessment: Overall WFL for tasks assessed   Cervical / Trunk Assessment Cervical / Trunk Assessment: Normal   Communication Communication Communication: No difficulties   Cognition Arousal/Alertness: Awake/alert Behavior During Therapy: WFL for tasks assessed/performed Overall Cognitive Status: Within Functional Limits for tasks assessed                                       General Comments   VSS on RA    Exercises     Shoulder Instructions      Home Living Family/patient expects to be discharged to:: Private residence Living Arrangements: Spouse/significant other  Available Help at Discharge: Family;Available PRN/intermittently Type of Home: House Home Access: Stairs to enter [employer] of Steps: 4 Entrance Stairs-Rails: Right;Left Home Layout: One level     Bathroom Shower/Tub: Tub/shower unit;Walk-in shower   Bathroom Toilet: Standard     Home Equipment: None          Prior Functioning/Environment Prior Level of Function : Independent/Modified Independent;Working/employed;Driving                        OT Problem List: Other (comment) (none)      OT Treatment/Interventions:      OT Goals(Current goals can be found in the care plan section) Acute Rehab OT Goals Patient Stated Goal: Hoping to go home tomorrow OT Goal Formulation: With patient Time For Goal Achievement: 10/04/21 Potential to Achieve Goals: Good  OT Frequency:      Co-evaluation              AM-PAC OT "6 Clicks" Daily Activity     Outcome Measure Help  from another person eating meals?: None Help from another person taking care of personal grooming?: None Help from another person toileting, which includes using toliet, bedpan, or urinal?: None Help from another person bathing (including washing, rinsing, drying)?: None Help from another person to put on and taking off regular upper body clothing?: None Help from another person to put on and taking off regular lower body clothing?: None 6 Click Score: 24   End of Session Nurse Communication: Mobility status  Activity Tolerance: Patient tolerated treatment well Patient left: in bed;with call bell/phone within reach  OT Visit Diagnosis: Other (comment) (GI Issues)                Time: 2595-6387 OT Time Calculation (min): 15 min Charges:  OT General Charges $OT Visit: 1 Visit OT Evaluation $OT Eval Moderate Complexity: 1 Mod  10/02/2021  RP, OTR/L  Acute Rehabilitation Services  Office:  [phone]   Metta Clines 10/02/2021, 12:05 PM

## 2021-10-02 NOTE — Progress Notes (Signed)
PROGRESS NOTE  Richard Gates  DOB: 1960-11-13  PCP: Colon Branch, MD OXB:[id number]  DOA: 09/30/2021  LOS: 1 day  Hospital Day: 3  Brief narrative: Richard Gates is a 61 y.o. male with PMH significant for HTN, HLD, CAD, COPD, GERD, colonic polyps and colonoscopy done 2 years ago, left lower extremity DVT in January 2023 for which he was prescribed Eliquis for 6 months but took only for 2 months.  Patient has diarrhea for last more than 6 months.  Last seen by GI as an outpatient on 1/20.  CT was negative at that time.  He had been on Protonix for 4 to 6 months prior to that visit and hence it was discontinued with concern that diarrhea was PPI induced.  Patient states his diarrhea improved with ambulatory care few weeks ago again. 5/31, patient was seen by PCP for poor appetite, lack of taste, diarrhea 5-8 times a day ongoing for about a month. PE she had a blood work done which resulted on 6/1 showing multiple electrolyte abnormalities and hence he was sent to the ED.  In the ED, patient was afebrile, heart rate 117, blood pressure 140/98, breathing on room air Labs in the ED showed potassium low at 2.3, calcium low at 6.2, magnesium low at 0.7 albumin 3.1, folic acid low at 2.8, vitamin B12 low at 234, WBC count normal, hemoglobin 11.6 EKG showed QTc prolongation Patient was given electrolyte replacements Kept in observation to hospitalist service  Subjective: Patient was seen and examined this morning.   Lying on bed.  Not in distress.  Underwent EGD and colonoscopy this morning.  Findings as below.  About 4-6 bowel movements in last 24 hours.  Labs this morning still showing low electrolytes.  Principal Problem:   Electrolyte disturbance Active Problems:   Hyperlipidemia with target LDL less than 70   COPD mixed type (HCC)   Hypertension   Diarrhea   Prolonged QT interval   Chronic diarrhea   Elevated liver enzymes   Abnormal CT scan, colon   Gastroesophageal reflux disease  with esophagitis without hemorrhage   Gastritis and gastroduodenitis    Assessment and Plan: Chronic diarrhea -Seen by GI.  -6/3 underwent EGD and colonoscopy.  Noted to have significant esophagitis, gastritis and duodenitis of.  Colonoscopy with diverticulosis.  Discussed with GI Dr. Hilarie Fredrickson.  Suspicion of Zollinger-Ellison syndrome.  Gastrin level is pending.  Biopsy from upper and lower endoscopy pending. -Patient had started on PPI twice daily, Lomotil 4 times daily as needed. -Continue to monitor diarrhea for next 24 hours.  If diarrhea improves, plan to discharge him home tomorrow.  Esophageal candidiasis -Fluconazole started for 2 weeks.  Hypokalemia/hypomagnesemia -Labs this morning show persistent low potassium, magnesium and phosphorus levels. IV and oral replacement given.   -Repeat labs tomorrow. Recent Labs  Lab 09/29/21 1614 09/30/21 1408 10/01/21 0403 10/02/21 0422  K 2.4* 2.3* 2.5* 2.8*  MG 0.6* 0.7* 1.7 1.5*  PHOS  --   --  1.5* 1.9*   Hypocalcemia -Calcium level low in the range of 6. -IV calcium gluconate for replacement.  Recheck tomorrow Recent Labs  Lab 09/29/21 1614 09/30/21 1408 10/01/21 0403 10/02/21 0422  CALCIUM 6.4* 6.2* 6.4* 7.2*    COPD -Continue bronchodilators   HTN -Blood pressure in normal range.  Was previously taking amlodipine, metoprolol but patient stopped taking them. -Continue monitor   HLD -Previously taking Lipitor and also stopped taking this -PCP f/u encouraged   Goals of care  Code Status: Full Code    Mobility: Encourage ambulation Skin assessment:     Nutritional status:  Body mass index is 22.19 kg/m.  Nutrition Problem: Inadequate oral intake Etiology: acute illness Signs/Symptoms: per patient/family report     Diet:  Diet Order             Diet Heart Room service appropriate? Yes; Fluid consistency: Thin  Diet effective now                   DVT prophylaxis:  enoxaparin (LOVENOX)  injection 40 mg Start: 09/30/21 1645   Antimicrobials: None Fluid: LR at 75 mill per hour to continue Consultants: GI Family Communication: None at bedside  Status is: Inpatient  Continue in-hospital care because: Needs IV fluid, IV and oral electrolyte replacement, monitor diarrhea Level of care: Progressive   Dispo: The patient is from: Home              Anticipated d/c is to: Hopefully back to home in 1 to 2 days              Patient currently is not medically stable to d/c.   Difficult to place patient No     Infusions:   lactated ringers 75 mL/hr at 10/01/21 1007    Scheduled Meds:  enoxaparin (LOVENOX) injection  40 mg Subcutaneous Q24H   feeding supplement  1 Container Oral TID BM   folic acid  1 mg Oral Daily   multivitamin with minerals  1 tablet Oral Daily   pantoprazole  40 mg Oral BID AC   sodium chloride flush  3 mL Intravenous Q12H    PRN meds: acetaminophen **OR** acetaminophen, diphenoxylate-atropine, hydrALAZINE, oxyCODONE   Antimicrobials: Anti-infectives (From admission, onward)    None       Objective: Vitals:   10/02/21 1015 10/02/21 1204  BP: (!) 148/83 (!) 147/80  Pulse: 83 (!) 106  Resp: 13 18  Temp:  97.9 F (36.6 C)  SpO2: 97% 100%    Intake/Output Summary (Last 24 hours) at 10/02/2021 1407 Last data filed at 10/02/2021 1127 Gross per 24 hour  Intake 3203.75 ml  Output 1230 ml  Net 1973.75 ml   Filed Weights   09/30/21 1359 09/30/21 1736  Weight: 69.9 kg 66.2 kg   Weight change:  Body mass index is 22.19 kg/m.   Physical Exam: General exam: Pleasant, middle-aged African-American male.  Not in physical distress Skin: No rashes, lesions or ulcers. HEENT: Atraumatic, normocephalic, no obvious bleeding Lungs: Clear to auscultation bilaterally CVS: Regular rate and rhythm, no murmur GI/Abd soft, nontender, nondistended, bowel sound present CNS: Alert, awake, oriented x3 Psychiatry: Mood appropriate Extremities: No pedal  edema, no calf tenderness  Data Review: I have personally reviewed the laboratory data and studies available.  F/u labs ordered Unresulted Labs (From admission, onward)     Start     Ordered   10/02/21 0109  Basic metabolic panel  Daily,   R      10/01/21 0844   10/02/21 0500  CBC with Differential/Platelet  Daily,   R      10/01/21 0844   10/02/21 0500  Zinc  Tomorrow morning,   R        10/01/21 1638   10/01/21 0920  Anti-smooth muscle antibody, IgG  Once,   R        10/01/21 0922   10/01/21 0920  Mitochondrial antibodies  Once,   R  10/01/21 0922   10/01/21 0919  5 HIAA, quantitative, urine, 24 hour  Once,   R        10/01/21 0922   10/01/21 0919  Chromogranin A  Once,   R        10/01/21 0922   10/01/21 0919  Gastrin  Once,   R        10/01/21 0922   10/01/21 0919  Calprotectin, Fecal  Once,   R        10/01/21 9688            Signed, Terrilee Croak, MD [facility] 10/02/2021

## 2021-10-02 NOTE — Anesthesia Postprocedure Evaluation (Signed)
Anesthesia Post Note  Patient: Richard Gates  Procedure(s) Performed: ESOPHAGOGASTRODUODENOSCOPY (EGD) WITH PROPOFOL COLONOSCOPY WITH PROPOFOL BIOPSY     Patient location during evaluation: PACU Anesthesia Type: MAC Level of consciousness: awake and alert Pain management: pain level controlled Vital Signs Assessment: post-procedure vital signs reviewed and stable Respiratory status: spontaneous breathing, nonlabored ventilation and respiratory function stable Cardiovascular status: stable and blood pressure returned to baseline Postop Assessment: no apparent nausea or vomiting Anesthetic complications: no   No notable events documented.  Last Vitals:  Vitals:   10/02/21 1005 10/02/21 1015  BP: 128/71 (!) 148/83  Pulse: 82 83  Resp: 16 13  Temp:    SpO2: 97% 97%    Last Pain:  Vitals:   10/02/21 1015  TempSrc:   PainSc: 0-No pain                 Deaunna Olarte,W. EDMOND

## 2021-10-02 NOTE — Progress Notes (Signed)
PT Cancellation Note  Patient Details Name: Richard Gates MRN: [id number] DOB: 23-Jan-1961   Cancelled Treatment:    Reason Eval/Treat Not Completed: Patient at procedure or test/unavailable; patient down for endoscopy.  Will attempt later as pt able and schedule permits.    Reginia Naas 10/02/2021, 8:48 AM Magda Kiel, PT Acute Rehabilitation Services JTTSV:[phone] Office:[phone] 10/02/2021

## 2021-10-02 NOTE — Progress Notes (Signed)
   10/02/21 1640  Assess: MEWS Score  Temp 98.4 F (36.9 C)  BP 123/70  MAP (mmHg) 86  Pulse Rate (!) 112  ECG Heart Rate (!) 112  Resp 20  SpO2 99 %  O2 Device Room Air  Assess: MEWS Score  MEWS Temp 0  MEWS Systolic 0  MEWS Pulse 2  MEWS RR 0  MEWS LOC 0  MEWS Score 2  MEWS Score Color Yellow  Treat  Pain Scale Faces  Pain Score 0  Notify: Charge Nurse/RN  Name of Charge Nurse/RN Notified kristin  Date Charge Nurse/RN Notified 10/02/21  Time Charge Nurse/RN Notified 1645  Assess: SIRS CRITERIA  SIRS Temperature  0  SIRS Pulse 1  SIRS Respirations  0  SIRS WBC 0  SIRS Score Sum  1

## 2021-10-03 LAB — CBC WITH DIFFERENTIAL/PLATELET
Abs Immature Granulocytes: 0.03 10*3/uL (ref 0.00–0.07)
Basophils Absolute: 0 10*3/uL (ref 0.0–0.1)
Basophils Relative: 1 %
Eosinophils Absolute: 0.4 10*3/uL (ref 0.0–0.5)
Eosinophils Relative: 5 %
HCT: 27.6 % — ABNORMAL LOW (ref 39.0–52.0)
Hemoglobin: 9.6 g/dL — ABNORMAL LOW (ref 13.0–17.0)
Immature Granulocytes: 0 %
Lymphocytes Relative: 23 %
Lymphs Abs: 1.6 10*3/uL (ref 0.7–4.0)
MCH: 34 pg (ref 26.0–34.0)
MCHC: 34.8 g/dL (ref 30.0–36.0)
MCV: 97.9 fL (ref 80.0–100.0)
Monocytes Absolute: 0.3 10*3/uL (ref 0.1–1.0)
Monocytes Relative: 5 %
Neutro Abs: 4.4 10*3/uL (ref 1.7–7.7)
Neutrophils Relative %: 66 %
Platelets: 226 10*3/uL (ref 150–400)
RBC: 2.82 MIL/uL — ABNORMAL LOW (ref 4.22–5.81)
RDW: 13.8 % (ref 11.5–15.5)
WBC: 6.7 10*3/uL (ref 4.0–10.5)
nRBC: 0 % (ref 0.0–0.2)

## 2021-10-03 LAB — BASIC METABOLIC PANEL
Anion gap: 7 (ref 5–15)
BUN: 6 mg/dL — ABNORMAL LOW (ref 8–23)
CO2: 25 mmol/L (ref 22–32)
Calcium: 7.2 mg/dL — ABNORMAL LOW (ref 8.9–10.3)
Chloride: 103 mmol/L (ref 98–111)
Creatinine, Ser: 0.73 mg/dL (ref 0.61–1.24)
GFR, Estimated: >60 mL/min (ref >60)
Glucose, Bld: 102 mg/dL — ABNORMAL HIGH (ref 70–99)
Potassium: 2.8 mmol/L — ABNORMAL LOW (ref 3.5–5.1)
Sodium: 135 mmol/L (ref 135–145)

## 2021-10-03 LAB — PHOSPHORUS: Phosphorus: 2.8 mg/dL (ref 2.5–4.6)

## 2021-10-03 LAB — MAGNESIUM: Magnesium: 1.6 mg/dL — ABNORMAL LOW (ref 1.7–2.4)

## 2021-10-03 MED ORDER — DIPHENOXYLATE-ATROPINE 2.5-0.025 MG PO TABS: 1.0000 | ORAL_TABLET | Freq: Four times a day (QID) | ORAL | 0 refills | Status: AC | PRN

## 2021-10-03 MED ORDER — POTASSIUM CHLORIDE CRYS ER 20 MEQ PO TBCR: 40.0000 meq | EXTENDED_RELEASE_TABLET | Freq: Every day | ORAL | 0 refills | Status: DC

## 2021-10-03 MED ORDER — POTASSIUM CHLORIDE CRYS ER 20 MEQ PO TBCR
40.0000 meq | EXTENDED_RELEASE_TABLET | ORAL | Status: AC
  Administered 2021-10-03: 40 meq via ORAL
  Filled 2021-10-03: qty 2

## 2021-10-03 MED ORDER — VITAMIN D3 25 MCG PO TABS: 3000.0000 [IU] | ORAL_TABLET | Freq: Every day | ORAL | 2 refills | Status: DC

## 2021-10-03 MED ORDER — ENSURE ENLIVE PO LIQD: 237.0000 mL | Freq: Three times a day (TID) | ORAL | 12 refills | Status: DC

## 2021-10-03 MED ORDER — PANTOPRAZOLE SODIUM 40 MG PO TBEC: 40.0000 mg | DELAYED_RELEASE_TABLET | Freq: Two times a day (BID) | ORAL | 2 refills | Status: DC

## 2021-10-03 MED ORDER — MAGNESIUM OXIDE 400 MG PO CAPS: 400.0000 mg | ORAL_CAPSULE | Freq: Every day | ORAL | 0 refills | Status: AC

## 2021-10-03 MED ORDER — POTASSIUM CHLORIDE CRYS ER 20 MEQ PO TBCR: 40.0000 meq | EXTENDED_RELEASE_TABLET | Freq: Once | ORAL | Status: DC

## 2021-10-03 MED ORDER — K PHOS MONO-SOD PHOS DI & MONO 155-852-130 MG PO TABS
500.0000 mg | ORAL_TABLET | Freq: Once | ORAL | Status: DC
  Filled 2021-10-03: qty 2

## 2021-10-03 MED ORDER — VITAMIN D (ERGOCALCIFEROL) 1.25 MG (50000 UNIT) PO CAPS
50000.0000 [IU] | ORAL_CAPSULE | ORAL | Status: DC
  Administered 2021-10-03: 50000 [IU] via ORAL
  Filled 2021-10-03: qty 1

## 2021-10-03 MED ORDER — MAGNESIUM SULFATE 2 GM/50ML IV SOLN
2.0000 g | Freq: Once | INTRAVENOUS | Status: AC
Start: 2021-10-03 — End: 2021-10-03
  Administered 2021-10-03: 2 g via INTRAVENOUS
  Filled 2021-10-03: qty 50

## 2021-10-03 NOTE — Plan of Care (Signed)
  Problem: Education: Goal: Knowledge of General Education information will improve Description Including pain rating scale, medication(s)/side effects and non-pharmacologic comfort measures Outcome: Progressing   

## 2021-10-03 NOTE — Progress Notes (Signed)
Patient not seen today but chart reviewed  Continues to have significant electrolyte derangements with hypokalemia and hypomagnesemia.  Continue twice daily PPI, okay for Lomotil 1 to 2 tablets 4 times daily as needed  GI plan which will likely pick up as an outpatient, unless pathology results available prior to discharge: --Await pathology from EGD and colonoscopic biopsies --Correct electrolyte derangements per hospitalist medicine, including vitamin D supplementation --Follow-up 24-hour urine 5-HIAA, serum gastrin and chromogranin level

## 2021-10-03 NOTE — TOC Initial Note (Signed)
Transition of Care [facility]) - Initial/Assessment Note    Patient Details  Name: Richard Gates MRN: [id number] Date of Birth: 09-01-1960  [facility] CM/SW Contact:    Verdell Carmine, RN Phone Number: 10/03/2021, 8:18 AM  Clinical Narrative:                 Transition of Care team has reviewed the chart and found no needs at this time. TOC will continue to monitor the patient for any needs. If needs are identified, please place a TOC consult        Patient Goals and CMS Choice        Expected Discharge Plan and Services                                                Prior Living Arrangements/Services                       Activities of Daily Living Home Assistive Devices/Equipment: None ADL Screening (condition at time of admission) Patient's cognitive ability adequate to safely complete daily activities?: Yes Is the patient deaf or have difficulty hearing?: No Does the patient have difficulty seeing, even when wearing glasses/contacts?: No Does the patient have difficulty concentrating, remembering, or making decisions?: No Patient able to express need for assistance with ADLs?: Yes Does the patient have difficulty dressing or bathing?: No Independently performs ADLs?: Yes (appropriate for developmental age) Does the patient have difficulty walking or climbing stairs?: No Weakness of Legs: None Weakness of Arms/Hands: None  Permission Sought/Granted                  Emotional Assessment              Admission diagnosis:  Electrolyte disturbance [E87.8] Patient Active Problem List   Diagnosis Date Noted   Gastroesophageal reflux disease with esophagitis without hemorrhage    Gastritis and gastroduodenitis    Chronic diarrhea    Elevated liver enzymes    Abnormal CT scan, colon    Electrolyte disturbance 09/30/2021   Hypokalemia 05/30/2021   Hypomagnesemia 05/30/2021   Hypocalcemia 05/30/2021   Observed  seizure-like activity ([place]) 05/30/2021   Renal insufficiency    Diarrhea    Prolonged QT interval    Erectile dysfunction 09/07/2018   Hyperlipidemia with target LDL less than 70 08/17/2018   Left main coronary artery disease 06/15/2018   Coronary artery calcification seen on computed tomography 06/15/2018   DOE (dyspnea on exertion) 06/15/2018   Acute respiratory failure with hypoxia ([place]) 04/16/2018   Hypertension    Elevated troponin I level    COPD mixed type ([place]) 07/19/2016   PCP NOTES >>>>>>>>>>>>>>> 06/23/2016   Tobacco abuse 02/07/2012   Anxiety and depression 02/07/2012   Tachycardia 01/03/2012   Annual physical exam 05/12/2011   PCP:  Colon Branch, MD Pharmacy:   [facility] [phone] - [place], [address] [address] Phone: [phone] Fax: [phone]     Social Determinants of Health (SDOH) Interventions    Readmission Risk Interventions     View : No data to display.

## 2021-10-03 NOTE — Discharge Summary (Signed)
Physician Discharge Summary  HELDER CRISAFULLI BOF:[id number] DOB: 1961/02/15 DOA: 09/30/2021  PCP: Colon Branch, MD  Admit date: 09/30/2021 Discharge date: 10/03/2021  Admitted From: Home Discharge disposition: Home  Recommendations at discharge:  Take potassium and magnesium supplements for 3 days Protonix twice daily for next several weeks Lomotil as needed for diarrhea Follow-up with GI as an outpatient   Brief narrative: Richard Gates is a 61 y.o. male with PMH significant for HTN, HLD, CAD, COPD, GERD, colonic polyps and colonoscopy done 2 years ago, left lower extremity DVT in January 2023 for which he was prescribed Eliquis for 6 months but took only for 2 months.  Patient has diarrhea for last more than 6 months.  Last seen by GI as an outpatient on 1/20.  CT was negative at that time.  He had been on Protonix for 4 to 6 months prior to that visit and hence it was discontinued with concern that diarrhea was PPI induced.  Patient states his diarrhea improved with ambulatory care few weeks ago again. 5/31, patient was seen by PCP for poor appetite, lack of taste, diarrhea 5-8 times a day ongoing for about a month. PE she had a blood work done which resulted on 6/1 showing multiple electrolyte abnormalities and hence he was sent to the ED.  In the ED, patient was afebrile, heart rate 117, blood pressure 140/98, breathing on room air Labs in the ED showed potassium low at 2.3, calcium low at 6.2, magnesium low at 0.7 albumin 3.1, folic acid low at 2.8, vitamin B12 low at 234, WBC count normal, hemoglobin 11.6 EKG showed QTc prolongation Patient was given electrolyte replacements Kept in observation to hospitalist service  Subjective: Patient was seen and examined this morning.   Lying down in bed.  Not in distress.  2 episodes of bowel movement since yesterday morning. Electrolytes low today but improving.  Replacement given. Patient feels ready to go home.  Principal Problem:    Electrolyte disturbance Active Problems:   Hyperlipidemia with target LDL less than 70   COPD mixed type (HCC)   Hypertension   Diarrhea   Prolonged QT interval   Chronic diarrhea   Elevated liver enzymes   Abnormal CT scan, colon   Gastroesophageal reflux disease with esophagitis without hemorrhage   Gastritis and gastroduodenitis    Assessment and Plan: Chronic diarrhea -Seen by GI.  -6/3 underwent EGD and colonoscopy.  Noted to have significant esophagitis, gastritis and duodenitis of.  Colonoscopy with diverticulosis.  Discussed with GI Dr. Hilarie Fredrickson.  Suspicion of Zollinger-Ellison syndrome.  Gastrin level is pending.  Biopsy from upper and lower endoscopy pending.  Patient to follow-up with GI as an outpatient. -Patient has been started on PPI twice daily, Lomotil 4 times daily as needed.  Continue the same post discharge  Esophageal candidiasis -Fluconazole started for 2 weeks.  Hypokalemia/hypomagnesemia -Morning labs show persistent low potassium, magnesium and phosphorus levels.  With IV and oral replacements, electrolyte levels improving.  Further replacement given today.  We will discharge the patient on 3 more days of potassium and magnesium oral replacement. Recent Labs  Lab 09/29/21 1614 09/30/21 1408 10/01/21 0403 10/02/21 0422 10/03/21 0342  K 2.4* 2.3* 2.5* 2.8* 2.8*  MG 0.6* 0.7* 1.7 1.5* 1.6*  PHOS  --   --  1.5* 1.9* 2.8   Hypocalcemia -Calcium level monitored and replaced. Recent Labs  Lab 09/29/21 1614 09/30/21 1408 10/01/21 0403 10/02/21 0422 10/03/21 0342  CALCIUM 6.4* 6.2* 6.4*  7.2* 7.2*    COPD -Continue bronchodilators   HTN -Blood pressure mostly normalized without meds.     HLD -Previously taking Lipitor and also stopped taking this -PCP f/u encouraged  Vitamin D deficiency -Vitamin D level low at 13.  Replacement given with 3000 units daily.  Follow-up with PCP as an outpatient  Folic acid deficiency -Folic acid level low at  2.8. -Replacement ordered.   Goals of care   Code Status: Full Code    Mobility: Encourage ambulation Skin assessment:     Nutritional status:  Body mass index is 22.19 kg/m.  Nutrition Problem: Inadequate oral intake Etiology: acute illness Signs/Symptoms: per patient/family report      Wounds:  -    Discharge Exam:   Vitals:   10/02/21 2339 10/03/21 0338 10/03/21 0738 10/03/21 1156  BP:  139/73 (!) 141/76 (!) 151/79  Pulse:  97 96 86  Resp: '19 16 20 19  '$ Temp:  98.1 F (36.7 C) 98.2 F (36.8 C) 98.1 F (36.7 C)  TempSrc:  Oral Oral Oral  SpO2:  99%  100%  Weight:      Height:        Body mass index is 22.19 kg/m.  General exam: Pleasant, middle-aged African-American male.  Not in physical distress Skin: No rashes, lesions or ulcers. HEENT: Atraumatic, normocephalic, no obvious bleeding Lungs: Clear to auscultation bilaterally CVS: Regular rate and rhythm, no murmur GI/Abd soft, nontender, nondistended, bowel sound present CNS: Alert, awake, oriented x3 Psychiatry: Mood appropriate Extremities: No pedal edema, no calf tenderness  Follow ups:    Follow-up [place], MD Follow up.   Specialty: Internal Medicine Contact information: [phone] [facility] [place] [address] [phone]         Leonie Man, MD .   Specialty: Cardiology Contact information: [address] [place] [address] [phone]                 Discharge Instructions:   Discharge Instructions     Call MD for:  difficulty breathing, headache or visual disturbances   Complete by: As directed    Call MD for:  extreme fatigue   Complete by: As directed    Call MD for:  hives   Complete by: As directed    Call MD for:  persistant dizziness or light-headedness   Complete by: As directed    Call MD for:  persistant nausea and vomiting   Complete by: As directed    Call MD for:  severe  uncontrolled pain   Complete by: As directed    Call MD for:  temperature >100.4   Complete by: As directed    Diet general   Complete by: As directed    Discharge instructions   Complete by: As directed    Recommendations at discharge:   Take potassium and magnesium supplements for 3 days  Protonix twice daily for next several weeks  Lomotil as needed for diarrhea  Follow-up with GI as an outpatient  General discharge instructions: Follow with Primary MD Colon Branch, MD in 7 days  Please request your PCP  to go over your hospital tests, procedures, radiology results at the follow up. Please get your medicines reviewed and adjusted.  Your PCP may decide to repeat certain labs or tests as needed. Do not drive, operate heavy machinery, perform activities at heights, swimming or participation in water activities or provide  baby sitting services if your were admitted for syncope or siezures until you have seen by Primary MD or a Neurologist and advised to do so again. [place] Controlled Substance Reporting System database was reviewed. Do not drive, operate heavy machinery, perform activities at heights, swim, participate in water activities or provide baby-sitting services while on medications for pain, sleep and mood until your outpatient physician has reevaluated you and advised to do so again.  You are strongly recommended to comply with the dose, frequency and duration of prescribed medications. Activity: As tolerated with Full fall precautions use walker/cane & assistance as needed Avoid using any recreational substances like cigarette, tobacco, alcohol, or non-prescribed drug. If you experience worsening of your admission symptoms, develop shortness of breath, life threatening emergency, suicidal or homicidal thoughts you must seek medical attention immediately by calling 911 or calling your MD immediately  if symptoms less severe. You must read complete instructions/literature  along with all the possible adverse reactions/side effects for all the medicines you take and that have been prescribed to you. Take any new medicine only after you have completely understood and accepted all the possible adverse reactions/side effects.  Wear Seat belts while driving. You were cared for by a hospitalist during your hospital stay. If you have any questions about your discharge medications or the care you received while you were in the hospital after you are discharged, you can call the unit and ask to speak with the hospitalist or the covering physician. Once you are discharged, your primary care physician will handle any further medical issues. Please note that NO REFILLS for any discharge medications will be authorized once you are discharged, as it is imperative that you return to your primary care physician (or establish a relationship with a primary care physician if you do not have one).   Increase activity slowly   Complete by: As directed        Discharge Medications:   Allergies as of 10/03/2021   No Known Allergies      Medication List     STOP taking these medications    amLODipine 10 MG tablet Commonly known as: NORVASC   atorvastatin 80 MG tablet Commonly known as: LIPITOR   famotidine 20 MG tablet Commonly known as: PEPCID   metoprolol tartrate 100 MG tablet Commonly known as: LOPRESSOR       TAKE these medications    albuterol 108 (90 Base) MCG/ACT inhaler Commonly known as: VENTOLIN HFA INHALE 2 PUFFS INTO THE LUNGS EVERY 6 HOURS AS NEEDED FOR WHEEZING OR SHORTNESS OF BREATH What changed: how to take this   diphenoxylate-atropine 2.5-0.025 MG tablet Commonly known as: LOMOTIL Take 1 tablet by mouth 4 (four) times daily as needed for up to 7 days for diarrhea or loose stools.   feeding supplement Liqd Take 237 mLs by mouth 3 (three) times daily between meals.   fluticasone-salmeterol 250-50 MCG/ACT Aepb Commonly known as: ADVAIR Inhale  1 puff into the lungs in the morning and at bedtime.   Magnesium Oxide 400 MG Caps Take 1 capsule (400 mg total) by mouth daily at 6 (six) AM for 3 days.   pantoprazole 40 MG tablet Commonly known as: PROTONIX Take 1 tablet (40 mg total) by mouth 2 (two) times daily before a meal. What changed: See the new instructions.   potassium chloride SA 20 MEQ tablet Commonly known as: KLOR-CON M Take 2 tablets (40 mEq total) by mouth daily for 3 days.   Vitamin  D3 25 MCG tablet Commonly known as: Vitamin D Take 3 tablets (3,000 Units total) by mouth daily. Start taking on: October 04, 2021         The results of significant diagnostics from this hospitalization (including imaging, microbiology, ancillary and laboratory) are listed below for reference.    Procedures and Diagnostic Studies:   CT ABDOMEN PELVIS W CONTRAST  Result Date: 10/01/2021 CLINICAL DATA:  Nausea, vomiting, chronic diarrhea EXAM: CT ABDOMEN AND PELVIS WITH CONTRAST TECHNIQUE: Multidetector CT imaging of the abdomen and pelvis was performed using the standard protocol following bolus administration of intravenous contrast. RADIATION DOSE REDUCTION: This exam was performed according to the departmental dose-optimization program which includes automated exposure control, adjustment of the mA and/or kV according to patient size and/or use of iterative reconstruction technique. CONTRAST:  42m OMNIPAQUE IOHEXOL 300 MG/ML  SOLN COMPARISON:  Previous sonogram including renal sonogram done on 06/09/2021 FINDINGS: Lower chest: Unremarkable. Hepatobiliary: There is fatty infiltration in the liver. No focal abnormality is seen. There is no dilation of bile ducts. Gallbladder is unremarkable. Pancreas: No focal abnormality is seen. Spleen: Unremarkable. Adrenals/Urinary Tract: There is mild hyperplasia of left adrenal. There is no hydronephrosis. There are no renal or ureteral stones. Urinary bladder is unremarkable. Stomach/Bowel: Small  hiatal hernia is seen. Stomach is not distended. Small bowel loops are unremarkable. Appendix is not seen. There is no pericecal inflammation. Scattered diverticula are seen in the colon. There is diffuse wall thickening and low-attenuation in the submucosal region in the ascending, transverse and descending colon. There is wall thickening in the sigmoid colon. There is no pericolic stranding or fluid collection. Vascular/Lymphatic: There are scattered atherosclerotic plaques and calcifications in the aorta and its major branches. Reproductive: Unremarkable. Other: There is no ascites or pneumoperitoneum. Small umbilical hernia containing fat is seen. Musculoskeletal: Unremarkable. IMPRESSION: There is no evidence of intestinal obstruction or pneumoperitoneum. There is no hydronephrosis. There is mild diffuse wall thickening and low-density in the submucosal region in the ascending, transverse and descending colon suggesting possible chronic colitis. There is wall thickening in the sigmoid colon which may be due to incomplete distention or suggest inflammatory or infectious colitis. Diverticulosis of colon without signs of focal acute diverticulitis. Fatty liver.  Small hiatal hernia. Other findings as described in the body of the report. Electronically Signed   By: PElmer PickerM.D.   On: 10/01/2021 15:31     Labs:   Basic Metabolic Panel: Recent Labs  Lab 09/29/21 1614 09/30/21 1408 10/01/21 0403 10/02/21 0422 10/03/21 0342  NA 140 137 136 137 135  K 2.4* 2.3* 2.5* 2.8* 2.8*  CL 91* 92* 98 104 103  CO2 32 '31 30 23 25  '$ GLUCOSE 114* 107* 108* 97 102*  BUN 9 6* <5* <5* 6*  CREATININE 0.94 0.93 0.78 0.73 0.73  CALCIUM 6.4* 6.2* 6.4* 7.2* 7.2*  MG 0.6* 0.7* 1.7 1.5* 1.6*  PHOS  --   --  1.5* 1.9* 2.8   GFR Estimated Creatinine Clearance: 90.8 mL/min (by C-G formula based on SCr of 0.73 mg/dL). Liver Function Tests: Recent Labs  Lab 09/30/21 1408 10/01/21 0940  AST 48* 37  ALT 20  18  ALKPHOS 135* 126  BILITOT 1.0 0.9  PROT 6.1* 5.7*  ALBUMIN 3.1* 2.9*   No results for input(s): LIPASE, AMYLASE in the last 168 hours. No results for input(s): AMMONIA in the last 168 hours. Coagulation profile Recent Labs  Lab 10/01/21 0940  INR 1.2  CBC: Recent Labs  Lab 09/30/21 1408 10/01/21 0403 10/02/21 0422 10/03/21 0342  WBC 5.0 6.1 6.0 6.7  NEUTROABS  --   --  3.8 4.4  HGB 11.6* 10.2* 11.0* 9.6*  HCT 32.7* 29.9* 30.9* 27.6*  MCV 95.3 96.8 97.2 97.9  PLT 279 245 246 226   Cardiac Enzymes: No results for input(s): CKTOTAL, CKMB, CKMBINDEX, TROPONINI in the last 168 hours. BNP: Invalid input(s): POCBNP CBG: No results for input(s): GLUCAP in the last 168 hours. D-Dimer No results for input(s): DDIMER in the last 72 hours. Hgb A1c No results for input(s): HGBA1C in the last 72 hours. Lipid Profile No results for input(s): CHOL, HDL, LDLCALC, TRIG, CHOLHDL, LDLDIRECT in the last 72 hours. Thyroid function studies No results for input(s): TSH, T4TOTAL, T3FREE, THYROIDAB in the last 72 hours.  Invalid input(s): FREET3 Anemia work up No results for input(s): VITAMINB12, FOLATE, FERRITIN, TIBC, IRON, RETICCTPCT in the last 72 hours. Microbiology Recent Results (from the past 240 hour(s))  C Difficile Quick Screen w PCR reflex     Status: None   Collection Time: 09/30/21  3:30 PM   Specimen: STOOL  Result Value Ref Range Status   C Diff antigen NEGATIVE NEGATIVE Final   C Diff toxin NEGATIVE NEGATIVE Final   C Diff interpretation No C. difficile detected.  Final    Comment: Performed at [facility], [place] [address] [place], [place] [address]  Gastrointestinal Panel by PCR , Stool     Status: None   Collection Time: 09/30/21  3:30 PM   Specimen: Stool  Result Value Ref Range Status   Campylobacter species NOT DETECTED NOT DETECTED Final   Plesimonas shigelloides NOT DETECTED NOT DETECTED Final   Salmonella species NOT DETECTED NOT DETECTED  Final   Yersinia enterocolitica NOT DETECTED NOT DETECTED Final   Vibrio species NOT DETECTED NOT DETECTED Final   Vibrio cholerae NOT DETECTED NOT DETECTED Final   Enteroaggregative E coli (EAEC) NOT DETECTED NOT DETECTED Final   Enteropathogenic E coli (EPEC) NOT DETECTED NOT DETECTED Final   Enterotoxigenic E coli (ETEC) NOT DETECTED NOT DETECTED Final   Shiga like toxin producing E coli (STEC) NOT DETECTED NOT DETECTED Final   Shigella/Enteroinvasive E coli (EIEC) NOT DETECTED NOT DETECTED Final   Cryptosporidium NOT DETECTED NOT DETECTED Final   Cyclospora cayetanensis NOT DETECTED NOT DETECTED Final   Entamoeba histolytica NOT DETECTED NOT DETECTED Final   Giardia lamblia NOT DETECTED NOT DETECTED Final   Adenovirus F40/41 NOT DETECTED NOT DETECTED Final   Astrovirus NOT DETECTED NOT DETECTED Final   Norovirus GI/GII NOT DETECTED NOT DETECTED Final   Rotavirus A NOT DETECTED NOT DETECTED Final   Sapovirus (I, II, IV, and V) NOT DETECTED NOT DETECTED Final    Comment: Performed at [facility], [address] [place], [place] [address]    Time coordinating discharge: 35 minutes  Signed: Marlowe Aschoff Presley Gora  [facility] 10/03/2021, 12:04 PM

## 2021-10-04 ENCOUNTER — Encounter (HOSPITAL_COMMUNITY): Payer: Self-pay | Admitting: Internal Medicine

## 2021-10-04 ENCOUNTER — Telehealth: Payer: Self-pay

## 2021-10-04 LAB — GASTRIN: Gastrin: 36 pg/mL (ref 0–115)

## 2021-10-04 LAB — CHROMOGRANIN A: Chromogranin A (ng/mL): 136.5 ng/mL — ABNORMAL HIGH (ref 0.0–101.8)

## 2021-10-04 NOTE — Telephone Encounter (Signed)
Transition Care Management Follow-up Telephone Call Date of discharge and from where: [facility] 10-03-21 Dx: Chronic diarrhea How have you been since you were released from the hospital? Doing ok  Any questions or concerns? No  Items Reviewed: Did the pt receive and understand the discharge instructions provided? Yes  Medications obtained and verified? Yes  Other? No  Any new allergies since your discharge? No  Dietary orders reviewed? Yes Do you have support at home? Yes   Home Care and Equipment/Supplies: Were home health services ordered? no If so, what is the name of the agency? na  Has the agency set up a time to come to the patient's home? not applicable Were any new equipment or medical supplies ordered?  No What is the name of the medical supply agency? na Were you able to get the supplies/equipment? not applicable Do you have any questions related to the use of the equipment or supplies? No  Functional Questionnaire: (I = Independent and D = Dependent) ADLs: I  Bathing/Dressing- I  Meal Prep- I  Eating- I  Maintaining continence- I  Transferring/Ambulation- I  Managing Meds- I  Follow up appointments reviewed:  PCP Hospital f/u appt confirmed? Yes  Scheduled to see Dr Larose Kells  on 10-05-21 @ [facility] f/u appt confirmed? No . Are transportation arrangements needed? No  If their condition worsens, is the pt aware to call PCP or go to the Emergency Dept.? Yes Was the patient provided with contact information for the PCP's office or ED? Yes Was to pt encouraged to call back with questions or concerns? Yes

## 2021-10-05 ENCOUNTER — Ambulatory Visit (INDEPENDENT_AMBULATORY_CARE_PROVIDER_SITE_OTHER): Payer: 59 | Admitting: Internal Medicine

## 2021-10-05 ENCOUNTER — Encounter: Payer: Self-pay | Admitting: Internal Medicine

## 2021-10-05 VITALS — BP 120/74 | HR 81 | Temp 98.0°F | Resp 18 | Ht 66.0 in | Wt 154.2 lb

## 2021-10-05 DIAGNOSIS — E878 Other disorders of electrolyte and fluid balance, not elsewhere classified: Secondary | ICD-10-CM | POA: Diagnosis not present

## 2021-10-05 DIAGNOSIS — R197 Diarrhea, unspecified: Secondary | ICD-10-CM | POA: Diagnosis not present

## 2021-10-05 DIAGNOSIS — E538 Deficiency of other specified B group vitamins: Secondary | ICD-10-CM

## 2021-10-05 DIAGNOSIS — I10 Essential (primary) hypertension: Secondary | ICD-10-CM

## 2021-10-05 LAB — COMPREHENSIVE METABOLIC PANEL
ALT: 19 U/L (ref 0–53)
AST: 36 U/L (ref 0–37)
Albumin: 3.4 g/dL — ABNORMAL LOW (ref 3.5–5.2)
Alkaline Phosphatase: 108 U/L (ref 39–117)
BUN: 8 mg/dL (ref 6–23)
CO2: 27 mEq/L (ref 19–32)
Calcium: 8.8 mg/dL (ref 8.4–10.5)
Chloride: 105 mEq/L (ref 96–112)
Creatinine, Ser: 0.78 mg/dL (ref 0.40–1.50)
GFR: 96.27 mL/min (ref >60.00)
Glucose, Bld: 87 mg/dL (ref 70–99)
Potassium: 4.7 mEq/L (ref 3.5–5.1)
Sodium: 139 mEq/L (ref 135–145)
Total Bilirubin: 0.3 mg/dL (ref 0.2–1.2)
Total Protein: 6.1 g/dL (ref 6.0–8.3)

## 2021-10-05 LAB — CBC WITH DIFFERENTIAL/PLATELET
Basophils Absolute: 0 10*3/uL (ref 0.0–0.1)
Basophils Relative: 0.7 % (ref 0.0–3.0)
Eosinophils Absolute: 0.2 10*3/uL (ref 0.0–0.7)
Eosinophils Relative: 3.5 % (ref 0.0–5.0)
HCT: 31.2 % — ABNORMAL LOW (ref 39.0–52.0)
Hemoglobin: 10.4 g/dL — ABNORMAL LOW (ref 13.0–17.0)
Lymphocytes Relative: 20.6 % (ref 12.0–46.0)
Lymphs Abs: 1.3 10*3/uL (ref 0.7–4.0)
MCHC: 33.3 g/dL (ref 30.0–36.0)
MCV: 102.9 fl — ABNORMAL HIGH (ref 78.0–100.0)
Monocytes Absolute: 0.6 10*3/uL (ref 0.1–1.0)
Monocytes Relative: 9.5 % (ref 3.0–12.0)
Neutro Abs: 4.2 10*3/uL (ref 1.4–7.7)
Neutrophils Relative %: 65.7 % (ref 43.0–77.0)
Platelets: 284 10*3/uL (ref 150.0–400.0)
RBC: 3.04 Mil/uL — ABNORMAL LOW (ref 4.22–5.81)
RDW: 15.9 % — ABNORMAL HIGH (ref 11.5–15.5)
WBC: 6.3 10*3/uL (ref 4.0–10.5)

## 2021-10-05 LAB — MAGNESIUM: Magnesium: 1.5 mg/dL (ref 1.5–2.5)

## 2021-10-05 LAB — 5 HIAA, QUANTITATIVE, URINE, 24 HOUR
5-HIAA, Ur: 0.8 mg/L
5-HIAA,Quant.,24 Hr Urine: 1.2 mg/24 hr (ref 0.0–14.9)
Total Volume: 1500

## 2021-10-05 LAB — ZINC: Zinc: 52 ug/dL (ref 44–115)

## 2021-10-05 LAB — MITOCHONDRIAL ANTIBODIES: Mitochondrial M2 Ab, IgG: <20 Units (ref 0.0–20.0)

## 2021-10-05 LAB — SURGICAL PATHOLOGY

## 2021-10-05 MED ORDER — VITAMIN B-12 1000 MCG PO TABS: 1000.0000 ug | ORAL_TABLET | Freq: Every day | ORAL | Status: DC

## 2021-10-05 MED ORDER — FLUTICASONE-SALMETEROL 250-50 MCG/ACT IN AEPB
1.0000 | INHALATION_SPRAY | Freq: Two times a day (BID) | RESPIRATORY_TRACT | 5 refills | Status: DC
Start: 2021-10-05 — End: 2022-07-25

## 2021-10-05 MED ORDER — VITAMIN D (ERGOCALCIFEROL) 1.25 MG (50000 UNIT) PO CAPS: 50000.0000 [IU] | ORAL_CAPSULE | ORAL | 0 refills | Status: DC

## 2021-10-05 NOTE — Patient Instructions (Addendum)
Please review the medication list and take the medications as recommended.   You will run out of magnesium and  potassium tablets soon.  A prescription was sent for a medication called pantoprazole, take it twice daily on an empty stomach.    Check the  blood pressure regularly BP GOAL is between 110/65 and  135/85. If it is consistently higher or lower, let me know      GO TO THE LAB : Get the blood work     [place], [place] back for   a checkup in 4 weeks

## 2021-10-05 NOTE — Assessment & Plan Note (Signed)
Electrolyte disturbances: Admitted with hypokalemia, hypocalcemia and hypomagnesemia.  Hospital team felt the issue was due to chronic diarrhea.  Previously I thought this could be renal issue such as RTA.   Currently on potassium and magnesium supplements.  Check CMP, magnesium, ionized calcium, CBC.  Further advised with results. Chronic diarrhea: He was having several episodes of watery stools a day, at the hospital saw GI, had a  EGD and colonoscopy. BX pending. They are ruling out Zollinger-Ellison drome syndrome. Was discharged on PPI twice daily, the patient is not sure if he is taking it, recommend to do. I noticed that the chromogranin A was elevated with a normal gastrin. Some labs are pending, further advised per GI HTN, increased creatinine: At the hospital, he received IV fluids, creatinine improved.  They stopped amlodipine and metoprolol, BP remains within normal. Plan: Monitor BPs, restart medications if needed. Vitamin D deficiency: Dx at the hospital, was RX vit D by the hospital team, rec to start supplements B12 deficiency: Recommend B12 supplements RTC 4 weeks

## 2021-10-05 NOTE — Progress Notes (Signed)
LMOM for [facility] on provider line- okay to cancel Rx for ergocalciferol.

## 2021-10-05 NOTE — Progress Notes (Signed)
Subjective:    Patient ID: Richard Gates, male    DOB: May 23, 1960, 61 y.o.   MRN: [id number]  DOS:  10/05/2021 Type of visit - description: TCM  Admitted to the hospital 09/30/2021, discharged 10/03/2021. Was admitted after he was found to have hypokalemia, Hypocalcemia and low magnesium. Above happened in the context or diarrhea for several months although it improved after PPIs were discontinued. Symptoms including poor appetite, nausea as well.   Discharge summary reviewed:  Chronic diarrhea: Saw GI, had a EGD and colonoscopy, + esophagitis, gastritis and duodenitis.  Colonoscopy showed diverticulitis. DDX included  Zollinger-Ellison syndrome. Was recommended PPIs twice daily, also fluconazole x2 weeks for Candida esophagitis.  Electrolyte disturbance: Electrolytes  were replaced.  At the time of discharge potassium, calcium and magnesium remained low but improved.  Elevated creatinine: Improved    Review of Systems Since he left the hospital is doing well. Appetite is good, good tolerance. No nausea or vomiting. Diarrhea was watery, multiple episodes a day prior to admission, in the last 2 days he had 2 BMs, with more formed stools.      Past Medical History:  Diagnosis Date   COPD (chronic obstructive pulmonary disease) ([place])    Coronary artery calcification seen on CAT scan 08/2019   Coronary calcium score 103; short LM with<25% mixed calcific plaque (minimal); aortic atherosclerosis with normal size.  No dissection.  No aortic valve calcification   GERD (gastroesophageal reflux disease)    Hyperlipidemia    Hypertension     Past Surgical History:  Procedure Laterality Date   APPENDECTOMY     BIOPSY  10/02/2021   Procedure: BIOPSY;  Surgeon: Jerene Bears, MD;  Location: [facility] ENDOSCOPY;  Service: Gastroenterology;;   COLONOSCOPY  08/24/2016   COLONOSCOPY WITH PROPOFOL N/A 10/02/2021   Procedure: COLONOSCOPY WITH PROPOFOL;  Surgeon: Jerene Bears, MD;  Location: [facility];  Service: Gastroenterology;  Laterality: N/A;   ESOPHAGOGASTRODUODENOSCOPY (EGD) WITH PROPOFOL N/A 10/02/2021   Procedure: ESOPHAGOGASTRODUODENOSCOPY (EGD) WITH PROPOFOL;  Surgeon: Jerene Bears, MD;  Location: [facility] ENDOSCOPY;  Service: Gastroenterology;  Laterality: N/A;   POLYPECTOMY     TRANSTHORACIC ECHOCARDIOGRAM  04/2018   EF 65-70% (vigorous). No RWMA.  Gr 1 DD.  Normal valves.  (In setting of COPD exacerbation)    Current Outpatient Medications  Medication Instructions   albuterol (VENTOLIN HFA) 108 (90 Base) MCG/ACT inhaler INHALE 2 PUFFS INTO THE LUNGS EVERY 6 HOURS AS NEEDED FOR WHEEZING OR SHORTNESS OF BREATH   diphenoxylate-atropine (LOMOTIL) 2.5-0.025 MG tablet 1 tablet, Oral, 4 times daily PRN   feeding supplement (ENSURE ENLIVE / ENSURE PLUS) LIQD 237 mLs, Oral, 3 times daily between meals   fluticasone-salmeterol (ADVAIR) 250-50 MCG/ACT AEPB 1 puff, Inhalation, 2 times daily   Magnesium Oxide 400 mg, Oral, Daily   pantoprazole (PROTONIX) 40 mg, Oral, 2 times daily before meals   potassium chloride SA (KLOR-CON M) 20 MEQ tablet 40 mEq, Oral, Daily   Vitamin D3 (VITAMIN D) 3,000 Units, Oral, Daily       Objective:   Physical Exam BP 120/74   Pulse 81   Temp 98 F (36.7 C) (Oral)   Resp 18   Ht '5\' 6"'$  (1.676 m)   Wt 154 lb 4 oz (70 kg)   SpO2 98%   BMI 24.90 kg/m  General:   Well developed, NAD, BMI noted.  HEENT:  Normocephalic . Face symmetric, atraumatic Lungs:  CTA B Normal respiratory effort, no intercostal retractions,  no accessory muscle use. Heart: RRR,  no murmur.  Abdomen:  Not distended, soft, non-tender. No rebound or rigidity.   Skin: Not pale. Not jaundice Lower extremities: no pretibial edema bilaterally  Neurologic:  alert & oriented X3.  Speech normal, gait appropriate for age and unassisted Psych--  Cognition and judgment appear intact.  Cooperative with normal attention span and concentration.  Behavior appropriate. No anxious  or depressed appearing.     Assessment     ASSESSMENT Hyperglycemia HTN GERD Hyperlipidemia, high TG Allergies , nasal  COPD, PFTs 2013 showed ratio of 58, FEV1 of 1.78- 52% without significant bronchodilator response, TLC was 77% with DLCO 78% Smoker: quit 2020 Aortic sclerosis per CT 05-2020 Coronary CTA, Ca+ Co score 103.0, L main artery, saw cards, probably still a low risk situation.  Plan is CV RF,  ASA "no clear data suggest benefit" per cardiology note 11/08/2019 DVT L leg dx  05-31-2021  PLAN: Electrolyte disturbances: Admitted with hypokalemia, hypocalcemia and hypomagnesemia.  Hospital team felt the issue was due to chronic diarrhea.  Previously I thought this could be renal issue such as RTA.   Currently on potassium and magnesium supplements.  Check CMP, magnesium, ionized calcium, CBC.  Further advised with results. Chronic diarrhea: He was having several episodes of watery stools a day, at the hospital saw GI, had a  EGD and colonoscopy. BX pending. They are ruling out Zollinger-Ellison drome syndrome. Was discharged on PPI twice daily, the patient is not sure if he is taking it, recommend to do. I noticed that the chromogranin A was elevated with a normal gastrin. Some labs are pending, further advised per GI HTN, increased creatinine: At the hospital, he received IV fluids, creatinine improved.  They stopped amlodipine and metoprolol, BP remains within normal. Plan: Monitor BPs, restart medications if needed. Vitamin D deficiency: Dx at the hospital, was RX vit D by the hospital team, rec to start supplements B12 deficiency: Recommend B12 supplements RTC 4 weeks

## 2021-10-06 LAB — CALCIUM, IONIZED: Calcium, Ion: 4.9 mg/dL (ref 4.7–5.5)

## 2021-10-06 LAB — ANTI-SMOOTH MUSCLE ANTIBODY, IGG: F-Actin IgG: 6 Units (ref 0–19)

## 2021-10-12 LAB — CALPROTECTIN, FECAL: Calprotectin, Fecal: 14 ug/g (ref 0–120)

## 2021-10-19 ENCOUNTER — Other Ambulatory Visit: Payer: Self-pay

## 2021-10-19 MED ORDER — ALBUTEROL SULFATE HFA 108 (90 BASE) MCG/ACT IN AERS: 2.0000 | INHALATION_SPRAY | Freq: Four times a day (QID) | RESPIRATORY_TRACT | 5 refills | Status: DC | PRN

## 2021-11-01 ENCOUNTER — Ambulatory Visit: Payer: 59 | Admitting: Nurse Practitioner

## 2021-11-03 ENCOUNTER — Ambulatory Visit (INDEPENDENT_AMBULATORY_CARE_PROVIDER_SITE_OTHER): Payer: 59 | Admitting: Internal Medicine

## 2021-11-03 VITALS — BP 110/60 | HR 111 | Temp 98.1°F | Resp 18 | Ht 66.0 in | Wt 145.8 lb

## 2021-11-03 DIAGNOSIS — E878 Other disorders of electrolyte and fluid balance, not elsewhere classified: Secondary | ICD-10-CM | POA: Diagnosis not present

## 2021-11-03 DIAGNOSIS — R634 Abnormal weight loss: Secondary | ICD-10-CM | POA: Diagnosis not present

## 2021-11-03 NOTE — Progress Notes (Unsigned)
Subjective:    Patient ID: Richard Gates, male    DOB: 1961/03/27, 61 y.o.   MRN: [id number]  DOS:  11/03/2021 Type of visit - description: Follow-up  Since the last visit, he is overall feeling better. Diarrhea has stopped except for small episode earlier this week that responded to Imodium. Ambulatory BPs are actually within normal.  Still complaining of lack of taste and somewhat lack of appetite.  Wt Readings from Last 3 Encounters:  11/03/21 145 lb 12.8 oz (66.1 kg)  10/05/21 154 lb 4 oz (70 kg)  09/30/21 145 lb 15.1 oz (66.2 kg)     Review of Systems See above   Past Medical History:  Diagnosis Date   COPD (chronic obstructive pulmonary disease) ([facility])    Coronary artery calcification seen on CAT scan 08/2019   Coronary calcium score 103; short LM with<25% mixed calcific plaque (minimal); aortic atherosclerosis with normal size.  No dissection.  No aortic valve calcification   GERD (gastroesophageal reflux disease)    Hyperlipidemia    Hypertension     Past Surgical History:  Procedure Laterality Date   APPENDECTOMY     BIOPSY  10/02/2021   Procedure: BIOPSY;  Surgeon: Jerene Bears, MD;  Location: [facility] ENDOSCOPY;  Service: Gastroenterology;;   COLONOSCOPY  08/24/2016   COLONOSCOPY WITH PROPOFOL N/A 10/02/2021   Procedure: COLONOSCOPY WITH PROPOFOL;  Surgeon: Jerene Bears, MD;  Location: [facility];  Service: Gastroenterology;  Laterality: N/A;   ESOPHAGOGASTRODUODENOSCOPY (EGD) WITH PROPOFOL N/A 10/02/2021   Procedure: ESOPHAGOGASTRODUODENOSCOPY (EGD) WITH PROPOFOL;  Surgeon: Jerene Bears, MD;  [facility]COPY;  Service: Gastroenterology;  Laterality: N/A;   POLYPECTOMY     TRANSTHORACIC ECHOCARDIOGRAM  04/2018   EF 65-70% (vigorous). No RWMA.  Gr 1 DD.  Normal valves.  (In setting of COPD exacerbation)    Current Outpatient Medications  Medication Instructions   albuterol (VENTOLIN HFA) 108 (90 Base) MCG/ACT inhaler 2 puffs, Inhalation, Every 6 hours PRN    Eliquis 5 mg, Oral, 2 times daily   fluticasone-salmeterol (ADVAIR) 250-50 MCG/ACT AEPB 1 puff, Inhalation, 2 times daily   pantoprazole (PROTONIX) 40 mg, Oral, 2 times daily before meals   vitamin B-12 (CYANOCOBALAMIN) 1,000 mcg, Oral, Daily   Vitamin D3 (VITAMIN D) 3,000 Units, Oral, Daily       Objective:   Physical Exam BP 110/60 (BP Location: Left Arm, Patient Position: Sitting, Cuff Size: Normal)   Pulse (!) 111   Temp 98.1 F (36.7 C) (Oral)   Resp 18   Ht '5\' 6"'$  (1.676 m)   Wt 145 lb 12.8 oz (66.1 kg)   SpO2 94%   BMI 23.53 kg/m  General:   Well developed, NAD, BMI noted. HEENT:  Normocephalic . Face symmetric, atraumatic Lungs:  CTA B Normal respiratory effort, no intercostal retractions, no accessory muscle use. Heart: RRR,  no murmur. Abdomen: Soft, nontender Lower extremities: no pretibial edema bilaterally  Skin: Not pale. Not jaundice Neurologic:  alert & oriented X3.  Speech normal, gait appropriate for age and unassisted Psych--  Cognition and judgment appear intact.  Cooperative with normal attention span and concentration.  Behavior appropriate. No anxious or depressed appearing.      Assessment     ASSESSMENT Hyperglycemia HTN GERD Hyperlipidemia, high TG Allergies , nasal  COPD, PFTs 2013 showed ratio of 58, FEV1 of 1.78- 52% without significant bronchodilator response, TLC was 77% with DLCO 78% Smoker: quit 2020 Aortic sclerosis per CT 05-2020 Coronary CTA,  Ca+ Co score 103.0, L main artery, saw cards, probably still a low risk situation.  Plan is CV RF,  ASA "no clear data suggest benefit" per cardiology note 11/08/2019 DVT L leg dx  05-31-2021  PLAN: Electrolyte disturbance: See last visit, had hypokalemia, hypocalcemia and hypomagnesemia.  Labs came back okay.  He is currently taking no electrolyte tablets however he does drink Pedialyte or Gatorade frequently. Plan: Check BMP and magnesium level. Chronic diarrhea: Patient had a GI  appointment, he had to cancel it, recommend to reschedule.  Fortunately, the diarrhea is much improved. HTN: Currently on no medicines, reports ambulatory BPs are great.No change. Vitamin D deficiency: Taking supplements.  Recheck on RTC B12 deficiency: On supplements. Weight loss: Some weight loss noted, overall he looks better and stronger than previous visits.  He admits to poor appetite.  Check a TSH and reassess on RTC. RTC 3 months

## 2021-11-03 NOTE — Patient Instructions (Signed)
Please come back at your earliest convenience for blood work.  Make an appointment   [place], [place] back for a checkup in 3 months

## 2021-11-04 ENCOUNTER — Ambulatory Visit: Payer: 59 | Admitting: Nurse Practitioner

## 2021-11-04 NOTE — Assessment & Plan Note (Signed)
Electrolyte disturbance: See last visit, had hypokalemia, hypocalcemia and hypomagnesemia.  Labs came back okay.  He is currently taking no electrolyte tablets however he does drink Pedialyte or Gatorade frequently. Plan: Check BMP and magnesium level. Chronic diarrhea: Patient had a GI appointment, he had to cancel it, recommend to reschedule.  Fortunately, the diarrhea is much improved. HTN: Currently on no medicines, reports ambulatory BPs are great.No change. Vitamin D deficiency: Taking supplements.  Recheck on RTC B12 deficiency: On supplements. Weight loss: Some weight loss noted, overall he looks better and stronger than previous visits.  He admits to poor appetite.  Check a TSH and reassess on RTC. RTC 3 months

## 2021-11-05 ENCOUNTER — Other Ambulatory Visit (INDEPENDENT_AMBULATORY_CARE_PROVIDER_SITE_OTHER): Payer: 59

## 2021-11-05 DIAGNOSIS — E878 Other disorders of electrolyte and fluid balance, not elsewhere classified: Secondary | ICD-10-CM | POA: Diagnosis not present

## 2021-11-05 DIAGNOSIS — R634 Abnormal weight loss: Secondary | ICD-10-CM

## 2021-11-05 NOTE — Addendum Note (Signed)
Addended by: Manuela Schwartz on: 11/05/2021 03:08 PM   Modules accepted: Orders

## 2021-11-06 ENCOUNTER — Other Ambulatory Visit: Payer: Self-pay

## 2021-11-06 ENCOUNTER — Inpatient Hospital Stay (HOSPITAL_COMMUNITY)
Admission: EM | Admit: 2021-11-06 | Discharge: 2021-11-09 | DRG: 392 | Disposition: A | Discharge status: Home / Self Care | Payer: 59 | Attending: Family Medicine | Admitting: Family Medicine

## 2021-11-06 ENCOUNTER — Telehealth: Payer: Self-pay | Admitting: Family Medicine

## 2021-11-06 ENCOUNTER — Encounter (HOSPITAL_COMMUNITY): Payer: Self-pay

## 2021-11-06 DIAGNOSIS — I2510 Atherosclerotic heart disease of native coronary artery without angina pectoris: Secondary | ICD-10-CM | POA: Diagnosis present

## 2021-11-06 DIAGNOSIS — Z7951 Long term (current) use of inhaled steroids: Secondary | ICD-10-CM | POA: Diagnosis not present

## 2021-11-06 DIAGNOSIS — Z79899 Other long term (current) drug therapy: Secondary | ICD-10-CM

## 2021-11-06 DIAGNOSIS — K529 Noninfective gastroenteritis and colitis, unspecified: Secondary | ICD-10-CM | POA: Diagnosis not present

## 2021-11-06 DIAGNOSIS — D638 Anemia in other chronic diseases classified elsewhere: Secondary | ICD-10-CM | POA: Diagnosis present

## 2021-11-06 DIAGNOSIS — Z91148 Patient's other noncompliance with medication regimen for other reason: Secondary | ICD-10-CM | POA: Diagnosis not present

## 2021-11-06 DIAGNOSIS — N179 Acute kidney failure, unspecified: Secondary | ICD-10-CM | POA: Diagnosis not present

## 2021-11-06 DIAGNOSIS — E785 Hyperlipidemia, unspecified: Secondary | ICD-10-CM | POA: Diagnosis present

## 2021-11-06 DIAGNOSIS — Z720 Tobacco use: Secondary | ICD-10-CM | POA: Diagnosis present

## 2021-11-06 DIAGNOSIS — R9431 Abnormal electrocardiogram [ECG] [EKG]: Secondary | ICD-10-CM | POA: Diagnosis present

## 2021-11-06 DIAGNOSIS — I5032 Chronic diastolic (congestive) heart failure: Secondary | ICD-10-CM | POA: Diagnosis present

## 2021-11-06 DIAGNOSIS — I110 Hypertensive heart disease with heart failure: Secondary | ICD-10-CM | POA: Diagnosis present

## 2021-11-06 DIAGNOSIS — J449 Chronic obstructive pulmonary disease, unspecified: Secondary | ICD-10-CM | POA: Diagnosis not present

## 2021-11-06 DIAGNOSIS — E538 Deficiency of other specified B group vitamins: Secondary | ICD-10-CM

## 2021-11-06 DIAGNOSIS — Z86718 Personal history of other venous thrombosis and embolism: Secondary | ICD-10-CM

## 2021-11-06 DIAGNOSIS — Z8249 Family history of ischemic heart disease and other diseases of the circulatory system: Secondary | ICD-10-CM | POA: Diagnosis not present

## 2021-11-06 DIAGNOSIS — Z87891 Personal history of nicotine dependence: Secondary | ICD-10-CM | POA: Diagnosis not present

## 2021-11-06 DIAGNOSIS — E559 Vitamin D deficiency, unspecified: Secondary | ICD-10-CM

## 2021-11-06 DIAGNOSIS — I10 Essential (primary) hypertension: Secondary | ICD-10-CM | POA: Diagnosis not present

## 2021-11-06 DIAGNOSIS — D649 Anemia, unspecified: Secondary | ICD-10-CM | POA: Diagnosis not present

## 2021-11-06 DIAGNOSIS — E876 Hypokalemia: Secondary | ICD-10-CM | POA: Diagnosis present

## 2021-11-06 LAB — IRON AND TIBC
Iron: 108 ug/dL (ref 45–182)
Saturation Ratios: 49 % — ABNORMAL HIGH (ref 17.9–39.5)
TIBC: 220 ug/dL — ABNORMAL LOW (ref 250–450)
UIBC: 112 ug/dL

## 2021-11-06 LAB — BASIC METABOLIC PANEL
Anion gap: 14 (ref 5–15)
BUN/Creatinine Ratio: 9 (calc) (ref 6–22)
BUN: 14 mg/dL (ref 7–25)
BUN: 13 mg/dL (ref 8–23)
CO2: 28 mmol/L (ref 20–32)
CO2: 30 mmol/L (ref 22–32)
Calcium: 7.1 mg/dL — ABNORMAL LOW (ref 8.6–10.3)
Calcium: 6.6 mg/dL — ABNORMAL LOW (ref 8.9–10.3)
Chloride: 92 mmol/L — ABNORMAL LOW (ref 98–110)
Chloride: 93 mmol/L — ABNORMAL LOW (ref 98–111)
Creat: 1.61 mg/dL — ABNORMAL HIGH (ref 0.70–1.35)
Creatinine, Ser: 1.38 mg/dL — ABNORMAL HIGH (ref 0.61–1.24)
GFR, Estimated: 58 mL/min — ABNORMAL LOW (ref >60)
Glucose, Bld: 102 mg/dL — ABNORMAL HIGH (ref 65–99)
Glucose, Bld: 106 mg/dL — ABNORMAL HIGH (ref 70–99)
Potassium: 3.2 mmol/L — ABNORMAL LOW (ref 3.5–5.3)
Potassium: 2.5 mmol/L — CL (ref 3.5–5.1)
Sodium: 138 mmol/L (ref 135–146)
Sodium: 137 mmol/L (ref 135–145)

## 2021-11-06 LAB — BLOOD GAS, VENOUS
Acid-Base Excess: 7.5 mmol/L — ABNORMAL HIGH (ref 0.0–2.0)
Bicarbonate: 32.6 mmol/L — ABNORMAL HIGH (ref 20.0–28.0)
O2 Saturation: 37.3 %
Patient temperature: 37.1
pCO2, Ven: 48 mmHg (ref 44–60)
pH, Ven: 7.44 — ABNORMAL HIGH (ref 7.25–7.43)
pO2, Ven: <31 mmHg — CL (ref 32–45)

## 2021-11-06 LAB — URINALYSIS, COMPLETE (UACMP) WITH MICROSCOPIC
Bacteria, UA: NONE SEEN
Bilirubin Urine: NEGATIVE
Glucose, UA: NEGATIVE mg/dL
Hgb urine dipstick: NEGATIVE
Ketones, ur: NEGATIVE mg/dL
Leukocytes,Ua: NEGATIVE
Nitrite: NEGATIVE
Protein, ur: NEGATIVE mg/dL
Specific Gravity, Urine: 1.006 (ref 1.005–1.030)
pH: 6 (ref 5.0–8.0)

## 2021-11-06 LAB — CBC
HCT: 30.7 % — ABNORMAL LOW (ref 39.0–52.0)
Hemoglobin: 10.7 g/dL — ABNORMAL LOW (ref 13.0–17.0)
MCH: 33.9 pg (ref 26.0–34.0)
MCHC: 34.9 g/dL (ref 30.0–36.0)
MCV: 97.2 fL (ref 80.0–100.0)
Platelets: 325 10*3/uL (ref 150–400)
RBC: 3.16 MIL/uL — ABNORMAL LOW (ref 4.22–5.81)
RDW: 14.2 % (ref 11.5–15.5)
WBC: 3.3 10*3/uL — ABNORMAL LOW (ref 4.0–10.5)
nRBC: 0 % (ref 0.0–0.2)

## 2021-11-06 LAB — RETICULOCYTES
Immature Retic Fract: 16.3 % — ABNORMAL HIGH (ref 2.3–15.9)
RBC.: 3.07 MIL/uL — ABNORMAL LOW (ref 4.22–5.81)
Retic Count, Absolute: 39.3 10*3/uL (ref 19.0–186.0)
Retic Ct Pct: 1.3 % (ref 0.4–3.1)

## 2021-11-06 LAB — COMPREHENSIVE METABOLIC PANEL
ALT: 11 U/L (ref 0–44)
AST: 20 U/L (ref 15–41)
Albumin: 3.3 g/dL — ABNORMAL LOW (ref 3.5–5.0)
Alkaline Phosphatase: 85 U/L (ref 38–126)
Anion gap: 14 (ref 5–15)
BUN: 17 mg/dL (ref 8–23)
CO2: 30 mmol/L (ref 22–32)
Calcium: 6.9 mg/dL — ABNORMAL LOW (ref 8.9–10.3)
Chloride: 96 mmol/L — ABNORMAL LOW (ref 98–111)
Creatinine, Ser: 1.47 mg/dL — ABNORMAL HIGH (ref 0.61–1.24)
GFR, Estimated: 54 mL/min — ABNORMAL LOW (ref >60)
Glucose, Bld: 101 mg/dL — ABNORMAL HIGH (ref 70–99)
Potassium: 3.2 mmol/L — ABNORMAL LOW (ref 3.5–5.1)
Sodium: 140 mmol/L (ref 135–145)
Total Bilirubin: 0.5 mg/dL (ref 0.3–1.2)
Total Protein: 6.7 g/dL (ref 6.5–8.1)

## 2021-11-06 LAB — FOLATE: Folate: 1.8 ng/mL — ABNORMAL LOW (ref >5.9)

## 2021-11-06 LAB — VITAMIN B12: Vitamin B-12: 222 pg/mL (ref 180–914)

## 2021-11-06 LAB — MAGNESIUM
Magnesium: 0.5 mg/dL — CL (ref 1.5–2.5)
Magnesium: 0.8 mg/dL — CL (ref 1.7–2.4)
Magnesium: 1.9 mg/dL (ref 1.7–2.4)

## 2021-11-06 LAB — FERRITIN: Ferritin: 1361 ng/mL — ABNORMAL HIGH (ref 24–336)

## 2021-11-06 LAB — SODIUM, URINE, RANDOM: Sodium, Ur: 25 mmol/L

## 2021-11-06 LAB — CK: Total CK: 219 U/L (ref 49–397)

## 2021-11-06 LAB — PHOSPHORUS: Phosphorus: 2 mg/dL — ABNORMAL LOW (ref 2.5–4.6)

## 2021-11-06 LAB — CREATININE, URINE, RANDOM: Creatinine, Urine: 87 mg/dL

## 2021-11-06 LAB — TSH: TSH: 1.88 mIU/L (ref 0.40–4.50)

## 2021-11-06 MED ORDER — HYDROCODONE-ACETAMINOPHEN 5-325 MG PO TABS: 1.0000 | ORAL_TABLET | ORAL | Status: DC | PRN

## 2021-11-06 MED ORDER — FOLIC ACID 1 MG PO TABS
1.0000 mg | ORAL_TABLET | Freq: Every day | ORAL | Status: DC
  Administered 2021-11-07 – 2021-11-09 (×3): 1 mg via ORAL
  Filled 2021-11-06 (×3): qty 1

## 2021-11-06 MED ORDER — MAGNESIUM SULFATE 2 GM/50ML IV SOLN
2.0000 g | Freq: Once | INTRAVENOUS | Status: AC
  Administered 2021-11-06: 2 g via INTRAVENOUS
  Filled 2021-11-06: qty 50

## 2021-11-06 MED ORDER — POTASSIUM CHLORIDE CRYS ER 20 MEQ PO TBCR
40.0000 meq | EXTENDED_RELEASE_TABLET | Freq: Once | ORAL | Status: AC
  Administered 2021-11-06: 40 meq via ORAL
  Filled 2021-11-06: qty 2

## 2021-11-06 MED ORDER — MAGNESIUM OXIDE -MG SUPPLEMENT 400 (240 MG) MG PO TABS
400.0000 mg | ORAL_TABLET | Freq: Every day | ORAL | Status: DC
  Administered 2021-11-06 – 2021-11-08 (×3): 400 mg via ORAL
  Filled 2021-11-06 (×3): qty 1

## 2021-11-06 MED ORDER — POTASSIUM CHLORIDE 10 MEQ/100ML IV SOLN
10.0000 meq | INTRAVENOUS | Status: AC
  Administered 2021-11-06 – 2021-11-07 (×2): 10 meq via INTRAVENOUS
  Filled 2021-11-06: qty 100

## 2021-11-06 MED ORDER — ACETAMINOPHEN 650 MG RE SUPP: 650.0000 mg | Freq: Four times a day (QID) | RECTAL | Status: DC | PRN

## 2021-11-06 MED ORDER — ALBUTEROL SULFATE HFA 108 (90 BASE) MCG/ACT IN AERS: 2.0000 | INHALATION_SPRAY | Freq: Four times a day (QID) | RESPIRATORY_TRACT | Status: DC | PRN

## 2021-11-06 MED ORDER — ACETAMINOPHEN 325 MG PO TABS: 650.0000 mg | ORAL_TABLET | Freq: Four times a day (QID) | ORAL | Status: DC | PRN

## 2021-11-06 MED ORDER — CALCIUM GLUCONATE-NACL 1-0.675 GM/50ML-% IV SOLN
1.0000 g | Freq: Once | INTRAVENOUS | Status: AC
  Administered 2021-11-06: 1000 mg via INTRAVENOUS
  Filled 2021-11-06: qty 50

## 2021-11-06 MED ORDER — POTASSIUM CHLORIDE 10 MEQ/100ML IV SOLN
10.0000 meq | INTRAVENOUS | Status: AC
  Administered 2021-11-06 (×3): 10 meq via INTRAVENOUS
  Filled 2021-11-06 (×3): qty 100

## 2021-11-06 MED ORDER — SODIUM CHLORIDE 0.9 % IV SOLN: INTRAVENOUS | Status: AC

## 2021-11-06 MED ORDER — ALBUTEROL SULFATE (2.5 MG/3ML) 0.083% IN NEBU: 2.5000 mg | INHALATION_SOLUTION | Freq: Four times a day (QID) | RESPIRATORY_TRACT | Status: DC | PRN

## 2021-11-06 NOTE — Assessment & Plan Note (Signed)
Now improving continue to monitor need to have follow-up with GI as an outpatient

## 2021-11-06 NOTE — ED Provider Triage Note (Signed)
Emergency Medicine Provider Triage Evaluation Note  Richard Gates , a 61 y.o. male  was evaluated in triage.  Pt presents for abnormal lab work. Had lab work at PCP yesterday after being discharged from the hospital last week and was noted to have magnesium level of 0.5. Not complaining of any symptoms.   Review of Systems  As above  Physical Exam  BP 119/77   Pulse 98   Temp 98.1 F (36.7 C) (Oral)   Resp 18   SpO2 96%  Gen:   Awake, no distress   Resp:  Normal effort  MSK:   Moves extremities without difficulty  Other:    Medical Decision Making  Medically screening exam initiated at 3:10 PM.  Appropriate orders placed.  Richard Gates was informed that the remainder of the evaluation will be completed by another provider, this initial triage assessment does not replace that evaluation, and the importance of remaining in the ED until their evaluation is complete.     Richard Gates T, PA-C 11/06/21 1512

## 2021-11-06 NOTE — Assessment & Plan Note (Signed)
Chronic stable continue albuterol as needed and Advair

## 2021-11-06 NOTE — Assessment & Plan Note (Signed)
Not taking any blood pressure medications at home continue to follow

## 2021-11-06 NOTE — Telephone Encounter (Signed)
Received a second call from the call service to report this patients critical magnesium level. I advised that I had already been called about this and already spoken with the patient.

## 2021-11-06 NOTE — H&P (Signed)
Richard Gates QIH:[id number] DOB: June 14, 1960 DOA: 11/06/2021   PCP: Colon Branch, MD   Outpatient Specialists:     GI  ( [facility]) Pyrtle, Lajuan Lines, MD    Patient arrived to ER on 11/06/21 at 1445 Referred by Attending Toy Baker, MD   Patient coming from:    home Lives  With family    Chief Complaint:   Chief Complaint  Patient presents with   Abnormal Lab    HPI: Richard Gates is a 61 y.o. male with medical history significant of HTN HLD COPD chronic diarrhea, anemia    Presented with abnormal labs Pt with hx of chronic diarrhea and electrolyte abnormalities Was called by PCP because his electrolytes were abnormal again. Patient was admitted in the beginning of June with chronic diarrhea for the past 6 months has been seen by GI work-up has included CTA which was unremarkable His PPI was discontinued for her to have may be diarrhea secondary to PPI and his diarrhea has improved But he was noted to have low potassium and was admitted his electrolytes took few days to replace and he was able to be discharged home During his hospital stay he was seen by [facility] he underwent EGD and colonoscopy noted to have esophagitis gastritis and duodenitis Suspicion of Zollinger-Ellison syndrome.  Gastrin level is pending.  At after discharge was supposed to follow GI  started on PPI twice daily, Lomotil 4 times daily as needed.  He was seen by his PCP his labs were checked and he was noted to again have abnormality of electrolytes and was sent to emergency department to have it repleted Patient was just seen by his primary care provider on 5 July at that time he stated he stopped taking his electrolyte replacement but has not had his GI follow-up.  His diarrhea has improved when his blood work was checked again it was noted to have significant hypokalemia and was told to go to emergency department   Quit smoking years ago Drinks beer on weekends not every day Takes imodium on  occasion Not on PPI anymore   Reports decreased appetite  Regarding pertinent Chronic problems:     Hyperlipidemia -  not on statins   Lipid Panel     Component Value Date/Time   CHOL 182 09/02/2020 1623   TRIG 387.0 (H) 09/02/2020 1623   HDL 80.00 09/02/2020 1623   CHOLHDL 2 09/02/2020 1623   VLDL 77.4 (H) 09/02/2020 1623   LDLCALC 68 04/12/2019 1553   LDLDIRECT 49.0 09/02/2020 1623      chronic CHF diastolic  - last echo  8756 showed grade 1 diastolic dysfunction   CAD  - Minimal non-obstuctive mixed LM disease, CADRADS = 1. Per CAT scan done 2021         COPD - not  followed by pulmonology   not  on baseline oxygen  Quit smoking 2020 on Advair        Chronic anemia - baseline hg Hemoglobin & Hematocrit  Recent Labs    10/03/21 0342 10/05/21 0942 11/06/21 1528  HGB 9.6* 10.4* 10.7*     While in ER:   Noted to have hypokalemia hypomagnesemia and hypocalcemia started to be replaced EKG showing prolonged QT interval   Following Medications were ordered in ER: Medications  potassium chloride 10 mEq in 100 mL IVPB (10 mEq Intravenous New Bag/Given 11/06/21 1818)  calcium gluconate 1 g/ 50 mL sodium chloride IVPB (1,000 mg Intravenous New  Bag/Given 11/06/21 1827)  magnesium sulfate IVPB 2 g 50 mL (0 g Intravenous Stopped 11/06/21 1833)  potassium chloride SA (KLOR-CON M) CR tablet 40 mEq (40 mEq Oral Given 11/06/21 1721)       ED Triage Vitals  Enc Vitals Group     BP 11/06/21 1501 119/77     Pulse Rate 11/06/21 1501 98     Resp 11/06/21 1501 18     Temp 11/06/21 1501 98.1 F (36.7 C)     Temp Source 11/06/21 1501 Oral     SpO2 11/06/21 1501 96 %     Weight 11/06/21 1721 145 lb (65.8 kg)     Height 11/06/21 1721 '5\' 6"'$  (1.676 m)     Head Circumference --      Peak Flow --      Pain Score 11/06/21 1722 0     Pain Loc --      Pain Edu? --      Excl. in [place]? --   TMAX(24)@     _________________________________________ Significant initial  Findings: Abnormal Labs  Reviewed  CBC - Abnormal; Notable for the following components:      Result Value   WBC 3.3 (*)    RBC 3.16 (*)    Hemoglobin 10.7 (*)    HCT 30.7 (*)    All other components within normal limits  BASIC METABOLIC PANEL - Abnormal; Notable for the following components:   Potassium 2.5 (*)    Chloride 93 (*)    Glucose, Bld 106 (*)    Creatinine, Ser 1.38 (*)    Calcium 6.6 (*)    GFR, Estimated 58 (*)    All other components within normal limits  MAGNESIUM - Abnormal; Notable for the following components:   Magnesium 0.8 (*)    All other components within normal limits      ECG: Ordered Personally reviewed and interpreted by me showing: HR : 94 RhythmSinus rhythm new Prolonged QT interval QTC 512     The recent clinical data is shown below. Vitals:   11/06/21 1501 11/06/21 1713 11/06/21 1721  BP: 119/77 117/65   Pulse: 98 88   Resp: 18 16   Temp: 98.1 F (36.7 C)    TempSrc: Oral    SpO2: 96% 99%   Weight:   65.8 kg  Height:   '5\' 6"'$  (1.676 m)       WBC     Component Value Date/Time   WBC 3.3 (L) 11/06/2021 1528   LYMPHSABS 1.3 10/05/2021 0942   MONOABS 0.6 10/05/2021 0942   EOSABS 0.2 10/05/2021 0942   BASOSABS 0.0 10/05/2021 0942      UA   no evidence of UTI      Urine analysis:    Component Value Date/Time   COLORURINE STRAW (A) 11/06/2021 2008   APPEARANCEUR CLEAR 11/06/2021 2008   LABSPEC 1.006 11/06/2021 2008   PHURINE 6.0 11/06/2021 2008   GLUCOSEU NEGATIVE 11/06/2021 2008   GLUCOSEU NEGATIVE 02/03/2021 1557   HGBUR NEGATIVE 11/06/2021 2008   [place] NEGATIVE 11/06/2021 2008   KETONESUR NEGATIVE 11/06/2021 2008   PROTEINUR NEGATIVE 11/06/2021 2008   UROBILINOGEN 0.2 02/03/2021 1557   NITRITE NEGATIVE 11/06/2021 2008   LEUKOCYTESUR NEGATIVE 11/06/2021 2008    Results for orders placed or performed during the hospital encounter of 09/30/21  C Difficile Quick Screen w PCR reflex     Status: None   Collection Time: 09/30/21  3:30 PM    Specimen: STOOL  Result Value Ref Range Status   C Diff antigen NEGATIVE NEGATIVE Final   C Diff toxin NEGATIVE NEGATIVE Final   C Diff interpretation No C. difficile detected.  Final    Comment: Performed at [facility], [place] [address] [place], [place] [address]  Gastrointestinal Panel by PCR , Stool     Status: None   Collection Time: 09/30/21  3:30 PM   Specimen: Stool  Result Value Ref Range Status   Campylobacter species NOT DETECTED NOT DETECTED Final   Plesimonas shigelloides NOT DETECTED NOT DETECTED Final   Salmonella species NOT DETECTED NOT DETECTED Final   Yersinia enterocolitica NOT DETECTED NOT DETECTED Final   Vibrio species NOT DETECTED NOT DETECTED Final   Vibrio cholerae NOT DETECTED NOT DETECTED Final   Enteroaggregative E coli (EAEC) NOT DETECTED NOT DETECTED Final   Enteropathogenic E coli (EPEC) NOT DETECTED NOT DETECTED Final   Enterotoxigenic E coli (ETEC) NOT DETECTED NOT DETECTED Final   Shiga like toxin producing E coli (STEC) NOT DETECTED NOT DETECTED Final   Shigella/Enteroinvasive E coli (EIEC) NOT DETECTED NOT DETECTED Final   Cryptosporidium NOT DETECTED NOT DETECTED Final   Cyclospora cayetanensis NOT DETECTED NOT DETECTED Final   Entamoeba histolytica NOT DETECTED NOT DETECTED Final   Giardia lamblia NOT DETECTED NOT DETECTED Final   Adenovirus F40/41 NOT DETECTED NOT DETECTED Final   Astrovirus NOT DETECTED NOT DETECTED Final   Norovirus GI/GII NOT DETECTED NOT DETECTED Final   Rotavirus A NOT DETECTED NOT DETECTED Final   Sapovirus (I, II, IV, and V) NOT DETECTED NOT DETECTED Final    Comment: Performed at [facility], [address] [place], [place] [address]  Calprotectin, Fecal     Status: None   Collection Time: 10/01/21 10:51 AM   Specimen: Stool  Result Value Ref Range Status   Calprotectin, Fecal 14 0 - 120 ug/g Final    Comment: (NOTE) Concentration     Interpretation   Follow-Up < 5 - 50 ug/g     Normal            None >50 -120 ug/g     Borderline       Re-evaluate in 4-6 weeks    >120 ug/g     Abnormal         Repeat as clinically                                   indicated Performed At: [facility] [employer] [place], [place] [id number] Rush Farmer MD SP:[id number]      _______________________________________________ Hospitalist was called for admission for   Acute kidney injury   Hypokalemia  Hypomagnesemia  Hypocalcemia  Prolonged QTC    The following Work up has been ordered so far:  Orders Placed This Encounter  Procedures   CBC   Basic metabolic panel   Magnesium   Urinalysis, Routine w reflex microscopic   Sodium, urine, random   Phosphorus   Cardiac monitoring   Cardiac monitoring   Consult to hospitalist   ED EKG   EKG 12-Lead   Admit to Inpatient (patient's expected length of stay will be greater than 2 midnights or inpatient only procedure)     OTHER Significant initial  Findings:  labs showing:    Recent Labs  Lab 11/05/21 1509 11/06/21 1528 11/06/21 1947  NA 138 137 140  K 3.2* 2.5* 3.2*  CO2 '28 30 30  '$ GLUCOSE 102* 106* 101*  BUN '14 13 17  '$ CREATININE 1.61* 1.38* 1.47*  CALCIUM 7.1* 6.6* 6.9*  MG 0.5* 0.8* 1.9  PHOS  --   --  2.0*    Cr  Up from baseline see below Lab Results  Component Value Date   CREATININE 1.47 (H) 11/06/2021   CREATININE 1.38 (H) 11/06/2021   CREATININE 1.61 (H) 11/05/2021    Recent Labs  Lab 11/06/21 1947  AST 20  ALT 11  ALKPHOS 85  BILITOT 0.5  PROT 6.7  ALBUMIN 3.3*   Lab Results  Component Value Date   CALCIUM 6.6 (L) 11/06/2021   PHOS 2.8 10/03/2021          Plt: Lab Results  Component Value Date   PLT 325 11/06/2021    Venous  Blood Gas result:  pH   Latest Reference Range & Units Most Recent  pH, Ven 7.25 - 7.43  7.44 (H) 11/06/21 20:08  pCO2, Ven 44 - 60 mmHg 48 11/06/21 20:08  pO2, Ven 32 - 45 mmHg <31 (LL) 11/06/21 20:08  (LL): Data is critically low (H): Data is abnormally  high      Recent Labs  Lab 11/06/21 1528  WBC 3.3*  HGB 10.7*  HCT 30.7*  MCV 97.2  PLT 325    HG/HCT  stable,       Component Value Date/Time   HGB 10.7 (L) 11/06/2021 1528   HGB 11.2 (L) 06/15/2021 1459   HCT 30.7 (L) 11/06/2021 1528   MCV 97.2 11/06/2021 1528     Cardiac Panel (last 3 results) Recent Labs    11/06/21 1947  CKTOTAL 219    .car BNP (last 3 results) Recent Labs    05/30/21 1900  BNP 59.6     DM  labs:  HbA1C: Recent Labs    02/16/21 1454  HGBA1C 5.8       CBG (last 3)  No results for input(s): "GLUCAP" in the last 72 hours.        Cultures:    Component Value Date/Time   SDES  05/30/2021 1947    URINE, CLEAN CATCH Performed at [facility], [address] [place], [place] [address]    [facility]  05/30/2021 1947    NONE Performed at [facility], [address] [place], [place] [address]    CULT  05/30/2021 1947    NO GROWTH Performed at [facility], [place] [address], [place], [place] [address]    REPTSTATUS 06/01/2021 FINAL 05/30/2021 1947     Radiological Exams on Admission: No results found. _______________________________________________________________________________________________________ Latest  Blood pressure 117/65, pulse 88, temperature 98.1 F (36.7 C), temperature source Oral, resp. rate 16, height '5\' 6"'$  (1.676 m), weight 65.8 kg, SpO2 99 %.   Vitals  labs and radiology finding personally reviewed  Review of Systems:    Pertinent positives include:  diarrhea 2 bm a day  Constitutional:  No weight loss, night sweats, Fevers, chills, fatigue, weight loss  HEENT:  No headaches, Difficulty swallowing,Tooth/dental problems,Sore throat,  No sneezing, itching, ear ache, nasal congestion, post nasal drip,  Cardio-vascular:  No chest pain, Orthopnea, PND, anasarca, dizziness, palpitations.no Bilateral lower extremity swelling  GI:  No heartburn, indigestion, abdominal pain,  nausea, vomiting, diarrhea, change in bowel habits, loss of appetite, melena, blood in stool, hematemesis Resp:  no shortness of breath at rest. No dyspnea on exertion, No excess mucus, no productive  cough, No non-productive cough, No coughing up of blood.No change in color of mucus.No wheezing. Skin:  no rash or lesions. No jaundice GU:  no dysuria, change in color of urine, no urgency or frequency. No straining to urinate.  No flank pain.  Musculoskeletal:  No joint pain or no joint swelling. No decreased range of motion. No back pain.  Psych:  No change in mood or affect. No depression or anxiety. No memory loss.  Neuro: no localizing neurological complaints, no tingling, no weakness, no double vision, no gait abnormality, no slurred speech, no confusion  All systems reviewed and apart from [place] all are negative _______________________________________________________________________________________________ Past Medical History:   Past Medical History:  Diagnosis Date   COPD (chronic obstructive pulmonary disease) ([place])    Coronary artery calcification seen on CAT scan 08/2019   Coronary calcium score 103; short LM with<25% mixed calcific plaque (minimal); aortic atherosclerosis with normal size.  No dissection.  No aortic valve calcification   GERD (gastroesophageal reflux disease)    Hyperlipidemia    Hypertension      Past Surgical History:  Procedure Laterality Date   APPENDECTOMY     BIOPSY  10/02/2021   Procedure: BIOPSY;  Surgeon: Jerene Bears, MD;  Location: [facility] ENDOSCOPY;  Service: Gastroenterology;;   COLONOSCOPY  08/24/2016   COLONOSCOPY WITH PROPOFOL N/A 10/02/2021   Procedure: COLONOSCOPY WITH PROPOFOL;  Surgeon: Jerene Bears, MD;  Location: [facility];  Service: Gastroenterology;  Laterality: N/A;   ESOPHAGOGASTRODUODENOSCOPY (EGD) WITH PROPOFOL N/A 10/02/2021   Procedure: ESOPHAGOGASTRODUODENOSCOPY (EGD) WITH PROPOFOL;  Surgeon: Jerene Bears, MD;  Location: [facility]  ENDOSCOPY;  Service: Gastroenterology;  Laterality: N/A;   POLYPECTOMY     TRANSTHORACIC ECHOCARDIOGRAM  04/2018   EF 65-70% (vigorous). No RWMA.  Gr 1 DD.  Normal valves.  (In setting of COPD exacerbation)    Social History:  Ambulatory   independently      reports that he quit smoking about 3 years ago. His smoking use included cigarettes. He has a 30.75 pack-year smoking history. He has never used smokeless tobacco. He reports current alcohol use. He reports that he does not currently use drugs after having used the following drugs: Marijuana.     Family History:   Family History  Problem Relation Age of Onset   Stroke Father        July 0086: complicated by hemmrhage -- lost eyesight, memory loss   Hypertension Other    Heart disease Paternal Uncle    Breast cancer Sister    Heart disease Paternal Aunt        surgery   Heart attack Maternal Uncle    Colon cancer Neg Hx    Prostate cancer Neg Hx    Colon polyps Neg Hx    Esophageal cancer Neg Hx    Stomach cancer Neg Hx    Rectal cancer Neg Hx    ______________________________________________________________________________________________ Allergies: No Known Allergies   Prior to Admission medications   Medication Sig Start Date End Date Taking? Authorizing Provider  albuterol (VENTOLIN HFA) 108 (90 Base) MCG/ACT inhaler Inhale 2 puffs into the lungs every 6 (six) hours as needed for wheezing or shortness of breath. 10/19/21  Yes Copland, Gay Filler, MD  cholecalciferol (VITAMIN D) 25 MCG tablet Take 3 tablets (3,000 Units total) by mouth daily. 10/04/21 01/02/22 Yes Dahal, Marlowe Aschoff, MD  fluticasone-salmeterol (ADVAIR) 250-50 MCG/ACT AEPB Inhale 1 puff into the lungs in the morning and at bedtime. 10/05/21  Yes Paz,  Alda Berthold, MD  magnesium 30 MG tablet Take 30 mg by mouth 2 (two) times daily.   Yes [provider]  pantoprazole (PROTONIX) 40 MG tablet Take 1 tablet (40 mg total) by mouth 2 (two) times daily before a  meal. Patient not taking: Reported on 11/06/2021 10/03/21 01/01/22  Terrilee Croak, MD  vitamin B-12 (CYANOCOBALAMIN) 1000 MCG tablet Take 1 tablet (1,000 mcg total) by mouth daily. Patient not taking: Reported on 11/06/2021 10/05/21   Colon Branch, MD    ___________________________________________________________________________________________________ Physical Exam:    11/06/2021    5:21 PM 11/06/2021    5:13 PM 11/06/2021    3:01 PM  Vitals with BMI  Height '5\' 6"'$     Weight 145 lbs    BMI 24.40    Systolic  102 725  Diastolic  65 77  Pulse  88 98     1. General:  in No  Acute distress    Chronically ill   2. Psychological: Alert and   Oriented 3. Head/ENT:   Dry Mucous Membranes                          Head Non traumatic, neck supple                          Poor Dentition 4. SKIN:  decreased Skin turgor,  Skin clean Dry and intact no rash 5. Heart: Regular rate and rhythm no  Murmur, no Rub or gallop 6. Lungs:  Clear to auscultation bilaterally, no wheezes or crackles   7. Abdomen: Soft,  non-tender, distended   obese  bowel sounds present 8. Lower extremities: no clubbing, cyanosis, no  edema 9. Neurologically Grossly intact, moving all 4 extremities equally   10. MSK: Normal range of motion    Chart has been reviewed  ______________________________________________________________________________________________  Assessment/Plan  61 y.o. male with medical history significant of HTN HLD COPD chronic diarrhea, anemia  Admitted for  Acute kidney injury   Hypokalemia  Hypomagnesemia  Hypocalcemia  Prolonged QTC    Present on Admission:  AKI (acute kidney injury) ([place])  Hyperlipidemia with target LDL less than 70  Chronic diarrhea  COPD mixed type ([place])  Hypertension  Hypocalcemia  Hypokalemia  Hypomagnesemia  Prolonged QT interval  Tobacco abuse  Anemia     Hyperlipidemia with target LDL less than 70 Patient not on statins at home we will have him follow-up with  primary care  AKI (acute kidney injury) ([place]) Obtain urine electrolytes gently rehydrate continue to follow Given recurrent electrolyte abnormalities will need to have follow-up with nephrology Discussed with Dr. Posey Pronto who will "facilitate a follow up and put details in chart on Monday" And recommends "order a 24hour urine collection for sodium, potassium, magnesium, calcium and creatinine while she's admitted"  Has been ordred   Chronic diarrhea Now improving continue to monitor need to have follow-up with GI as an outpatient  COPD mixed type ([place]) Chronic stable continue albuterol as needed and Advair  Hypertension Not taking any blood pressure medications at home continue to follow  Hypocalcemia Replace and continue to follow Monitor on telemetry  Hypokalemia Replace and follow replace magnesium level monitor on telemetry repeat EKG  Hypomagnesemia Continue to replace  Prolonged QT interval - will monitor on tele avoid QT prolonging medications, rehydrate correct electrolytes   Anemia Anemia obtain anemia panel Hemoccult stool follow CBC expect some decrease in hemoglobin after  rehydration Replace folic acid   Other plan as per orders.  DVT prophylaxis:  SCD       Code Status:    Code Status: Prior FULL CODE  as per patient   I had personally discussed CODE STATUS with patient    Family Communication:   Family not at  Bedside    Disposition Plan:   To home once workup is complete and patient is stable   Following barriers for discharge:                            Electrolytes corrected                               Anemia stable                                              Consults called:    sent msg to nephrology aware will facilitate follow-up as an outpatient  Admission status:  ED Disposition     ED Disposition  Admit   Condition  --   [place]: [facility] [[id number]]  Level of Care: Telemetry [5]  Admit to  tele based on following criteria: Monitor QTC interval  May admit patient to [facility] or [facility] if equivalent level of care is available:: No  Covid Evaluation: Asymptomatic - no recent exposure (last 10 days) testing not required  Diagnosis: AKI (acute kidney injury) [facility]) [[id number]]  Admitting Physician: Toy Baker [[id number]]  Attending Physician: Toy Baker [[id number]]  Certification:: I certify this patient will need inpatient services for at least 2 midnights  Estimated Length of Stay: 2            inpatient     I Expect 2 midnight stay secondary to severity of patient's current illness need for inpatient interventions justified by the following:    Severe lab/radiological/exam abnormalities including:   Hypokalemia hypomagnesemia hypocalcemia and extensive comorbidities including:   COPD/asthma    That are currently affecting medical management.   I expect  patient to be hospitalized for 2 midnights requiring inpatient medical care.  Patient is at high risk for adverse outcome (such as loss of life or disability) if not treated.  Indication for inpatient stay as follows:  Severe  electrolyte abnormalities   Need for   IV fluids, IV electrolyte replacement    Level of care     tele  For   24H         Nikhita Mentzel 11/06/2021, 8:47 PM    [facility]     after 2 AM please page floor coverage PA If 7AM-7PM, please contact the day team taking care of the patient using [email or web address]   Patient was evaluated in the context of the global COVID-19 pandemic, which necessitated consideration that the patient might be at risk for infection with the SARS-CoV-2 virus that causes COVID-19. Institutional protocols and algorithms that pertain to the evaluation of patients at risk for COVID-19 are in a state of rapid change based on information released by regulatory bodies including the CDC and federal and state organizations. These policies and algorithms  were followed during the patient's care.

## 2021-11-06 NOTE — Assessment & Plan Note (Addendum)
Obtain urine electrolytes gently rehydrate continue to follow Given recurrent electrolyte abnormalities will need to have follow-up with nephrology Discussed with Dr. Posey Pronto who will "facilitate a follow up and put details in chart on Monday" And recommends "order a 24hour urine collection for sodium, potassium, magnesium, calcium and creatinine while she's admitted"  Has been ordred

## 2021-11-06 NOTE — ED Triage Notes (Addendum)
Pt states he was told by his PCP to come in for low magnesium. Pt had blood drawn yesterday. Pt has no c/o at this time.

## 2021-11-06 NOTE — Assessment & Plan Note (Signed)
Anemia obtain anemia panel Hemoccult stool follow CBC expect some decrease in hemoglobin after rehydration

## 2021-11-06 NOTE — ED Provider Notes (Signed)
[facility] DEPT Provider Note   CSN: [id number] Arrival date & time: 11/06/21  1445     History  Chief Complaint  Patient presents with   Abnormal Lab    Richard Gates is a 61 y.o. male who presents the emergency department for concern of abnormal labs.  Patient was recently admitted to the hospital for electrolyte disturbances, thought to be secondary to chronic diarrhea.  He had had abnormal labs from his primary doctor and they recommended ER follow-up.  He has had a follow-up appointment with his doctor since being discharged on 6/4 and had normal lab work.  They had him continue on B12, magnesium, and vitamin D replacement and he has been taking these as prescribed.  He states that his diarrhea has improved.  He is supposed to follow-up with GI but has not done so yet.  After his appointment on 7/5, the office called him to make him aware of abnormal magnesium and potassium levels and recommended ER evaluation.  He has no complaints at this time.   Abnormal Lab      Home Medications Prior to Admission medications   Medication Sig Start Date End Date Taking? Authorizing Provider  albuterol (VENTOLIN HFA) 108 (90 Base) MCG/ACT inhaler Inhale 2 puffs into the lungs every 6 (six) hours as needed for wheezing or shortness of breath. 10/19/21  Yes Copland, Gay Filler, MD  cholecalciferol (VITAMIN D) 25 MCG tablet Take 3 tablets (3,000 Units total) by mouth daily. 10/04/21 01/02/22 Yes Dahal, Marlowe Aschoff, MD  fluticasone-salmeterol (ADVAIR) 250-50 MCG/ACT AEPB Inhale 1 puff into the lungs in the morning and at bedtime. 10/05/21  Yes Paz, Alda Berthold, MD  magnesium 30 MG tablet Take 30 mg by mouth 2 (two) times daily.   Yes [provider]  pantoprazole (PROTONIX) 40 MG tablet Take 1 tablet (40 mg total) by mouth 2 (two) times daily before a meal. Patient not taking: Reported on 11/06/2021 10/03/21 01/01/22  Terrilee Croak, MD  vitamin B-12 (CYANOCOBALAMIN) 1000 MCG  tablet Take 1 tablet (1,000 mcg total) by mouth daily. Patient not taking: Reported on 11/06/2021 10/05/21   Colon Branch, MD      Allergies    Patient has no known allergies.    Review of Systems   Review of Systems  All other systems reviewed and are negative.   Physical Exam Updated Vital Signs BP 117/65   Pulse 88   Temp 98.1 F (36.7 C) (Oral)   Resp 16   Ht '5\' 6"'$  (1.676 m)   Wt 65.8 kg   SpO2 99%   BMI 23.40 kg/m  Physical Exam Vitals and nursing note reviewed.  Constitutional:      Appearance: Normal appearance.  HENT:     Head: Normocephalic and atraumatic.  Eyes:     Conjunctiva/sclera: Conjunctivae normal.  Cardiovascular:     Rate and Rhythm: Normal rate and regular rhythm.  Pulmonary:     Effort: Pulmonary effort is normal. No respiratory distress.     Breath sounds: Normal breath sounds.  Abdominal:     General: There is no distension.     Palpations: Abdomen is soft.     Tenderness: There is no abdominal tenderness.  Skin:    General: Skin is warm and dry.  Neurological:     General: No focal deficit present.     Mental Status: He is alert.     ED Results / Procedures / Treatments   Labs (all labs  ordered are listed, but only abnormal results are displayed) Labs Reviewed  CBC - Abnormal; Notable for the following components:      Result Value   WBC 3.3 (*)    RBC 3.16 (*)    Hemoglobin 10.7 (*)    HCT 30.7 (*)    All other components within normal limits  BASIC METABOLIC PANEL - Abnormal; Notable for the following components:   Potassium 2.5 (*)    Chloride 93 (*)    Glucose, Bld 106 (*)    Creatinine, Ser 1.38 (*)    Calcium 6.6 (*)    GFR, Estimated 58 (*)    All other components within normal limits  MAGNESIUM - Abnormal; Notable for the following components:   Magnesium 0.8 (*)    All other components within normal limits  URINALYSIS, ROUTINE W REFLEX MICROSCOPIC  SODIUM, URINE, RANDOM  PHOSPHORUS    EKG EKG  Interpretation  Date/Time:  Saturday November 06 2021 17:09:08 EDT Ventricular Rate:  94 PR Interval:  187 QRS Duration: 95 QT Interval:  409 QTC Calculation: 512 R Axis:   68 Text Interpretation: Sinus rhythm new Prolonged QT interval Confirmed by Blanchie Dessert [phone]) on 11/06/2021 5:28:29 PM  Radiology No results found.  Procedures Procedures    Medications Ordered in ED Medications  potassium chloride 10 mEq in 100 mL IVPB (10 mEq Intravenous New Bag/Given 11/06/21 1717)  calcium gluconate 1 g/ 50 mL sodium chloride IVPB (has no administration in time range)  magnesium sulfate IVPB 2 g 50 mL (2 g Intravenous New Bag/Given 11/06/21 1720)  potassium chloride SA (KLOR-CON M) CR tablet 40 mEq (40 mEq Oral Given 11/06/21 1721)    ED Course/ Medical Decision Making/ A&P                           Medical Decision Making Amount and/or Complexity of Data Reviewed Labs: ordered.  Patient is a 61 year old male who presents emergency department for abnormal lab work.  He has no symptoms at this time.  Upon chart review, patient was hospitalized on 6/1 after having abnormal labs leading to EKG changes.  He had a potassium of 2.3, magnesium of 0.6, and these were thought to be likely secondary to chronic diarrhea.  He was given electrolyte replacement and was admitted, discharged on 6/4.  Had a hospital follow-up visit on 6/6 and had improving blood work.  Was continued on potassium magnesium supplements.  Plan was to follow-up with GI to rule out Zollinger-Ellison syndrome.  Was seen again on 7/5, and patient reported he was not taking electrolyte supplements anymore, and had not followed up with GI yet.  Reported his diarrhea had improved, had lab work drawn and was called with the results and advised to come to the ER.  On exam patient has normal vital signs, does not complaining of any symptoms.  Repeat laboratory evaluation ordered with the following pertinent results compared to labs drawn  1 month ago: Hemoglobin 10.7, stable compared to prior.  Potassium of 2.5, creatinine of 1.38.  Magnesium 0.8.  Cardiac monitoring: EKG interpreted by my attending physician showed sinus rhythm with prolonged QT interval of 512.  Ordered magnesium, potassium, and calcium replacement.  I consulted with hospitalist Dr Roel Cluck who will admit. The patient appears reasonably stabilized for admission considering the current resources, flow, and capabilities available in the ED at this time, and I doubt any other [facility] requiring further screening and/or treatment in  the ED prior to admission.  Final Clinical Impression(s) / ED Diagnoses Final diagnoses:  Acute kidney injury ([place])  Hypokalemia  Hypomagnesemia  Hypocalcemia    Rx / DC Orders ED Discharge Orders     None      Portions of this report may have been transcribed using voice recognition software. Every effort was made to ensure accuracy; however, inadvertent computerized transcription errors may be present.    Estill Cotta 11/06/21 1825    Blanchie Dessert, MD 11/07/21 1316

## 2021-11-06 NOTE — ED Provider Triage Note (Signed)
Emergency Medicine Provider Triage Evaluation Note  Patient is in the waiting room. Nursing was called for critical values of potassium of 2.5 and MG of 0.8. EKG was ordered at 1510 but was not completed. Instructed staff EKG needs to be done ASAP to check for changes. PO potassium, IV mag, and IV potassium ordered. Nursing aware that patient is the next to room.    Sherrell Puller, PA-C 11/06/21 1640

## 2021-11-06 NOTE — Assessment & Plan Note (Signed)
Replace and follow replace magnesium level monitor on telemetry repeat EKG

## 2021-11-06 NOTE — Telephone Encounter (Signed)
Received call from call service noting a critical lab value of a magnesium of 0.5. other labs reviewed indicating low calcium and mildly low potassium. Patient also with kidney dysfunction with creatinine doubled compared to a month ago and low calcium levels. I called and spoke with the patient about the lab results. He noted his diarrhea was better than it was when he went to the hospital a month ago. I advised that he go to the ED for evaluation as his magnesium was dangerously low and he had evidence of kidney injury. Discussed he would need electrolyte replacement and IV fluids. He wondered how quickly he needed to go as he has a trip planned and I advised he should go right away given cardiac risk and muscular risk and due to his kidney dysfunction. Discussed the risk of abnormal heart rhythms that could result in death. The patient noted he would go later today and I encouraged him to go as soon as possible given the risks. Discussed that they may treat him and observe in the ED or they may admit him over night. I advised that the ED would have access to this note and to his lab results so they would know how to proceed with treatment and further evaluation. I will forward to the patients PCP.

## 2021-11-06 NOTE — Assessment & Plan Note (Signed)
-   will monitor on tele avoid QT prolonging medications, rehydrate correct electrolytes ? ?

## 2021-11-06 NOTE — Assessment & Plan Note (Signed)
Patient not on statins at home we will have him follow-up with primary care

## 2021-11-06 NOTE — Assessment & Plan Note (Addendum)
Replace and continue to follow Monitor on telemetry

## 2021-11-06 NOTE — Assessment & Plan Note (Signed)
Continue to replace

## 2021-11-06 NOTE — Subjective & Objective (Signed)
Pt with hx of chronic diarrhea and electrolyte abnormalities Was called by PCP because his electrolytes were abnormal again. Patient was admitted in the beginning of June with chronic diarrhea for the past 6 months has been seen by GI work-up has included CTA which was unremarkable His PPI was discontinued for her to have may be diarrhea secondary to PPI and his diarrhea has improved But he was noted to have low potassium and was admitted his electrolytes took few days to replace and he was able to be discharged home During his hospital stay he was seen by [facility] he underwent EGD and colonoscopy noted to have esophagitis gastritis and duodenitis Suspicion of Zollinger-Ellison syndrome.  Gastrin level is pending.  At after discharge was supposed to follow GI  started on PPI twice daily, Lomotil 4 times daily as needed.  He was seen by his PCP his labs were checked and he was noted to again have abnormality of electrolytes and was sent to emergency department to have it repleted

## 2021-11-07 DIAGNOSIS — E876 Hypokalemia: Secondary | ICD-10-CM | POA: Diagnosis not present

## 2021-11-07 DIAGNOSIS — E538 Deficiency of other specified B group vitamins: Secondary | ICD-10-CM

## 2021-11-07 DIAGNOSIS — N179 Acute kidney failure, unspecified: Secondary | ICD-10-CM | POA: Diagnosis not present

## 2021-11-07 DIAGNOSIS — D649 Anemia, unspecified: Secondary | ICD-10-CM | POA: Diagnosis not present

## 2021-11-07 DIAGNOSIS — E559 Vitamin D deficiency, unspecified: Secondary | ICD-10-CM

## 2021-11-07 LAB — COMPREHENSIVE METABOLIC PANEL
ALT: 8 U/L (ref 0–44)
AST: 16 U/L (ref 15–41)
Albumin: 3 g/dL — ABNORMAL LOW (ref 3.5–5.0)
Alkaline Phosphatase: 70 U/L (ref 38–126)
Anion gap: 9 (ref 5–15)
BUN: 13 mg/dL (ref 8–23)
CO2: 30 mmol/L (ref 22–32)
Calcium: 7.1 mg/dL — ABNORMAL LOW (ref 8.9–10.3)
Chloride: 104 mmol/L (ref 98–111)
Creatinine, Ser: 1.14 mg/dL (ref 0.61–1.24)
GFR, Estimated: >60 mL/min (ref >60)
Glucose, Bld: 103 mg/dL — ABNORMAL HIGH (ref 70–99)
Potassium: 3.4 mmol/L — ABNORMAL LOW (ref 3.5–5.1)
Sodium: 143 mmol/L (ref 135–145)
Total Bilirubin: 0.6 mg/dL (ref 0.3–1.2)
Total Protein: 5.8 g/dL — ABNORMAL LOW (ref 6.5–8.1)

## 2021-11-07 LAB — BASIC METABOLIC PANEL
Anion gap: 8 (ref 5–15)
Anion gap: 6 (ref 5–15)
BUN: 10 mg/dL (ref 8–23)
BUN: 10 mg/dL (ref 8–23)
CO2: 29 mmol/L (ref 22–32)
CO2: 29 mmol/L (ref 22–32)
Calcium: 7.4 mg/dL — ABNORMAL LOW (ref 8.9–10.3)
Calcium: 8 mg/dL — ABNORMAL LOW (ref 8.9–10.3)
Chloride: 103 mmol/L (ref 98–111)
Chloride: 108 mmol/L (ref 98–111)
Creatinine, Ser: 1 mg/dL (ref 0.61–1.24)
Creatinine, Ser: 0.95 mg/dL (ref 0.61–1.24)
GFR, Estimated: >60 mL/min (ref >60)
GFR, Estimated: >60 mL/min (ref >60)
Glucose, Bld: 116 mg/dL — ABNORMAL HIGH (ref 70–99)
Glucose, Bld: 107 mg/dL — ABNORMAL HIGH (ref 70–99)
Potassium: 4.6 mmol/L (ref 3.5–5.1)
Potassium: 4.3 mmol/L (ref 3.5–5.1)
Sodium: 140 mmol/L (ref 135–145)
Sodium: 143 mmol/L (ref 135–145)

## 2021-11-07 LAB — CBC
HCT: 27.3 % — ABNORMAL LOW (ref 39.0–52.0)
Hemoglobin: 9.3 g/dL — ABNORMAL LOW (ref 13.0–17.0)
MCH: 33.6 pg (ref 26.0–34.0)
MCHC: 34.1 g/dL (ref 30.0–36.0)
MCV: 98.6 fL (ref 80.0–100.0)
Platelets: 305 10*3/uL (ref 150–400)
RBC: 2.77 MIL/uL — ABNORMAL LOW (ref 4.22–5.81)
RDW: 14.2 % (ref 11.5–15.5)
WBC: 5.4 10*3/uL (ref 4.0–10.5)
nRBC: 0 % (ref 0.0–0.2)

## 2021-11-07 LAB — MAGNESIUM
Magnesium: 1.2 mg/dL — ABNORMAL LOW (ref 1.7–2.4)
Magnesium: 3.6 mg/dL — ABNORMAL HIGH (ref 1.7–2.4)

## 2021-11-07 LAB — OSMOLALITY, URINE: Osmolality, Ur: 226 mOsm/kg — ABNORMAL LOW (ref 300–900)

## 2021-11-07 LAB — PREALBUMIN: Prealbumin: 32.3 mg/dL (ref 18–38)

## 2021-11-07 LAB — PHOSPHORUS: Phosphorus: 3.4 mg/dL (ref 2.5–4.6)

## 2021-11-07 MED ORDER — DIPHENOXYLATE-ATROPINE 2.5-0.025 MG PO TABS
1.0000 | ORAL_TABLET | Freq: Four times a day (QID) | ORAL | Status: DC | PRN
  Administered 2021-11-07: 1 via ORAL
  Filled 2021-11-07: qty 1

## 2021-11-07 MED ORDER — DEXTROSE 5 % IV SOLN
15.0000 mmol | Freq: Once | INTRAVENOUS | Status: AC
  Administered 2021-11-07: 15 mmol via INTRAVENOUS
  Filled 2021-11-07: qty 5

## 2021-11-07 MED ORDER — MAGNESIUM SULFATE 2 GM/50ML IV SOLN
2.0000 g | Freq: Once | INTRAVENOUS | Status: AC
  Administered 2021-11-07: 2 g via INTRAVENOUS
  Filled 2021-11-07: qty 50

## 2021-11-07 MED ORDER — CALCIUM CARBONATE 1250 (500 CA) MG PO TABS
1.0000 | ORAL_TABLET | Freq: Three times a day (TID) | ORAL | Status: DC
  Administered 2021-11-07 – 2021-11-09 (×6): 1250 mg via ORAL
  Filled 2021-11-07 (×8): qty 1

## 2021-11-07 MED ORDER — POTASSIUM CHLORIDE CRYS ER 20 MEQ PO TBCR
40.0000 meq | EXTENDED_RELEASE_TABLET | Freq: Two times a day (BID) | ORAL | Status: DC
Start: 2021-11-07 — End: 2021-11-09
  Administered 2021-11-07 – 2021-11-09 (×5): 40 meq via ORAL
  Filled 2021-11-07 (×5): qty 2

## 2021-11-07 MED ORDER — VITAMIN D 25 MCG (1000 UNIT) PO TABS
1000.0000 [IU] | ORAL_TABLET | Freq: Every day | ORAL | Status: DC
  Administered 2021-11-07 – 2021-11-09 (×3): 1000 [IU] via ORAL
  Filled 2021-11-07 (×3): qty 1

## 2021-11-07 NOTE — Assessment & Plan Note (Signed)
Hemoglobin stable relative to baseline - Continue follow-up with GI

## 2021-11-07 NOTE — Assessment & Plan Note (Signed)
Blood pressure controlled. 

## 2021-11-07 NOTE — Assessment & Plan Note (Addendum)
Potassium still low Patient only received 6 tablets of potassium at his last discharge - Continue potassium supplement - Will need potassium prescription at discharge for longer than 6 days

## 2021-11-07 NOTE — Assessment & Plan Note (Signed)
Creatinine resolved

## 2021-11-07 NOTE — Telephone Encounter (Signed)
Thank you Dr Caryl Bis

## 2021-11-07 NOTE — Assessment & Plan Note (Signed)
-   Monitor on telemetry

## 2021-11-07 NOTE — Assessment & Plan Note (Signed)
-   Needs follow-up with GI at discharge - Continue Lomotil as needed

## 2021-11-07 NOTE — Assessment & Plan Note (Signed)
-   Will need folate supplement at discharge

## 2021-11-07 NOTE — Assessment & Plan Note (Signed)
-   Continue vitamin D supplement, will need refill at discharge

## 2021-11-07 NOTE — Assessment & Plan Note (Signed)
-   Supplement calcium - Will need instruction for calcium supplements at discharge

## 2021-11-07 NOTE — Progress Notes (Signed)
  Progress Note   Patient: Richard Gates [id number]:[id number] DOB: 1960-08-08 DOA: 11/06/2021     1 DOS: the patient was seen and examined on 11/07/2021 at 1:57 PM      Brief hospital course: Mr. Richard Gates is a 61 y.o. M with HTN, hyperlipidemia, hx DVT, COPD not on home O2, hx CAD never PCI, and recent chronic severe as yet undiagnosed diarrhea who presented for abnormal labs.  Admitted early June due to weakness from diarrhea, underwent colonoscopy and EGD, has yet to follow up with GI.  Went to PCP and had K<3, mag<1, Ca<7 so sent to the ER.     Assessment and Plan: * Hypokalemia Potassium still low Patient only received 6 tablets of potassium at his last discharge - Continue potassium supplement - Will need potassium prescription at discharge for longer than 6 days  Folate deficiency - Will need folate supplement at discharge  Vitamin D deficiency - Continue vitamin D supplement, will need refill at discharge  Anemia Hemoglobin stable relative to baseline - Continue follow-up with GI  AKI (acute kidney injury) ([place]) Creatinine resolved  Chronic diarrhea - Needs follow-up with GI at discharge - Continue Lomotil as needed   Prolonged QT interval - Monitor on telemetry  Hypocalcemia - Supplement calcium - Will need instruction for calcium supplements at discharge  Hypomagnesemia Magnesium still low - IV mag today - Will need Mag-Ox 400 daily or Slow-Mag 2 tabs twice daily at discharge  Hypertension Blood pressure controlled  COPD mixed type (HCC) No symptoms, on Advair at home          Subjective: Patient feeling somewhat better, still having some loose stools today, no confusion, no fever     Physical Exam: Vitals:   11/07/21 1230 11/07/21 1322 11/07/21 1335 11/07/21 1731  BP: (!) 147/90 114/80 (!) 168/83 138/82  Pulse: 82 95 76 82  Resp: '19 17 16 15  '$ Temp:  98 F (36.7 C) 98.1 F (36.7 C) 98.2 F (36.8 C)  TempSrc:   Oral Oral  SpO2: 100%  95% 97% 96%  Weight:      Height:       Adult male, lying in bed, no acute distress RRR, no murmurs, no peripheral edema Respiratory rate normal, lungs clear without rales or wheezes Abdomen soft no tenderness palpation or guarding, no ascites or distention  Data Reviewed: Basic metabolic panel reviewed, potassium 3.4, magnesium 1.2, creatinine 1.1 Folate level low Hemoglobin level low     Disposition: Status is: Inpatient The patient was admitted with multiple electrolyte abnormalities.  His levels are improving but still low.  He will need 1 more day supplementation, then plan for discharge tomorrow.        Author: Edwin Dada, MD 11/07/2021 6:02 PM  For on call review [email or web address].

## 2021-11-07 NOTE — Assessment & Plan Note (Signed)
No symptoms, on Advair at home

## 2021-11-07 NOTE — Hospital Course (Signed)
Richard Gates is a 61 y.o. M with HTN, hyperlipidemia, hx DVT, COPD not on home O2, hx CAD never PCI, and recent chronic severe as yet undiagnosed diarrhea who presented for abnormal labs.  Admitted early June due to weakness from diarrhea, underwent colonoscopy and EGD, has yet to follow up with GI.  Went to PCP and had K<3, mag<1, Ca<7 so sent to the ER.

## 2021-11-07 NOTE — Assessment & Plan Note (Addendum)
Magnesium still low - IV mag today - Will need Mag-Ox 400 daily or Slow-Mag 2 tabs twice daily at discharge

## 2021-11-08 DIAGNOSIS — N179 Acute kidney failure, unspecified: Secondary | ICD-10-CM | POA: Diagnosis not present

## 2021-11-08 DIAGNOSIS — E876 Hypokalemia: Secondary | ICD-10-CM

## 2021-11-08 DIAGNOSIS — J449 Chronic obstructive pulmonary disease, unspecified: Secondary | ICD-10-CM

## 2021-11-08 DIAGNOSIS — Z720 Tobacco use: Secondary | ICD-10-CM

## 2021-11-08 DIAGNOSIS — I10 Essential (primary) hypertension: Secondary | ICD-10-CM

## 2021-11-08 DIAGNOSIS — K529 Noninfective gastroenteritis and colitis, unspecified: Principal | ICD-10-CM

## 2021-11-08 DIAGNOSIS — R9431 Abnormal electrocardiogram [ECG] [EKG]: Secondary | ICD-10-CM

## 2021-11-08 DIAGNOSIS — E538 Deficiency of other specified B group vitamins: Secondary | ICD-10-CM

## 2021-11-08 DIAGNOSIS — E785 Hyperlipidemia, unspecified: Secondary | ICD-10-CM

## 2021-11-08 DIAGNOSIS — D649 Anemia, unspecified: Secondary | ICD-10-CM | POA: Diagnosis not present

## 2021-11-08 LAB — RENAL FUNCTION PANEL
Albumin: 3.1 g/dL — ABNORMAL LOW (ref 3.5–5.0)
Anion gap: 8 (ref 5–15)
BUN: 9 mg/dL (ref 8–23)
CO2: 26 mmol/L (ref 22–32)
Calcium: 7.9 mg/dL — ABNORMAL LOW (ref 8.9–10.3)
Chloride: 108 mmol/L (ref 98–111)
Creatinine, Ser: 0.87 mg/dL (ref 0.61–1.24)
GFR, Estimated: >60 mL/min (ref >60)
Glucose, Bld: 93 mg/dL (ref 70–99)
Phosphorus: 2 mg/dL — ABNORMAL LOW (ref 2.5–4.6)
Potassium: 4.4 mmol/L (ref 3.5–5.1)
Sodium: 142 mmol/L (ref 135–145)

## 2021-11-08 LAB — MAGNESIUM: Magnesium: 1.6 mg/dL — ABNORMAL LOW (ref 1.7–2.4)

## 2021-11-08 MED ORDER — CALCIUM GLUCONATE-NACL 1-0.675 GM/50ML-% IV SOLN
1.0000 g | Freq: Once | INTRAVENOUS | Status: AC
  Administered 2021-11-08: 1000 mg via INTRAVENOUS
  Filled 2021-11-08: qty 50

## 2021-11-08 MED ORDER — ADULT MULTIVITAMIN W/MINERALS CH
1.0000 | ORAL_TABLET | Freq: Every day | ORAL | Status: DC
  Administered 2021-11-08 – 2021-11-09 (×2): 1 via ORAL
  Filled 2021-11-08 (×2): qty 1

## 2021-11-08 MED ORDER — MAGNESIUM OXIDE -MG SUPPLEMENT 400 (240 MG) MG PO TABS
400.0000 mg | ORAL_TABLET | Freq: Two times a day (BID) | ORAL | Status: DC
  Administered 2021-11-08 – 2021-11-09 (×2): 400 mg via ORAL
  Filled 2021-11-08 (×2): qty 1

## 2021-11-08 NOTE — Progress Notes (Signed)
Initial Nutrition Assessment  INTERVENTION:   -Multivitamin with minerals daily  -Liberalize diet to regular to maximize menu options  NUTRITION DIAGNOSIS:   Inadequate oral intake related to diarrhea as evidenced by per patient/family report.  GOAL:   Patient will meet greater than or equal to 90% of their needs  MONITOR:   PO intake, Supplement acceptance, Labs, Weight trends, I & O's  REASON FOR ASSESSMENT:   Consult Assessment of nutrition requirement/status  ASSESSMENT:   61 y.o. M with HTN, hyperlipidemia, hx DVT, COPD not on home O2, hx CAD never PCI, and recent chronic severe as yet undiagnosed diarrhea who presented for abnormal labs.     Admitted early June due to weakness from diarrhea, underwent colonoscopy and EGD, has yet to follow up with GI.  Patient reports having diarrhea and some N/V for about a month PTA. States his appetite has started to improve and he is now feeling hungry. States he is eating well now, would like better menu options. Liberalized diet to regular, pt appreciative.  Pt is not interested in protein supplements. States he has been taking vitamin supplementation.  Per weight records, pt has lost 13 lbs since 2/1 (8% wt loss x 5 months, insignificant for time frame).   Medications: OSCAL, Vitamin D, Folic acid, MAG-OX, Multivitamin with minerals daily, KLOR-CON, Calcium gluconate  Labs reviewed:  Low Mg Low Phos  NUTRITION - FOCUSED PHYSICAL EXAM:  No depletion noted.  Diet Order:   Diet Order             Diet regular Room service appropriate? Yes; Fluid consistency: Thin  Diet effective now                   EDUCATION NEEDS:   No education needs have been identified at this time  Skin:  Skin Assessment: Reviewed RN Assessment  Last BM:  7/9  Height:   Ht Readings from Last 1 Encounters:  11/06/21 '5\' 6"'$  (1.676 m)    Weight:   Wt Readings from Last 1 Encounters:  11/07/21 67.7 kg    BMI:  Body mass index is  24.09 kg/m.  Estimated Nutritional Needs:   Kcal:  2000-2200  Protein:  80-90g  Fluid:  2L/day   Clayton Bibles, MS, RD, LDN Inpatient Clinical Dietitian Contact information available via Am[employer]

## 2021-11-08 NOTE — Progress Notes (Addendum)
7.9, Progress Note   Patient: Richard Gates TTS:[id number] DOB: [birth year] DOA: 11/06/2021     2 DOS: the patient was seen and examined on 11/08/2021   Brief hospital course: Mr. Rakestraw is a 61 y.o. M with HTN, hyperlipidemia, hx DVT, COPD not on home O2, hx CAD never PCI, and recent chronic severe as yet undiagnosed diarrhea who presented for abnormal labs.  Admitted early June due to weakness from diarrhea, underwent colonoscopy and EGD, has yet to follow up with GI.  Went to PCP and had K<3, mag<1, Ca<7 so sent to the ER.  Assessment and Plan: * Hypokalemia Potassium still low Patient only received 6 tablets of potassium at his last discharge - Continue potassium supplement - Will need potassium prescription at discharge for longer than 6 days  Folate deficiency - Will need folate supplement at discharge  Vitamin D deficiency - Continue vitamin D supplement, will need refill at discharge  Anemia Hemoglobin stable relative to baseline - Continue follow-up with GI  AKI (acute kidney injury) ([place]) Creatinine resolved  Chronic diarrhea - Needs follow-up with GI at discharge - Continue Lomotil as needed   Prolonged QT interval - Monitor on telemetry  Hypocalcemia - Supplement calcium - Will need instruction for calcium supplements at discharge  Hypomagnesemia Magnesium still low - IV mag today - Added on mag oxide 400 mg twice daily  Hypertension Blood pressure controlled  COPD mixed type (HCC) No symptoms, on Advair at home        Subjective: Patient makes note that he had a loose stool yesterday, but no cramping.  The Imodium has helped and he had a regular bowel movement this morning.  He reiterates that he needs to be given enough medicine to keep his electrolytes stable.  Physical Exam: Vitals:   11/07/21 2131 11/08/21 0700 11/08/21 1303 11/08/21 2103  BP: (!) 153/79 (!) 146/80 (!) 156/77 (!) 180/88  Pulse: 91 78 90 82  Resp: '18 16 16 17  '$ Temp:  98.3 F (36.8 C) 98.6 F (37 C) 98.3 F (36.8 C) 99.5 F (37.5 C)  TempSrc: Oral Oral Oral Oral  SpO2: 97% 96% 97% 97%  Weight: 67.7 kg     Height:       Adult male currently in no acute distress RRR, no murmurs, no peripheral edema Lungs sound clear with no significant wheezes or rhonchi.  Patient able to talk in complete sentences Abdomen soft and not distended with positive bowel sounds in all 4 quadrants  Data Reviewed:  Reviewed electrolytes which noted potassium stable at 4.4, calcium 7.9, mag 1.6  Family Communication: Wife updated at bedside  Disposition: Status is: Inpatient Remains inpatient appropriate because: Electrolytes still not stabilized  Planned Discharge Destination: Home      Author: Norval Morton, MD 11/08/2021 10:34 PM  For on call review [email or web address].

## 2021-11-09 ENCOUNTER — Other Ambulatory Visit (HOSPITAL_COMMUNITY): Payer: Self-pay

## 2021-11-09 DIAGNOSIS — E876 Hypokalemia: Secondary | ICD-10-CM | POA: Diagnosis not present

## 2021-11-09 LAB — BASIC METABOLIC PANEL
Anion gap: 7 (ref 5–15)
BUN: 9 mg/dL (ref 8–23)
CO2: 24 mmol/L (ref 22–32)
Calcium: 9.3 mg/dL (ref 8.9–10.3)
Chloride: 109 mmol/L (ref 98–111)
Creatinine, Ser: 0.86 mg/dL (ref 0.61–1.24)
GFR, Estimated: >60 mL/min (ref >60)
Glucose, Bld: 106 mg/dL — ABNORMAL HIGH (ref 70–99)
Potassium: 5 mmol/L (ref 3.5–5.1)
Sodium: 140 mmol/L (ref 135–145)

## 2021-11-09 LAB — MAGNESIUM: Magnesium: 1.5 mg/dL — ABNORMAL LOW (ref 1.7–2.4)

## 2021-11-09 MED ORDER — CALCIUM CARBONATE 1250 (500 CA) MG PO TABS
1.0000 | ORAL_TABLET | Freq: Two times a day (BID) | ORAL | 0 refills | Status: DC
  Filled 2021-11-09: qty 60, 30d supply, fill #0

## 2021-11-09 MED ORDER — VITAMIN D3 25 MCG PO TABS
1000.0000 [IU] | ORAL_TABLET | Freq: Every day | ORAL | 0 refills | Status: AC
  Filled 2021-11-09 – 2021-12-02 (×3): qty 30, 30d supply, fill #0
  Filled 2021-12-08: qty 100, 90d supply, fill #0

## 2021-11-09 MED ORDER — DIPHENOXYLATE-ATROPINE 2.5-0.025 MG PO TABS
1.0000 | ORAL_TABLET | Freq: Four times a day (QID) | ORAL | 0 refills | Status: DC | PRN
  Filled 2021-11-09: qty 30, 8d supply, fill #0

## 2021-11-09 MED ORDER — FOLIC ACID 1 MG PO TABS
1.0000 mg | ORAL_TABLET | Freq: Every day | ORAL | 0 refills | Status: DC
  Filled 2021-11-09: qty 30, 30d supply, fill #0

## 2021-11-09 MED ORDER — VITAMIN B-12 1000 MCG PO TABS
1000.0000 ug | ORAL_TABLET | Freq: Every day | ORAL | 0 refills | Status: DC
  Filled 2021-11-09: qty 30, 30d supply, fill #0
  Filled 2022-01-27: qty 100, 100d supply, fill #0

## 2021-11-09 MED ORDER — POTASSIUM CHLORIDE CRYS ER 20 MEQ PO TBCR
40.0000 meq | EXTENDED_RELEASE_TABLET | Freq: Every day | ORAL | 0 refills | Status: DC
  Filled 2021-11-09: qty 30, 15d supply, fill #0

## 2021-11-09 MED ORDER — MAGNESIUM OXIDE -MG SUPPLEMENT 400 (240 MG) MG PO TABS
400.0000 mg | ORAL_TABLET | Freq: Every day | ORAL | 0 refills | Status: DC
  Filled 2021-11-09: qty 30, 30d supply, fill #0

## 2021-11-09 MED ORDER — AMLODIPINE BESYLATE 5 MG PO TABS: 5.0000 mg | ORAL_TABLET | Freq: Every day | ORAL | Status: DC

## 2021-11-09 MED ORDER — AMLODIPINE BESYLATE 5 MG PO TABS
5.0000 mg | ORAL_TABLET | Freq: Every day | ORAL | 0 refills | Status: DC
  Filled 2021-11-09: qty 30, 30d supply, fill #0

## 2021-11-09 NOTE — Progress Notes (Signed)
Patient ready for discharge, Meds received from Outpatient pharnacy. AVS given and explained to patient. Patient currently eating lunch.

## 2021-11-09 NOTE — Discharge Summary (Signed)
Physician Discharge Summary   Patient: Richard Gates MRN: [id number] DOB: [birth year]  Admit date:     11/06/2021  Discharge date: 11/09/21  Discharge Physician: Edwin Dada   PCP: Colon Branch, MD     Recommendations at discharge:  Follow up with PCP Dr. Larose Kells for repeat BMP, mag, phos in 1 week Dr. Larose Kells: Please repeat folate, vitamin D at appropriate interval as well Follow up with Gastroenterology for diarrhea Dr. Larose Kells: Please recheck BP and adjust new amlodipine as needed     Discharge Diagnoses: Principal Problem:   Hypokalemia Active Problems:   Tobacco abuse   COPD mixed type ([place])   Hypertension   Hyperlipidemia with target LDL less than 70   Hypomagnesemia   Hypocalcemia   Prolonged QT interval   Chronic diarrhea   Acute kidney injury ruled out   Anemia   Vitamin D deficiency   Folate deficiency   Hypertension     Hospital Course: Richard Gates is a 61 y.o. M with HTN, hyperlipidemia, hx DVT, COPD not on home O2, hx CAD never PCI, and recent chronic severe as yet undiagnosed diarrhea who presented for abnormal labs.  Admitted early June due to weakness from diarrhea, underwent colonoscopy and EGD, has yet to follow up with GI.  Went to PCP and had K<3, mag<1, Ca<7 so sent to the ER.  Patient was admitted, given potassium, magnesium and calcium until replete.  He was noted to have folate deficiency and so started on folate supplementation.  He was discharged with one month supply of all supplements.   He was also noted to be hypertensive and was started on amlodipine.            The [facility] Controlled Substances Registry was reviewed for this patient prior to discharge.   Disposition: Home    DISCHARGE MEDICATION: Allergies as of 11/09/2021   No Known Allergies      Medication List     STOP taking these medications    magnesium 30 MG tablet   pantoprazole 40 MG tablet Commonly known as: PROTONIX       TAKE these  medications    albuterol 108 (90 Base) MCG/ACT inhaler Commonly known as: VENTOLIN HFA Inhale 2 puffs into the lungs every 6 (six) hours as needed for wheezing or shortness of breath.   amLODipine 5 MG tablet Commonly known as: NORVASC Take 1 tablet (5 mg total) by mouth daily.   calcium carbonate 1250 (500 Ca) MG tablet Commonly known as: OS-CAL - dosed in mg of elemental calcium Take 1 tablet (1,250 mg total) by mouth 2 (two) times daily with a meal.   diphenoxylate-atropine 2.5-0.025 MG tablet Commonly known as: LOMOTIL Take 1 tablet by mouth 4 (four) times daily as needed for diarrhea or loose stools.   fluticasone-salmeterol 250-50 MCG/ACT Aepb Commonly known as: ADVAIR Inhale 1 puff into the lungs in the morning and at bedtime.   folic acid 1 MG tablet Commonly known as: FOLVITE Take 1 tablet (1 mg total) by mouth daily. Start taking on: November 10, 2021   magnesium oxide 400 (240 Mg) MG tablet Commonly known as: MAG-OX Take 1 tablet (400 mg total) by mouth daily.   potassium chloride SA 20 MEQ tablet Commonly known as: KLOR-CON M Take 2 tablets (40 mEq total) by mouth daily.   vitamin B-12 1000 MCG tablet Commonly known as: CYANOCOBALAMIN Take 1 tablet (1,000 mcg total) by mouth daily.   Vitamin D3 25 MCG  tablet Commonly known as: Vitamin D Take 1 tablet (1,000 Units total) by mouth daily. What changed: how much to take        Iola, MD. Schedule an appointment as soon as possible for a visit in 1 week(s).   Specialty: Internal Medicine Contact information: [phone] [facility] [place] [address] [phone]         Jerene Bears, MD Follow up.   Specialty: Gastroenterology Contact information: [address] [place] [address] [phone]                 Discharge Instructions     Discharge instructions   Complete by: As directed    From Dr. Loleta Books: You were admitted  with abnormal electrolytes While you were here, we found you had high blood pressure.  When you leave: Take folate '1mg'$  daily Take vitamin B12 1000 mcg daily Take vitamin D (cholecalciferol) 1000U daily Take calcium carbonate twice daily Take potassium 40 mEq once daily (take it at night, the absorption is better) Take magnesium/Mag ox 400 mg daily   Go see Dr. Larose Kells in 1 week and have him recheck your blood pressure and electrolytes  It is important that you see the gastroenterologist, Dr. Hilarie Fredrickson, or one of his partners in follow up   Increase activity slowly   Complete by: As directed        Discharge Exam: Filed Weights   11/06/21 1721 11/07/21 2131  Weight: 65.8 kg 67.7 kg    General: Pt is alert, awake, not in acute distress Cardiovascular: RRR, nl S1-S2, no murmurs appreciated.   No LE edema.   Respiratory: Normal respiratory rate and rhythm.  CTAB without rales or wheezes. Abdominal: Abdomen soft and non-tender.  No distension or HSM.   Neuro/Psych: Strength symmetric in upper and lower extremities.  Judgment and insight appear normal.   Condition at discharge: good  The results of significant diagnostics from this hospitalization (including imaging, microbiology, ancillary and laboratory) are listed below for reference.   Imaging Studies: No results found.  Microbiology: Results for orders placed or performed during the hospital encounter of 09/30/21  C Difficile Quick Screen w PCR reflex     Status: None   Collection Time: 09/30/21  3:30 PM   Specimen: STOOL  Result Value Ref Range Status   C Diff antigen NEGATIVE NEGATIVE Final   C Diff toxin NEGATIVE NEGATIVE Final   C Diff interpretation No C. difficile detected.  Final    Comment: Performed at [facility], [place] [address] [place],  [address]  Gastrointestinal Panel by PCR , Stool     Status: None   Collection Time: 09/30/21  3:30 PM   Specimen: Stool  Result Value Ref Range Status    Campylobacter species NOT DETECTED NOT DETECTED Final   Plesimonas shigelloides NOT DETECTED NOT DETECTED Final   Salmonella species NOT DETECTED NOT DETECTED Final   Yersinia enterocolitica NOT DETECTED NOT DETECTED Final   Vibrio species NOT DETECTED NOT DETECTED Final   Vibrio cholerae NOT DETECTED NOT DETECTED Final   Enteroaggregative E coli (EAEC) NOT DETECTED NOT DETECTED Final   Enteropathogenic E coli (EPEC) NOT DETECTED NOT DETECTED Final   Enterotoxigenic E coli (ETEC) NOT DETECTED NOT DETECTED Final   Shiga like toxin producing E coli (STEC) NOT DETECTED NOT DETECTED Final   Shigella/Enteroinvasive E coli (EIEC) NOT DETECTED NOT DETECTED Final  Cryptosporidium NOT DETECTED NOT DETECTED Final   Cyclospora cayetanensis NOT DETECTED NOT DETECTED Final   Entamoeba histolytica NOT DETECTED NOT DETECTED Final   Giardia lamblia NOT DETECTED NOT DETECTED Final   Adenovirus F40/41 NOT DETECTED NOT DETECTED Final   Astrovirus NOT DETECTED NOT DETECTED Final   Norovirus GI/GII NOT DETECTED NOT DETECTED Final   Rotavirus A NOT DETECTED NOT DETECTED Final   Sapovirus (I, II, IV, and V) NOT DETECTED NOT DETECTED Final    Comment: Performed at [facility], [address] [place], [place] [address]  Calprotectin, Fecal     Status: None   Collection Time: 10/01/21 10:51 AM   Specimen: Stool  Result Value Ref Range Status   Calprotectin, Fecal 14 0 - 120 ug/g Final    Comment: (NOTE) Concentration     Interpretation   Follow-Up < 5 - 50 ug/g     Normal           None >50 -120 ug/g     Borderline       Re-evaluate in 4-6 weeks    >120 ug/g     Abnormal         Repeat as clinically                                   indicated Performed At: [facility] [place], [place] [id number] Rush Farmer MD UT:[id number]     Labs: CBC: Recent Labs  Lab 11/06/21 1528 11/07/21 0456  WBC 3.3* 5.4  HGB 10.7* 9.3*  HCT 30.7* 27.3*  MCV 97.2 98.6  PLT 325  568   Basic Metabolic Panel: Recent Labs  Lab 11/06/21 1947 11/07/21 0456 11/07/21 1200 11/07/21 1300 11/07/21 1839 11/08/21 0610 11/09/21 0757  NA 140 143 140  --  143 142 140  K 3.2* 3.4* 4.6  --  4.3 4.4 5.0  CL 96* 104 103  --  108 108 109  CO2 '30 30 29  '$ --  '29 26 24  '$ GLUCOSE 101* 103* 116*  --  107* 93 106*  BUN '17 13 10  '$ --  '10 9 9  '$ CREATININE 1.47* 1.14 1.00  --  0.95 0.87 0.86  CALCIUM 6.9* 7.1* 7.4*  --  8.0* 7.9* 9.3  MG 1.9 1.2*  --  3.6*  --  1.6* 1.5*  PHOS 2.0* 3.4  --   --   --  2.0*  --    Liver Function Tests: Recent Labs  Lab 11/06/21 1947 11/07/21 0456 11/08/21 0610  AST 20 16  --   ALT 11 8  --   ALKPHOS 85 70  --   BILITOT 0.5 0.6  --   PROT 6.7 5.8*  --   ALBUMIN 3.3* 3.0* 3.1*   CBG: No results for input(s): "GLUCAP" in the last 168 hours.  Discharge time spent: approximately 35 minutes spent on discharge counseling, evaluation of patient on day of discharge, and coordination of discharge planning with nursing, social work, pharmacy and case management  Signed: Edwin Dada, MD [facility] 11/09/2021

## 2021-11-09 NOTE — Plan of Care (Signed)

## 2021-11-10 ENCOUNTER — Other Ambulatory Visit: Payer: Self-pay | Admitting: *Deleted

## 2021-11-10 ENCOUNTER — Telehealth: Payer: Self-pay | Admitting: Internal Medicine

## 2021-11-10 DIAGNOSIS — E878 Other disorders of electrolyte and fluid balance, not elsewhere classified: Secondary | ICD-10-CM

## 2021-11-10 NOTE — Telephone Encounter (Addendum)
TCM completed today by [facility] RN. Hospital F/U with PCP on 11/11/21.  Stat referral placed to nephrology.   Pt did have referral and appointment in January 2023 at [facility], however cancelled by patient.   Patient made aware of referral.

## 2021-11-10 NOTE — Telephone Encounter (Signed)
Please call the patient, needs TCM phone call. Also, if importantly has an appointment with nephrology ASAP, dx  multiple electrolyte problems. If that has not been set up, please arrange.

## 2021-11-10 NOTE — Patient Outreach (Signed)
  Care Coordination [facility] Note Transition Care Management Follow-up Telephone Call Date of discharge and from where: 11/09/2021 Richard Gates How have you been since you were released from the hospital? fine Any questions or concerns? No  Items Reviewed: Did the pt receive and understand the discharge instructions provided? Yes  Medications obtained and verified? Yes  Other? No  Any new allergies since your discharge? No  Dietary orders reviewed? Yes Do you have support at home? Yes   Home Care and Equipment/Supplies: Were home health services ordered? no If so, what is the name of the agency? N/a  Has the agency set up a time to come to the patient's home? not applicable Were any new equipment or medical supplies ordered?  No What is the name of the medical supply agency? N/a  Were you able to get the supplies/equipment? not applicable Do you have any questions related to the use of the equipment or supplies? No  Functional Questionnaire: (I = Independent and D = Dependent) ADLs: independent  Bathing/Dressing-  independent  Meal Prep-  independent  Eating-  independent  Maintaining continence-  independent  Transferring/Ambulation-  independent  Managing Meds-  independent   Follow up appointments reviewed:  PCP Hospital f/u appt confirmed? Yes  Scheduled to see [phone] 1:40. [place] Hospital f/u appt confirmed? N Are transportation arrangements needed? No  If their condition worsens, is the pt aware to call PCP or go to the Emergency Dept.? Yes Was the patient provided with contact information for the PCP's office or ED? Y  Was to pt encouraged to call back with questions or concerns? Yes  SDOH assessments and interventions completed:   Yes  Care Coordination Interventions Activated:  No Care Coordination Interventions:   N/a  Encounter Outcome:  Pt. Visit Completed

## 2021-11-11 ENCOUNTER — Ambulatory Visit (INDEPENDENT_AMBULATORY_CARE_PROVIDER_SITE_OTHER): Payer: 59 | Admitting: Internal Medicine

## 2021-11-11 ENCOUNTER — Encounter: Payer: Self-pay | Admitting: Internal Medicine

## 2021-11-11 VITALS — BP 132/84 | HR 98 | Temp 98.1°F | Resp 16 | Ht 66.0 in | Wt 146.5 lb

## 2021-11-11 DIAGNOSIS — D649 Anemia, unspecified: Secondary | ICD-10-CM | POA: Diagnosis not present

## 2021-11-11 DIAGNOSIS — R634 Abnormal weight loss: Secondary | ICD-10-CM | POA: Diagnosis not present

## 2021-11-11 DIAGNOSIS — E878 Other disorders of electrolyte and fluid balance, not elsewhere classified: Secondary | ICD-10-CM

## 2021-11-11 MED ORDER — CALCIUM CARBONATE 1250 (500 CA) MG PO TABS
1.0000 | ORAL_TABLET | Freq: Two times a day (BID) | ORAL | 0 refills | Status: DC
Start: 2021-11-11 — End: 2021-12-02

## 2021-11-11 MED ORDER — MAGNESIUM OXIDE -MG SUPPLEMENT 400 (240 MG) MG PO TABS: 400.0000 mg | ORAL_TABLET | Freq: Every day | ORAL | 0 refills | Status: DC

## 2021-11-11 NOTE — Patient Instructions (Addendum)
I printed a prescription for calcium and magnesium.  Be sure you take the medications as prescribed.  If you run out of any of the meds, please call the office.  Drink plenty of fluid  We are referring you to the "nephrologist" this is a doctor who is specializes in electrolyte abnormalities.   Go to the FRONT desk:  - Schedule an appointment for blood work on  Monday - Schedule office visit with me in 4-week

## 2021-11-11 NOTE — Progress Notes (Signed)
Subjective:    Patient ID: Richard Gates, male    DOB: 12-Dec-1960, 61 y.o.   MRN: [id number]  DOS:  11/11/2021 Type of visit - description: Hospital follow-up, TCM 7  After the last visit, electrolytes were found abnormal again. He was readmitted to the hospital 11/06/2021 to 11/09/2021  During the hospital admission: Calcium, magnesium and potassium were replenish Creatinine increased temporarily. Folic acid found to be low. Has anemia, hemoglobin dropped to 9.3, iron normal, ferritin elevated. I see that upon admission, case was discussed with nephrology, Dr. Posey Pronto. They recommended a 24-hour urine collection and  outpatient follow-up   Wt Readings from Last 3 Encounters:  11/11/21 146 lb 8 oz (66.5 kg)  11/07/21 149 lb 4 oz (67.7 kg)  11/03/21 145 lb 12.8 oz (66.1 kg)    Review of Systems Since he left the hospital is feeling well. No nausea or vomiting. Bowel movements are normal, daily.  No diarrhea. Energy level is normal.  No cramps. He has not been taking his medications correctly  Past Medical History:  Diagnosis Date   COPD (chronic obstructive pulmonary disease) (HCC)    Coronary artery calcification seen on CAT scan 08/2019   Coronary calcium score 103; short LM with<25% mixed calcific plaque (minimal); aortic atherosclerosis with normal size.  No dissection.  No aortic valve calcification   GERD (gastroesophageal reflux disease)    Hyperlipidemia    Hypertension     Past Surgical History:  Procedure Laterality Date   APPENDECTOMY     BIOPSY  10/02/2021   Procedure: BIOPSY;  Surgeon: Jerene Bears, MD;  Location: [facility] ENDOSCOPY;  Service: Gastroenterology;;   COLONOSCOPY  08/24/2016   COLONOSCOPY WITH PROPOFOL N/A 10/02/2021   Procedure: COLONOSCOPY WITH PROPOFOL;  Surgeon: Jerene Bears, MD;  Location: [place];  Service: Gastroenterology;  Laterality: N/A;   ESOPHAGOGASTRODUODENOSCOPY (EGD) WITH PROPOFOL N/A 10/02/2021   Procedure:  ESOPHAGOGASTRODUODENOSCOPY (EGD) WITH PROPOFOL;  Surgeon: Jerene Bears, MD;  Location: [facility] ENDOSCOPY;  Service: Gastroenterology;  Laterality: N/A;   POLYPECTOMY     TRANSTHORACIC ECHOCARDIOGRAM  04/2018   EF 65-70% (vigorous). No RWMA.  Gr 1 DD.  Normal valves.  (In setting of COPD exacerbation)    Current Outpatient Medications  Medication Instructions   albuterol (VENTOLIN HFA) 108 (90 Base) MCG/ACT inhaler 2 puffs, Inhalation, Every 6 hours PRN   amLODipine (NORVASC) 5 mg, Oral, Daily   calcium carbonate (OS-CAL - DOSED IN MG OF ELEMENTAL CALCIUM) 1,250 mg, Oral, 2 times daily with meals   diphenoxylate-atropine (LOMOTIL) 2.5-0.025 MG tablet 1 tablet, Oral, 4 times daily PRN   fluticasone-salmeterol (ADVAIR) 250-50 MCG/ACT AEPB 1 puff, Inhalation, 2 times daily   folic acid (FOLVITE) 1 mg, Oral, Daily   magnesium oxide (MAG-OX) 400 mg, Oral, Daily   potassium chloride SA (KLOR-CON M) 20 MEQ tablet 40 mEq, Oral, Daily   vitamin B-12 (CYANOCOBALAMIN) 1,000 mcg, Oral, Daily   Vitamin D3 (VITAMIN D) 1,000 Units, Oral, Daily       Objective:   Physical Exam BP 132/84   Pulse 98   Temp 98.1 F (36.7 C) (Oral)   Resp 16   Ht '5\' 6"'$  (1.676 m)   Wt 146 lb 8 oz (66.5 kg)   SpO2 96%   BMI 23.65 kg/m  General:   Well developed, NAD, BMI noted.  HEENT:  Normocephalic . Face symmetric, atraumatic Lungs:  CTA B Normal respiratory effort, no intercostal retractions, no accessory muscle use. Heart: RRR,  no murmur.  Abdomen:  Not distended, soft, non-tender. No rebound or rigidity.   Skin: Not pale. Not jaundice Lower extremities: no pretibial edema bilaterally  Neurologic:  alert & oriented X3.  Speech normal, gait appropriate for age and unassisted Psych--  Cognition and judgment appear intact.  Cooperative with normal attention span and concentration.  Behavior appropriate. No anxious or depressed appearing.     Assessment      ASSESSMENT Hyperglycemia HTN GERD Hyperlipidemia, high TG Allergies , nasal  COPD, PFTs 2013 showed ratio of 58, FEV1 of 1.78- 52% without significant bronchodilator response, TLC was 77% with DLCO 78% Smoker: quit 2020 Aortic sclerosis per CT 05-2020 Coronary CTA, Ca+ Co score 103.0, L main artery, saw cards, probably still a low risk situation.  Plan is CV RF,  ASA "no clear data suggest benefit" per cardiology note 11/08/2019 DVT L leg dx  05-31-2021  PLAN: TCM 7 Electrolyte disturbance. Was found to have hypocalcemia, hypokalemia and hypomagnesemia again despite the fact that he was not having diarrhea. Appropriately, hospital team  reached out to nephrology, they recommended an outpatient follow-up, I already placed a urgent referral.  Due to some confusion he has not been taking calcium low magnesium in the last 2 days. Medication list was reviewed in great detail to prevent confusion,  all questions answer and clarify.  Hopefully he will now follow-up the medication list as recommended. Good hydration recommend Will asked the patient to come back in 4 days for blood work: BMP, ionized calcium, magnesium, CBC Weight loss: It has stabilized Anemia: Since 01-2021 when he is started having electrolyte problems. Ferritin is elevated. CT abdomen 10/01/2021: Colon changes suggesting possible chronic colitis. Subsequently had a EGD and colonoscopy Had candidiasis and esophagus, antral gastric erosions, diffuse duodenitis, colon showed diverticula, and patchy erythematous membranes.  Pathology show acute erosive duodenitis with foveolar metaplasia. Reactive gastropathy No H. pylori Focal active colitis Has a follow-up scheduled with GI 12/08/2021 RTC labs 4 days RTC office visit 4 weeks

## 2021-11-12 NOTE — Assessment & Plan Note (Addendum)
TCM 7 Electrolyte disturbance. Was found to have hypocalcemia, hypokalemia and hypomagnesemia again despite the fact that he was not having diarrhea. Appropriately, hospital team  reached out to nephrology, they recommended an outpatient follow-up, I already placed a urgent referral.  Due to some confusion he has not been taking calcium low magnesium in the last 2 days. Medication list was reviewed in great detail to prevent confusion,  all questions answer and clarify.  Hopefully he will now follow-up the medication list as recommended. Good hydration recommend Will asked the patient to come back in 4 days for blood work: BMP, ionized calcium, magnesium, CBC Weight loss: It has stabilized Anemia: Since 01-2021 when he is started having electrolyte problems. Ferritin is elevated. CT abdomen 10/01/2021: Colon changes suggesting possible chronic colitis. Subsequently had a EGD and colonoscopy Had candidiasis and esophagus, antral gastric erosions, diffuse duodenitis, colon showed diverticula, and patchy erythematous membranes.  Pathology show acute erosive duodenitis with foveolar metaplasia. Reactive gastropathy No H. pylori Focal active colitis Has a follow-up scheduled with GI 12/08/2021 RTC labs 4 days RTC office visit 4 weeks

## 2021-11-15 ENCOUNTER — Other Ambulatory Visit (INDEPENDENT_AMBULATORY_CARE_PROVIDER_SITE_OTHER): Payer: 59

## 2021-11-15 DIAGNOSIS — E878 Other disorders of electrolyte and fluid balance, not elsewhere classified: Secondary | ICD-10-CM

## 2021-11-16 LAB — CBC WITH DIFFERENTIAL/PLATELET
Basophils Absolute: 0.1 10*3/uL (ref 0.0–0.1)
Basophils Relative: 0.8 % (ref 0.0–3.0)
Eosinophils Absolute: 0 10*3/uL (ref 0.0–0.7)
Eosinophils Relative: 0.4 % (ref 0.0–5.0)
HCT: 32.9 % — ABNORMAL LOW (ref 39.0–52.0)
Hemoglobin: 11 g/dL — ABNORMAL LOW (ref 13.0–17.0)
Lymphocytes Relative: 18.1 % (ref 12.0–46.0)
Lymphs Abs: 1.2 10*3/uL (ref 0.7–4.0)
MCHC: 33.3 g/dL (ref 30.0–36.0)
MCV: 101.8 fl — ABNORMAL HIGH (ref 78.0–100.0)
Monocytes Absolute: 0.6 10*3/uL (ref 0.1–1.0)
Monocytes Relative: 9.8 % (ref 3.0–12.0)
Neutro Abs: 4.6 10*3/uL (ref 1.4–7.7)
Neutrophils Relative %: 70.9 % (ref 43.0–77.0)
Platelets: 432 10*3/uL — ABNORMAL HIGH (ref 150.0–400.0)
RBC: 3.23 Mil/uL — ABNORMAL LOW (ref 4.22–5.81)
RDW: 15.9 % — ABNORMAL HIGH (ref 11.5–15.5)
WBC: 6.5 10*3/uL (ref 4.0–10.5)

## 2021-11-16 LAB — CALCIUM, IONIZED: Calcium, Ion: 5.2 mg/dL (ref 4.7–5.5)

## 2021-11-16 LAB — BASIC METABOLIC PANEL
BUN: 19 mg/dL (ref 6–23)
CO2: 21 mEq/L (ref 19–32)
Calcium: 9.5 mg/dL (ref 8.4–10.5)
Chloride: 106 mEq/L (ref 96–112)
Creatinine, Ser: 1.38 mg/dL (ref 0.40–1.50)
GFR: 55.16 mL/min — ABNORMAL LOW (ref >60.00)
Glucose, Bld: 93 mg/dL (ref 70–99)
Potassium: 5.2 mEq/L — ABNORMAL HIGH (ref 3.5–5.1)
Sodium: 137 mEq/L (ref 135–145)

## 2021-11-16 LAB — MAGNESIUM: Magnesium: 1.3 mg/dL — ABNORMAL LOW (ref 1.5–2.5)

## 2021-11-17 ENCOUNTER — Other Ambulatory Visit: Payer: Self-pay

## 2021-12-02 ENCOUNTER — Other Ambulatory Visit (HOSPITAL_COMMUNITY): Payer: Self-pay

## 2021-12-02 ENCOUNTER — Other Ambulatory Visit: Payer: Self-pay | Admitting: Internal Medicine

## 2021-12-02 ENCOUNTER — Other Ambulatory Visit: Payer: Self-pay

## 2021-12-02 MED ORDER — MAGNESIUM OXIDE -MG SUPPLEMENT 400 (240 MG) MG PO TABS
800.0000 mg | ORAL_TABLET | Freq: Every day | ORAL | 1 refills | Status: DC
  Filled 2021-12-02 – 2021-12-08 (×3): qty 180, 90d supply, fill #0
  Filled 2022-01-27: qty 120, 60d supply, fill #1

## 2021-12-02 MED ORDER — CALCIUM CARBONATE 1250 (500 CA) MG PO TABS
1.0000 | ORAL_TABLET | Freq: Two times a day (BID) | ORAL | 1 refills | Status: DC
  Filled 2021-12-02 – 2021-12-08 (×3): qty 180, 90d supply, fill #0

## 2021-12-07 NOTE — Progress Notes (Unsigned)
12/07/2021 Richard Gates [id number] [birth year]   Chief Complaint: Diarrhea   History of Present Illness: Richard Gates. Richard Gates is a 61 year old male with a past medical history of hypertension, hyperlipidemia, coronary artery calcifications per CT, DVT, GERD symptoms, B12 deficiency anemia, COPD, colon polyps and chronic diarrhea. He is followed by Dr. Hilarie Fredrickson.    Admitted early June due to weakness from diarrhea, underwent colonoscopy and EGD  Readmitted to the hospital 11/06/2021 - 11/09/2021 with electrolyte derangement. Received potassium magnesium and calcium. He was also noted to be hypertensive and was started on Amlodipine. Pantoprazole as discontinued. He was discharged home on Lomotil, Magnesium oxide '400mg'$  QD, calcium carbonae 1,250 mg bid, KCL 30mq daily, folate 133mD and vitamin D 1,000 iu QD.        Latest Ref Rng & Units 11/15/2021    3:40 PM 11/07/2021    4:56 AM 11/06/2021    3:28 PM  CBC  WBC 4.0 - 10.5 K/uL 6.5  5.4  3.3   Hemoglobin 13.0 - 17.0 g/dL 11.0  9.3  10.7   Hematocrit 39.0 - 52.0 % 32.9  27.3  30.7   Platelets 150.0 - 400.0 K/uL 432.0  305  325        Latest Ref Rng & Units 11/15/2021    3:40 PM 11/09/2021    7:57 AM 11/08/2021    6:10 AM  CMP  Glucose 70 - 99 mg/dL 93  106  93   BUN 6 - 23 mg/dL '19  9  9   '$ Creatinine 0.40 - 1.50 mg/dL 1.38  0.86  0.87   Sodium 135 - 145 mEq/L 137  140  142   Potassium 3.5 - 5.1 mEq/L 5.2  5.0  4.4   Chloride 96 - 112 mEq/L 106  109  108   CO2 19 - 32 mEq/L '21  24  26   '$ Calcium 8.4 - 10.5 mg/dL 9.5  9.3  7.9      Colonoscopy 10/01/2019:  One 4 mm polyp in the cecum, removed with a cold snare. Resected and retrieved. - Two 4 to 5 mm polyps in the transverse colon, removed with a cold snare. Resected and retrieved. - One 5 mm polyp in the descending colon, removed with a cold snare. Resected and retrieved. - One 3 mm polyp in the distal rectum, removed with a cold snare. Resected and retrieved. - Diverticulosis  from cecum to sigmoid colon. - Internal hemorrhoids. - 3 year recall 1. Surgical [P], colon, transverse and cecum, polyp (3) - TUBULAR ADENOMA (X3 FRAGMENTS). - NO HIGH GRADE DYSPLASIA OR MALIGNANCY. 2. Surgical [P], colon, descending and rectum, polyp (2) - TUBULAR ADENOMA. - HYPERPLASTIC POLYP. - NO HIGH GRADE DYSPLASIA OR MALIGNANCY.      Current Medications, Allergies, Past Medical History, Past Surgical History, Family History and Social History were reviewed in [employer].   Review of Systems:   Constitutional: Negative for fever, sweats, chills or weight loss.  Respiratory: Negative for shortness of breath.   Cardiovascular: Negative for chest pain, palpitations and leg swelling.  Gastrointestinal: See HPI.  Musculoskeletal: Negative for back pain or muscle aches.  Neurological: Negative for dizziness, headaches or paresthesias.    Physical Exam: There were no vitals taken for this visit. General: Well developed, w   ***male in no acute distress. Head: Normocephalic and atraumatic. Eyes: No scleral icterus. Conjunctiva pink . Ears: Normal auditory acuity. Mouth: Dentition intact. No ulcers or lesions.  Lungs: Clear throughout to auscultation. Heart: Regular rate and rhythm, no murmur. Abdomen: Soft, nontender and nondistended. No masses or hepatomegaly. Normal bowel sounds x 4 quadrants.  Rectal: *** Musculoskeletal: Symmetrical with no gross deformities. Extremities: No edema. Neurological: Alert oriented x 4. No focal deficits.  Psychological: Alert and cooperative. Normal mood and affect  Assessment and Recommendations:  61 year old male with chronic diarrhea, admitted to he hospital with electrolyte derangement. PPI discontinued.   GERD   Anemia. B12 deficiency  History of tubular adenomatous and hyperplastic  colon polyps  -Next colonoscopy due 10/2022

## 2021-12-08 ENCOUNTER — Encounter: Payer: Self-pay | Admitting: Nurse Practitioner

## 2021-12-08 ENCOUNTER — Other Ambulatory Visit (INDEPENDENT_AMBULATORY_CARE_PROVIDER_SITE_OTHER): Payer: 59

## 2021-12-08 ENCOUNTER — Ambulatory Visit: Payer: 59 | Admitting: Nurse Practitioner

## 2021-12-08 ENCOUNTER — Other Ambulatory Visit (HOSPITAL_BASED_OUTPATIENT_CLINIC_OR_DEPARTMENT_OTHER): Payer: Self-pay

## 2021-12-08 VITALS — BP 132/74 | HR 105 | Ht 66.0 in | Wt 143.0 lb

## 2021-12-08 DIAGNOSIS — D649 Anemia, unspecified: Secondary | ICD-10-CM

## 2021-12-08 DIAGNOSIS — K529 Noninfective gastroenteritis and colitis, unspecified: Secondary | ICD-10-CM

## 2021-12-08 DIAGNOSIS — R1011 Right upper quadrant pain: Secondary | ICD-10-CM | POA: Diagnosis not present

## 2021-12-08 DIAGNOSIS — R0781 Pleurodynia: Secondary | ICD-10-CM

## 2021-12-08 LAB — COMPREHENSIVE METABOLIC PANEL
ALT: 15 U/L (ref 0–53)
AST: 29 U/L (ref 0–37)
Albumin: 4.1 g/dL (ref 3.5–5.2)
Alkaline Phosphatase: 132 U/L — ABNORMAL HIGH (ref 39–117)
BUN: 12 mg/dL (ref 6–23)
CO2: 29 mEq/L (ref 19–32)
Calcium: 9.4 mg/dL (ref 8.4–10.5)
Chloride: 97 mEq/L (ref 96–112)
Creatinine, Ser: 1.29 mg/dL (ref 0.40–1.50)
GFR: 59.78 mL/min — ABNORMAL LOW (ref >60.00)
Glucose, Bld: 107 mg/dL — ABNORMAL HIGH (ref 70–99)
Potassium: 3.1 mEq/L — ABNORMAL LOW (ref 3.5–5.1)
Sodium: 138 mEq/L (ref 135–145)
Total Bilirubin: 0.8 mg/dL (ref 0.2–1.2)
Total Protein: 7.1 g/dL (ref 6.0–8.3)

## 2021-12-08 LAB — CBC WITH DIFFERENTIAL/PLATELET
Basophils Absolute: 0.1 10*3/uL (ref 0.0–0.1)
Basophils Relative: 1 % (ref 0.0–3.0)
Eosinophils Absolute: 0 10*3/uL (ref 0.0–0.7)
Eosinophils Relative: 0.6 % (ref 0.0–5.0)
HCT: 33.4 % — ABNORMAL LOW (ref 39.0–52.0)
Hemoglobin: 11.3 g/dL — ABNORMAL LOW (ref 13.0–17.0)
Lymphocytes Relative: 21.2 % (ref 12.0–46.0)
Lymphs Abs: 1.5 10*3/uL (ref 0.7–4.0)
MCHC: 33.9 g/dL (ref 30.0–36.0)
MCV: 97.7 fl (ref 78.0–100.0)
Monocytes Absolute: 0.9 10*3/uL (ref 0.1–1.0)
Monocytes Relative: 13.4 % — ABNORMAL HIGH (ref 3.0–12.0)
Neutro Abs: 4.4 10*3/uL (ref 1.4–7.7)
Neutrophils Relative %: 63.8 % (ref 43.0–77.0)
Platelets: 359 10*3/uL (ref 150.0–400.0)
RBC: 3.42 Mil/uL — ABNORMAL LOW (ref 4.22–5.81)
RDW: 14.3 % (ref 11.5–15.5)
WBC: 6.9 10*3/uL (ref 4.0–10.5)

## 2021-12-08 LAB — MAGNESIUM: Magnesium: 1.4 mg/dL — ABNORMAL LOW (ref 1.5–2.5)

## 2021-12-08 MED ORDER — DIPHENOXYLATE-ATROPINE 2.5-0.025 MG PO TABS: 1.0000 | ORAL_TABLET | Freq: Two times a day (BID) | ORAL | 1 refills | Status: DC

## 2021-12-08 NOTE — Patient Instructions (Signed)
_______________________________________________________  If you are age 62 or older, your body mass index should be between 23-30. Your Body mass index is 23.08 kg/m. If this is out of the aforementioned range listed, please consider follow up with your Primary Care Provider.  If you are age 62 or younger, your body mass index should be between 19-25. Your Body mass index is 23.08 kg/m. If this is out of the aformentioned range listed, please consider follow up with your Primary Care Provider.   ________________________________________________________  The [facility] providers would like to encourage you to use [facility] to communicate with providers for non-urgent requests or questions.  Due to long hold times on the telephone, sending your provider a message by [facility] may be a faster and more efficient way to get a response.  Please allow 48 business hours for a response.  Please remember that this is for non-urgent requests.  _______________________________________________________  Your provider has requested that you go to the basement level for lab work before leaving today. Press "B" on the elevator. The lab is located at the first door on the left as you exit the elevator.  We have sent the following medications to your pharmacy for you to pick up at your convenience:  Lomotil  Take Imodium daily as needed  You will be contacted by [place] in the next 2 days to arrange a chest CT and MRI of the abdomen  The number on your caller ID will be [phone], please answer when they call.  If you have not heard from them in 2 days please call [phone] to schedule.

## 2021-12-09 ENCOUNTER — Other Ambulatory Visit: Payer: Self-pay | Admitting: Internal Medicine

## 2021-12-09 ENCOUNTER — Other Ambulatory Visit (HOSPITAL_BASED_OUTPATIENT_CLINIC_OR_DEPARTMENT_OTHER): Payer: Self-pay

## 2021-12-09 MED ORDER — CALCIUM CARBONATE 1500 (600 CA) MG PO TABS
1200.0000 mg | ORAL_TABLET | Freq: Two times a day (BID) | ORAL | 5 refills | Status: DC
  Filled 2021-12-09 – 2021-12-29 (×2): qty 120, 30d supply, fill #0
  Filled 2022-01-27: qty 150, 38d supply, fill #0

## 2021-12-09 NOTE — Progress Notes (Signed)
Addendum: Reviewed and agree with assessment and management plan.  Overall concerning constellation of signs and symptoms without clear diagnosis Elevated chromogranin a level to be repeated Agree with consideration of a PET dotatate scan I would have him seen by hematology oncology for their opinion despite no definitive cancer diagnosis Mosetta Ferdinand, Lajuan Lines, MD

## 2021-12-09 NOTE — Progress Notes (Signed)
Richard Gates, please contact the patient tomorrow (not today as he is currently at another appointment at this time) to facilitate a heme/oncology consult at [facility]  Please see if Dr. Marin Olp or one of his partners in the [facility] office could see Richard Gates, he does not want to go back and forth to [facility] for all of his medical appointments.  Heme-onc referral for 30 pound weight loss in the past 6-1/2 months with an elevated chromogranin a level.  Thank you

## 2021-12-10 ENCOUNTER — Ambulatory Visit: Payer: 59 | Admitting: Internal Medicine

## 2021-12-10 ENCOUNTER — Other Ambulatory Visit (HOSPITAL_BASED_OUTPATIENT_CLINIC_OR_DEPARTMENT_OTHER): Payer: Self-pay

## 2021-12-10 ENCOUNTER — Other Ambulatory Visit: Payer: Self-pay

## 2021-12-10 ENCOUNTER — Telehealth: Payer: Self-pay

## 2021-12-10 ENCOUNTER — Other Ambulatory Visit (HOSPITAL_COMMUNITY): Payer: Self-pay

## 2021-12-10 DIAGNOSIS — E876 Hypokalemia: Secondary | ICD-10-CM

## 2021-12-10 DIAGNOSIS — R634 Abnormal weight loss: Secondary | ICD-10-CM

## 2021-12-10 DIAGNOSIS — R748 Abnormal levels of other serum enzymes: Secondary | ICD-10-CM

## 2021-12-10 LAB — CHROMOGRANIN A: Chromogranin A (ng/mL): 128 ng/mL — ABNORMAL HIGH (ref 0.0–101.8)

## 2021-12-10 MED ORDER — POTASSIUM CHLORIDE CRYS ER 20 MEQ PO TBCR: 20.0000 meq | EXTENDED_RELEASE_TABLET | Freq: Every day | ORAL | 1 refills | Status: DC

## 2021-12-10 NOTE — Telephone Encounter (Signed)
-----   Message from Noralyn Pick, NP sent at 12/09/2021  3:27 PM EDT -----    ----- Message ----- From: Jerene Bears, MD Sent: 12/09/2021   1:21 PM EDT To: Noralyn Pick, NP     ----- Message ----- From: Noralyn Pick, NP Sent: 12/08/2021   5:06 PM EDT To: Jerene Bears, MD  Dr. Hilarie Fredrickson, a lot to sort through.  He continues to lose weight, has recurrent diarrhea without a definitive diagnosis.  Please review my plan and I welcome any further recommendations as I am very concerned about Richard Gates.

## 2021-12-10 NOTE — Telephone Encounter (Signed)
-----   Message from Noralyn Pick, NP sent at 12/09/2021  3:27 PM EDT -----    ----- Message ----- From: Jerene Bears, MD Sent: 12/09/2021   1:21 PM EDT To: Noralyn Pick, NP     ----- Message ----- From: Noralyn Pick, NP Sent: 12/08/2021   5:06 PM EDT To: Jerene Bears, MD  Dr. Hilarie Fredrickson, a lot to sort through.  He continues to lose weight, has recurrent diarrhea without a definitive diagnosis.  Please review my plan and I welcome any further recommendations as I am very concerned about Mr. Toy Cookey.

## 2021-12-10 NOTE — Telephone Encounter (Signed)
Referral was placed to Heme/oncology consult at [facility] to see D. Ennever or one of his partners in the [employer]: Pt was made aware: Pt notified if he has not heard anything back from there office in 2 weeks please notify us: Pt verbalized understanding with all questions answered.

## 2021-12-14 ENCOUNTER — Ambulatory Visit (INDEPENDENT_AMBULATORY_CARE_PROVIDER_SITE_OTHER): Payer: 59 | Admitting: Internal Medicine

## 2021-12-14 ENCOUNTER — Encounter: Payer: Self-pay | Admitting: Internal Medicine

## 2021-12-14 ENCOUNTER — Other Ambulatory Visit (HOSPITAL_BASED_OUTPATIENT_CLINIC_OR_DEPARTMENT_OTHER): Payer: Self-pay

## 2021-12-14 ENCOUNTER — Inpatient Hospital Stay: Payer: 59 | Attending: Hematology & Oncology

## 2021-12-14 ENCOUNTER — Encounter: Payer: Self-pay | Admitting: Family

## 2021-12-14 ENCOUNTER — Inpatient Hospital Stay (HOSPITAL_BASED_OUTPATIENT_CLINIC_OR_DEPARTMENT_OTHER): Payer: 59 | Admitting: Family

## 2021-12-14 VITALS — BP 138/84 | HR 95 | Temp 98.4°F | Resp 17 | Wt 142.0 lb

## 2021-12-14 VITALS — BP 116/68 | HR 94 | Temp 98.1°F | Resp 16 | Ht 66.0 in | Wt 142.1 lb

## 2021-12-14 DIAGNOSIS — R634 Abnormal weight loss: Secondary | ICD-10-CM | POA: Diagnosis not present

## 2021-12-14 DIAGNOSIS — R5383 Other fatigue: Secondary | ICD-10-CM

## 2021-12-14 DIAGNOSIS — R978 Other abnormal tumor markers: Secondary | ICD-10-CM | POA: Insufficient documentation

## 2021-12-14 DIAGNOSIS — D649 Anemia, unspecified: Secondary | ICD-10-CM

## 2021-12-14 DIAGNOSIS — R197 Diarrhea, unspecified: Secondary | ICD-10-CM | POA: Insufficient documentation

## 2021-12-14 DIAGNOSIS — R531 Weakness: Secondary | ICD-10-CM | POA: Insufficient documentation

## 2021-12-14 DIAGNOSIS — E878 Other disorders of electrolyte and fluid balance, not elsewhere classified: Secondary | ICD-10-CM | POA: Diagnosis not present

## 2021-12-14 LAB — CBC WITH DIFFERENTIAL (CANCER CENTER ONLY)
Abs Immature Granulocytes: 0.03 10*3/uL (ref 0.00–0.07)
Basophils Absolute: 0 10*3/uL (ref 0.0–0.1)
Basophils Relative: 1 %
Eosinophils Absolute: 0 10*3/uL (ref 0.0–0.5)
Eosinophils Relative: 1 %
HCT: 31.3 % — ABNORMAL LOW (ref 39.0–52.0)
Hemoglobin: 10.7 g/dL — ABNORMAL LOW (ref 13.0–17.0)
Immature Granulocytes: 1 %
Lymphocytes Relative: 21 %
Lymphs Abs: 1.4 10*3/uL (ref 0.7–4.0)
MCH: 32.8 pg (ref 26.0–34.0)
MCHC: 34.2 g/dL (ref 30.0–36.0)
MCV: 96 fL (ref 80.0–100.0)
Monocytes Absolute: 0.6 10*3/uL (ref 0.1–1.0)
Monocytes Relative: 10 %
Neutro Abs: 4.3 10*3/uL (ref 1.7–7.7)
Neutrophils Relative %: 66 %
Platelet Count: 341 10*3/uL (ref 150–400)
RBC: 3.26 MIL/uL — ABNORMAL LOW (ref 4.22–5.81)
RDW: 13.5 % (ref 11.5–15.5)
WBC Count: 6.4 10*3/uL (ref 4.0–10.5)
nRBC: 0 % (ref 0.0–0.2)

## 2021-12-14 LAB — CMP (CANCER CENTER ONLY)
ALT: 14 U/L (ref 0–44)
AST: 27 U/L (ref 15–41)
Albumin: 3.8 g/dL (ref 3.5–5.0)
Alkaline Phosphatase: 120 U/L (ref 38–126)
Anion gap: 12 (ref 5–15)
BUN: 12 mg/dL (ref 8–23)
CO2: 24 mmol/L (ref 22–32)
Calcium: 9.2 mg/dL (ref 8.9–10.3)
Chloride: 101 mmol/L (ref 98–111)
Creatinine: 1.3 mg/dL — ABNORMAL HIGH (ref 0.61–1.24)
GFR, Estimated: >60 mL/min (ref >60)
Glucose, Bld: 107 mg/dL — ABNORMAL HIGH (ref 70–99)
Potassium: 4.2 mmol/L (ref 3.5–5.1)
Sodium: 137 mmol/L (ref 135–145)
Total Bilirubin: 0.9 mg/dL (ref 0.3–1.2)
Total Protein: 7.4 g/dL (ref 6.5–8.1)

## 2021-12-14 LAB — LACTATE DEHYDROGENASE: LDH: 151 U/L (ref 98–192)

## 2021-12-14 LAB — FERRITIN: Ferritin: 1186 ng/mL — ABNORMAL HIGH (ref 24–336)

## 2021-12-14 NOTE — Progress Notes (Unsigned)
Hematology and Oncology Follow Up Visit  Richard Gates [id number] Aug 16, 1960 61 y.o. 12/14/2021   Principle Diagnosis:  Iron deficiency Weight loss   Current Therapy:   Work-up pending   Interim History:  Richard Gates is here today for follow-up   ECOG Performance Status: {CHL ONC ECOG DZ:[id number]}  Medications:  Allergies as of 12/14/2021   No Known Allergies      Medication List        Accurate as of December 14, 2021  2:42 PM. If you have any questions, ask your nurse or doctor.          albuterol 108 (90 Base) MCG/ACT inhaler Commonly known as: VENTOLIN HFA Inhale 2 puffs into the lungs every 6 (six) hours as needed for wheezing or shortness of breath.   amLODipine 5 MG tablet Commonly known as: NORVASC Take 1 tablet (5 mg total) by mouth daily.   calcium carbonate 600 MG Tabs tablet Commonly known as: OS-CAL Take 2 tablets (1,200 mg total) by mouth 2 (two) times daily with a meal.   cyanocobalamin 1000 MCG tablet Commonly known as: VITAMIN B12 Take 1 tablet (1,000 mcg total) by mouth daily.   diphenoxylate-atropine 2.5-0.025 MG tablet Commonly known as: LOMOTIL Take 1 tablet by mouth in the morning and at bedtime.   fluticasone-salmeterol 250-50 MCG/ACT Aepb Commonly known as: ADVAIR Inhale 1 puff into the lungs in the morning and at bedtime.   folic acid 1 MG tablet Commonly known as: FOLVITE Take 1 tablet (1 mg total) by mouth daily.   magnesium oxide 400 (240 Mg) MG tablet Commonly known as: MAG-OX Take 2 tablets by mouth daily.   potassium chloride SA 20 MEQ tablet Commonly known as: KLOR-CON M Take 2 tablets (40 mEq total) by mouth daily.   potassium chloride SA 20 MEQ tablet Commonly known as: KLOR-CON M Take 1 tablet (20 mEq total) by mouth daily. Take two tablets once  today    and then after only take 1 tablet a day.   vitamin D3 25 MCG tablet Commonly known as: CHOLECALCIFEROL Take 1 tablet by mouth daily.         Allergies: No Known Allergies  Past Medical History, Surgical history, Social history, and Family History were reviewed and updated.  Review of Systems: All other 10 point review of systems is negative.   Physical Exam:  weight is 142 lb (64.4 kg). His oral temperature is 98.4 F (36.9 C). His blood pressure is 138/84 and his pulse is 95. His respiration is 17 and oxygen saturation is 98%.   Wt Readings from Last 3 Encounters:  12/14/21 142 lb (64.4 kg)  12/08/21 143 lb (64.9 kg)  11/11/21 146 lb 8 oz (66.5 kg)    Ocular: Sclerae unicteric, pupils equal, round and reactive to light Ear-nose-throat: Oropharynx clear, dentition fair Lymphatic: No cervical or supraclavicular adenopathy Lungs no rales or rhonchi, good excursion bilaterally Heart regular rate and rhythm, no murmur appreciated Abd soft, nontender, positive bowel sounds MSK no focal spinal tenderness, no joint edema Neuro: non-focal, well-oriented, appropriate affect Breasts: Deferred   Lab Results  Component Value Date   WBC 6.4 12/14/2021   HGB 10.7 (L) 12/14/2021   HCT 31.3 (L) 12/14/2021   MCV 96.0 12/14/2021   PLT 341 12/14/2021   Lab Results  Component Value Date   FERRITIN 1,361 (H) 11/06/2021   IRON 108 11/06/2021   TIBC 220 (L) 11/06/2021   UIBC 112 11/06/2021   IRONPCTSAT 49 (H)  11/06/2021   Lab Results  Component Value Date   RETICCTPCT 1.3 11/06/2021   RBC 3.26 (L) 12/14/2021   No results found for: "KPAFRELGTCHN", "LAMBDASER", "KAPLAMBRATIO" Lab Results  Component Value Date   IGGSERUM 990 10/01/2021   No results found for: "TOTALPROTELP", "ALBUMINELP", "A1GS", "A2GS", "BETS", "BETA2SER", "GAMS", "MSPIKE", "SPEI"   Chemistry      Component Value Date/Time   NA 138 12/08/2021 1542   K 3.1 (L) 12/08/2021 1542   CL 97 12/08/2021 1542   CO2 29 12/08/2021 1542   BUN 12 12/08/2021 1542   CREATININE 1.29 12/08/2021 1542   CREATININE 1.61 (H) 11/05/2021 1509      Component Value  Date/Time   CALCIUM 9.4 12/08/2021 1542   ALKPHOS 132 (H) 12/08/2021 1542   AST 29 12/08/2021 1542   AST 23 06/15/2021 1459   ALT 15 12/08/2021 1542   ALT 32 06/15/2021 1459   BILITOT 0.8 12/08/2021 1542   BILITOT 0.3 06/15/2021 1459       Impression and Plan:   Lottie Dawson, NP 8/15/20232:42 PM

## 2021-12-14 NOTE — Assessment & Plan Note (Addendum)
Electrolyte disturbance History of hypocalcemia, hypokalemia, hypomagnesemia.  Currently on supplements, last electrolytes satisfactory. Referral to nephrology has failed again, nephrology contact number provided.       Weight loss: Appetite is still poor, has dropped 4 additional pounds since last visit. Chronic diarrhea Saw GI 12/08/2021, they noted he had a colonoscopy 10/02/2021 with biopsies showing focal active colitis.  CT abdomen and pelvis show changes of colitis, had a elevated chromogranin a level. Was rx  Lomotil, C. difficile PCR, consider dotatate PET/CT to rule out neuroendocrine tumor. For the reported right chest lateral pain they ordered a chest CT without contrast. Was referred to heme-onc due to weight loss.  Seen today, note pending. Vitamin D and B12 deficiencies: Supplements. RTC 3 months

## 2021-12-14 NOTE — Progress Notes (Signed)
Subjective:    Patient ID: Richard Gates, male    DOB: 18-Dec-1960, 61 y.o.   MRN: [id number]  DOS:  12/14/2021 Type of visit - description: Follow-up  After the last visit here 11/11/2021, his potassium was 5.2, hemoglobin was increasing, magnesium was in the low side. Subsequently, labs were check 2 additional times, last potassium satisfactory, last creatinine slightly up at 1.30.  He also saw GI and hematology-oncology.  At this point I still see some weight loss compared to the last time he was here. Continue with watery diarrhea. Denies blood in the stools. No fever or chills. Did report mild soreness at the right lateral chest wall for few months.  Wt Readings from Last 3 Encounters:  12/14/21 142 lb 2 oz (64.5 kg)  12/14/21 142 lb (64.4 kg)  12/08/21 143 lb (64.9 kg)     Review of Systems See above   Past Medical History:  Diagnosis Date   COPD (chronic obstructive pulmonary disease) ([place])    Coronary artery calcification seen on CAT scan 08/2019   Coronary calcium score 103; short LM with<25% mixed calcific plaque (minimal); aortic atherosclerosis with normal size.  No dissection.  No aortic valve calcification   GERD (gastroesophageal reflux disease)    Hyperlipidemia    Hypertension     Past Surgical History:  Procedure Laterality Date   APPENDECTOMY     BIOPSY  10/02/2021   Procedure: BIOPSY;  Surgeon: Jerene Bears, MD;  Location: [facility] ENDOSCOPY;  Service: Gastroenterology;;   COLONOSCOPY  08/24/2016   COLONOSCOPY WITH PROPOFOL N/A 10/02/2021   Procedure: COLONOSCOPY WITH PROPOFOL;  Surgeon: Jerene Bears, MD;  Location: [facility];  Service: Gastroenterology;  Laterality: N/A;   ESOPHAGOGASTRODUODENOSCOPY (EGD) WITH PROPOFOL N/A 10/02/2021   Procedure: ESOPHAGOGASTRODUODENOSCOPY (EGD) WITH PROPOFOL;  Surgeon: Jerene Bears, MD;  Location: [facility] ENDOSCOPY;  Service: Gastroenterology;  Laterality: N/A;   POLYPECTOMY     TRANSTHORACIC ECHOCARDIOGRAM  04/2018   EF  65-70% (vigorous). No RWMA.  Gr 1 DD.  Normal valves.  (In setting of COPD exacerbation)    Current Outpatient Medications  Medication Instructions   albuterol (VENTOLIN HFA) 108 (90 Base) MCG/ACT inhaler 2 puffs, Inhalation, Every 6 hours PRN   amLODipine (NORVASC) 5 mg, Oral, Daily   calcium carbonate (OS-CAL) 1,200 mg, Oral, 2 times daily with meals   cyanocobalamin (VITAMIN B12) 1,000 mcg, Oral, Daily   diphenoxylate-atropine (LOMOTIL) 2.5-0.025 MG tablet 1 tablet, Oral, 2 times daily   fluticasone-salmeterol (ADVAIR) 250-50 MCG/ACT AEPB 1 puff, Inhalation, 2 times daily   folic acid (FOLVITE) 1 mg, Oral, Daily   magnesium oxide (MAG-OX) 400 (240 Mg) MG tablet Take 2 tablets by mouth daily.   potassium chloride SA (KLOR-CON M) 20 MEQ tablet 20 mEq, Oral, Daily, Take two tablets once  today    and then after only take 1 tablet a day.   vitamin D3 (CHOLECALCIFEROL) 25 MCG tablet Take 1 tablet by mouth daily.       Objective:   Physical Exam Abdominal:      BP 116/68   Pulse 94   Temp 98.1 F (36.7 C) (Oral)   Resp 16   Ht '5\' 6"'$  (1.676 m)   Wt 142 lb 2 oz (64.5 kg)   SpO2 94%   BMI 22.94 kg/m  General:   Well developed, NAD, BMI noted.  HEENT:  Normocephalic . Face symmetric, atraumatic Lymphatic system: No LAD is at the neck or axillary areas Lungs:  CTA B Normal respiratory effort, no intercostal retractions, no accessory muscle use. Heart: RRR,  no murmur.  Abdomen:  Not distended, soft, non-tender. No rebound or rigidity.   Skin: Not pale. Not jaundice Lower extremities: no pretibial edema bilaterally  Neurologic:  alert & oriented X3.  Speech normal, gait appropriate for age and unassisted Psych--  Cognition and judgment appear intact.  Cooperative with normal attention span and concentration.  Behavior appropriate. No anxious or depressed appearing.     Assessment     ASSESSMENT Hyperglycemia HTN GERD Hyperlipidemia, high TG Allergies , nasal   COPD, PFTs 2013 showed ratio of 58, FEV1 of 1.78- 52% without significant bronchodilator response, TLC was 77%   DLCO 78% Smoker: quit 2020 Aortic sclerosis per CT 05-2020 Coronary CTA, Ca+ Co score 103.0, L main artery, saw cards, probably still a low risk situation.  Plan is CV RF,  ASA "no clear data suggest benefit" per cardiology note 11/08/2019 DVT L leg dx  05-31-2021  PLAN: Electrolyte disturbance History of hypocalcemia, hypokalemia, hypomagnesemia.  Currently on supplements, last electrolytes satisfactory. Referral to nephrology has failed again, nephrology contact number provided.       Weight loss: Appetite is still poor, has dropped 4 additional pounds since last visit. Chronic diarrhea Saw GI 12/08/2021, they noted he had a colonoscopy 10/02/2021 with biopsies showing focal active colitis.  CT abdomen and pelvis show changes of colitis, had a elevated chromogranin a level. Was rx  Lomotil, C. difficile PCR, consider dotatate PET/CT to rule out neuroendocrine tumor. For the reported right chest lateral pain they ordered a chest CT without contrast. Was referred to heme-onc due to weight loss.  Seen today, note pending. Vitamin D and B12 deficiencies: Supplements. RTC 3 months

## 2021-12-14 NOTE — Patient Instructions (Addendum)
We  referred  you to the nephrologist (a doctor that specializes in low potassium) Please reach out to them at [phone]   Recommend to proceed with the following vaccines at your pharmacy:  Covid booster (bivalent) Flu shot this fall      [place], [place] back for checkup in 3 months

## 2021-12-15 ENCOUNTER — Other Ambulatory Visit: Payer: Self-pay | Admitting: Family

## 2021-12-15 ENCOUNTER — Telehealth: Payer: Self-pay | Admitting: Internal Medicine

## 2021-12-15 ENCOUNTER — Other Ambulatory Visit: Payer: 59

## 2021-12-15 DIAGNOSIS — D649 Anemia, unspecified: Secondary | ICD-10-CM

## 2021-12-15 DIAGNOSIS — K529 Noninfective gastroenteritis and colitis, unspecified: Secondary | ICD-10-CM | POA: Diagnosis not present

## 2021-12-15 DIAGNOSIS — R634 Abnormal weight loss: Secondary | ICD-10-CM

## 2021-12-15 DIAGNOSIS — R978 Other abnormal tumor markers: Secondary | ICD-10-CM

## 2021-12-15 DIAGNOSIS — R1011 Right upper quadrant pain: Secondary | ICD-10-CM

## 2021-12-15 DIAGNOSIS — R0781 Pleurodynia: Secondary | ICD-10-CM

## 2021-12-15 LAB — IRON AND IRON BINDING CAPACITY (CC-WL,HP ONLY)
Iron: 263 ug/dL — ABNORMAL HIGH (ref 45–182)
Saturation Ratios: 101 % — ABNORMAL HIGH (ref 17.9–39.5)
TIBC: 262 ug/dL (ref 250–450)
UIBC: UNDETERMINED ug/dL (ref 117–376)

## 2021-12-15 MED ORDER — AMLODIPINE BESYLATE 5 MG PO TABS: 5.0000 mg | ORAL_TABLET | Freq: Every day | ORAL | 1 refills | Status: DC

## 2021-12-15 MED ORDER — FOLIC ACID 1 MG PO TABS: 1.0000 mg | ORAL_TABLET | Freq: Every day | ORAL | 3 refills | Status: DC

## 2021-12-15 NOTE — Telephone Encounter (Signed)
Medication: amLODipine (NORVASC) 5 MG tablet [[id number]]   folic acid (FOLVITE) 1 MG tablet [[id number]]   Preferred Pharmacy (with phone number or street name): [facility], [place], [place] [address] [phone]  Agent: Please be advised that RX refills may take up to 3 business days. We ask that you follow-up with your pharmacy.

## 2021-12-15 NOTE — Telephone Encounter (Signed)
Rxs sent

## 2021-12-17 ENCOUNTER — Other Ambulatory Visit (HOSPITAL_COMMUNITY): Payer: Self-pay

## 2021-12-17 ENCOUNTER — Other Ambulatory Visit: Payer: Self-pay

## 2021-12-18 LAB — CLOSTRIDIUM DIFFICILE BY PCR: Toxigenic C. Difficile by PCR: NEGATIVE

## 2021-12-28 ENCOUNTER — Telehealth (HOSPITAL_BASED_OUTPATIENT_CLINIC_OR_DEPARTMENT_OTHER): Payer: Self-pay

## 2021-12-29 ENCOUNTER — Telehealth: Payer: Self-pay

## 2021-12-29 NOTE — Addendum Note (Signed)
Addended by: Gillermina Hu on: 12/29/2021 03:31 PM   Modules accepted: Orders

## 2021-12-29 NOTE — Telephone Encounter (Signed)
Message received from [facility] in pre certification dept stating that Pt CT and MRI needed to be reordered for pt to receive Imaging done at [facility] due to pt insurance is not going to cover Imaging done at [facility] facility: Radiology called and CT and MRI were canceled and Orders corrected in Epic  to be done at [place]:  Pt was made aware:  Pt verbalized understanding with all questions answered.

## 2021-12-30 ENCOUNTER — Other Ambulatory Visit (HOSPITAL_COMMUNITY): Payer: Self-pay

## 2022-01-01 ENCOUNTER — Ambulatory Visit (HOSPITAL_BASED_OUTPATIENT_CLINIC_OR_DEPARTMENT_OTHER): Payer: 59

## 2022-01-01 ENCOUNTER — Encounter (HOSPITAL_BASED_OUTPATIENT_CLINIC_OR_DEPARTMENT_OTHER): Payer: Self-pay

## 2022-01-11 ENCOUNTER — Ambulatory Visit: Payer: 59 | Admitting: Internal Medicine

## 2022-01-11 ENCOUNTER — Other Ambulatory Visit (HOSPITAL_COMMUNITY): Payer: Self-pay

## 2022-01-11 ENCOUNTER — Other Ambulatory Visit: Payer: Self-pay

## 2022-01-16 ENCOUNTER — Ambulatory Visit
Admission: RE | Admit: 2022-01-16 | Discharge: 2022-01-16 | Disposition: A | Discharge status: Home / Self Care | Payer: 59 | Source: Ambulatory Visit | Attending: Nurse Practitioner | Admitting: Nurse Practitioner

## 2022-01-16 DIAGNOSIS — D649 Anemia, unspecified: Secondary | ICD-10-CM

## 2022-01-16 DIAGNOSIS — R1011 Right upper quadrant pain: Secondary | ICD-10-CM

## 2022-01-16 DIAGNOSIS — K5730 Diverticulosis of large intestine without perforation or abscess without bleeding: Secondary | ICD-10-CM | POA: Diagnosis not present

## 2022-01-16 DIAGNOSIS — R0781 Pleurodynia: Secondary | ICD-10-CM

## 2022-01-16 DIAGNOSIS — K529 Noninfective gastroenteritis and colitis, unspecified: Secondary | ICD-10-CM

## 2022-01-16 MED ORDER — GADOBENATE DIMEGLUMINE 529 MG/ML IV SOLN
13.0000 mL | Freq: Once | INTRAVENOUS | Status: AC | PRN
Start: 2022-01-16 — End: 2022-01-16
  Administered 2022-01-16: 13 mL via INTRAVENOUS

## 2022-01-17 ENCOUNTER — Telehealth: Payer: Self-pay

## 2022-01-17 ENCOUNTER — Encounter: Payer: Self-pay | Admitting: Family

## 2022-01-17 ENCOUNTER — Other Ambulatory Visit: Payer: Self-pay

## 2022-01-17 ENCOUNTER — Inpatient Hospital Stay: Payer: 59 | Attending: Hematology & Oncology

## 2022-01-17 ENCOUNTER — Inpatient Hospital Stay (HOSPITAL_BASED_OUTPATIENT_CLINIC_OR_DEPARTMENT_OTHER): Payer: 59 | Admitting: Family

## 2022-01-17 VITALS — BP 138/71 | HR 83 | Temp 98.5°F | Resp 18 | Ht 66.0 in | Wt 144.0 lb

## 2022-01-17 DIAGNOSIS — D649 Anemia, unspecified: Secondary | ICD-10-CM | POA: Diagnosis not present

## 2022-01-17 DIAGNOSIS — D509 Iron deficiency anemia, unspecified: Secondary | ICD-10-CM | POA: Diagnosis not present

## 2022-01-17 DIAGNOSIS — E876 Hypokalemia: Secondary | ICD-10-CM

## 2022-01-17 DIAGNOSIS — R5383 Other fatigue: Secondary | ICD-10-CM

## 2022-01-17 DIAGNOSIS — R978 Other abnormal tumor markers: Secondary | ICD-10-CM

## 2022-01-17 DIAGNOSIS — R634 Abnormal weight loss: Secondary | ICD-10-CM

## 2022-01-17 LAB — CBC WITH DIFFERENTIAL (CANCER CENTER ONLY)
Abs Immature Granulocytes: 0.01 10*3/uL (ref 0.00–0.07)
Basophils Absolute: 0 10*3/uL (ref 0.0–0.1)
Basophils Relative: 1 %
Eosinophils Absolute: 0 10*3/uL (ref 0.0–0.5)
Eosinophils Relative: 0 %
HCT: 32.8 % — ABNORMAL LOW (ref 39.0–52.0)
Hemoglobin: 11.2 g/dL — ABNORMAL LOW (ref 13.0–17.0)
Immature Granulocytes: 0 %
Lymphocytes Relative: 23 %
Lymphs Abs: 1.1 10*3/uL (ref 0.7–4.0)
MCH: 32.8 pg (ref 26.0–34.0)
MCHC: 34.1 g/dL (ref 30.0–36.0)
MCV: 96.2 fL (ref 80.0–100.0)
Monocytes Absolute: 0.6 10*3/uL (ref 0.1–1.0)
Monocytes Relative: 12 %
Neutro Abs: 3.1 10*3/uL (ref 1.7–7.7)
Neutrophils Relative %: 64 %
Platelet Count: 361 10*3/uL (ref 150–400)
RBC: 3.41 MIL/uL — ABNORMAL LOW (ref 4.22–5.81)
RDW: 13.2 % (ref 11.5–15.5)
WBC Count: 4.8 10*3/uL (ref 4.0–10.5)
nRBC: 0 % (ref 0.0–0.2)

## 2022-01-17 LAB — CMP (CANCER CENTER ONLY)
ALT: 22 U/L (ref 0–44)
AST: 38 U/L (ref 15–41)
Albumin: 3.6 g/dL (ref 3.5–5.0)
Alkaline Phosphatase: 80 U/L (ref 38–126)
Anion gap: 6 (ref 5–15)
BUN: 16 mg/dL (ref 8–23)
CO2: 27 mmol/L (ref 22–32)
Calcium: 8.4 mg/dL — ABNORMAL LOW (ref 8.9–10.3)
Chloride: 104 mmol/L (ref 98–111)
Creatinine: 0.83 mg/dL (ref 0.61–1.24)
GFR, Estimated: >60 mL/min (ref >60)
Glucose, Bld: 96 mg/dL (ref 70–99)
Potassium: 3.7 mmol/L (ref 3.5–5.1)
Sodium: 137 mmol/L (ref 135–145)
Total Bilirubin: 0.4 mg/dL (ref 0.3–1.2)
Total Protein: 6.7 g/dL (ref 6.5–8.1)

## 2022-01-17 LAB — LACTATE DEHYDROGENASE: LDH: 155 U/L (ref 98–192)

## 2022-01-17 MED ORDER — POTASSIUM CHLORIDE CRYS ER 20 MEQ PO TBCR: 20.0000 meq | EXTENDED_RELEASE_TABLET | Freq: Every day | ORAL | 0 refills | Status: DC

## 2022-01-17 MED ORDER — ALBUTEROL SULFATE HFA 108 (90 BASE) MCG/ACT IN AERS: 2.0000 | INHALATION_SPRAY | Freq: Four times a day (QID) | RESPIRATORY_TRACT | 5 refills | Status: DC | PRN

## 2022-01-17 NOTE — Progress Notes (Signed)
Hematology and Oncology Follow Up Visit  Richard Gates [id number] [birth year] 61 y.o. 01/17/2022   Principle Diagnosis:  Ferratin elevated in past, hemochromatosis DNA negative Elevated Chromogranin A  Current Therapy: Observation   Interim History:  Richard Gates is here today for follow-up. He is doing fairly well. He notes that his appetite and energy seem to be a bit better. His weight is up 2 lbs.  He still has not scheduled his PET scan but it has been authorized. Scheduling will contact him and set this up.  Chromogranin A in August was 128. Today's result is pending.  He denies any issue with infection. No fever, chills, n/v, cough, rash, dizziness, SOB, chest pain, palpitations, abdominal pain or changes in bowel or bladder habits.  He continues to have diarrhea and had an MRI of the abdomen with GI yesterday. The results are pending.  No blood loss noted. No bruising or petechiae.  No swelling, tenderness, numbness or tingling in his extremities at this time.  No falls or syncope.  He feels that he is doing his best to stay well hydrated.   ECOG Performance Status: 1 - Symptomatic but completely ambulatory  Medications:  Allergies as of 01/17/2022   No Known Allergies      Medication List        Accurate as of January 17, 2022  2:02 PM. If you have any questions, ask your nurse or doctor.          albuterol 108 (90 Base) MCG/ACT inhaler Commonly known as: VENTOLIN HFA Inhale 2 puffs into the lungs every 6 (six) hours as needed for wheezing or shortness of breath.   amLODipine 5 MG tablet Commonly known as: NORVASC Take 1 tablet (5 mg total) by mouth daily.   calcium carbonate 600 MG Tabs tablet Commonly known as: OS-CAL Take 2 tablets (1,200 mg total) by mouth 2 (two) times daily with a meal.   cholecalciferol 25 MCG (1000 UNIT) tablet Commonly known as: VITAMIN D3 Take 1 tablet by mouth daily.   cyanocobalamin 1000 MCG tablet Commonly known as:  VITAMIN B12 Take 1 tablet (1,000 mcg total) by mouth daily.   diphenoxylate-atropine 2.5-0.025 MG tablet Commonly known as: LOMOTIL Take 1 tablet by mouth in the morning and at bedtime.   fluticasone-salmeterol 250-50 MCG/ACT Aepb Commonly known as: ADVAIR Inhale 1 puff into the lungs in the morning and at bedtime.   folic acid 1 MG tablet Commonly known as: FOLVITE Take 1 tablet (1 mg total) by mouth daily.   magnesium oxide 400 (240 Mg) MG tablet Commonly known as: MAG-OX Take 2 tablets by mouth daily.   potassium chloride SA 20 MEQ tablet Commonly known as: KLOR-CON M Take 1 tablet (20 mEq total) by mouth daily. What changed: additional instructions Changed by: Valentino Nose, CMA        Allergies: No Known Allergies  Past Medical History, Surgical history, Social history, and Family History were reviewed and updated.  Review of Systems: All other 10 point review of systems is negative.   Physical Exam:  height is 5\' 6"  (1.676 m) and weight is 144 lb (65.3 kg). His oral temperature is 98.5 F (36.9 C). His blood pressure is 138/71 and his pulse is 83. His respiration is 18 and oxygen saturation is 98%.   Wt Readings from Last 3 Encounters:  01/17/22 144 lb (65.3 kg)  12/14/21 142 lb 2 oz (64.5 kg)  12/14/21 142 lb (64.4 kg)    Ocular:  Sclerae unicteric, pupils equal, round and reactive to light Ear-nose-throat: Oropharynx clear, dentition fair Lymphatic: No cervical or supraclavicular adenopathy Lungs no rales or rhonchi, good excursion bilaterally Heart regular rate and rhythm, no murmur appreciated Abd soft, nontender, positive bowel sounds MSK no focal spinal tenderness, no joint edema Neuro: non-focal, well-oriented, appropriate affect Breasts: Deferred   Lab Results  Component Value Date   WBC 4.8 01/17/2022   HGB 11.2 (L) 01/17/2022   HCT 32.8 (L) 01/17/2022   MCV 96.2 01/17/2022   PLT 361 01/17/2022   Lab Results  Component Value Date    FERRITIN 1,186 (H) 12/14/2021   IRON 263 (H) 12/14/2021   TIBC 262 12/14/2021   UIBC UNABLE TO CALCULATE 12/14/2021   IRONPCTSAT 101 (H) 12/14/2021   Lab Results  Component Value Date   RETICCTPCT 1.3 11/06/2021   RBC 3.41 (L) 01/17/2022   No results found for: "KPAFRELGTCHN", "LAMBDASER", "KAPLAMBRATIO" Lab Results  Component Value Date   IGGSERUM 990 10/01/2021   No results found for: "TOTALPROTELP", "ALBUMINELP", "A1GS", "A2GS", "BETS", "BETA2SER", "GAMS", "MSPIKE", "SPEI"   Chemistry      Component Value Date/Time   NA 137 12/14/2021 1416   K 4.2 12/14/2021 1416   CL 101 12/14/2021 1416   CO2 24 12/14/2021 1416   BUN 12 12/14/2021 1416   CREATININE 1.30 (H) 12/14/2021 1416   CREATININE 1.61 (H) 11/05/2021 1509      Component Value Date/Time   CALCIUM 9.2 12/14/2021 1416   ALKPHOS 120 12/14/2021 1416   AST 27 12/14/2021 1416   ALT 14 12/14/2021 1416   BILITOT 0.9 12/14/2021 1416       Impression and Plan: Richard Gates is a very pleasant 61 yo gentleman with IDA and elevated chromogranin A.  We are working to get him scheduled for dotatate PET scan.  Chromogranin A level pending.  Follow-up in 8 weeks.   Eileen Stanford, NP 9/18/20232:02 PM

## 2022-01-17 NOTE — Telephone Encounter (Signed)
Rxs sent

## 2022-01-17 NOTE — Telephone Encounter (Signed)
Nurse Assessment Nurse: Clovis Riley RN, Georgina Peer Date/Time (Eastern Time): 01/15/2022 3:09:12 PM Confirm and document reason for call. If symptomatic, describe symptoms. ---Caller states he is out of his K+ and albuterol. states he is slightly short of breath. States he ran out of his medicine on thursday. Hx of copd. Does the patient have any new or worsening symptoms? ---Yes Will a triage be completed? ---Yes Related visit to physician within the last 2 weeks? ---No Does the PT have any chronic conditions? (i.e. diabetes, asthma, this includes High risk factors for pregnancy, etc.) ---Yes List chronic conditions. ---copd Is this a behavioral health or substance abuse call? ---No Guidelines Guideline Title Affirmed Question Affirmed Notes Nurse Date/Time (Eastern Time) Asthma Attack [1] MODERATE asthma attack (e.g., SOB at rest, speaks in phrases, audible wheezes) AND [2] doesn't have neb or inhaler available Otelia Santee 01/15/2022 3:12:32 PM PLEASE NOTE: All timestamps contained within this report are represented as [place] Standard Time. CONFIDENTIALTY NOTICE: This fax transmission is intended only for the addressee. It contains information that is legally privileged, confidential or otherwise protected from use or disclosure. If you are not the intended recipient, you are strictly prohibited from reviewing, disclosing, copying using or disseminating any of this information or taking any action in reliance on or regarding this information. If you have received this fax in error, please notify us immediately by telephone so that we can arrange for its return to [place]. Phone: [phone], Toll-Free: [phone], Fax: [phone] Page: 2 of 2 Call Id: [id number] [place]. Time Eilene Ghazi Time) Disposition Final User 01/15/2022 3:07:37 PM Send to Urgent Queue Rica Mote 01/15/2022 3:14:15 PM Go to ED Now Yes Clovis Riley, RN, Georgina Peer Final Disposition 01/15/2022 3:14:15 PM Go to ED Now Yes  Clovis Riley, RN, Leilani Merl Disagree/Comply Disagree Caller Understands Yes PreDisposition [place] Advice Given Per Guideline GO TO ED NOW: * You need to be seen in the Emergency Department. * Go to the ED at ___________ [place] now. Drive carefully. NOTE TO TRIAGER - DRIVING: * Another adult should drive. * Patient should not delay going to the emergency department. CARE ADVICE given per Asthma Attack (Adult) guideline. ANOTHER ADULT SHOULD DRIVE: * It is better and safer if another adult drives instead of you. Referrals GO TO FACILITY REFUSED

## 2022-01-19 DIAGNOSIS — I129 Hypertensive chronic kidney disease with stage 1 through stage 4 chronic kidney disease, or unspecified chronic kidney disease: Secondary | ICD-10-CM | POA: Diagnosis not present

## 2022-01-19 DIAGNOSIS — K529 Noninfective gastroenteritis and colitis, unspecified: Secondary | ICD-10-CM | POA: Diagnosis not present

## 2022-01-19 DIAGNOSIS — E538 Deficiency of other specified B group vitamins: Secondary | ICD-10-CM | POA: Diagnosis not present

## 2022-01-19 DIAGNOSIS — E559 Vitamin D deficiency, unspecified: Secondary | ICD-10-CM | POA: Diagnosis not present

## 2022-01-19 DIAGNOSIS — N182 Chronic kidney disease, stage 2 (mild): Secondary | ICD-10-CM | POA: Diagnosis not present

## 2022-01-19 DIAGNOSIS — J449 Chronic obstructive pulmonary disease, unspecified: Secondary | ICD-10-CM | POA: Diagnosis not present

## 2022-01-19 DIAGNOSIS — D631 Anemia in chronic kidney disease: Secondary | ICD-10-CM | POA: Diagnosis not present

## 2022-01-19 LAB — BASIC METABOLIC PANEL
BUN: 11 (ref 4–21)
CO2: 24 — AB (ref 13–22)
Chloride: 102 (ref 99–108)
Creatinine: 1 (ref 0.6–1.3)
Glucose: 106
Potassium: 4.5 mEq/L (ref 3.5–5.1)
Sodium: 140 (ref 137–147)

## 2022-01-19 LAB — CBC AND DIFFERENTIAL
HCT: 37 — AB (ref 41–53)
Hemoglobin: 12.2 — AB (ref 13.5–17.5)
Neutrophils Absolute: 5
Platelets: 361 10*3/uL (ref 150–400)
WBC: 6.6

## 2022-01-19 LAB — VITAMIN D 25 HYDROXY (VIT D DEFICIENCY, FRACTURES): Vit D, 25-Hydroxy: 39.3

## 2022-01-19 LAB — VITAMIN B12: Vitamin B-12: 591

## 2022-01-19 LAB — COMPREHENSIVE METABOLIC PANEL
Albumin: 4.4 (ref 3.5–5.0)
Calcium: 9.7 (ref 8.7–10.7)
eGFR: 91

## 2022-01-19 LAB — CBC: RBC: 3.73 — AB (ref 3.87–5.11)

## 2022-01-19 LAB — PROTEIN / CREATININE RATIO, URINE: Creatinine, Urine: 353.9

## 2022-01-19 LAB — CHROMOGRANIN A: Chromogranin A (ng/mL): 167.1 ng/mL — ABNORMAL HIGH (ref 0.0–101.8)

## 2022-01-24 ENCOUNTER — Other Ambulatory Visit: Payer: Self-pay | Admitting: Family

## 2022-01-24 ENCOUNTER — Encounter: Payer: Self-pay | Admitting: Internal Medicine

## 2022-01-24 DIAGNOSIS — R978 Other abnormal tumor markers: Secondary | ICD-10-CM

## 2022-01-24 DIAGNOSIS — R634 Abnormal weight loss: Secondary | ICD-10-CM

## 2022-01-25 ENCOUNTER — Other Ambulatory Visit (HOSPITAL_COMMUNITY): Payer: Self-pay

## 2022-01-26 ENCOUNTER — Ambulatory Visit
Admission: RE | Admit: 2022-01-26 | Discharge: 2022-01-26 | Disposition: A | Discharge status: Home / Self Care | Payer: 59 | Source: Ambulatory Visit | Attending: Nurse Practitioner | Admitting: Nurse Practitioner

## 2022-01-26 DIAGNOSIS — R911 Solitary pulmonary nodule: Secondary | ICD-10-CM | POA: Diagnosis not present

## 2022-01-26 DIAGNOSIS — I700 Atherosclerosis of aorta: Secondary | ICD-10-CM | POA: Diagnosis not present

## 2022-01-26 DIAGNOSIS — D649 Anemia, unspecified: Secondary | ICD-10-CM

## 2022-01-26 DIAGNOSIS — R1011 Right upper quadrant pain: Secondary | ICD-10-CM

## 2022-01-26 DIAGNOSIS — J439 Emphysema, unspecified: Secondary | ICD-10-CM | POA: Diagnosis not present

## 2022-01-26 DIAGNOSIS — R0781 Pleurodynia: Secondary | ICD-10-CM

## 2022-01-26 DIAGNOSIS — R079 Chest pain, unspecified: Secondary | ICD-10-CM | POA: Diagnosis not present

## 2022-01-26 DIAGNOSIS — K529 Noninfective gastroenteritis and colitis, unspecified: Secondary | ICD-10-CM

## 2022-01-27 ENCOUNTER — Other Ambulatory Visit: Payer: Self-pay

## 2022-01-27 ENCOUNTER — Other Ambulatory Visit: Payer: Self-pay | Admitting: Internal Medicine

## 2022-01-27 ENCOUNTER — Other Ambulatory Visit (HOSPITAL_BASED_OUTPATIENT_CLINIC_OR_DEPARTMENT_OTHER): Payer: Self-pay

## 2022-01-27 MED ORDER — VITAMIN B-12 1000 MCG PO TABS
1000.0000 ug | ORAL_TABLET | Freq: Every day | ORAL | 3 refills | Status: DC
  Filled 2022-01-27: qty 100, 100d supply, fill #0

## 2022-02-04 ENCOUNTER — Ambulatory Visit: Payer: 59 | Admitting: Internal Medicine

## 2022-02-07 ENCOUNTER — Encounter (HOSPITAL_COMMUNITY)
Admission: RE | Admit: 2022-02-07 | Discharge: 2022-02-07 | Disposition: A | Discharge status: Home / Self Care | Payer: 59 | Source: Ambulatory Visit | Attending: Family | Admitting: Family

## 2022-02-07 DIAGNOSIS — R978 Other abnormal tumor markers: Secondary | ICD-10-CM | POA: Diagnosis present

## 2022-02-07 DIAGNOSIS — R634 Abnormal weight loss: Secondary | ICD-10-CM | POA: Insufficient documentation

## 2022-02-07 DIAGNOSIS — R911 Solitary pulmonary nodule: Secondary | ICD-10-CM | POA: Insufficient documentation

## 2022-02-07 MED ORDER — COPPER CU 64 DOTATATE 1 MCI/ML IV SOLN
4.0000 | Freq: Once | INTRAVENOUS | Status: AC
  Administered 2022-02-07: 3.85 via INTRAVENOUS

## 2022-02-14 ENCOUNTER — Encounter: Payer: Self-pay | Admitting: *Deleted

## 2022-02-21 ENCOUNTER — Telehealth: Payer: Self-pay | Admitting: Internal Medicine

## 2022-02-21 MED ORDER — ALBUTEROL SULFATE HFA 108 (90 BASE) MCG/ACT IN AERS: 2.0000 | INHALATION_SPRAY | Freq: Four times a day (QID) | RESPIRATORY_TRACT | 5 refills | Status: DC | PRN

## 2022-02-21 NOTE — Telephone Encounter (Signed)
Medication: albuterol (VENTOLIN HFA) 108 (90 Base) MCG/ACT inhaler [[id number]]   Has the patient contacted their pharmacy? No. Pharmacy changed. Please update patient preferred pharmacy to location below.   Preferred Pharmacy (with phone number or street name): [facility] [address] [place] [address]  [phone]   Agent: Please be advised that RX refills may take up to 3 business days. We ask that you follow-up with your pharmacy.

## 2022-02-21 NOTE — Telephone Encounter (Signed)
Rx sent 

## 2022-02-23 ENCOUNTER — Other Ambulatory Visit (HOSPITAL_BASED_OUTPATIENT_CLINIC_OR_DEPARTMENT_OTHER): Payer: Self-pay

## 2022-03-18 ENCOUNTER — Ambulatory Visit (INDEPENDENT_AMBULATORY_CARE_PROVIDER_SITE_OTHER): Payer: PRIVATE HEALTH INSURANCE | Admitting: Internal Medicine

## 2022-03-18 ENCOUNTER — Encounter: Payer: Self-pay | Admitting: Internal Medicine

## 2022-03-18 VITALS — BP 142/80 | HR 100 | Temp 98.3°F | Resp 16 | Ht 66.0 in | Wt 149.2 lb

## 2022-03-18 DIAGNOSIS — R634 Abnormal weight loss: Secondary | ICD-10-CM

## 2022-03-18 DIAGNOSIS — E876 Hypokalemia: Secondary | ICD-10-CM

## 2022-03-18 DIAGNOSIS — E878 Other disorders of electrolyte and fluid balance, not elsewhere classified: Secondary | ICD-10-CM

## 2022-03-18 DIAGNOSIS — R911 Solitary pulmonary nodule: Secondary | ICD-10-CM

## 2022-03-18 LAB — BASIC METABOLIC PANEL
BUN: 16 mg/dL (ref 7–25)
CO2: 27 mmol/L (ref 20–32)
Calcium: 8.6 mg/dL (ref 8.6–10.3)
Chloride: 101 mmol/L (ref 98–110)
Creat: 0.96 mg/dL (ref 0.70–1.35)
Glucose, Bld: 101 mg/dL — ABNORMAL HIGH (ref 65–99)
Potassium: 3.3 mmol/L — ABNORMAL LOW (ref 3.5–5.3)
Sodium: 140 mmol/L (ref 135–146)

## 2022-03-18 LAB — MAGNESIUM: Magnesium: 1.3 mg/dL — ABNORMAL LOW (ref 1.5–2.5)

## 2022-03-18 NOTE — Patient Instructions (Signed)
Check the  blood pressure regularly BP GOAL is between 110/65 and  135/85. If it is consistently higher or lower, let me know      GO TO THE LAB : Get the blood work     GO TO THE FRONT DESK, PLEASE SCHEDULE YOUR APPOINTMENTS Come back for a physical exam in 4 months 

## 2022-03-18 NOTE — Progress Notes (Unsigned)
Subjective:    Patient ID: Richard Gates, male    DOB: 18-Nov-1960, 61 y.o.   MRN: [id number]  DOS:  03/18/2022 Type of visit - description: Follow-up  Since the last office visit is doing well. Saw oncology, PET scan done, results reviewed. Reports that appetite is okay. Diarrhea has decreased, on Imodium as needed. Weight has increased.   Wt Readings from Last 3 Encounters:  03/18/22 149 lb 4 oz (67.7 kg)  01/17/22 144 lb (65.3 kg)  12/14/21 142 lb 2 oz (64.5 kg)     Review of Systems See above   Past Medical History:  Diagnosis Date   COPD (chronic obstructive pulmonary disease) (HCC)    Coronary artery calcification seen on CAT scan 08/2019   Coronary calcium score 103; short LM with<25% mixed calcific plaque (minimal); aortic atherosclerosis with normal size.  No dissection.  No aortic valve calcification   GERD (gastroesophageal reflux disease)    Hyperlipidemia    Hypertension     Past Surgical History:  Procedure Laterality Date   APPENDECTOMY     BIOPSY  10/02/2021   Procedure: BIOPSY;  Surgeon: Jerene Bears, MD;  Location: [facility] ENDOSCOPY;  Service: Gastroenterology;;   COLONOSCOPY  08/24/2016   COLONOSCOPY WITH PROPOFOL N/A 10/02/2021   Procedure: COLONOSCOPY WITH PROPOFOL;  Surgeon: Jerene Bears, MD;  Location: [place];  Service: Gastroenterology;  Laterality: N/A;   ESOPHAGOGASTRODUODENOSCOPY (EGD) WITH PROPOFOL N/A 10/02/2021   Procedure: ESOPHAGOGASTRODUODENOSCOPY (EGD) WITH PROPOFOL;  Surgeon: Jerene Bears, MD;  Location: [facility] ENDOSCOPY;  Service: Gastroenterology;  Laterality: N/A;   POLYPECTOMY     TRANSTHORACIC ECHOCARDIOGRAM  04/2018   EF 65-70% (vigorous). No RWMA.  Gr 1 DD.  Normal valves.  (In setting of COPD exacerbation)    Current Outpatient Medications  Medication Instructions   albuterol (VENTOLIN HFA) 108 (90 Base) MCG/ACT inhaler 2 puffs, Inhalation, Every 6 hours PRN   amLODipine (NORVASC) 5 mg, Oral, Daily   calcium carbonate  (OSCAL) 1500 (600 Ca) MG TABS tablet Take 2 tablets (1,200 mg total) by mouth 2 (two) times daily with a meal.   cyanocobalamin (VITAMIN B12) 1,000 mcg, Oral, Daily   diphenoxylate-atropine (LOMOTIL) 2.5-0.025 MG tablet 1 tablet, Oral, 2 times daily   fluticasone-salmeterol (ADVAIR) 250-50 MCG/ACT AEPB 1 puff, Inhalation, 2 times daily   folic acid (FOLVITE) 1 mg, Oral, Daily   magnesium oxide (MAG-OX) 400 (240 Mg) MG tablet Take 2 tablets by mouth daily.   potassium chloride SA (KLOR-CON M) 20 MEQ tablet 20 mEq, Oral, Daily       Objective:   Physical Exam BP (!) 144/84   Pulse (!) 109   Temp 98.3 F (36.8 C) (Oral)   Resp 16   Ht '5\' 6"'$  (1.676 m)   Wt 149 lb 4 oz (67.7 kg)   SpO2 96%   BMI 24.09 kg/m  General:   Well developed, NAD, BMI noted. HEENT:  Normocephalic . Face symmetric, atraumatic Lungs:  CTA B Normal respiratory effort, no intercostal retractions, no accessory muscle use. Heart: RRR,  no murmur.  Lower extremities: no pretibial edema bilaterally  Skin: Not pale. Not jaundice Neurologic:  alert & oriented X3.  Speech normal, gait appropriate for age and unassisted Psych--  Cognition and judgment appear intact.  Cooperative with normal attention span and concentration.  Behavior appropriate. No anxious or depressed appearing.      Assessment     ASSESSMENT Hyperglycemia HTN GERD Hyperlipidemia, high TG Allergies , nasal  COPD, PFTs 2013 showed ratio of 58, FEV1 of 1.78- 52% without significant bronchodilator response, TLC was 77%   DLCO 78% Smoker: quit 2020 Aortic sclerosis per CT 05-2020 Coronary CTA, Ca+ Co score 103.0, L main artery, saw cards, probably still a low risk situation.  Plan is CV RF control, see cards note 11/08/2019 DVT L leg dx  05-31-2021 Pulmonary nodule.  Incidentally found PET scan 12-2021.  CT needed 1 year   PLAN: Electrolyte disturbance: Last electrolytes were normal, now that his diarrhea has substantially decreased I  wonder if he is still needs potassium and magnesium supplements.  Check a BMP and magnesium Weight loss: Improving.  Appetite is much better. Chronic diarrhea:  Follow-up by GI oncology due to elevated chromogranin level. A PET scan showed: No well differentiated neuroendocrine tumor identified on skull base to thigh DOTATATE PET scan. No bowel abnormality or liver metastasis. No mesenteric nodularity. Also a LEFT upper lobe pulmonary nodule. Recommend follow-up CT in 12 months as prescribed on CT 01/26/2022. Pulmonary nodule: See above, incidental finding, recommend CT follow-up 01-2023 Preventive care: Had a flu and COVID-vaccine. RTC CPX 4 months

## 2022-03-19 NOTE — Assessment & Plan Note (Signed)
Electrolyte disturbance: Last electrolytes were normal, now that his diarrhea has substantially decreased I wonder if he is still needs potassium and magnesium supplements.  Check a BMP and magnesium Weight loss: Improving.  Appetite is much better. Chronic diarrhea:  Follow-up by GI and oncology due to elevated chromogranin level. A PET scan showed:  -No well differentiated neuroendocrine tumor identified on skull base to thigh  -. No bowel abnormality or liver metastasis.  -No mesenteric nodularity.  - LEFT upper lobe pulmonary nodule. Recommend follow-up CT in 12 months   Pulmonary nodule: See above, incidental finding, recommend CT follow-up 01-2023 Preventive care: Had a flu and COVID-vaccine. RTC CPX 4 months

## 2022-03-21 ENCOUNTER — Inpatient Hospital Stay: Payer: PRIVATE HEALTH INSURANCE | Attending: Hematology & Oncology

## 2022-03-21 ENCOUNTER — Inpatient Hospital Stay (HOSPITAL_BASED_OUTPATIENT_CLINIC_OR_DEPARTMENT_OTHER): Payer: PRIVATE HEALTH INSURANCE | Admitting: Hematology & Oncology

## 2022-03-21 ENCOUNTER — Other Ambulatory Visit (HOSPITAL_COMMUNITY): Payer: Self-pay

## 2022-03-21 ENCOUNTER — Other Ambulatory Visit: Payer: Self-pay

## 2022-03-21 ENCOUNTER — Encounter: Payer: Self-pay | Admitting: Hematology & Oncology

## 2022-03-21 VITALS — BP 159/89 | HR 84 | Temp 98.7°F | Resp 17 | Ht 66.0 in | Wt 145.0 lb

## 2022-03-21 DIAGNOSIS — R978 Other abnormal tumor markers: Secondary | ICD-10-CM | POA: Insufficient documentation

## 2022-03-21 DIAGNOSIS — R7989 Other specified abnormal findings of blood chemistry: Secondary | ICD-10-CM | POA: Insufficient documentation

## 2022-03-21 DIAGNOSIS — K591 Functional diarrhea: Secondary | ICD-10-CM

## 2022-03-21 DIAGNOSIS — R748 Abnormal levels of other serum enzymes: Secondary | ICD-10-CM | POA: Diagnosis not present

## 2022-03-21 DIAGNOSIS — Z79899 Other long term (current) drug therapy: Secondary | ICD-10-CM | POA: Insufficient documentation

## 2022-03-21 DIAGNOSIS — D649 Anemia, unspecified: Secondary | ICD-10-CM

## 2022-03-21 DIAGNOSIS — R197 Diarrhea, unspecified: Secondary | ICD-10-CM | POA: Diagnosis not present

## 2022-03-21 LAB — CMP (CANCER CENTER ONLY)
ALT: 25 U/L (ref 0–44)
AST: 48 U/L — ABNORMAL HIGH (ref 15–41)
Albumin: 4.4 g/dL (ref 3.5–5.0)
Alkaline Phosphatase: 97 U/L (ref 38–126)
Anion gap: 13 (ref 5–15)
BUN: 15 mg/dL (ref 8–23)
CO2: 31 mmol/L (ref 22–32)
Calcium: 9.2 mg/dL (ref 8.9–10.3)
Chloride: 99 mmol/L (ref 98–111)
Creatinine: 0.97 mg/dL (ref 0.61–1.24)
GFR, Estimated: >60 mL/min (ref >60)
Glucose, Bld: 104 mg/dL — ABNORMAL HIGH (ref 70–99)
Potassium: 3.5 mmol/L (ref 3.5–5.1)
Sodium: 143 mmol/L (ref 135–145)
Total Bilirubin: 0.6 mg/dL (ref 0.3–1.2)
Total Protein: 7.5 g/dL (ref 6.5–8.1)

## 2022-03-21 LAB — CBC WITH DIFFERENTIAL (CANCER CENTER ONLY)
Abs Immature Granulocytes: 0.02 10*3/uL (ref 0.00–0.07)
Basophils Absolute: 0 10*3/uL (ref 0.0–0.1)
Basophils Relative: 0 %
Eosinophils Absolute: 0 10*3/uL (ref 0.0–0.5)
Eosinophils Relative: 0 %
HCT: 37.8 % — ABNORMAL LOW (ref 39.0–52.0)
Hemoglobin: 12.9 g/dL — ABNORMAL LOW (ref 13.0–17.0)
Immature Granulocytes: 0 %
Lymphocytes Relative: 25 %
Lymphs Abs: 1.8 10*3/uL (ref 0.7–4.0)
MCH: 32.8 pg (ref 26.0–34.0)
MCHC: 34.1 g/dL (ref 30.0–36.0)
MCV: 96.2 fL (ref 80.0–100.0)
Monocytes Absolute: 0.6 10*3/uL (ref 0.1–1.0)
Monocytes Relative: 8 %
Neutro Abs: 4.7 10*3/uL (ref 1.7–7.7)
Neutrophils Relative %: 67 %
Platelet Count: 365 10*3/uL (ref 150–400)
RBC: 3.93 MIL/uL — ABNORMAL LOW (ref 4.22–5.81)
RDW: 13 % (ref 11.5–15.5)
WBC Count: 7.2 10*3/uL (ref 4.0–10.5)
nRBC: 0 % (ref 0.0–0.2)

## 2022-03-21 LAB — RETICULOCYTES
Immature Retic Fract: 12.6 % (ref 2.3–15.9)
RBC.: 3.92 MIL/uL — ABNORMAL LOW (ref 4.22–5.81)
Retic Count, Absolute: 83.9 10*3/uL (ref 19.0–186.0)
Retic Ct Pct: 2.1 % (ref 0.4–3.1)

## 2022-03-21 LAB — LACTATE DEHYDROGENASE: LDH: 211 U/L — ABNORMAL HIGH (ref 98–192)

## 2022-03-21 MED ORDER — MAGNESIUM OXIDE -MG SUPPLEMENT 400 (240 MG) MG PO TABS: 1200.0000 mg | ORAL_TABLET | Freq: Every day | ORAL | 0 refills | Status: DC

## 2022-03-21 MED ORDER — POTASSIUM CHLORIDE CRYS ER 20 MEQ PO TBCR: 60.0000 meq | EXTENDED_RELEASE_TABLET | Freq: Every day | ORAL | 0 refills | Status: DC

## 2022-03-21 NOTE — Addendum Note (Signed)
Addended byDamita Dunnings D on: 03/21/2022 01:52 PM   Modules accepted: Orders

## 2022-03-21 NOTE — Progress Notes (Signed)
Hematology and Oncology Follow Up Visit  Richard Gates [id number] 10/19/1960 61 y.o. 03/21/2022   Principle Diagnosis:  Ferratin elevated in past, hemochromatosis DNA negative Elevated Chromogranin A  Current Therapy: Observation   Interim History:  Richard Gates is here today for follow-up.  This is the first time seeing him.  He is very nice.  He is a 61 year old African-American male.  He has been seen Judson Roch in our office.  He has a very interesting history.  He has an elevated Chromogranin A level.  When he was last seen back in September the chromogranin A level was 167.  We did do a nuclear medicine Dotatate scan on him.  This was done on 02/07/2022.  This did not show any evidence of active neuroendocrine tumor that was dotatate positive.  Was also interesting as has a incredibly elevated ferritin.  Back in 12/14/2021, his ferritin was 1186 with an iron saturation of under 1%.  The checked for Hemochromatosis.  This has been negative.  We will be interesting to see what the iron levels are today.  He says his diarrhea is better.  Says he may go couple times a day.  His family doctor-Dr. Larose Kells -has been monitoring him.  He has had electrolyte abnormalities.  He actually has been admitted with electrolyte deficiencies.  He is on potassium and magnesium right now.  He works for [employer].  He drives a forklift for them.  He does not smoke.  He has had no problems with nausea or vomiting.  He has had no issues with fever.  There is been no problems with COVID.  Overall, I would say his performance status is probably ECOG 1.   Medications:  Allergies as of 03/21/2022   No Known Allergies      Medication List        Accurate as of March 21, 2022  3:23 PM. If you have any questions, ask your nurse or doctor.          albuterol 108 (90 Base) MCG/ACT inhaler Commonly known as: VENTOLIN HFA Inhale 2 puffs into the lungs every 6 (six) hours as needed for  wheezing or shortness of breath.   amLODipine 5 MG tablet Commonly known as: NORVASC Take 1 tablet (5 mg total) by mouth daily.   calcium carbonate 1500 (600 Ca) MG Tabs tablet Commonly known as: OSCAL Take 2 tablets (1,200 mg total) by mouth 2 (two) times daily with a meal.   cyanocobalamin 1000 MCG tablet Commonly known as: VITAMIN B12 Take 1 tablet (1,000 mcg total) by mouth daily.   diphenoxylate-atropine 2.5-0.025 MG tablet Commonly known as: LOMOTIL Take 1 tablet by mouth in the morning and at bedtime.   fluticasone-salmeterol 250-50 MCG/ACT Aepb Commonly known as: ADVAIR Inhale 1 puff into the lungs in the morning and at bedtime.   folic acid 1 MG tablet Commonly known as: FOLVITE Take 1 tablet (1 mg total) by mouth daily.   magnesium oxide 400 (240 Mg) MG tablet Commonly known as: MAG-OX Take 3 tablets (1,200 mg total) by mouth daily.   potassium chloride SA 20 MEQ tablet Commonly known as: KLOR-CON M Take 3 tablets (60 mEq total) by mouth daily.        Allergies: No Known Allergies  Past Medical History, Surgical history, Social history, and Family History were reviewed and updated.  Review of Systems: Review of Systems  Constitutional: Negative.   HENT: Negative.    Eyes: Negative.   Respiratory: Negative.  Cardiovascular: Negative.   Gastrointestinal:  Positive for diarrhea.  Genitourinary: Negative.   Musculoskeletal: Negative.   Skin: Negative.   Neurological: Negative.   Endo/Heme/Allergies: Negative.   Psychiatric/Behavioral: Negative.       Physical Exam:  height is '5\' 6"'$  (1.676 m) and weight is 145 lb (65.8 kg). His oral temperature is 98.7 F (37.1 C). His blood pressure is 159/89 (abnormal) and his pulse is 84. His respiration is 17 and oxygen saturation is 97%.   Wt Readings from Last 3 Encounters:  03/21/22 145 lb (65.8 kg)  03/18/22 149 lb 4 oz (67.7 kg)  01/17/22 144 lb (65.3 kg)   Physical Exam Vitals reviewed.  HENT:      Head: Normocephalic and atraumatic.  Eyes:     Pupils: Pupils are equal, round, and reactive to light.  Cardiovascular:     Rate and Rhythm: Normal rate and regular rhythm.     Heart sounds: Normal heart sounds.  Pulmonary:     Effort: Pulmonary effort is normal.     Breath sounds: Normal breath sounds.  Abdominal:     General: Bowel sounds are normal.     Palpations: Abdomen is soft.  Musculoskeletal:        General: No tenderness or deformity. Normal range of motion.     Cervical back: Normal range of motion.  Lymphadenopathy:     Cervical: No cervical adenopathy.  Skin:    General: Skin is warm and dry.     Findings: No erythema or rash.  Neurological:     Mental Status: He is alert and oriented to person, place, and time.  Psychiatric:        Behavior: Behavior normal.        Thought Content: Thought content normal.        Judgment: Judgment normal.     Lab Results  Component Value Date   WBC 7.2 03/21/2022   HGB 12.9 (L) 03/21/2022   HCT 37.8 (L) 03/21/2022   MCV 96.2 03/21/2022   PLT 365 03/21/2022   Lab Results  Component Value Date   FERRITIN 1,186 (H) 12/14/2021   IRON 263 (H) 12/14/2021   TIBC 262 12/14/2021   UIBC UNABLE TO CALCULATE 12/14/2021   IRONPCTSAT 101 (H) 12/14/2021   Lab Results  Component Value Date   RETICCTPCT 2.1 03/21/2022   RBC 3.93 (L) 03/21/2022   RBC 3.92 (L) 03/21/2022   No results found for: "KPAFRELGTCHN", "LAMBDASER", "KAPLAMBRATIO" Lab Results  Component Value Date   IGGSERUM 990 10/01/2021   No results found for: "TOTALPROTELP", "ALBUMINELP", "A1GS", "A2GS", "BETS", "BETA2SER", "GAMS", "MSPIKE", "SPEI"   Chemistry      Component Value Date/Time   NA 140 03/18/2022 1627   NA 140 01/19/2022 0000   K 3.3 (L) 03/18/2022 1627   CL 101 03/18/2022 1627   CO2 27 03/18/2022 1627   BUN 16 03/18/2022 1627   BUN 11 01/19/2022 0000   CREATININE 0.96 03/18/2022 1627   GLU 106 01/19/2022 0000      Component Value Date/Time    CALCIUM 8.6 03/18/2022 1627   ALKPHOS 80 01/17/2022 1327   AST 38 01/17/2022 1327   ALT 22 01/17/2022 1327   BILITOT 0.4 01/17/2022 1327       Impression and Plan: Richard Gates is a very pleasant 61 yo African-American gentleman.  This is a very interesting issue.  He has his elevated Chromogranin A level.  Again we have not found any obvious measurable neuroendocrine tumor.  His diarrhea is better.  As such, I think we can just hold off on giving him Somatuline or octreotide.  Next issue is his elevated iron studies.  Again his hemochromatosis negative from what I can tell.  We will see what his iron studies look like.  He is very interested talk to.  I really enjoyed meeting him.  For right now, I would like to get him back after the holiday season.  I told him that if he had problems with diarrhea before then, we will get him in and we then we can start injectable Sandostatin analog.  Again, shot explained the elevated ferritin.  He could still have hemochromatosis.  That he just may have a mutation that we are not able to identify.  He is going down to [place], [place] for Thanksgiving.  I am sure that he will have a wonderful time down there.  Volanda Napoleon, MD 11/20/20233:23 PM

## 2022-03-22 LAB — IRON AND IRON BINDING CAPACITY (CC-WL,HP ONLY)
Iron: 203 ug/dL — ABNORMAL HIGH (ref 45–182)
Saturation Ratios: 57 % — ABNORMAL HIGH (ref 17.9–39.5)
TIBC: 354 ug/dL (ref 250–450)
UIBC: 151 ug/dL (ref 117–376)

## 2022-03-22 LAB — CHROMOGRANIN A: Chromogranin A (ng/mL): 120.4 ng/mL — ABNORMAL HIGH (ref 0.0–101.8)

## 2022-03-22 LAB — FERRITIN: Ferritin: 1140 ng/mL — ABNORMAL HIGH (ref 24–336)

## 2022-05-13 ENCOUNTER — Telehealth: Payer: Self-pay | Admitting: Pharmacist

## 2022-05-13 MED ORDER — AMLODIPINE BESYLATE 5 MG PO TABS: 5.0000 mg | ORAL_TABLET | Freq: Every day | ORAL | 1 refills | Status: DC

## 2022-05-13 NOTE — Telephone Encounter (Signed)
Patient appearing on report for [facility] - Hypertension Control report due to last documented ambulatory blood pressure of 159/89 on 11/20/203. Next appointment with PCP is 07/25/2022. Patient will be seen in hematology clinic by Dr Marin Olp 05/16/2022.    BP Readings from Last 3 Encounters:  03/21/22 (!) 159/89  03/18/22 (!) 142/80  01/17/22 138/71    Outreached patient to discuss hypertension control and medication management.    Current antihypertensives: amlodipine '5mg'$  daily.  Patient endorsed that he is taking amlodipine but he does forget some of his medications form time to time. Per patient [facility] [address] [facility] but  [facility] for patient. Last Rx was filled 11/09/2021 at [place] for 30 days.   Patient has an automated upper arm home BP machine.  Current blood pressure readings: has not been checking recently  Current physical activity: has an active job but little physical activity outside of his job.  Patient denies hypotensive signs and symptoms including no dizziness, lightheadedness.  Patient denies hypertensive symptoms including no headache, chest pain, shortness of breath.  Patient denies side effects related to amlodipine   Assessment/Plan: - Currently uncontrolled - Reviewed goal blood pressure <140/90 - Reviewed appropriate administration of medication regimen. Updated Rx fro [facility]ipine at [facility]'s [facility] are [facility] that he as [place] and [place]. Cost for amlodipine 90 days was $4.33 - Counseled on long term microvascular and macrovascular complications of uncontrolled hypertension   Cherre Robins, PharmD Clinical Pharmacist [facility]

## 2022-05-16 ENCOUNTER — Encounter: Payer: Self-pay | Admitting: Hematology & Oncology

## 2022-05-16 ENCOUNTER — Other Ambulatory Visit: Payer: Self-pay

## 2022-05-16 ENCOUNTER — Inpatient Hospital Stay: Payer: 59 | Attending: Hematology & Oncology

## 2022-05-16 ENCOUNTER — Inpatient Hospital Stay (HOSPITAL_BASED_OUTPATIENT_CLINIC_OR_DEPARTMENT_OTHER): Payer: PRIVATE HEALTH INSURANCE | Admitting: Hematology & Oncology

## 2022-05-16 VITALS — BP 165/68 | HR 87 | Temp 98.2°F | Resp 19 | Ht 66.0 in | Wt 151.1 lb

## 2022-05-16 DIAGNOSIS — R911 Solitary pulmonary nodule: Secondary | ICD-10-CM

## 2022-05-16 DIAGNOSIS — R7989 Other specified abnormal findings of blood chemistry: Secondary | ICD-10-CM | POA: Diagnosis not present

## 2022-05-16 DIAGNOSIS — D529 Folate deficiency anemia, unspecified: Secondary | ICD-10-CM

## 2022-05-16 DIAGNOSIS — R978 Other abnormal tumor markers: Secondary | ICD-10-CM | POA: Insufficient documentation

## 2022-05-16 DIAGNOSIS — R197 Diarrhea, unspecified: Secondary | ICD-10-CM | POA: Insufficient documentation

## 2022-05-16 DIAGNOSIS — K591 Functional diarrhea: Secondary | ICD-10-CM

## 2022-05-16 DIAGNOSIS — Z79899 Other long term (current) drug therapy: Secondary | ICD-10-CM | POA: Diagnosis not present

## 2022-05-16 DIAGNOSIS — R748 Abnormal levels of other serum enzymes: Secondary | ICD-10-CM

## 2022-05-16 LAB — CMP (CANCER CENTER ONLY)
ALT: 12 U/L (ref 0–44)
AST: 20 U/L (ref 15–41)
Albumin: 3.8 g/dL (ref 3.5–5.0)
Alkaline Phosphatase: 76 U/L (ref 38–126)
Anion gap: 11 (ref 5–15)
BUN: 7 mg/dL — ABNORMAL LOW (ref 8–23)
CO2: 37 mmol/L — ABNORMAL HIGH (ref 22–32)
Calcium: 9.3 mg/dL (ref 8.9–10.3)
Chloride: 94 mmol/L — ABNORMAL LOW (ref 98–111)
Creatinine: 0.92 mg/dL (ref 0.61–1.24)
GFR, Estimated: >60 mL/min (ref >60)
Glucose, Bld: 105 mg/dL — ABNORMAL HIGH (ref 70–99)
Potassium: 3.1 mmol/L — ABNORMAL LOW (ref 3.5–5.1)
Sodium: 142 mmol/L (ref 135–145)
Total Bilirubin: 0.4 mg/dL (ref 0.3–1.2)
Total Protein: 7.3 g/dL (ref 6.5–8.1)

## 2022-05-16 LAB — CBC WITH DIFFERENTIAL (CANCER CENTER ONLY)
Abs Immature Granulocytes: 0.05 10*3/uL (ref 0.00–0.07)
Basophils Absolute: 0 10*3/uL (ref 0.0–0.1)
Basophils Relative: 0 %
Eosinophils Absolute: 0.3 10*3/uL (ref 0.0–0.5)
Eosinophils Relative: 3 %
HCT: 31.5 % — ABNORMAL LOW (ref 39.0–52.0)
Hemoglobin: 10.8 g/dL — ABNORMAL LOW (ref 13.0–17.0)
Immature Granulocytes: 1 %
Lymphocytes Relative: 21 %
Lymphs Abs: 2.1 10*3/uL (ref 0.7–4.0)
MCH: 33.8 pg (ref 26.0–34.0)
MCHC: 34.3 g/dL (ref 30.0–36.0)
MCV: 98.4 fL (ref 80.0–100.0)
Monocytes Absolute: 0.7 10*3/uL (ref 0.1–1.0)
Monocytes Relative: 7 %
Neutro Abs: 6.7 10*3/uL (ref 1.7–7.7)
Neutrophils Relative %: 68 %
Platelet Count: 741 10*3/uL — ABNORMAL HIGH (ref 150–400)
RBC: 3.2 MIL/uL — ABNORMAL LOW (ref 4.22–5.81)
RDW: 14.5 % (ref 11.5–15.5)
WBC Count: 9.7 10*3/uL (ref 4.0–10.5)
nRBC: 0 % (ref 0.0–0.2)

## 2022-05-16 LAB — LACTATE DEHYDROGENASE: LDH: 199 U/L — ABNORMAL HIGH (ref 98–192)

## 2022-05-16 LAB — FERRITIN: Ferritin: 889 ng/mL — ABNORMAL HIGH (ref 24–336)

## 2022-05-16 LAB — MAGNESIUM: Magnesium: 1 mg/dL — ABNORMAL LOW (ref 1.7–2.4)

## 2022-05-16 NOTE — Progress Notes (Signed)
Hematology and Oncology Follow Up Visit  Richard Gates [id number] 19-Jul-1960 62 y.o. 05/16/2022   Principle Diagnosis:  Ferratin elevated in past, hemochromatosis DNA negative Elevated Chromogranin A  Current Therapy: Observation   Interim History:  Richard Gates is here today for follow-up.  We last saw him back in November.  I am little bit posed by his blood work today.  His hemoglobin is 10.8.  His platelet count 741,000.  His white cell count is 9.7.  He has never had problems with thrombocytosis.  I doubt that he is iron deficient.  We got iron studies on him.  His ferritin was 889 with an iron saturation of 25%.  His last Chromogranin A level was 128.  He feels okay.  He really has had no complaints.  There is been no problems with bleeding.  He has had no change in bowel or bladder habits.  He has had no leg swelling.  He has had no rashes.  He has had no headache.  Of note, his last dotatate scan was back in October.  This did not show any evidence of any obvious neuroendocrine carcinoma.  He did have a nodule in the left upper lobe that is only 4 mm.  I cannot imagine that this would be causing an elevated Chromogranin A level.  His appetite is good.  He does not have any significant alcohol use.  Overall, I would say his performance status is probably ECOG 1.   Medications:  Allergies as of 05/16/2022   No Known Allergies      Medication List        Accurate as of May 16, 2022  3:12 PM. If you have any questions, ask your nurse or doctor.          albuterol 108 (90 Base) MCG/ACT inhaler Commonly known as: VENTOLIN HFA Inhale 2 puffs into the lungs every 6 (six) hours as needed for wheezing or shortness of breath.   amLODipine 5 MG tablet Commonly known as: NORVASC Take 1 tablet (5 mg total) by mouth daily.   calcium carbonate 1500 (600 Ca) MG Tabs tablet Commonly known as: OSCAL Take 2 tablets (1,200 mg total) by mouth 2 (two) times daily with a  meal.   cyanocobalamin 1000 MCG tablet Commonly known as: VITAMIN B12 Take 1 tablet (1,000 mcg total) by mouth daily.   diphenoxylate-atropine 2.5-0.025 MG tablet Commonly known as: LOMOTIL Take 1 tablet by mouth in the morning and at bedtime.   fluticasone-salmeterol 250-50 MCG/ACT Aepb Commonly known as: ADVAIR Inhale 1 puff into the lungs in the morning and at bedtime.   folic acid 1 MG tablet Commonly known as: FOLVITE Take 1 tablet (1 mg total) by mouth daily.   magnesium oxide 400 (240 Mg) MG tablet Commonly known as: MAG-OX Take 3 tablets (1,200 mg total) by mouth daily.   potassium chloride SA 20 MEQ tablet Commonly known as: KLOR-CON M Take 3 tablets (60 mEq total) by mouth daily. What changed: how much to take        Allergies: No Known Allergies  Past Medical History, Surgical history, Social history, and Family History were reviewed and updated.  Review of Systems: Review of Systems  Constitutional: Negative.   HENT: Negative.    Eyes: Negative.   Respiratory: Negative.    Cardiovascular: Negative.   Gastrointestinal:  Positive for diarrhea.  Genitourinary: Negative.   Musculoskeletal: Negative.   Skin: Negative.   Neurological: Negative.   Endo/Heme/Allergies: Negative.  Psychiatric/Behavioral: Negative.       Physical Exam:  height is '5\' 6"'$  (1.676 m) and weight is 151 lb 1.9 oz (68.5 kg). His oral temperature is 98.2 F (36.8 C). His blood pressure is 165/68 (abnormal) and his pulse is 87. His respiration is 19 and oxygen saturation is 98%.   Wt Readings from Last 3 Encounters:  05/16/22 151 lb 1.9 oz (68.5 kg)  03/21/22 145 lb (65.8 kg)  03/18/22 149 lb 4 oz (67.7 kg)   Physical Exam Vitals reviewed.  HENT:     Head: Normocephalic and atraumatic.  Eyes:     Pupils: Pupils are equal, round, and reactive to light.  Cardiovascular:     Rate and Rhythm: Normal rate and regular rhythm.     Heart sounds: Normal heart sounds.  Pulmonary:      Effort: Pulmonary effort is normal.     Breath sounds: Normal breath sounds.  Abdominal:     General: Bowel sounds are normal.     Palpations: Abdomen is soft.  Musculoskeletal:        General: No tenderness or deformity. Normal range of motion.     Cervical back: Normal range of motion.  Lymphadenopathy:     Cervical: No cervical adenopathy.  Skin:    General: Skin is warm and dry.     Findings: No erythema or rash.  Neurological:     Mental Status: He is alert and oriented to person, place, and time.  Psychiatric:        Behavior: Behavior normal.        Thought Content: Thought content normal.        Judgment: Judgment normal.     Lab Results  Component Value Date   WBC 9.7 05/16/2022   HGB 10.8 (L) 05/16/2022   HCT 31.5 (L) 05/16/2022   MCV 98.4 05/16/2022   PLT 741 (H) 05/16/2022   Lab Results  Component Value Date   FERRITIN 1,140 (H) 03/21/2022   IRON 203 (H) 03/21/2022   TIBC 354 03/21/2022   UIBC 151 03/21/2022   IRONPCTSAT 57 (H) 03/21/2022   Lab Results  Component Value Date   RETICCTPCT 2.1 03/21/2022   RBC 3.20 (L) 05/16/2022   No results found for: "KPAFRELGTCHN", "LAMBDASER", "KAPLAMBRATIO" Lab Results  Component Value Date   IGGSERUM 990 10/01/2021   No results found for: "TOTALPROTELP", "ALBUMINELP", "A1GS", "A2GS", "BETS", "BETA2SER", "GAMS", "MSPIKE", "SPEI"   Chemistry      Component Value Date/Time   NA 143 03/21/2022 1503   NA 140 01/19/2022 0000   K 3.5 03/21/2022 1503   CL 99 03/21/2022 1503   CO2 31 03/21/2022 1503   BUN 15 03/21/2022 1503   BUN 11 01/19/2022 0000   CREATININE 0.97 03/21/2022 1503   CREATININE 0.96 03/18/2022 1627   GLU 106 01/19/2022 0000      Component Value Date/Time   CALCIUM 9.2 03/21/2022 1503   ALKPHOS 97 03/21/2022 1503   AST 48 (H) 03/21/2022 1503   ALT 25 03/21/2022 1503   BILITOT 0.6 03/21/2022 1503       Impression and Plan: Richard Gates is a very pleasant 62 yo African-American  gentleman.  Again, I am not sure how to explain the change in his blood counts.  I looked at his blood smear.  I really was not all that impressed.    I am going to hold off on doing a myeloproliferative workup.  We will see what his labs look like when  I see him back in a month.  At some point, we will do a CT of the chest to evaluate this left upper lobe nodule.  He looks great.  He really is asymptomatic.  Again, I am not sure I do explain this change in his blood counts.    Volanda Napoleon, MD 1/15/20243:12 PM

## 2022-05-17 LAB — IRON AND IRON BINDING CAPACITY (CC-WL,HP ONLY)
Iron: 67 ug/dL (ref 45–182)
Saturation Ratios: 25 % (ref 17.9–39.5)
TIBC: 265 ug/dL (ref 250–450)
UIBC: 198 ug/dL (ref 117–376)

## 2022-05-18 LAB — CHROMOGRANIN A: Chromogranin A (ng/mL): 126.9 ng/mL — ABNORMAL HIGH (ref 0.0–101.8)

## 2022-05-25 ENCOUNTER — Ambulatory Visit (HOSPITAL_BASED_OUTPATIENT_CLINIC_OR_DEPARTMENT_OTHER)
Admission: RE | Admit: 2022-05-25 | Discharge: 2022-05-25 | Disposition: A | Discharge status: Home / Self Care | Payer: 59 | Source: Ambulatory Visit | Attending: Hematology & Oncology | Admitting: Hematology & Oncology

## 2022-05-25 DIAGNOSIS — I700 Atherosclerosis of aorta: Secondary | ICD-10-CM | POA: Diagnosis not present

## 2022-05-25 DIAGNOSIS — R911 Solitary pulmonary nodule: Secondary | ICD-10-CM | POA: Insufficient documentation

## 2022-05-25 DIAGNOSIS — R918 Other nonspecific abnormal finding of lung field: Secondary | ICD-10-CM | POA: Diagnosis not present

## 2022-05-25 DIAGNOSIS — K5730 Diverticulosis of large intestine without perforation or abscess without bleeding: Secondary | ICD-10-CM | POA: Diagnosis not present

## 2022-05-25 DIAGNOSIS — R634 Abnormal weight loss: Secondary | ICD-10-CM | POA: Diagnosis not present

## 2022-05-25 DIAGNOSIS — K6389 Other specified diseases of intestine: Secondary | ICD-10-CM | POA: Diagnosis not present

## 2022-05-25 DIAGNOSIS — S2243XA Multiple fractures of ribs, bilateral, initial encounter for closed fracture: Secondary | ICD-10-CM | POA: Diagnosis not present

## 2022-05-25 DIAGNOSIS — J439 Emphysema, unspecified: Secondary | ICD-10-CM | POA: Diagnosis not present

## 2022-05-25 DIAGNOSIS — K3189 Other diseases of stomach and duodenum: Secondary | ICD-10-CM | POA: Diagnosis not present

## 2022-05-25 MED ORDER — IOHEXOL 300 MG/ML  SOLN
100.0000 mL | Freq: Once | INTRAMUSCULAR | Status: AC | PRN
  Administered 2022-05-25: 100 mL via INTRAVENOUS

## 2022-05-26 ENCOUNTER — Encounter: Payer: Self-pay | Admitting: *Deleted

## 2022-06-06 IMAGING — DX DG CHEST 2V
2 series · 2 of 2 positions shown · non-contrast
Comparison: Chest radiograph dated 04/16/2018.

CLINICAL DATA: 59-year-old male with COPD.

EXAM:
CHEST - 2 VIEW

[chest pa]
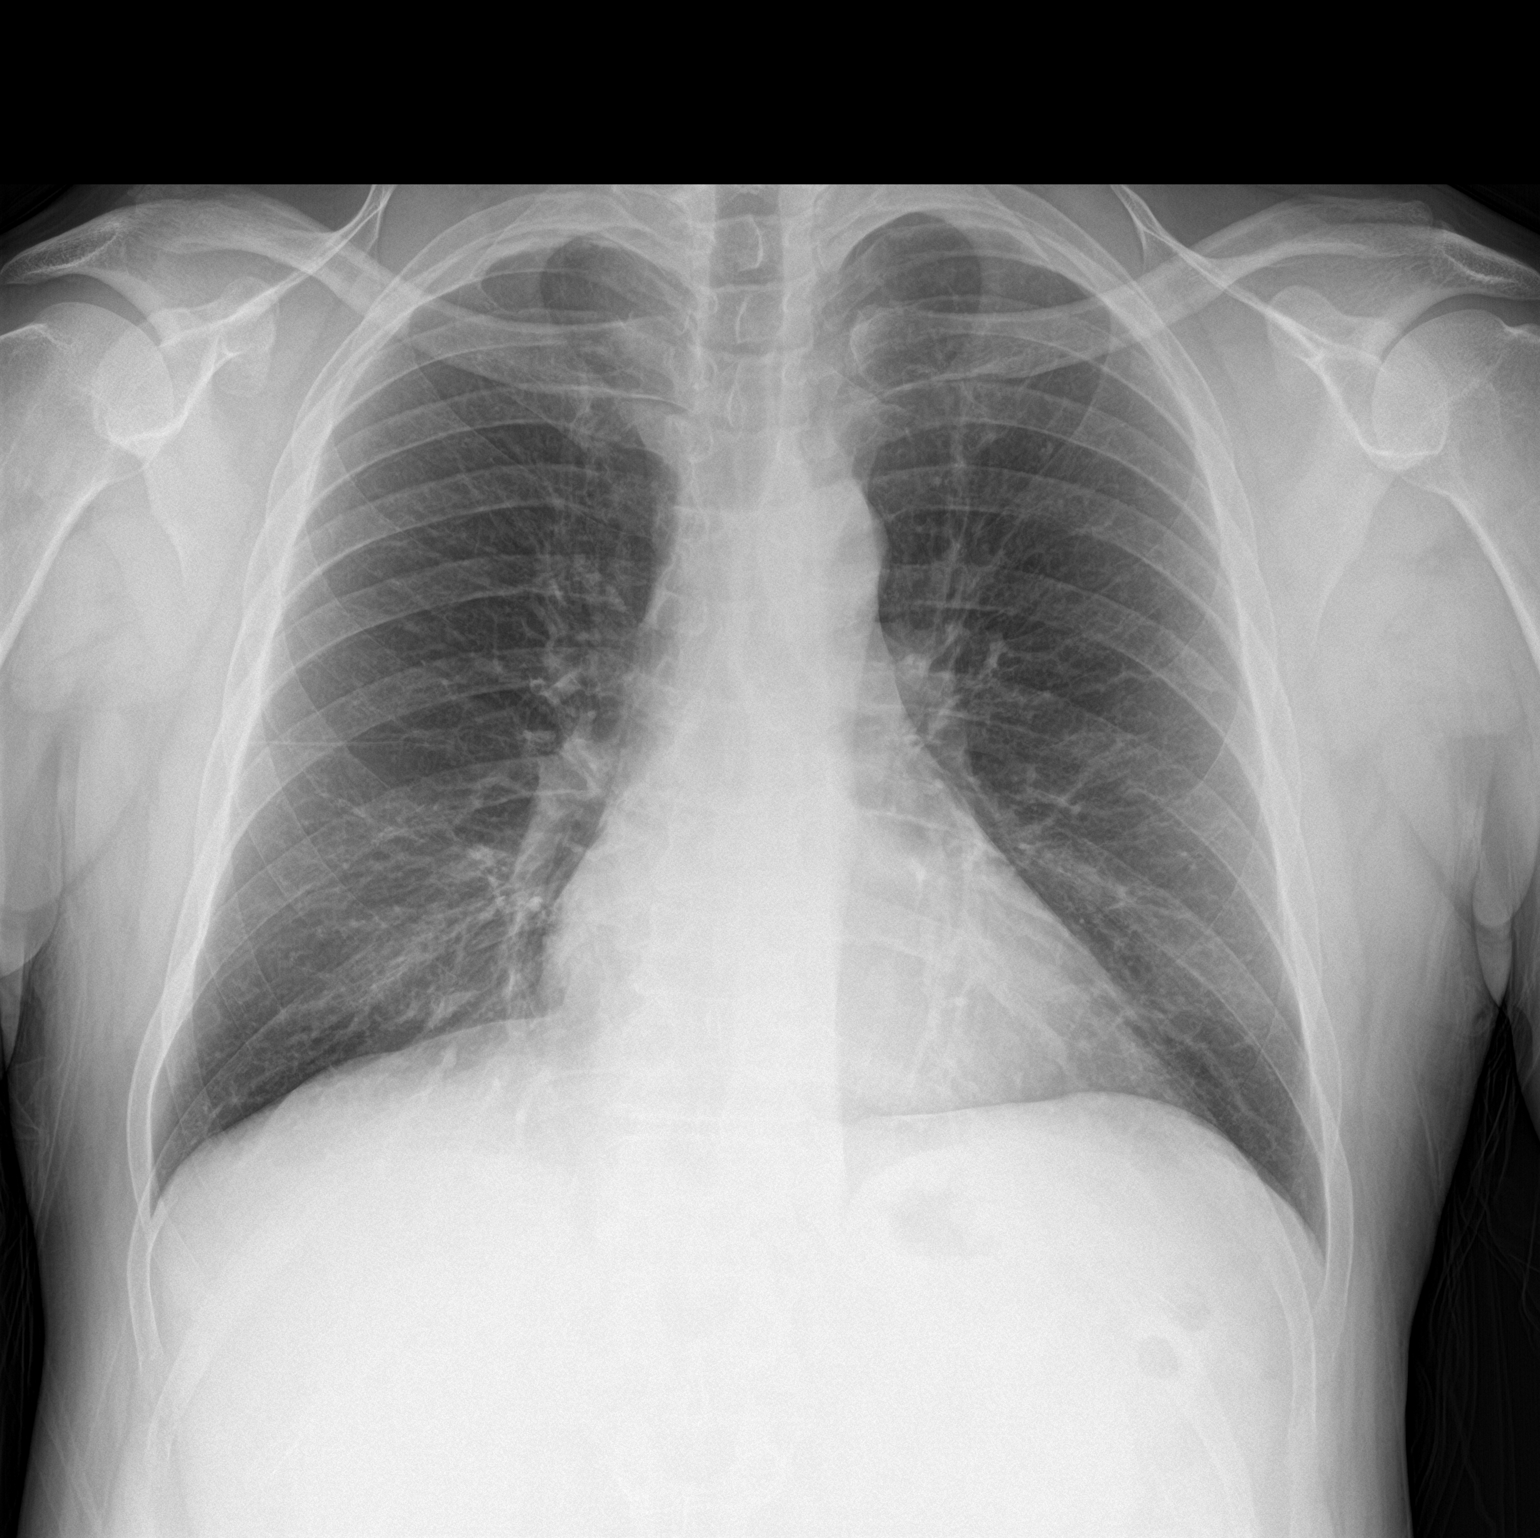

[chest lat]
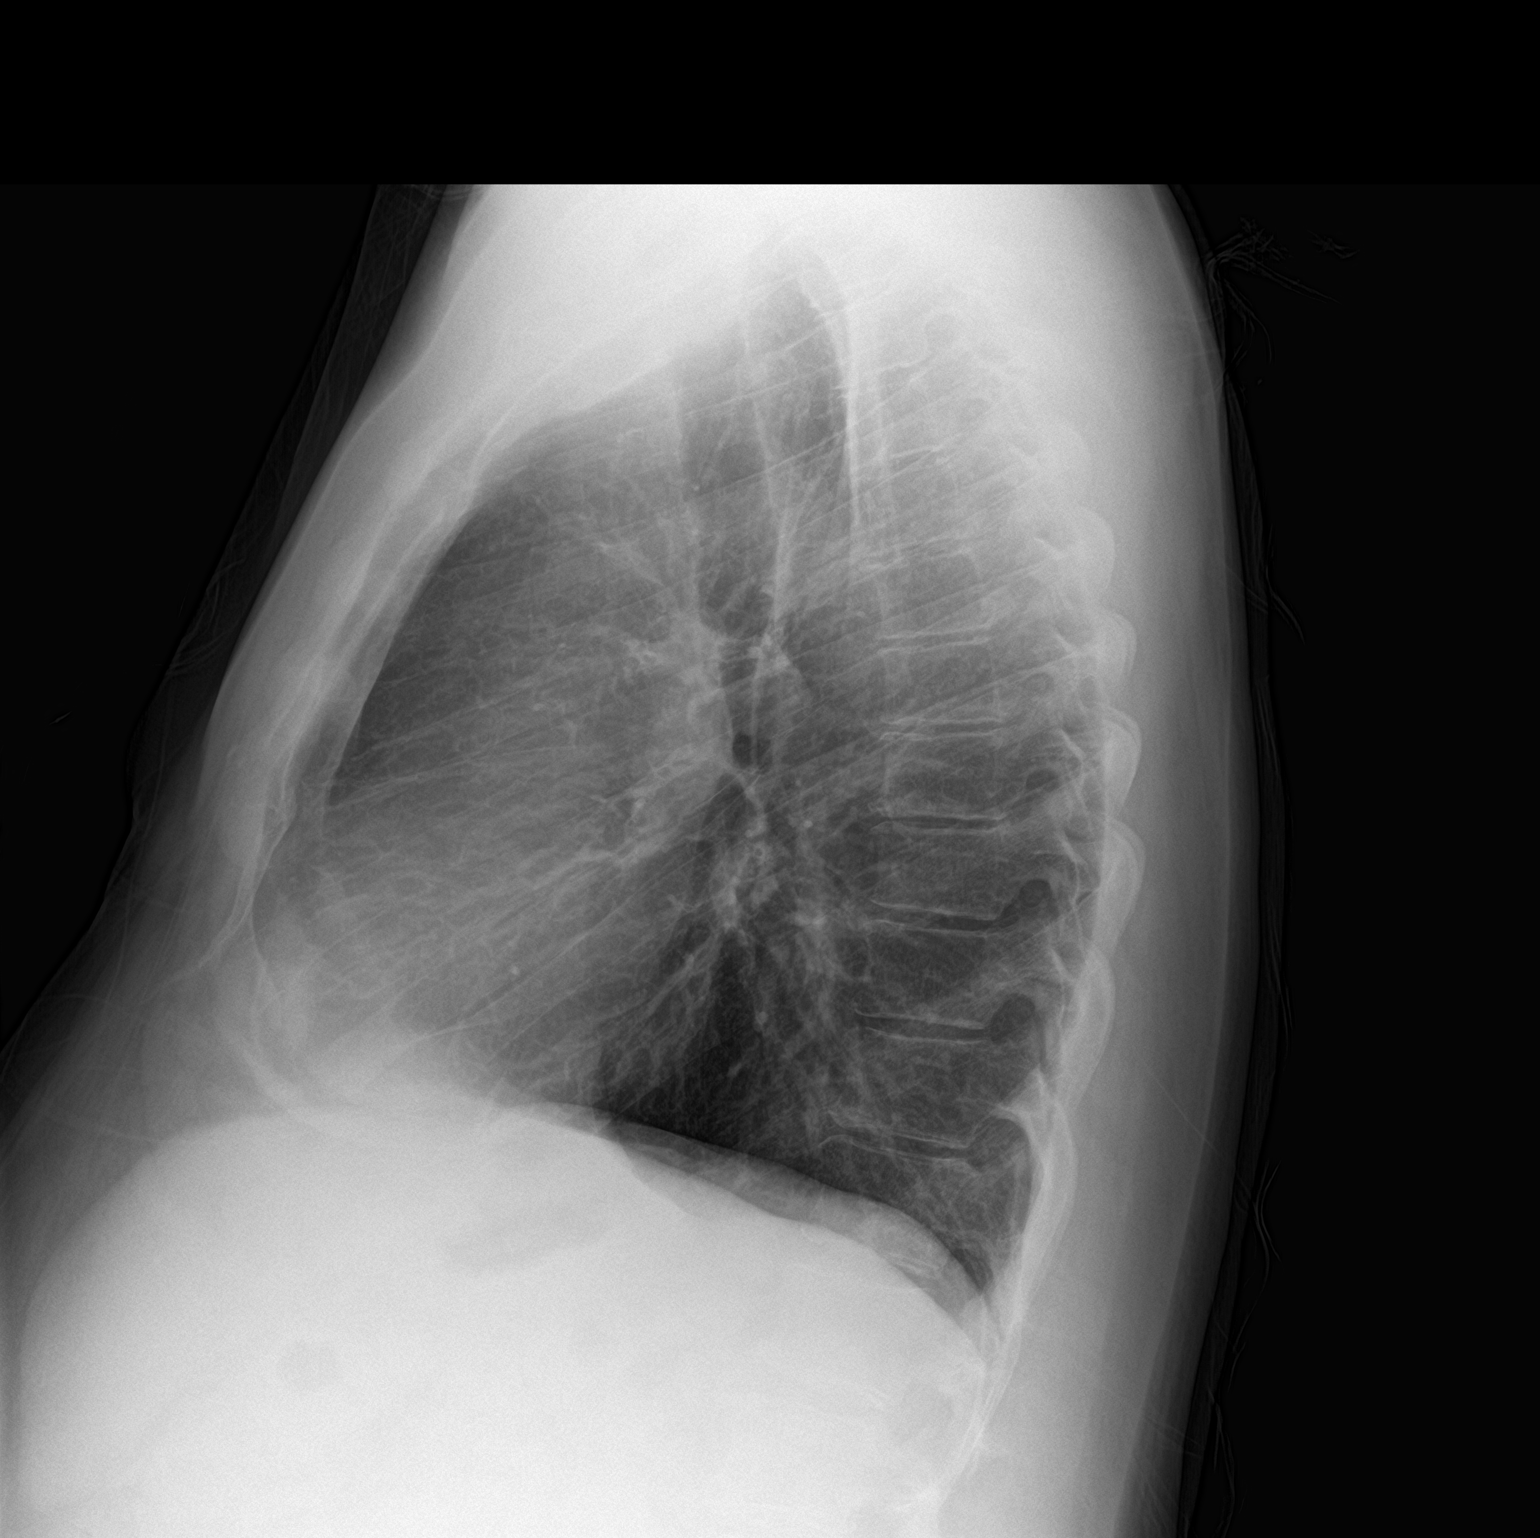

[2 of 2 positions shown; findings below may reference images not displayed]

FINDINGS: The heart size and mediastinal contours are within normal limits.
Both lungs are clear. The visualized skeletal structures are
unremarkable.
IMPRESSION: No active cardiopulmonary disease.

## 2022-06-08 ENCOUNTER — Other Ambulatory Visit: Payer: Self-pay

## 2022-06-08 DIAGNOSIS — Z87891 Personal history of nicotine dependence: Secondary | ICD-10-CM

## 2022-06-15 ENCOUNTER — Other Ambulatory Visit: Payer: Self-pay

## 2022-06-15 ENCOUNTER — Encounter: Payer: Self-pay | Admitting: Hematology & Oncology

## 2022-06-15 ENCOUNTER — Ambulatory Visit (HOSPITAL_BASED_OUTPATIENT_CLINIC_OR_DEPARTMENT_OTHER)
Admission: RE | Admit: 2022-06-15 | Discharge: 2022-06-15 | Disposition: A | Discharge status: Home / Self Care | Payer: 59 | Source: Ambulatory Visit | Attending: Hematology & Oncology | Admitting: Hematology & Oncology

## 2022-06-15 ENCOUNTER — Inpatient Hospital Stay (HOSPITAL_BASED_OUTPATIENT_CLINIC_OR_DEPARTMENT_OTHER): Payer: 59 | Admitting: Hematology & Oncology

## 2022-06-15 ENCOUNTER — Inpatient Hospital Stay: Payer: 59 | Attending: Hematology & Oncology

## 2022-06-15 VITALS — BP 167/84 | HR 91 | Temp 98.7°F | Resp 18 | Wt 150.0 lb

## 2022-06-15 DIAGNOSIS — D539 Nutritional anemia, unspecified: Secondary | ICD-10-CM

## 2022-06-15 DIAGNOSIS — M7989 Other specified soft tissue disorders: Secondary | ICD-10-CM | POA: Insufficient documentation

## 2022-06-15 DIAGNOSIS — D75839 Thrombocytosis, unspecified: Secondary | ICD-10-CM | POA: Diagnosis not present

## 2022-06-15 DIAGNOSIS — R197 Diarrhea, unspecified: Secondary | ICD-10-CM | POA: Insufficient documentation

## 2022-06-15 DIAGNOSIS — R978 Other abnormal tumor markers: Secondary | ICD-10-CM | POA: Insufficient documentation

## 2022-06-15 DIAGNOSIS — D529 Folate deficiency anemia, unspecified: Secondary | ICD-10-CM

## 2022-06-15 LAB — CBC WITH DIFFERENTIAL (CANCER CENTER ONLY)
Abs Immature Granulocytes: 0.3 10*3/uL — ABNORMAL HIGH (ref 0.00–0.07)
Basophils Absolute: 0 10*3/uL (ref 0.0–0.1)
Basophils Relative: 0 %
Eosinophils Absolute: 0.1 10*3/uL (ref 0.0–0.5)
Eosinophils Relative: 0 %
HCT: 33.7 % — ABNORMAL LOW (ref 39.0–52.0)
Hemoglobin: 11 g/dL — ABNORMAL LOW (ref 13.0–17.0)
Immature Granulocytes: 3 %
Lymphocytes Relative: 15 %
Lymphs Abs: 1.7 10*3/uL (ref 0.7–4.0)
MCH: 32.2 pg (ref 26.0–34.0)
MCHC: 32.6 g/dL (ref 30.0–36.0)
MCV: 98.5 fL (ref 80.0–100.0)
Monocytes Absolute: 0.9 10*3/uL (ref 0.1–1.0)
Monocytes Relative: 8 %
Neutro Abs: 8.2 10*3/uL — ABNORMAL HIGH (ref 1.7–7.7)
Neutrophils Relative %: 74 %
Platelet Count: 679 10*3/uL — ABNORMAL HIGH (ref 150–400)
RBC: 3.42 MIL/uL — ABNORMAL LOW (ref 4.22–5.81)
RDW: 14.2 % (ref 11.5–15.5)
WBC Count: 11.3 10*3/uL — ABNORMAL HIGH (ref 4.0–10.5)
nRBC: 0 % (ref 0.0–0.2)

## 2022-06-15 LAB — CMP (CANCER CENTER ONLY)
ALT: 21 U/L (ref 0–44)
AST: 13 U/L — ABNORMAL LOW (ref 15–41)
Albumin: 3.7 g/dL (ref 3.5–5.0)
Alkaline Phosphatase: 71 U/L (ref 38–126)
Anion gap: 6 (ref 5–15)
BUN: 21 mg/dL (ref 8–23)
CO2: 30 mmol/L (ref 22–32)
Calcium: 9.8 mg/dL (ref 8.9–10.3)
Chloride: 102 mmol/L (ref 98–111)
Creatinine: 0.84 mg/dL (ref 0.61–1.24)
GFR, Estimated: >60 mL/min (ref >60)
Glucose, Bld: 101 mg/dL — ABNORMAL HIGH (ref 70–99)
Potassium: 3.7 mmol/L (ref 3.5–5.1)
Sodium: 138 mmol/L (ref 135–145)
Total Bilirubin: 0.2 mg/dL — ABNORMAL LOW (ref 0.3–1.2)
Total Protein: 6.8 g/dL (ref 6.5–8.1)

## 2022-06-15 LAB — SAVE SMEAR(SSMR), FOR PROVIDER SLIDE REVIEW

## 2022-06-15 LAB — RETICULOCYTES
Immature Retic Fract: 13.8 % (ref 2.3–15.9)
RBC.: 3.42 MIL/uL — ABNORMAL LOW (ref 4.22–5.81)
Retic Count, Absolute: 100.5 10*3/uL (ref 19.0–186.0)
Retic Ct Pct: 2.9 % (ref 0.4–3.1)

## 2022-06-15 LAB — URIC ACID: Uric Acid, Serum: 6.5 mg/dL (ref 3.7–8.6)

## 2022-06-15 LAB — FERRITIN: Ferritin: 364 ng/mL — ABNORMAL HIGH (ref 24–336)

## 2022-06-15 NOTE — Progress Notes (Signed)
Hematology and Oncology Follow Up Visit  GURDEEP MACAULEY [id number] 1961-01-03 62 y.o. 06/15/2022   Principle Diagnosis:  Ferratin elevated in past, hemochromatosis DNA negative Elevated Chromogranin A  Current Therapy: Observation   Interim History:  Mr. Colmenero is here today for follow-up.  The problem that he is now having is that he is having swelling in his hands.  On the dorsum of his hands, he has quite a bit of swelling.  I do not know if he has any issues with gout.  I have sent off a uric acid level on him.  Is possible that we may be looking at rheumatism.  I think there is a member the family who has this.  When we last saw him, his iron studies showed a ferritin of 889 with iron saturation of 25%.  He has had no fever.  There is been no rashes.  He has had no leg swelling.  He has had no bleeding.  There is been no change in bowel or bladder habits.    When we last saw him, his Chromogranin A level was 127.  There has been no bleeding.  He has had no mouth sores.  He has had no visual changes.  Overall, I would have to say that his performance status is ECOG 1.     Medications:  Allergies as of 06/15/2022   No Known Allergies      Medication List        Accurate as of June 15, 2022  4:16 PM. If you have any questions, ask your nurse or doctor.          albuterol 108 (90 Base) MCG/ACT inhaler Commonly known as: VENTOLIN HFA Inhale 2 puffs into the lungs every 6 (six) hours as needed for wheezing or shortness of breath.   allopurinol 100 MG tablet Commonly known as: ZYLOPRIM Take by mouth daily. Dose unknown   amLODipine 5 MG tablet Commonly known as: NORVASC Take 1 tablet (5 mg total) by mouth daily.   calcium carbonate 1500 (600 Ca) MG Tabs tablet Commonly known as: OSCAL Take 2 tablets (1,200 mg total) by mouth 2 (two) times daily with a meal.   cyanocobalamin 1000 MCG tablet Commonly known as: VITAMIN B12 Take 1 tablet (1,000 mcg total)  by mouth daily.   diphenoxylate-atropine 2.5-0.025 MG tablet Commonly known as: LOMOTIL Take 1 tablet by mouth in the morning and at bedtime.   fluticasone-salmeterol 250-50 MCG/ACT Aepb Commonly known as: ADVAIR Inhale 1 puff into the lungs in the morning and at bedtime.   folic acid 1 MG tablet Commonly known as: FOLVITE Take 1 tablet (1 mg total) by mouth daily.   magnesium oxide 400 (240 Mg) MG tablet Commonly known as: MAG-OX Take 3 tablets (1,200 mg total) by mouth daily.   potassium chloride SA 20 MEQ tablet Commonly known as: KLOR-CON M Take 3 tablets (60 mEq total) by mouth daily. What changed: how much to take        Allergies: No Known Allergies  Past Medical History, Surgical history, Social history, and Family History were reviewed and updated.  Review of Systems: Review of Systems  Constitutional: Negative.   HENT: Negative.    Eyes: Negative.   Respiratory: Negative.    Cardiovascular: Negative.   Gastrointestinal:  Positive for diarrhea.  Genitourinary: Negative.   Musculoskeletal: Negative.   Skin: Negative.   Neurological: Negative.   Endo/Heme/Allergies: Negative.   Psychiatric/Behavioral: Negative.       Physical  Exam:  weight is 150 lb (68 kg). His oral temperature is 98.7 F (37.1 C). His blood pressure is 167/84 (abnormal) and his pulse is 91. His respiration is 18 and oxygen saturation is 98%.   Wt Readings from Last 3 Encounters:  06/15/22 150 lb (68 kg)  05/16/22 151 lb 1.9 oz (68.5 kg)  03/21/22 145 lb (65.8 kg)   Physical Exam Vitals reviewed.  HENT:     Head: Normocephalic and atraumatic.  Eyes:     Pupils: Pupils are equal, round, and reactive to light.  Cardiovascular:     Rate and Rhythm: Normal rate and regular rhythm.     Heart sounds: Normal heart sounds.  Pulmonary:     Effort: Pulmonary effort is normal.     Breath sounds: Normal breath sounds.  Abdominal:     General: Bowel sounds are normal.     Palpations:  Abdomen is soft.  Musculoskeletal:        General: No tenderness or deformity. Normal range of motion.     Cervical back: Normal range of motion.  Lymphadenopathy:     Cervical: No cervical adenopathy.  Skin:    General: Skin is warm and dry.     Findings: No erythema or rash.  Neurological:     Mental Status: He is alert and oriented to person, place, and time.  Psychiatric:        Behavior: Behavior normal.        Thought Content: Thought content normal.        Judgment: Judgment normal.     Lab Results  Component Value Date   WBC 11.3 (H) 06/15/2022   HGB 11.0 (L) 06/15/2022   HCT 33.7 (L) 06/15/2022   MCV 98.5 06/15/2022   PLT 679 (H) 06/15/2022   Lab Results  Component Value Date   FERRITIN 889 (H) 05/16/2022   IRON 67 05/16/2022   TIBC 265 05/16/2022   UIBC 198 05/16/2022   IRONPCTSAT 25 05/16/2022   Lab Results  Component Value Date   RETICCTPCT 2.9 06/15/2022   RBC 3.42 (L) 06/15/2022   RBC 3.42 (L) 06/15/2022   No results found for: "KPAFRELGTCHN", "LAMBDASER", "KAPLAMBRATIO" Lab Results  Component Value Date   IGGSERUM 990 10/01/2021   No results found for: "TOTALPROTELP", "ALBUMINELP", "A1GS", "A2GS", "BETS", "BETA2SER", "GAMS", "MSPIKE", "SPEI"   Chemistry      Component Value Date/Time   NA 138 06/15/2022 1510   NA 140 01/19/2022 0000   K 3.7 06/15/2022 1510   CL 102 06/15/2022 1510   CO2 30 06/15/2022 1510   BUN 21 06/15/2022 1510   BUN 11 01/19/2022 0000   CREATININE 0.84 06/15/2022 1510   CREATININE 0.96 03/18/2022 1627   GLU 106 01/19/2022 0000      Component Value Date/Time   CALCIUM 9.8 06/15/2022 1510   ALKPHOS 71 06/15/2022 1510   AST 13 (L) 06/15/2022 1510   ALT 21 06/15/2022 1510   BILITOT 0.2 (L) 06/15/2022 1510       Impression and Plan: Mr. Rippel is a very pleasant 62 yo African-American gentleman.  Is possible that the thrombocytosis could be reactive.  Again, looks that he has some kind of rheumatologic condition.   We will see what his uric acid level is.  We will see what the hand x-rays show.  Again I looked at his blood smear and really did not see anything that looked suggestive of a myeloproliferative problem.  If his platelet count continues to stay  elevated, I may have to send off a JAK2 panel.  We would like to get him back in about a month or so.  Hopefully, he will have seen his family doctor.   Volanda Napoleon, MD 2/14/20244:16 PM

## 2022-06-16 ENCOUNTER — Telehealth: Payer: Self-pay

## 2022-06-16 LAB — IRON AND IRON BINDING CAPACITY (CC-WL,HP ONLY)
Iron: 58 ug/dL (ref 45–182)
Saturation Ratios: 20 % (ref 17.9–39.5)
TIBC: 290 ug/dL (ref 250–450)
UIBC: 232 ug/dL (ref 117–376)

## 2022-06-16 LAB — ERYTHROPOIETIN: Erythropoietin: 8.5 m[IU]/mL (ref 2.6–18.5)

## 2022-06-16 NOTE — Telephone Encounter (Signed)
-----   Message from Volanda Napoleon, MD sent at 06/15/2022  5:38 PM EST ----- Please call and let him know that the uric acid level is actually okay.  It is not high.  Thanks.  Laurey Arrow

## 2022-06-16 NOTE — Telephone Encounter (Signed)
Advised via MyChart.

## 2022-06-20 ENCOUNTER — Telehealth: Payer: Self-pay | Admitting: *Deleted

## 2022-06-20 NOTE — Telephone Encounter (Signed)
-----   Message from Volanda Napoleon, MD sent at 06/20/2022  9:07 AM EST ----- Call and let him know that the hand x-rays seem to show arthritis.  This is some that his family doctor can take care of.  Please make sure that the reports go to his family doctor.  Thanks.  Laurey Arrow

## 2022-07-07 DIAGNOSIS — R197 Diarrhea, unspecified: Secondary | ICD-10-CM | POA: Diagnosis not present

## 2022-07-07 DIAGNOSIS — Z87891 Personal history of nicotine dependence: Secondary | ICD-10-CM | POA: Diagnosis not present

## 2022-07-07 DIAGNOSIS — Z803 Family history of malignant neoplasm of breast: Secondary | ICD-10-CM | POA: Diagnosis not present

## 2022-07-07 DIAGNOSIS — E876 Hypokalemia: Secondary | ICD-10-CM | POA: Diagnosis not present

## 2022-07-07 DIAGNOSIS — J439 Emphysema, unspecified: Secondary | ICD-10-CM | POA: Diagnosis not present

## 2022-07-07 DIAGNOSIS — Z7951 Long term (current) use of inhaled steroids: Secondary | ICD-10-CM | POA: Diagnosis not present

## 2022-07-07 DIAGNOSIS — E538 Deficiency of other specified B group vitamins: Secondary | ICD-10-CM | POA: Diagnosis not present

## 2022-07-07 DIAGNOSIS — Z8249 Family history of ischemic heart disease and other diseases of the circulatory system: Secondary | ICD-10-CM | POA: Diagnosis not present

## 2022-07-07 DIAGNOSIS — Z811 Family history of alcohol abuse and dependence: Secondary | ICD-10-CM | POA: Diagnosis not present

## 2022-07-07 DIAGNOSIS — I10 Essential (primary) hypertension: Secondary | ICD-10-CM | POA: Diagnosis not present

## 2022-07-07 DIAGNOSIS — K08109 Complete loss of teeth, unspecified cause, unspecified class: Secondary | ICD-10-CM | POA: Diagnosis not present

## 2022-07-11 ENCOUNTER — Inpatient Hospital Stay: Payer: 59 | Attending: Hematology & Oncology

## 2022-07-11 ENCOUNTER — Inpatient Hospital Stay (HOSPITAL_BASED_OUTPATIENT_CLINIC_OR_DEPARTMENT_OTHER): Payer: 59 | Admitting: Hematology & Oncology

## 2022-07-11 ENCOUNTER — Encounter: Payer: Self-pay | Admitting: Hematology & Oncology

## 2022-07-11 ENCOUNTER — Other Ambulatory Visit: Payer: Self-pay

## 2022-07-11 VITALS — BP 142/65 | HR 103 | Temp 98.4°F | Resp 18 | Ht 66.0 in | Wt 155.0 lb

## 2022-07-11 DIAGNOSIS — D500 Iron deficiency anemia secondary to blood loss (chronic): Secondary | ICD-10-CM | POA: Diagnosis not present

## 2022-07-11 DIAGNOSIS — D539 Nutritional anemia, unspecified: Secondary | ICD-10-CM

## 2022-07-11 DIAGNOSIS — M7989 Other specified soft tissue disorders: Secondary | ICD-10-CM

## 2022-07-11 DIAGNOSIS — R7989 Other specified abnormal findings of blood chemistry: Secondary | ICD-10-CM | POA: Diagnosis not present

## 2022-07-11 LAB — CMP (CANCER CENTER ONLY)
ALT: 16 U/L (ref 0–44)
AST: 17 U/L (ref 15–41)
Albumin: 4.4 g/dL (ref 3.5–5.0)
Alkaline Phosphatase: 75 U/L (ref 38–126)
Anion gap: 11 (ref 5–15)
BUN: 21 mg/dL (ref 8–23)
CO2: 28 mmol/L (ref 22–32)
Calcium: 10 mg/dL (ref 8.9–10.3)
Chloride: 101 mmol/L (ref 98–111)
Creatinine: 1.29 mg/dL — ABNORMAL HIGH (ref 0.61–1.24)
GFR, Estimated: >60 mL/min (ref >60)
Glucose, Bld: 108 mg/dL — ABNORMAL HIGH (ref 70–99)
Potassium: 4.1 mmol/L (ref 3.5–5.1)
Sodium: 140 mmol/L (ref 135–145)
Total Bilirubin: 0.5 mg/dL (ref 0.3–1.2)
Total Protein: 7.7 g/dL (ref 6.5–8.1)

## 2022-07-11 LAB — CBC WITH DIFFERENTIAL (CANCER CENTER ONLY)
Abs Immature Granulocytes: 0.13 10*3/uL — ABNORMAL HIGH (ref 0.00–0.07)
Basophils Absolute: 0 10*3/uL (ref 0.0–0.1)
Basophils Relative: 0 %
Eosinophils Absolute: 0.2 10*3/uL (ref 0.0–0.5)
Eosinophils Relative: 2 %
HCT: 40.6 % (ref 39.0–52.0)
Hemoglobin: 13.5 g/dL (ref 13.0–17.0)
Immature Granulocytes: 1 %
Lymphocytes Relative: 16 %
Lymphs Abs: 1.7 10*3/uL (ref 0.7–4.0)
MCH: 31.6 pg (ref 26.0–34.0)
MCHC: 33.3 g/dL (ref 30.0–36.0)
MCV: 95.1 fL (ref 80.0–100.0)
Monocytes Absolute: 0.9 10*3/uL (ref 0.1–1.0)
Monocytes Relative: 9 %
Neutro Abs: 7.4 10*3/uL (ref 1.7–7.7)
Neutrophils Relative %: 72 %
Platelet Count: 363 10*3/uL (ref 150–400)
RBC: 4.27 MIL/uL (ref 4.22–5.81)
RDW: 14 % (ref 11.5–15.5)
WBC Count: 10.3 10*3/uL (ref 4.0–10.5)
nRBC: 0 % (ref 0.0–0.2)

## 2022-07-11 LAB — SAVE SMEAR(SSMR), FOR PROVIDER SLIDE REVIEW

## 2022-07-11 LAB — LACTATE DEHYDROGENASE: LDH: 179 U/L (ref 98–192)

## 2022-07-11 NOTE — Progress Notes (Signed)
Hematology and Oncology Follow Up Visit  LAZAROS PROVENCAL [id number] [birth year] 62 y.o. 07/11/2022   Principle Diagnosis:  Ferratin elevated in past, hemochromatosis DNA negative Elevated Chromogranin A  Current Therapy: Observation   Interim History:  Mr. Gillyard is here today for follow-up.  He does seem to be doing better.  His joints do not seem to be as swollen.  I think he sees his family doctor later on this week.  He does have a new job.  He started today.  I hope that this will go well for him.  I know that he is excited about it.  When we last saw him, his Chromogranin A level was holding steady at 126.  He has had some wheezing.  He is on albuterol.  He may need to have this changed to a combination type of inhaler.  He has had no nausea or vomiting.  There is been no diarrhea.  He has had no urinary issues.  He has had no rashes.  He has had no leg swelling.  Currently, I would have said that his performance status is probably ECOG 1.   Medications:  Allergies as of 07/11/2022   No Known Allergies      Medication List        Accurate as of July 11, 2022  4:26 PM. If you have any questions, ask your nurse or doctor.          albuterol 108 (90 Base) MCG/ACT inhaler Commonly known as: VENTOLIN HFA Inhale 2 puffs into the lungs every 6 (six) hours as needed for wheezing or shortness of breath.   allopurinol 100 MG tablet Commonly known as: ZYLOPRIM Take by mouth daily. Dose unknown   amLODipine 5 MG tablet Commonly known as: NORVASC Take 1 tablet (5 mg total) by mouth daily.   calcium carbonate 1500 (600 Ca) MG Tabs tablet Commonly known as: OSCAL Take 2 tablets (1,200 mg total) by mouth 2 (two) times daily with a meal.   cyanocobalamin 1000 MCG tablet Commonly known as: VITAMIN B12 Take 1 tablet (1,000 mcg total) by mouth daily.   diphenoxylate-atropine 2.5-0.025 MG tablet Commonly known as: LOMOTIL Take 1 tablet by mouth in the morning and at  bedtime.   fluticasone-salmeterol 250-50 MCG/ACT Aepb Commonly known as: ADVAIR Inhale 1 puff into the lungs in the morning and at bedtime.   folic acid 1 MG tablet Commonly known as: FOLVITE Take 1 tablet (1 mg total) by mouth daily.   magnesium oxide 400 (240 Mg) MG tablet Commonly known as: MAG-OX Take 3 tablets (1,200 mg total) by mouth daily.   potassium chloride SA 20 MEQ tablet Commonly known as: KLOR-CON M Take 3 tablets (60 mEq total) by mouth daily. What changed: how much to take        Allergies: No Known Allergies  Past Medical History, Surgical history, Social history, and Family History were reviewed and updated.  Review of Systems: Review of Systems  Constitutional: Negative.   HENT: Negative.    Eyes: Negative.   Respiratory: Negative.    Cardiovascular: Negative.   Gastrointestinal:  Positive for diarrhea.  Genitourinary: Negative.   Musculoskeletal: Negative.   Skin: Negative.   Neurological: Negative.   Endo/Heme/Allergies: Negative.   Psychiatric/Behavioral: Negative.       Physical Exam:  height is '5\' 6"'$  (1.676 m) and weight is 155 lb (70.3 kg). His oral temperature is 98.4 F (36.9 C). His blood pressure is 142/65 (abnormal) and his pulse is  103 (abnormal). His respiration is 18 and oxygen saturation is 98%.   Wt Readings from Last 3 Encounters:  07/11/22 155 lb (70.3 kg)  06/15/22 150 lb (68 kg)  05/16/22 151 lb 1.9 oz (68.5 kg)   Physical Exam Vitals reviewed.  HENT:     Head: Normocephalic and atraumatic.  Eyes:     Pupils: Pupils are equal, round, and reactive to light.  Cardiovascular:     Rate and Rhythm: Normal rate and regular rhythm.     Heart sounds: Normal heart sounds.  Pulmonary:     Effort: Pulmonary effort is normal.     Breath sounds: Normal breath sounds.  Abdominal:     General: Bowel sounds are normal.     Palpations: Abdomen is soft.  Musculoskeletal:        General: No tenderness or deformity. Normal range  of motion.     Cervical back: Normal range of motion.  Lymphadenopathy:     Cervical: No cervical adenopathy.  Skin:    General: Skin is warm and dry.     Findings: No erythema or rash.  Neurological:     Mental Status: He is alert and oriented to person, place, and time.  Psychiatric:        Behavior: Behavior normal.        Thought Content: Thought content normal.        Judgment: Judgment normal.     Lab Results  Component Value Date   WBC 10.3 07/11/2022   HGB 13.5 07/11/2022   HCT 40.6 07/11/2022   MCV 95.1 07/11/2022   PLT 363 07/11/2022   Lab Results  Component Value Date   FERRITIN 364 (H) 06/15/2022   IRON 58 06/15/2022   TIBC 290 06/15/2022   UIBC 232 06/15/2022   IRONPCTSAT 20 06/15/2022   Lab Results  Component Value Date   RETICCTPCT 2.9 06/15/2022   RBC 4.27 07/11/2022   No results found for: "KPAFRELGTCHN", "LAMBDASER", "KAPLAMBRATIO" Lab Results  Component Value Date   IGGSERUM 990 10/01/2021   No results found for: "TOTALPROTELP", "ALBUMINELP", "A1GS", "A2GS", "BETS", "BETA2SER", "GAMS", "MSPIKE", "SPEI"   Chemistry      Component Value Date/Time   NA 140 07/11/2022 1516   NA 140 01/19/2022 0000   K 4.1 07/11/2022 1516   CL 101 07/11/2022 1516   CO2 28 07/11/2022 1516   BUN 21 07/11/2022 1516   BUN 11 01/19/2022 0000   CREATININE 1.29 (H) 07/11/2022 1516   CREATININE 0.96 03/18/2022 1627   GLU 106 01/19/2022 0000      Component Value Date/Time   CALCIUM 10.0 07/11/2022 1516   ALKPHOS 75 07/11/2022 1516   AST 17 07/11/2022 1516   ALT 16 07/11/2022 1516   BILITOT 0.5 07/11/2022 1516       Impression and Plan: Mr. Steger is a very pleasant 62 yo African-American gentleman.  I am glad to see that the thrombocytosis has resolved.  I am still not sure as exactly why he had this.  It could have been reactionary/inflammatory.  I would not see any problems with respect to him having Carcinoid.  We will see what the Chromogranin A level  is.  I would like to see him back in about 6 weeks or so.  At some point, we may have to think about another PET scan for the elevated Chromogranin A level.   Volanda Napoleon, MD 3/11/20244:26 PM

## 2022-07-21 LAB — JAK2 (INCLUDING V617F AND EXON 12), MPL,& CALR-NEXT GEN SEQ

## 2022-07-25 ENCOUNTER — Encounter: Payer: Self-pay | Admitting: Internal Medicine

## 2022-07-25 ENCOUNTER — Ambulatory Visit (INDEPENDENT_AMBULATORY_CARE_PROVIDER_SITE_OTHER): Payer: 59 | Admitting: Internal Medicine

## 2022-07-25 VITALS — BP 136/76 | HR 107 | Temp 98.2°F | Resp 16 | Ht 66.0 in | Wt 158.4 lb

## 2022-07-25 DIAGNOSIS — Z125 Encounter for screening for malignant neoplasm of prostate: Secondary | ICD-10-CM | POA: Diagnosis not present

## 2022-07-25 DIAGNOSIS — I10 Essential (primary) hypertension: Secondary | ICD-10-CM

## 2022-07-25 DIAGNOSIS — M79641 Pain in right hand: Secondary | ICD-10-CM | POA: Diagnosis not present

## 2022-07-25 DIAGNOSIS — M79642 Pain in left hand: Secondary | ICD-10-CM | POA: Diagnosis not present

## 2022-07-25 DIAGNOSIS — M7989 Other specified soft tissue disorders: Secondary | ICD-10-CM

## 2022-07-25 DIAGNOSIS — E785 Hyperlipidemia, unspecified: Secondary | ICD-10-CM | POA: Diagnosis not present

## 2022-07-25 DIAGNOSIS — K529 Noninfective gastroenteritis and colitis, unspecified: Secondary | ICD-10-CM | POA: Diagnosis not present

## 2022-07-25 DIAGNOSIS — Z0001 Encounter for general adult medical examination with abnormal findings: Secondary | ICD-10-CM | POA: Diagnosis not present

## 2022-07-25 DIAGNOSIS — E878 Other disorders of electrolyte and fluid balance, not elsewhere classified: Secondary | ICD-10-CM | POA: Diagnosis not present

## 2022-07-25 DIAGNOSIS — Z0000 Encounter for general adult medical examination without abnormal findings: Secondary | ICD-10-CM

## 2022-07-25 MED ORDER — TRELEGY ELLIPTA 200-62.5-25 MCG/ACT IN AEPB: 1.0000 | INHALATION_SPRAY | Freq: Every day | RESPIRATORY_TRACT | 1 refills | Status: DC

## 2022-07-25 NOTE — Patient Instructions (Addendum)
Vaccines I recommend:  RSV vaccine COVID vaccine    Check the  blood pressure regularly BP GOAL is between 110/65 and  135/85. If it is consistently higher or lower, let me know  For asthma: I sent a prescription for Trelegy 1 puff daily. You still can take albuterol as needed for cough or wheezing  GO TO THE LAB : Get the blood work     [place], [place] back for a checkup in 3 months  STOP BY THE FIRST FLOOR:  get the XR

## 2022-07-25 NOTE — Progress Notes (Unsigned)
Subjective:    Patient ID: Richard Gates, male    DOB: July 10, 1960, 62 y.o.   MRN: [id number]  DOS:  07/25/2022 Type of visit - description: CPX  Here for CPX In the last couple of years, still with chronic diarrhea and weight loss. Reports he is much better.  No further diarrhea.  No nausea vomiting. Unable to afford Advair, on albuterol twice daily due to persistent quizzing. No sputum production. Occasionally has pain at the knuckles and some swelling.   Wt Readings from Last 3 Encounters:  07/25/22 158 lb 6 oz (71.8 kg)  07/11/22 155 lb (70.3 kg)  06/15/22 150 lb (68 kg)    Review of Systems See above   Past Medical History:  Diagnosis Date   COPD (chronic obstructive pulmonary disease) ([place])    Coronary artery calcification seen on CAT scan 08/2019   Coronary calcium score 103; short LM with<25% mixed calcific plaque (minimal); aortic atherosclerosis with normal size.  No dissection.  No aortic valve calcification   GERD (gastroesophageal reflux disease)    Hyperlipidemia    Hypertension     Past Surgical History:  Procedure Laterality Date   APPENDECTOMY     BIOPSY  10/02/2021   Procedure: BIOPSY;  Surgeon: Jerene Bears, MD;  Location: [facility] ENDOSCOPY;  Service: Gastroenterology;;   COLONOSCOPY  08/24/2016   COLONOSCOPY WITH PROPOFOL N/A 10/02/2021   Procedure: COLONOSCOPY WITH PROPOFOL;  Surgeon: Jerene Bears, MD;  Location: [place];  Service: Gastroenterology;  Laterality: N/A;   ESOPHAGOGASTRODUODENOSCOPY (EGD) WITH PROPOFOL N/A 10/02/2021   Procedure: ESOPHAGOGASTRODUODENOSCOPY (EGD) WITH PROPOFOL;  Surgeon: Jerene Bears, MD;  Location: [facility] ENDOSCOPY;  Service: Gastroenterology;  Laterality: N/A;   POLYPECTOMY     TRANSTHORACIC ECHOCARDIOGRAM  04/2018   EF 65-70% (vigorous). No RWMA.  Gr 1 DD.  Normal valves.  (In setting of COPD exacerbation)    Current Outpatient Medications  Medication Instructions   albuterol (VENTOLIN HFA) 108 (90 Base) MCG/ACT  inhaler 2 puffs, Inhalation, Every 6 hours PRN   amLODipine (NORVASC) 5 mg, Oral, Daily   diphenoxylate-atropine (LOMOTIL) 2.5-0.025 MG tablet 1 tablet, Oral, 2 times daily   Fluticasone-Umeclidin-Vilant (TRELEGY ELLIPTA) 200-62.5-25 MCG/ACT AEPB 1 puff, Inhalation, Daily       Objective:   Physical Exam BP 136/76   Pulse (!) 107   Temp 98.2 F (36.8 C) (Oral)   Resp 16   Ht 5\' 6"  (1.676 m)   Wt 158 lb 6 oz (71.8 kg)   SpO2 98%   BMI 25.56 kg/m  General: Well developed, NAD, BMI noted Neck: No  thyromegaly  HEENT:  Normocephalic . Face symmetric, atraumatic Lungs:  CTA B Normal respiratory effort, no intercostal retractions, no accessory muscle use. Heart: RRR,  no murmur.  Abdomen:  Not distended, soft, non-tender. No rebound or rigidity.   Lower extremities: no pretibial edema bilaterally Upper extremities: Mild puffiness without warmness or redness over the knuckles third and fourth, symmetric. Skin: Exposed areas without rash. Not pale. Not jaundice Neurologic:  alert & oriented X3.  Speech normal, gait appropriate for age and unassisted Strength symmetric and appropriate for age.  Psych: Cognition and judgment appear intact.  Cooperative with normal attention span and concentration.  Behavior appropriate. No anxious or depressed appearing.     Assessment    ASSESSMENT Hyperglycemia HTN GERD Hyperlipidemia, high TG Allergies , nasal  Pulmonary: ---COPD ( PFTs 2013 showed ratio of 58, FEV1 of 1.78- 52% without significant bronchodilator response, TLC  was 77%   DLCO 78%) ---Smoker: quit 2020 ---Pulmonary nodule.  Incidentally found PET scan 12-2021.  CT needed 1 year CV: ---Aortic sclerosis per CT 05-2020 ---Coronary CTA, Ca+ Co score 103.0, L main artery, saw cards, probably still a low risk situation.  Plan is CV RF control, see cards note 11/08/2019 DVT L leg dx  05-31-2021   PLAN: Here for CPX Med reconciliation:  -Not taking calcium, magnesium, 123456  or folic acid. -Was rx allopurinol, reason unclear.  Not taking it.  HTN: BP 136/76, on amlodipine, no change, monitor at home.  Labs Hyperglycemia: Checking A1c Chronic diarrhea: Self resolved. Electrolyte disturbances: Not taking calcium or magnesium.  Check Mg level. Weight loss: Resolved. Hand swelling: Slightly swelling over the knuckles, see physical exam, minimal pain, oncology did had x-rays: L: DJD R hand: DJD, question of erosive changes. Uric acid normal.  Would recommend observation for now.  COPD: Not well-controlled, albuterol twice daily, unable to afford Advair. Plan: Trelegy daily, albuterol as needed, reassess in 3 months RTC: 3 months   -Td 2022 - shingrix x2 per pt  - RSV -PNM 23 : 2020 -COVID vax x 2 , rec booster  -CCS:  cscope 07-2016, cscope 09-2021, next per per gi -Prostate ca screening: No symptoms, PSA - Tobacco: Not smoking -Labs: Recent labs reviewed, will get a BMP, FLP, A1c PSA -  Diet and exercise discussed        Electrolyte disturbance: Last electrolytes were normal, now that his diarrhea has substantially decreased I wonder if he is still needs potassium and magnesium supplements.  Check a BMP and magnesium Weight loss: Improving.  Appetite is much better. Chronic diarrhea:  Follow-up by GI and oncology due to elevated chromogranin level. A PET scan showed:  -No well differentiated neuroendocrine tumor identified on skull base to thigh  -. No bowel abnormality or liver metastasis.  -No mesenteric nodularity.  - LEFT upper lobe pulmonary nodule. Recommend follow-up CT in 12 months   Pulmonary nodule: See above, incidental finding, recommend CT follow-up 01-2023 Preventive care: Had a flu and COVID-vaccine. RTC CPX 4 months  HTN: BP 136/86,

## 2022-07-26 ENCOUNTER — Encounter: Payer: Self-pay | Admitting: Internal Medicine

## 2022-07-26 LAB — LIPID PANEL
Cholesterol: 191 mg/dL (ref 0–200)
HDL: 106.5 mg/dL (ref >39.00)
Total CHOL/HDL Ratio: 2
Triglycerides: 600 mg/dL — ABNORMAL HIGH (ref 0.0–149.0)

## 2022-07-26 LAB — BASIC METABOLIC PANEL
BUN: 18 mg/dL (ref 6–23)
CO2: 29 mEq/L (ref 19–32)
Calcium: 9.6 mg/dL (ref 8.4–10.5)
Chloride: 98 mEq/L (ref 96–112)
Creatinine, Ser: 1.02 mg/dL (ref 0.40–1.50)
GFR: 78.89 mL/min (ref >60.00)
Glucose, Bld: 99 mg/dL (ref 70–99)
Potassium: 3.7 mEq/L (ref 3.5–5.1)
Sodium: 139 mEq/L (ref 135–145)

## 2022-07-26 LAB — LDL CHOLESTEROL, DIRECT: Direct LDL: 36 mg/dL

## 2022-07-26 LAB — HEMOGLOBIN A1C: Hgb A1c MFr Bld: 5.4 % (ref 4.6–6.5)

## 2022-07-26 LAB — PSA: PSA: 0.44 ng/mL (ref 0.10–4.00)

## 2022-07-26 LAB — MAGNESIUM: Magnesium: 1.4 mg/dL — ABNORMAL LOW (ref 1.5–2.5)

## 2022-07-26 NOTE — Assessment & Plan Note (Signed)
Here for CPX Med reconciliation:  -Not taking calcium, magnesium, 123456 or folic acid. -Was rx allopurinol by hematology, not taking HTN: BP 136/76, on amlodipine, no change, monitor at home.  Labs Hyperglycemia: Checking A1c Chronic diarrhea: Self resolved. Electrolyte disturbances: Not taking calcium or magnesium.  Check Mg level. Weight loss: Resolved. Hand swelling: puffines over the knuckles, see physical exam, minimal pain, oncology ordered hand x-rays: Left hand DJD, right hand: DJD, question of erosive changes. Uric acid normal.  Recommend observation for now. COPD: Not well-controlled, albuterol twice daily, unable to afford Advair. Plan: Trelegy daily, albuterol as needed, reassess in 3 months RTC: 3 months

## 2022-07-26 NOTE — Assessment & Plan Note (Signed)
-  Td 2022 - shingrix x2 per pt  - RSV: Recommended -PNM 23 : 2020 -COVID vax x 2 , rec booster  -CCS:  cscope 07-2016, cscope 09-2021, next per per gi -Prostate ca screening: No symptoms, PSA - Tobacco: Not smoking -Labs: Recent labs reviewed, will get a BMP, FLP, A1c PSA -  Diet and exercise discussed

## 2022-07-27 NOTE — Addendum Note (Signed)
Addended byDamita Dunnings D on: 07/27/2022 04:16 PM   Modules accepted: Orders

## 2022-07-29 ENCOUNTER — Encounter: Payer: Self-pay | Admitting: Internal Medicine

## 2022-07-29 DIAGNOSIS — J449 Chronic obstructive pulmonary disease, unspecified: Secondary | ICD-10-CM

## 2022-07-31 ENCOUNTER — Emergency Department (HOSPITAL_BASED_OUTPATIENT_CLINIC_OR_DEPARTMENT_OTHER)
Admission: EM | Admit: 2022-07-31 | Discharge: 2022-07-31 | Disposition: A | Discharge status: Home / Self Care | Payer: 59 | Attending: Emergency Medicine | Admitting: Emergency Medicine

## 2022-07-31 ENCOUNTER — Encounter (HOSPITAL_BASED_OUTPATIENT_CLINIC_OR_DEPARTMENT_OTHER): Payer: Self-pay

## 2022-07-31 ENCOUNTER — Emergency Department (HOSPITAL_BASED_OUTPATIENT_CLINIC_OR_DEPARTMENT_OTHER): Payer: 59

## 2022-07-31 ENCOUNTER — Other Ambulatory Visit: Payer: Self-pay

## 2022-07-31 DIAGNOSIS — M25532 Pain in left wrist: Secondary | ICD-10-CM | POA: Insufficient documentation

## 2022-07-31 DIAGNOSIS — J449 Chronic obstructive pulmonary disease, unspecified: Secondary | ICD-10-CM | POA: Diagnosis not present

## 2022-07-31 DIAGNOSIS — M25539 Pain in unspecified wrist: Secondary | ICD-10-CM

## 2022-07-31 DIAGNOSIS — M25531 Pain in right wrist: Secondary | ICD-10-CM | POA: Diagnosis not present

## 2022-07-31 DIAGNOSIS — E790 Hyperuricemia without signs of inflammatory arthritis and tophaceous disease: Secondary | ICD-10-CM | POA: Insufficient documentation

## 2022-07-31 DIAGNOSIS — M109 Gout, unspecified: Secondary | ICD-10-CM | POA: Insufficient documentation

## 2022-07-31 DIAGNOSIS — R600 Localized edema: Secondary | ICD-10-CM | POA: Diagnosis not present

## 2022-07-31 LAB — BASIC METABOLIC PANEL
Anion gap: 10 (ref 5–15)
BUN: 8 mg/dL (ref 8–23)
CO2: 25 mmol/L (ref 22–32)
Calcium: 8.4 mg/dL — ABNORMAL LOW (ref 8.9–10.3)
Chloride: 96 mmol/L — ABNORMAL LOW (ref 98–111)
Creatinine, Ser: 0.92 mg/dL (ref 0.61–1.24)
GFR, Estimated: >60 mL/min (ref >60)
Glucose, Bld: 122 mg/dL — ABNORMAL HIGH (ref 70–99)
Potassium: 3 mmol/L — ABNORMAL LOW (ref 3.5–5.1)
Sodium: 131 mmol/L — ABNORMAL LOW (ref 135–145)

## 2022-07-31 LAB — CBC WITH DIFFERENTIAL/PLATELET
Abs Immature Granulocytes: 0.08 10*3/uL — ABNORMAL HIGH (ref 0.00–0.07)
Basophils Absolute: 0 10*3/uL (ref 0.0–0.1)
Basophils Relative: 0 %
Eosinophils Absolute: 0 10*3/uL (ref 0.0–0.5)
Eosinophils Relative: 0 %
HCT: 32 % — ABNORMAL LOW (ref 39.0–52.0)
Hemoglobin: 11.1 g/dL — ABNORMAL LOW (ref 13.0–17.0)
Immature Granulocytes: 1 %
Lymphocytes Relative: 6 %
Lymphs Abs: 0.8 10*3/uL (ref 0.7–4.0)
MCH: 31.2 pg (ref 26.0–34.0)
MCHC: 34.7 g/dL (ref 30.0–36.0)
MCV: 89.9 fL (ref 80.0–100.0)
Monocytes Absolute: 1.3 10*3/uL — ABNORMAL HIGH (ref 0.1–1.0)
Monocytes Relative: 11 %
Neutro Abs: 10.1 10*3/uL — ABNORMAL HIGH (ref 1.7–7.7)
Neutrophils Relative %: 82 %
Platelets: 371 10*3/uL (ref 150–400)
RBC: 3.56 MIL/uL — ABNORMAL LOW (ref 4.22–5.81)
RDW: 13.5 % (ref 11.5–15.5)
WBC: 12.2 10*3/uL — ABNORMAL HIGH (ref 4.0–10.5)
nRBC: 0 % (ref 0.0–0.2)

## 2022-07-31 LAB — URIC ACID: Uric Acid, Serum: 8.3 mg/dL (ref 3.7–8.6)

## 2022-07-31 LAB — C-REACTIVE PROTEIN: CRP: 21 mg/dL — ABNORMAL HIGH (ref <1.0)

## 2022-07-31 LAB — SEDIMENTATION RATE: Sed Rate: 85 mm/hr — ABNORMAL HIGH (ref 0–16)

## 2022-07-31 MED ORDER — POTASSIUM CHLORIDE CRYS ER 20 MEQ PO TBCR: 20.0000 meq | EXTENDED_RELEASE_TABLET | Freq: Two times a day (BID) | ORAL | 0 refills | Status: DC

## 2022-07-31 MED ORDER — PREDNISONE 50 MG PO TABS
60.0000 mg | ORAL_TABLET | Freq: Once | ORAL | Status: AC
  Administered 2022-07-31: 60 mg via ORAL
  Filled 2022-07-31: qty 1

## 2022-07-31 MED ORDER — INDOMETHACIN 50 MG PO CAPS: 50.0000 mg | ORAL_CAPSULE | Freq: Three times a day (TID) | ORAL | 0 refills | Status: DC | PRN

## 2022-07-31 MED ORDER — HYDROCODONE-ACETAMINOPHEN 5-325 MG PO TABS: 1.0000 | ORAL_TABLET | Freq: Four times a day (QID) | ORAL | 0 refills | Status: DC | PRN

## 2022-07-31 MED ORDER — INDOMETHACIN 25 MG PO CAPS
50.0000 mg | ORAL_CAPSULE | Freq: Once | ORAL | Status: AC
  Administered 2022-07-31: 50 mg via ORAL
  Filled 2022-07-31: qty 2

## 2022-07-31 MED ORDER — POTASSIUM CHLORIDE CRYS ER 20 MEQ PO TBCR
40.0000 meq | EXTENDED_RELEASE_TABLET | Freq: Once | ORAL | Status: AC
  Administered 2022-07-31: 40 meq via ORAL
  Filled 2022-07-31: qty 2

## 2022-07-31 MED ORDER — OXYCODONE-ACETAMINOPHEN 5-325 MG PO TABS
2.0000 | ORAL_TABLET | Freq: Once | ORAL | Status: AC
  Administered 2022-07-31: 2 via ORAL
  Filled 2022-07-31: qty 2

## 2022-07-31 NOTE — ED Provider Notes (Signed)
[name] Provider Note   CSN: [id number] Arrival date & time: 07/31/22  1110     History  Chief Complaint  Patient presents with   Wrist Pain    Richard Gates is a 62 y.o. male.  Patient with a history of COPD, CAD, GERD presenting with bilateral wrist pain.  Woke up yesterday morning with this pain right greater than left.  Denies any trauma.  Both wrist appears swollen and warm.  He is taking Tylenol at home without relief.  Having difficulty moving his hands and fingers.  He is concerned he could have gout.  He states he has had gout in his left foot previously but is not on medication for it.  He was seen by his hematologist last month when he had swelling of his fingers and had x-rays that showed severe arthritis.  He states the swelling of the fingers is still there but improving. He was not told if he had gout in his hands or his wrists but was told he had gout in his foot previously. Denies any fevers, chills, nausea, vomiting.  No chest pain or shortness of breath.  Having difficulty using his hands bilaterally.  No history of diabetes.  No history of CKD. Denies any history of venous depression or chronic prednisone use. Patient apparently follows with hematology due to elevated chromogranin A levels but has no official diagnosis of cancer.  The history is provided by the patient.  Wrist Pain Pertinent negatives include no chest pain, no abdominal pain, no headaches and no shortness of breath.       Home Medications Prior to Admission medications   Medication Sig Start Date End Date Taking? Authorizing Provider  albuterol (VENTOLIN HFA) 108 (90 Base) MCG/ACT inhaler Inhale 2 puffs into the lungs every 6 (six) hours as needed for wheezing or shortness of breath. 02/21/22   Colon Branch, MD  amLODipine (NORVASC) 5 MG tablet Take 1 tablet (5 mg total) by mouth daily. 05/13/22   Colon Branch, MD  diphenoxylate-atropine (LOMOTIL)  2.5-0.025 MG tablet Take 1 tablet by mouth in the morning and at bedtime. Patient not taking: Reported on 07/25/2022 12/08/21   Noralyn Pick, NP  Fluticasone-Umeclidin-Vilant (TRELEGY ELLIPTA) 200-62.5-25 MCG/ACT AEPB Inhale 1 puff into the lungs daily. 07/25/22   Colon Branch, MD      Allergies    Patient has no known allergies.    Review of Systems   Review of Systems  Constitutional:  Negative for activity change, appetite change and fever.  HENT:  Negative for congestion and rhinorrhea.   Respiratory:  Negative for cough, chest tightness and shortness of breath.   Cardiovascular:  Negative for chest pain.  Gastrointestinal:  Negative for abdominal pain, nausea and vomiting.  Genitourinary:  Negative for dysuria and hematuria.  Musculoskeletal:  Positive for arthralgias and myalgias.  Skin:  Negative for rash.  Neurological:  Negative for dizziness, weakness and headaches.   all other systems are negative except as noted in the HPI and PMH.    Physical Exam Updated Vital Signs BP 110/78   Pulse (!) 105   Temp 98.9 F (37.2 C) (Oral)   Resp 17   Ht 5\' 7"  (1.702 m)   SpO2 99%   BMI 24.81 kg/m  Physical Exam Vitals and nursing note reviewed.  Constitutional:      General: He is not in acute distress.    Appearance: He is well-developed.  Comments: uncomfortable  HENT:     Head: Normocephalic and atraumatic.     Mouth/Throat:     Pharynx: No oropharyngeal exudate.  Eyes:     Conjunctiva/sclera: Conjunctivae normal.     Pupils: Pupils are equal, round, and reactive to light.  Neck:     Comments: No meningismus. Cardiovascular:     Rate and Rhythm: Normal rate and regular rhythm.     Heart sounds: Normal heart sounds. No murmur heard. Pulmonary:     Effort: Pulmonary effort is normal. No respiratory distress.     Breath sounds: Normal breath sounds.  Abdominal:     Palpations: Abdomen is soft.     Tenderness: There is no abdominal tenderness. There is no  guarding or rebound.  Musculoskeletal:        General: Swelling and tenderness present.     Cervical back: Normal range of motion and neck supple.     Comments: Bilateral wrists are swollen, tender to palpation and warm but not erythematous.  Reduced range of motion secondary to pain.  Intact radial pulse.  Able to wiggle hands and fingers.  Minimal movement of wrist bilaterally.  Skin:    General: Skin is warm.  Neurological:     Mental Status: He is alert and oriented to person, place, and time.     Cranial Nerves: No cranial nerve deficit.     Motor: No abnormal muscle tone.     Coordination: Coordination normal.     Comments:  5/5 strength throughout. CN 2-12 intact.Equal grip strength.   Psychiatric:        Behavior: Behavior normal.     ED Results / Procedures / Treatments   Labs (all labs ordered are listed, but only abnormal results are displayed) Labs Reviewed  CBC WITH DIFFERENTIAL/PLATELET - Abnormal; Notable for the following components:      Result Value   WBC 12.2 (*)    RBC 3.56 (*)    Hemoglobin 11.1 (*)    HCT 32.0 (*)    Neutro Abs 10.1 (*)    Monocytes Absolute 1.3 (*)    Abs Immature Granulocytes 0.08 (*)    All other components within normal limits  BASIC METABOLIC PANEL - Abnormal; Notable for the following components:   Sodium 131 (*)    Potassium 3.0 (*)    Chloride 96 (*)    Glucose, Bld 122 (*)    Calcium 8.4 (*)    All other components within normal limits  URIC ACID  SEDIMENTATION RATE  C-REACTIVE PROTEIN    EKG None  Radiology DG Wrist Complete Right  Result Date: 07/31/2022 CLINICAL DATA:  Pain and swelling since Saturday without trauma EXAM: RIGHT WRIST - COMPLETE 3+ VIEW COMPARISON:  Hand radiographs of 06/15/2012 FINDINGS: No acute fracture or dislocation. Lateral view is mildly oblique. Suggestion of soft tissue swelling about the wrist on the lateral view. Joint space narrowing at the third metacarpal phalangeal joint was detailed  on 06/15/2022. Similar soft tissue calcifications centered about the thenar eminence and first metacarpal. Joint spaces are maintained about the wrist. IMPRESSION: Suspicion of soft tissue swelling, without acute finding about the right wrist. Electronically Signed   By: Abigail Miyamoto M.D.   On: 07/31/2022 12:01   DG Wrist Complete Left  Result Date: 07/31/2022 CLINICAL DATA:  Wrist swelling.  Pain. EXAM: LEFT WRIST - COMPLETE 3+ VIEW COMPARISON:  06/15/2022 FINDINGS: There is diffuse soft tissue edema, which appears similar to the previous exam. No sign  of acute fracture or dislocation. No focal bone erosions. Remote deformities involving the radial and ulnar styloids. IMPRESSION: 1. Soft tissue edema. 2. No acute bone abnormality. Electronically Signed   By: Kerby Moors M.D.   On: 07/31/2022 12:01    Procedures Procedures    Medications Ordered in ED Medications  oxyCODONE-acetaminophen (PERCOCET/ROXICET) 5-325 MG per tablet 2 tablet (has no administration in time range)  predniSONE (DELTASONE) tablet 60 mg (has no administration in time range)    ED Course/ Medical Decision Making/ A&P                             Medical Decision Making Amount and/or Complexity of Data Reviewed Labs: ordered. Decision-making details documented in ED Course. Radiology: ordered and independent interpretation performed. Decision-making details documented in ED Course. ECG/medicine tests: ordered and independent interpretation performed. Decision-making details documented in ED Course.  Risk Prescription drug management.  2 days of bilateral wrist pain, suspicious for gout.  Does have intact distal pulses.  Chart review shows history of previous joint issues including left foot and bilateral fingers.  Patient states he believes he was told he had gout in his foot previously but never any other joints.  Denies any underlying rheumatological conditions but it appears he is being considered by his PCP and  hematologist.  No fever.  Low suspicion for bilateral septic joint.  X rays are negative for fracture but does show soft tissue swelling.  Results reviewed and interpreted by me.  Leukocytosis of 12 appears to be chronic.  No fever.  Uric acid level is normal.  ESR and CRP are pending.  Bilateral wrist pain and swelling likely secondary to gout.  Doubt septic joint bilaterally with lack of fever.  No overlying erythema.  Will treat with prednisone, pain control and anti-inflammatories.  Arthrocentesis discussed with patient.  He agrees to defer at this time given bilateral nature of his pain as well as lack of redness and erythema and fever.  Will treat for suspected gout with anti-inflammatories and pain control.  His kidney function is normal.  Will replace his potassium.  Follow-up with PCP.  Return precautions discussed including worsening pain, redness over the joint, unable to move his wrist or any other concerns.        Final Clinical Impression(s) / ED Diagnoses Final diagnoses:  Acute wrist pain, unspecified laterality    Rx / DC Orders ED Discharge Orders     None         Violet Cart, Annie Main, MD 07/31/22 1308

## 2022-07-31 NOTE — Discharge Instructions (Addendum)
As we discussed, we suspect you have gout.  Take the anti-inflammatory medications and pain medication as prescribed.  Do not drive or operate heavy machinery when you are taking the Norco. As we also discussed infection cannot be ruled out without putting a needle into the joint.  He declined this today.  Follow-up with your primary doctor.  Return to the ED sooner with worsening pain, high fever, redness over the joints or any other concerns.

## 2022-07-31 NOTE — ED Triage Notes (Signed)
Pt ambulatory to triage. Reports bilateral wrist pain and swelling since Saturday. No known injury. Attempted to assess pulses. Pt screamed out in pain.

## 2022-07-31 NOTE — ED Notes (Signed)
Unable to sign waiver

## 2022-08-03 NOTE — Telephone Encounter (Signed)
Tammy good morning, he was unable to afford Advair and now on Trelegy.  Please assist getting him a  inhaler he cannot afford. Thank you

## 2022-08-04 ENCOUNTER — Other Ambulatory Visit: Payer: 59 | Admitting: Pharmacist

## 2022-08-04 ENCOUNTER — Encounter: Payer: Self-pay | Admitting: Pharmacist

## 2022-08-04 MED ORDER — TIOTROPIUM BROMIDE MONOHYDRATE 18 MCG IN CAPS: ORAL_CAPSULE | RESPIRATORY_TRACT | 3 refills | Status: DC

## 2022-08-04 MED ORDER — FLUTICASONE-SALMETEROL 232-14 MCG/ACT IN AEPB: 1.0000 | INHALATION_SPRAY | Freq: Two times a day (BID) | RESPIRATORY_TRACT | 2 refills | Status: DC

## 2022-08-04 MED ORDER — BUDESONIDE-FORMOTEROL FUMARATE 160-4.5 MCG/ACT IN AERO: 2.0000 | INHALATION_SPRAY | Freq: Two times a day (BID) | RESPIRATORY_TRACT | 3 refills | Status: DC

## 2022-08-04 MED ORDER — TRELEGY ELLIPTA 200-62.5-25 MCG/ACT IN AEPB: 1.0000 | INHALATION_SPRAY | Freq: Every day | RESPIRATORY_TRACT | 0 refills | Status: AC

## 2022-08-04 NOTE — Progress Notes (Signed)
08/04/2022 Name: Richard Gates MRN: [id number] DOB: [birth year]  Chief Complaint  Patient presents with   Medication Management    Trelegy cost too high    Richard Gates is a 62 y.o. year old male who presented for a telephone visit.   They were referred to the pharmacist by their PCP for assistance in managing hypertension and medication access.   Subjective:  Care Team: Primary Care Provider: Colon Branch, MD ; Next Scheduled Visit: 10/25/2022 Hematologist - Dr Mamie Nick. Ennever - 08/22/2022  Medication Access/Adherence  Current Pharmacy:  [id number] [place] [place] - [address] [place] [address] Phone: [phone] Fax: [phone]   Patient reports affordability concerns with their medications: Yes  Patient reports access/transportation concerns to their pharmacy: No  Patient reports adherence concerns with their medications:  No      Hypertension:  Current medications: amlodipine 5mg  daily   BP Readings from Last 3 Encounters:  07/31/22 110/78  07/25/22 136/76  07/11/22 (!) 142/65     Patient denies hypotensive s/sx including no dizziness, lightheadedness.  Patient denies hypertensive symptoms including no headache, chest pain, shortness of breath  COPD:  Current medications: Trelegy once daily - not taking due to cost. Per patient Trelegy was over $100 even after manufacturer coupon.   Medications tried in the past: Advair   Reports no exacerbations in the past year   Objective:  Lab Results  Component Value Date   HGBA1C 5.4 07/25/2022    Lab Results  Component Value Date   CREATININE 0.92 07/31/2022   BUN 8 07/31/2022   NA 131 (L) 07/31/2022   K 3.0 (L) 07/31/2022   CL 96 (L) 07/31/2022   CO2 25 07/31/2022    Lab Results  Component Value Date   CHOL 191 07/25/2022   HDL 106.50 07/25/2022   LDLCALC 68 04/12/2019   LDLDIRECT 36.0 07/25/2022   TRIG (H) 07/25/2022    600.0  Triglyceride is over 400; calculations on Lipids are invalid.   CHOLHDL 2 07/25/2022    Medications Reviewed Today     Reviewed by Cherre Robins, RPH-CPP (Pharmacist) on 08/04/22 at [phone]  Med List Status: <None>   Medication Order Taking? Sig Documenting Provider Last Dose Status Informant  albuterol (VENTOLIN HFA) 108 (90 Base) MCG/ACT inhaler [id number] Yes Inhale 2 puffs into the lungs every 6 (six) hours as needed for wheezing or shortness of breath. Colon Branch, MD Taking Active   amLODipine (NORVASC) 5 MG tablet [id number] Yes Take 1 tablet (5 mg total) by mouth daily. Colon Branch, MD Taking Active   diphenoxylate-atropine (LOMOTIL) 2.5-0.025 MG tablet [id number] Yes Take 1 tablet by mouth in the morning and at bedtime. Noralyn Pick, NP Taking Active   Fluticasone-Umeclidin-Vilant [facility] ELLIPTA) 200-62.5-25 MCG/ACT AEPB [id number] No Inhale 1 puff into the lungs daily.  Patient not taking: Reported on 08/04/2022   Colon Branch, MD Not Taking Active   HYDROcodone-acetaminophen (NORCO/VICODIN) 5-325 MG tablet [id number] Yes Take 1 tablet by mouth every 6 (six) hours as needed for moderate pain. Ezequiel Essex, MD Taking Active   indomethacin (INDOCIN) 50 MG capsule [id number] Yes Take 1 capsule (50 mg total) by mouth 3 (three) times daily as needed. Ezequiel Essex, MD Taking Active   potassium chloride SA (KLOR-CON M) 20 MEQ tablet [id number] Yes Take 1 tablet (20 mEq total) by mouth 2 (two) times daily. Ezequiel Essex, MD Taking Active  Assessment/Plan:   Hypertension: Last 2 office blood pressure's have been at goal of < 140/90 - Continue amlodipine 5mg  daily - reminded to follow up low sodium  / heart health diet.   COPD:Currently unable to continue prescribed therapy due to medication cost.  - Researched patient's [facility] [employer] plan. 2024 formulary has Trelegy as preferred brand but cost to patient is 35% of med cost which is $269 /  month. Manufacturer coupon only lowered cost to about $100 / month.  - Tried to Changed Trelegy to 2 inhalers that are preferred generic on his formulary. Tiotropium (generic Spiriva) is long actinn anti-muscarenic agent and Budesonide / formoterol (generic Symbicort) is combo of inhaler corticosteroid and long acting beta agonist agents. Cost should have been be $25 / month for each inhaler per his formulary but [facility] and cost was > $200.  Called his insurance / [employer] plan. Patient has a $6500 deductible to meet for medical and prescriptions. He is still > $2000 away from meeting this deductible. [employer]line pilot / [facility] [employer] representative check the low cost med list and there are not inhalers / COPD treatments on the $0 copay list that is exempt from deductible.   Since has has deductible plan - provided with 28 days of Trelegy 1 puff daily samples. He will then switch to AirDuo inhaler 1 puff twice a day. [facility] With [facility] card cost should be $33.82.   Patient has started a new job and his medical benefits will start around 10/11/2022.  Plan to follow up with him in June to see if Trelegy is covered on new plan.    Cherre Robins, PharmD Clinical Pharmacist [facility]

## 2022-08-22 ENCOUNTER — Inpatient Hospital Stay: Payer: BC Managed Care – PPO

## 2022-08-22 ENCOUNTER — Ambulatory Visit: Payer: 59 | Admitting: Hematology & Oncology

## 2022-09-01 ENCOUNTER — Other Ambulatory Visit: Payer: 59

## 2022-09-01 ENCOUNTER — Ambulatory Visit: Payer: 59 | Admitting: Hematology & Oncology

## 2022-10-06 ENCOUNTER — Encounter: Payer: Self-pay | Admitting: Internal Medicine

## 2022-10-11 ENCOUNTER — Encounter: Payer: Self-pay | Admitting: Internal Medicine

## 2022-10-11 ENCOUNTER — Other Ambulatory Visit (INDEPENDENT_AMBULATORY_CARE_PROVIDER_SITE_OTHER): Payer: 59 | Admitting: Pharmacist

## 2022-10-11 DIAGNOSIS — E781 Pure hyperglyceridemia: Secondary | ICD-10-CM

## 2022-10-11 DIAGNOSIS — E790 Hyperuricemia without signs of inflammatory arthritis and tophaceous disease: Secondary | ICD-10-CM

## 2022-10-11 DIAGNOSIS — I10 Essential (primary) hypertension: Secondary | ICD-10-CM

## 2022-10-11 DIAGNOSIS — J449 Chronic obstructive pulmonary disease, unspecified: Secondary | ICD-10-CM

## 2022-10-11 MED ORDER — BREZTRI AEROSPHERE 160-9-4.8 MCG/ACT IN AERO: 2.0000 | INHALATION_SPRAY | Freq: Two times a day (BID) | RESPIRATORY_TRACT | 1 refills | Status: DC

## 2022-10-11 MED ORDER — FLUTICASONE-SALMETEROL 500-50 MCG/ACT IN AEPB: 1.0000 | INHALATION_SPRAY | Freq: Two times a day (BID) | RESPIRATORY_TRACT | 1 refills | Status: DC

## 2022-10-11 MED ORDER — AMLODIPINE BESYLATE 5 MG PO TABS: 5.0000 mg | ORAL_TABLET | Freq: Every day | ORAL | 0 refills | Status: DC

## 2022-10-11 NOTE — Progress Notes (Signed)
Addedum: verified cost for Wixela after discount coupon was $10. Patient called and explained directions and new inhaler device.

## 2022-10-11 NOTE — Progress Notes (Signed)
10/11/2022 Name: Richard Gates MRN: [id number] DOB: [birth year]  Chief Complaint  Patient presents with   Medication Management    Richard Gates is a 62 y.o. year old male who presented for a telephone visit.   They were referred to the pharmacist by their PCP for assistance in managing hypertension and medication access.   Subjective:  Care Team: Primary Care Provider: Wanda Plump, MD ; Next Scheduled Visit: 10/25/2022 Hematologist - Dr Demetrius Charity. Ennever - 10/12/2022  Medication Access/Adherence  Current Pharmacy:  [id number] - [place] - [address] AT [facility] [address] [place]  [address] Phone: [phone] Fax: [phone]   Patient reports affordability concerns with their medications: Yes  - patient has new BCBS plan thru his employer. Pharmacy benefits are with [employer]. Patient reports access/transportation concerns to their pharmacy: No  Patient reports adherence concerns with their medications:  Yes - due to cost     Hypertension:  Current medications: amlodipine 5mg  daily  - LR was for only 30 day 05/13/2022  BP Readings from Last 3 Encounters:  07/31/22 110/78  07/25/22 136/76  07/11/22 (!) 142/65   Not checking blood pressure at home currently  Patient denies hypotensive s/sx including no dizziness, lightheadedness.  Patient denies hypertensive symptoms including no headache, chest pain, shortness of breath  COPD:  Current maintenance inhaler:  AirDuo - prescribed but patient never started (cost with GoodRx card would have been $33.82) Rescue medication: albuterol inhaler - use up to 4 times a day if needed.   Was given sample of Trelegy at last visit and was instructed to start AirDuo when completed Trelegy. Patient was not able to start Trelegy due to cost of over $100 even after using manufacturer coupon.   Medications tried in the past: Advair   Reports that he has been using  his Albuterol / rescue inhaler more frequently lately. Two to 4 times a day.   Gout / elevated uric acid:  Currently therapy - None Seen in ED for bilateral wrist pain on 07/31/2022. Noted that uric acid was 8.3. Given oxycodone, prednisone 60mg , potassium 40 mEq and indomethacin 50mg  in the ED. Prescribed hydrocodone 5/325mg  #10, indomethacin 50mg  and potassium 20 mEq bid #6  Limiting food that can increase uric acid - No Alcohol use - Yes, usually just on weekends.   Also noted that potassium was low.   Hypertriglyceridemia  Currently therapy: none Last Tg were 600. Patient is past due to recheck lipids.  Diet - not following any particular diet at this time.   Objective:  Lab Results  Component Value Date   HGBA1C 5.4 07/25/2022    Lab Results  Component Value Date   CREATININE 0.92 07/31/2022   BUN 8 07/31/2022   NA 131 (L) 07/31/2022   K 3.0 (L) 07/31/2022   CL 96 (L) 07/31/2022   CO2 25 07/31/2022    Lab Results  Component Value Date   CHOL 191 07/25/2022   HDL 106.50 07/25/2022   LDLCALC 68 04/12/2019   LDLDIRECT 36.0 07/25/2022   TRIG (H) 07/25/2022    600.0 Triglyceride is over 400; calculations on Lipids are invalid.   CHOLHDL 2 07/25/2022    Medications Reviewed Today     Reviewed by Henrene Pastor, RPH-CPP (Pharmacist) on 10/11/22 at 1034  Med List Status: <None>   Medication Order Taking? Sig Documenting Provider Last Dose Status Informant  albuterol (VENTOLIN  HFA) 108 (90 Base) MCG/ACT inhaler [id number] Yes Inhale 2 puffs into the lungs every 6 (six) hours as needed for wheezing or shortness of breath. Wanda Plump, MD Taking Active   amLODipine (NORVASC) 5 MG tablet [id number] Yes Take 1 tablet (5 mg total) by mouth daily. Wanda Plump, MD Taking Active   diphenoxylate-atropine (LOMOTIL) 2.5-0.025 MG tablet [id number] No Take 1 tablet by mouth in the morning and at bedtime.  Patient not taking: Reported on 10/11/2022   Arnaldo Natal, NP Not  Taking Active   Fluticasone-Salmeterol [facility] [place] 232/14) 232-14 MCG/ACT AEPB [id number] No Inhale 1 puff into the lungs in the morning and at bedtime. Start after you complete Trelegy samples.  Patient not taking: Reported on 10/11/2022   Wanda Plump, MD Not Taking Active   indomethacin (INDOCIN) 50 MG capsule [id number]  Take 1 capsule (50 mg total) by mouth 3 (three) times daily as needed. Rancour, Jeannett Senior, MD  Active   potassium chloride SA (KLOR-CON M) 20 MEQ tablet [id number] No Take 1 tablet (20 mEq total) by mouth 2 (two) times daily.  Patient not taking: Reported on 10/11/2022   Glynn Octave, MD Not Taking Active               Assessment/Plan:  Medication Management:  Called [employer] to check patient's insurance coverage. Patient's new insurance has a deductible of $3500 to meet but it seems to cover at least a portion of medication costs.   Hypertension: Last 2 office blood pressure reading have been at goal of < 140/90 - Continue amlodipine 5mg  daily - updated Rx at [facility] per patient preference.  - reminded to follow up low sodium  / heart health diet.   COPD:Currently unable to continue prescribed therapy due to medication cost.  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 Per [employer] rep cost would be $126 / month but there is a discount card he could use for Breztri that might lower cost to $0 (see Addendum below)   Other option would be to change to Wixela - 30 day supply is $62 with [employer]. AirDuo inhaler 1 puff twice a day is also an option and he could get with GoodRx card for about $35 / month.  Gout / elevated uric acid:  Patient [facility]. Will see if he can have uric acid checked with labs ordered by Dr Myna Hidalgo so Dr Drue Novel will have results for upcoming appt 10/25/2022 Consider preventative therapy if  patient continues to have gout exacerbations.  Discussed that alcohol use can increase gout risk. Mailed patient information on foods that can increase uric acid. He should limit / avoid these foods and alcohol.   Hypokalemia:  Need to recheck CMP or BMP - looks like [facility] plans to check tomorrow prior to appointment with Dr Myna Hidalgo.   Hypertriglyceridemia  Discussed that alcohol can also increase triglycerides.  Patient reminded that he is past due to have lipids rechecked. He will ask to see if they can include in labs for tomorrow. If not will check 10/25/2022 when he is in our office.    Henrene Pastor, PharmD Clinical Pharmacist [facility] [place]  10/11/2022 Addendum - called [facility] and cost of Markus Daft was $40. They were unable to get the coupon card for Breztri to work with [employer]. Called patient about $  40 copay. He would like to try generic Wixela instead to start. Sent Rx for Wixela 500/69mcg - inhale 1 puff into lungs twice a day. Also provided coupon card to lower to $10.

## 2022-10-12 ENCOUNTER — Other Ambulatory Visit: Payer: Self-pay

## 2022-10-12 ENCOUNTER — Encounter: Payer: Self-pay | Admitting: Hematology & Oncology

## 2022-10-12 ENCOUNTER — Inpatient Hospital Stay: Payer: BC Managed Care – PPO | Attending: Hematology & Oncology

## 2022-10-12 ENCOUNTER — Inpatient Hospital Stay (HOSPITAL_BASED_OUTPATIENT_CLINIC_OR_DEPARTMENT_OTHER): Payer: BC Managed Care – PPO | Admitting: Hematology & Oncology

## 2022-10-12 VITALS — BP 157/68 | HR 83 | Temp 98.3°F | Resp 17 | Ht 66.0 in | Wt 154.1 lb

## 2022-10-12 DIAGNOSIS — R978 Other abnormal tumor markers: Secondary | ICD-10-CM

## 2022-10-12 DIAGNOSIS — Z862 Personal history of diseases of the blood and blood-forming organs and certain disorders involving the immune mechanism: Secondary | ICD-10-CM | POA: Diagnosis not present

## 2022-10-12 DIAGNOSIS — E790 Hyperuricemia without signs of inflammatory arthritis and tophaceous disease: Secondary | ICD-10-CM

## 2022-10-12 DIAGNOSIS — D500 Iron deficiency anemia secondary to blood loss (chronic): Secondary | ICD-10-CM

## 2022-10-12 DIAGNOSIS — R7989 Other specified abnormal findings of blood chemistry: Secondary | ICD-10-CM | POA: Diagnosis not present

## 2022-10-12 DIAGNOSIS — D530 Protein deficiency anemia: Secondary | ICD-10-CM

## 2022-10-12 LAB — CMP (CANCER CENTER ONLY)
ALT: 5 U/L (ref 0–44)
AST: 13 U/L — ABNORMAL LOW (ref 15–41)
Albumin: 4.4 g/dL (ref 3.5–5.0)
Alkaline Phosphatase: 76 U/L (ref 38–126)
Anion gap: 11 (ref 5–15)
BUN: 15 mg/dL (ref 8–23)
CO2: 28 mmol/L (ref 22–32)
Calcium: 9.7 mg/dL (ref 8.9–10.3)
Chloride: 102 mmol/L (ref 98–111)
Creatinine: 0.92 mg/dL (ref 0.61–1.24)
GFR, Estimated: >60 mL/min (ref >60)
Glucose, Bld: 103 mg/dL — ABNORMAL HIGH (ref 70–99)
Potassium: 3.4 mmol/L — ABNORMAL LOW (ref 3.5–5.1)
Sodium: 141 mmol/L (ref 135–145)
Total Bilirubin: 0.6 mg/dL (ref 0.3–1.2)
Total Protein: 7.3 g/dL (ref 6.5–8.1)

## 2022-10-12 LAB — CBC WITH DIFFERENTIAL (CANCER CENTER ONLY)
Abs Immature Granulocytes: 0.04 10*3/uL (ref 0.00–0.07)
Basophils Absolute: 0.1 10*3/uL (ref 0.0–0.1)
Basophils Relative: 1 %
Eosinophils Absolute: 0.4 10*3/uL (ref 0.0–0.5)
Eosinophils Relative: 5 %
HCT: 35.7 % — ABNORMAL LOW (ref 39.0–52.0)
Hemoglobin: 11.9 g/dL — ABNORMAL LOW (ref 13.0–17.0)
Immature Granulocytes: 1 %
Lymphocytes Relative: 27 %
Lymphs Abs: 2.1 10*3/uL (ref 0.7–4.0)
MCH: 30.6 pg (ref 26.0–34.0)
MCHC: 33.3 g/dL (ref 30.0–36.0)
MCV: 91.8 fL (ref 80.0–100.0)
Monocytes Absolute: 0.8 10*3/uL (ref 0.1–1.0)
Monocytes Relative: 11 %
Neutro Abs: 4.3 10*3/uL (ref 1.7–7.7)
Neutrophils Relative %: 55 %
Platelet Count: 382 10*3/uL (ref 150–400)
RBC: 3.89 MIL/uL — ABNORMAL LOW (ref 4.22–5.81)
RDW: 15.2 % (ref 11.5–15.5)
WBC Count: 7.7 10*3/uL (ref 4.0–10.5)
nRBC: 0 % (ref 0.0–0.2)

## 2022-10-12 LAB — LACTATE DEHYDROGENASE: LDH: 162 U/L (ref 98–192)

## 2022-10-12 LAB — SAVE SMEAR(SSMR), FOR PROVIDER SLIDE REVIEW

## 2022-10-12 NOTE — Progress Notes (Signed)
Hematology and Oncology Follow Up Visit  Richard Gates [id number] 23-May-1960 62 y.o. 10/12/2022   Principle Diagnosis:  Ferratin elevated in past, hemochromatosis DNA negative Elevated Chromogranin A  Current Therapy: Observation   Interim History:  Richard Gates is here today for follow-up.  He is doing okay.  He has a new job.  Hopefully, he will enjoy his new job.  He works for [employer].  He has had no issues with nausea or vomiting.  Has had some breathing issues.  He is on some inhalers.  I told him to just to be careful with the hot humid weather that we are going to be getting soon.  He really needs to try to stay inside.  When we last saw him back in February, his ferritin was 364 with an iron saturation of 20%.  This has been coming down.  His last Chromogranin A level was 127.  He has had no problems with bowels or bladder.  He has had no issues with diarrhea.  He has had no problems with COVID.  There is been no bleeding.  He has had no rashes.  Overall, I would say that his performance status is probably Richard Gates.   Medications:  Allergies as of 10/12/2022   No Known Allergies      Medication List        Accurate as of October 12, 2022  4:09 PM. If you have any questions, ask your nurse or doctor.          albuterol 108 (90 Base) MCG/ACT inhaler Commonly known as: VENTOLIN HFA Inhale 2 puffs into the lungs every 6 (six) hours as needed for wheezing or shortness of breath.   amLODipine 5 MG tablet Commonly known as: NORVASC Take Gates tablet (5 mg total) by mouth daily.   Breztri Aerosphere 160-9-4.8 MCG/ACT Aero Generic drug: Budeson-Glycopyrrol-Formoterol Inhale into the lungs.   diphenoxylate-atropine 2.5-0.025 MG tablet Commonly known as: LOMOTIL Take Gates tablet by mouth in the morning and at bedtime.   fluticasone-salmeterol 500-50 MCG/ACT Aepb Commonly known as: Wixela Inhub Inhale Gates puff into the lungs in the morning and at bedtime. Rinse  mouth after each use.   indomethacin 50 MG capsule Commonly known as: INDOCIN Take Gates capsule (50 mg total) by mouth 3 (three) times daily as needed.   potassium chloride SA 20 MEQ tablet Commonly known as: KLOR-CON M Take Gates tablet (20 mEq total) by mouth 2 (two) times daily.        Allergies: No Known Allergies  Past Medical History, Surgical history, Social history, and Family History were reviewed and updated.  Review of Systems: Review of Systems  Constitutional: Negative.   HENT: Negative.    Eyes: Negative.   Respiratory: Negative.    Cardiovascular: Negative.   Gastrointestinal:  Positive for diarrhea.  Genitourinary: Negative.   Musculoskeletal: Negative.   Skin: Negative.   Neurological: Negative.   Endo/Heme/Allergies: Negative.   Psychiatric/Behavioral: Negative.       Physical Exam:  height is 5\' 6"  (Gates.676 m) and weight is 154 lb Gates.9 oz (69.9 kg). His oral temperature is 98.3 F (36.8 C). His blood pressure is 157/68 (abnormal) and his pulse is 83. His respiration is 17 and oxygen saturation is 96%.   Wt Readings from Last 3 Encounters:  10/12/22 154 lb Gates.9 oz (69.9 kg)  07/25/22 158 lb 6 oz (71.8 kg)  07/11/22 155 lb (70.3 kg)   Physical Exam Vitals reviewed.  HENT:  Head: Normocephalic and atraumatic.  Eyes:     Pupils: Pupils are equal, round, and reactive to light.  Cardiovascular:     Rate and Rhythm: Normal rate and regular rhythm.     Heart sounds: Normal heart sounds.  Pulmonary:     Effort: Pulmonary effort is normal.     Breath sounds: Normal breath sounds.  Abdominal:     General: Bowel sounds are normal.     Palpations: Abdomen is soft.  Musculoskeletal:        General: No tenderness or deformity. Normal range of motion.     Cervical back: Normal range of motion.  Lymphadenopathy:     Cervical: No cervical adenopathy.  Skin:    General: Skin is warm and dry.     Findings: No erythema or rash.  Neurological:     Mental  Status: He is alert and oriented to person, place, and time.  Psychiatric:        Behavior: Behavior normal.        Thought Content: Thought content normal.        Judgment: Judgment normal.     Lab Results  Component Value Date   WBC 7.7 10/12/2022   HGB 11.9 (L) 10/12/2022   HCT 35.7 (L) 10/12/2022   MCV 91.8 10/12/2022   PLT 382 10/12/2022   Lab Results  Component Value Date   FERRITIN 364 (H) 06/15/2022   IRON 58 06/15/2022   TIBC 290 06/15/2022   UIBC 232 06/15/2022   IRONPCTSAT 20 06/15/2022   Lab Results  Component Value Date   RETICCTPCT 2.9 06/15/2022   RBC 3.89 (L) 10/12/2022   No results found for: "KPAFRELGTCHN", "LAMBDASER", "KAPLAMBRATIO" Lab Results  Component Value Date   IGGSERUM 990 10/01/2021   No results found for: "TOTALPROTELP", "ALBUMINELP", "A1GS", "A2GS", "BETS", "BETA2SER", "GAMS", "MSPIKE", "SPEI"   Chemistry      Component Value Date/Time   NA 141 10/12/2022 1503   NA 140 01/19/2022 0000   K 3.4 (L) 10/12/2022 1503   CL 102 10/12/2022 1503   CO2 28 10/12/2022 1503   BUN 15 10/12/2022 1503   BUN 11 01/19/2022 0000   CREATININE 0.92 10/12/2022 1503   CREATININE 0.96 03/18/2022 1627   GLU 106 01/19/2022 0000      Component Value Date/Time   CALCIUM 9.7 10/12/2022 1503   ALKPHOS 76 10/12/2022 1503   AST 13 (L) 10/12/2022 1503   ALT 5 10/12/2022 1503   BILITOT 0.6 10/12/2022 1503       Impression and Plan: Richard Gates is a very pleasant 62 yo African-American gentleman.  Again, his ferritin is coming down nicely.  His Chromogranin A level is also doing quite well.  I do not think we have to do any scans on him right now.  For right now, I think we try to get him through the summertime.  It is always fun talking to Richard Gates.  He is always interesting to talk to.   Richard Macho, MD 6/12/20244:09 PM

## 2022-10-13 ENCOUNTER — Other Ambulatory Visit (INDEPENDENT_AMBULATORY_CARE_PROVIDER_SITE_OTHER): Payer: BC Managed Care – PPO

## 2022-10-13 ENCOUNTER — Other Ambulatory Visit: Payer: 59 | Admitting: Pharmacist

## 2022-10-13 DIAGNOSIS — E790 Hyperuricemia without signs of inflammatory arthritis and tophaceous disease: Secondary | ICD-10-CM

## 2022-10-13 DIAGNOSIS — E785 Hyperlipidemia, unspecified: Secondary | ICD-10-CM | POA: Diagnosis not present

## 2022-10-13 LAB — LIPID PANEL
Cholesterol: 239 mg/dL — ABNORMAL HIGH (ref 0–200)
HDL: 96 mg/dL (ref >39.00)
LDL Cholesterol: 107 mg/dL — ABNORMAL HIGH (ref 0–99)
NonHDL: 143.26
Total CHOL/HDL Ratio: 2
Triglycerides: 180 mg/dL — ABNORMAL HIGH (ref 0.0–149.0)
VLDL: 36 mg/dL (ref 0.0–40.0)

## 2022-10-13 LAB — URIC ACID: Uric Acid, Serum: 11.1 mg/dL — ABNORMAL HIGH (ref 4.0–7.8)

## 2022-10-14 LAB — CHROMOGRANIN A: Chromogranin A (ng/mL): 68.8 ng/mL (ref 0.0–101.8)

## 2022-10-14 MED ORDER — COLCHICINE 0.6 MG PO TABS: 0.6000 mg | ORAL_TABLET | Freq: Two times a day (BID) | ORAL | 0 refills | Status: DC

## 2022-10-14 MED ORDER — ALLOPURINOL 200 MG PO TABS: 1.0000 | ORAL_TABLET | Freq: Every day | ORAL | 1 refills | Status: DC

## 2022-10-14 NOTE — Addendum Note (Signed)
Addended byConrad [employer] D on: 10/14/2022 08:56 AM   Modules accepted: Orders

## 2022-10-17 ENCOUNTER — Ambulatory Visit (INDEPENDENT_AMBULATORY_CARE_PROVIDER_SITE_OTHER): Payer: BC Managed Care – PPO | Admitting: Pharmacist

## 2022-10-17 ENCOUNTER — Telehealth: Payer: Self-pay

## 2022-10-17 DIAGNOSIS — M109 Gout, unspecified: Secondary | ICD-10-CM

## 2022-10-17 MED ORDER — ALLOPURINOL 300 MG PO TABS: 300.0000 mg | ORAL_TABLET | Freq: Every day | ORAL | 1 refills | Status: DC

## 2022-10-17 NOTE — Telephone Encounter (Signed)
Noted, thank you

## 2022-10-17 NOTE — Progress Notes (Signed)
10/17/2022 Name: Richard Gates MRN: [id number] DOB: 01/08/1961  Chief Complaint  Patient presents with   Medication Management      Subjective: Richard Gates is a 62 y.o. year old male who presented for a telephone visit. Patient called to report he has not been notified by [facility] or our office if his medication for gout has been filled or not. Looks like allopurinol 200mg  needed prior authorization. His renal function is good and allopurinol 300mg  is much more cost effective compared to 200mg .  #30 of Allopurinol 200mg  cash price is $169 #30 of Allopurinol 300mg  cash price is $15.87  Care Team: Primary Care Provider: Wanda Plump, MD ; Next Scheduled Visit: 10/25/2022 Hematologist - Dr Demetrius Charity. Ennever - 10/12/2022  Medication Access/Adherence  Current Pharmacy:  [id number] - [place], [place] - [address] AT [facility] [address] [place] [address] Phone: [phone] Fax: [phone]   Patient reports affordability concerns with their medications: Yes  - has improved with recent med adjustment Patient reports access/transportation concerns to their pharmacy: No  Patient reports adherence concerns with their medications:  Yes - prescriptions have not been filled yet for gout    Gout / elevated uric acid:  Currently therapy - None Seen in ED for bilateral wrist pain on 07/31/2022. Noted that uric acid was 8.3. Given oxycodone, prednisone 60mg , potassium 40 mEq and indomethacin 50mg  in the ED. Prescribed hydrocodone 5/325mg  #10, indomethacin 50mg  and potassium 20 mEq bid #6  Rechecked uric acid last week and was still elevated.    Objective:  Lab Results  Component Value Date   HGBA1C 5.4 07/25/2022    Lab Results  Component Value Date   CREATININE 0.92 10/12/2022   BUN 15 10/12/2022   NA 141 10/12/2022   K 3.4 (L) 10/12/2022   CL 102 10/12/2022   CO2 28 10/12/2022    Lab Results  Component Value Date   CHOL  239 (H) 10/13/2022   HDL 96.00 10/13/2022   LDLCALC 107 (H) 10/13/2022   LDLDIRECT 36.0 07/25/2022   TRIG 180.0 (H) 10/13/2022   CHOLHDL 2 10/13/2022    Medications Reviewed Today     Reviewed by Cooper Render, RN (Registered Nurse) on 10/12/22 at 1530  Med List Status: <None>   Medication Order Taking? Sig Documenting Provider Last Dose Status Informant  albuterol (VENTOLIN HFA) 108 (90 Base) MCG/ACT inhaler [id number] Yes Inhale 2 puffs into the lungs every 6 (six) hours as needed for wheezing or shortness of breath. Wanda Plump, MD Taking Active   amLODipine (NORVASC) 5 MG tablet [id number] Yes Take 1 tablet (5 mg total) by mouth daily. Wanda Plump, MD Taking Active   BREZTRI AEROSPHERE 160-9-4.8 MCG/ACT AERO [id number] Yes Inhale into the lungs. [provider] Taking Active   diphenoxylate-atropine (LOMOTIL) 2.5-0.025 MG tablet [id number] No Take 1 tablet by mouth in the morning and at bedtime.  Patient not taking: Reported on 10/11/2022   Arnaldo Natal, NP Not Taking Active   fluticasone-salmeterol [facility] INHUB) 500-50 MCG/ACT AEPB [id number] Yes Inhale 1 puff into the lungs in the morning and at bedtime. Rinse mouth after each use. Wanda Plump, MD Taking Active   indomethacin (INDOCIN) 50 MG capsule [id number] No Take 1 capsule (50 mg total) by mouth 3 (three) times daily as needed.  Patient not taking: Reported on 10/12/2022   Glynn Octave, MD Not Taking Active  potassium chloride SA (KLOR-CON M) 20 MEQ tablet [id number] No Take 1 tablet (20 mEq total) by mouth 2 (two) times daily.  Patient not taking: Reported on 10/11/2022   Glynn Octave, MD Not Taking Active               Assessment/Plan:  Gout / elevated uric acid:  Called [facility]. They have filled colchicine but allopurinol 200mg  needs prior authorization.  Will change to allopurinol 300mg  daily since is more cost effective.    Henrene Pastor, PharmD Clinical Pharmacist [facility]

## 2022-10-17 NOTE — Telephone Encounter (Signed)
PA initiated via Covermymeds; KEY: [id number]. Awaiting determination.

## 2022-10-17 NOTE — Telephone Encounter (Signed)
Since patient has good renal function, changed to allopurinol 300mg  daily which is covered by his insurnace plan.

## 2022-10-25 ENCOUNTER — Ambulatory Visit (INDEPENDENT_AMBULATORY_CARE_PROVIDER_SITE_OTHER): Payer: BC Managed Care – PPO | Admitting: Internal Medicine

## 2022-10-25 ENCOUNTER — Encounter: Payer: Self-pay | Admitting: Internal Medicine

## 2022-10-25 VITALS — BP 130/64 | HR 104 | Temp 98.5°F | Resp 16 | Ht 66.0 in | Wt 156.5 lb

## 2022-10-25 DIAGNOSIS — R700 Elevated erythrocyte sedimentation rate: Secondary | ICD-10-CM

## 2022-10-25 DIAGNOSIS — D649 Anemia, unspecified: Secondary | ICD-10-CM

## 2022-10-25 DIAGNOSIS — E785 Hyperlipidemia, unspecified: Secondary | ICD-10-CM

## 2022-10-25 DIAGNOSIS — M109 Gout, unspecified: Secondary | ICD-10-CM | POA: Diagnosis not present

## 2022-10-25 DIAGNOSIS — E538 Deficiency of other specified B group vitamins: Secondary | ICD-10-CM | POA: Diagnosis not present

## 2022-10-25 MED ORDER — ROSUVASTATIN CALCIUM 20 MG PO TABS: 20.0000 mg | ORAL_TABLET | Freq: Every day | ORAL | 0 refills | Status: DC

## 2022-10-25 NOTE — Patient Instructions (Addendum)
Restart rosuvastatin 20 mg 1 tablet daily (cholesterol medication)  Continue allopurinol  Be sure you take vitamin D3: 2000 units every day  If you have gout: Take colchicine 0.6 mg 1 tablet twice daily until better and let me know.   GO TO THE LAB : Get the blood work     GO TO THE FRONT DESK, PLEASE SCHEDULE YOUR APPOINTMENTS Come back for   a checkup in 3 months

## 2022-10-25 NOTE — Progress Notes (Unsigned)
Subjective:    Patient ID: Richard Gates, male    DOB: February 18, 1961, 62 y.o.   MRN: [id number]  DOS:  10/25/2022 Type of visit - description: f/u  ER visit 07/31/2022: Acute wrist pain,  Uric acid 8.3, potassium 3.0, CRP 21 elevated, sed rate 85 elevated, CBC with elevated white count and a hemoglobin of 11.  Soft tissue swelling left wrist on x-rays.  Symptoms suspicious for gout, Rx indomethacin.  I ordered a uric acid, on 10/13/2022, it came back at 11.1.  Started allopurinol.  Anemia was noted, the patient denies any nausea vomiting or change in the color of the stools.  Occasional heartburn only.  Markers of inflammation elevated, gout is now better, not having fever chills or headache.  Respiratory symptoms controlled.   Review of Systems See above   Past Medical History:  Diagnosis Date   COPD (chronic obstructive pulmonary disease) (HCC)    Coronary artery calcification seen on CAT scan 08/2019   Coronary calcium score 103; short LM with<25% mixed calcific plaque (minimal); aortic atherosclerosis with normal size.  No dissection.  No aortic valve calcification   GERD (gastroesophageal reflux disease)    Hyperlipidemia    Hypertension     Past Surgical History:  Procedure Laterality Date   APPENDECTOMY     BIOPSY  10/02/2021   Procedure: BIOPSY;  Surgeon: Beverley Fiedler, MD;  Location: [facility] ENDOSCOPY;  Service: Gastroenterology;;   COLONOSCOPY  08/24/2016   COLONOSCOPY WITH PROPOFOL N/A 10/02/2021   Procedure: COLONOSCOPY WITH PROPOFOL;  Surgeon: Beverley Fiedler, MD;  Location: [facility] ENDOSCOPY;  Service: Gastroenterology;  Laterality: N/A;   ESOPHAGOGASTRODUODENOSCOPY (EGD) WITH PROPOFOL N/A 10/02/2021   Procedure: ESOPHAGOGASTRODUODENOSCOPY (EGD) WITH PROPOFOL;  Surgeon: Beverley Fiedler, MD;  Location: [facility] ENDOSCOPY;  Service: Gastroenterology;  Laterality: N/A;   POLYPECTOMY     TRANSTHORACIC ECHOCARDIOGRAM  04/2018   EF 65-70% (vigorous). No RWMA.  Gr 1 DD.  Normal valves.  (In  setting of COPD exacerbation)    Current Outpatient Medications  Medication Instructions   albuterol (VENTOLIN HFA) 108 (90 Base) MCG/ACT inhaler 2 puffs, Inhalation, Every 6 hours PRN   allopurinol (ZYLOPRIM) 300 mg, Oral, Daily   amLODipine (NORVASC) 5 mg, Oral, Daily   BREZTRI AEROSPHERE 160-9-4.8 MCG/ACT AERO Inhalation   colchicine 0.6 mg, Oral, 2 times daily   diphenoxylate-atropine (LOMOTIL) 2.5-0.025 MG tablet 1 tablet, Oral, 2 times daily   fluticasone-salmeterol (WIXELA INHUB) 500-50 MCG/ACT AEPB 1 puff, Inhalation, 2 times daily, Rinse mouth after each use.   indomethacin (INDOCIN) 50 mg, Oral, 3 times daily PRN   potassium chloride SA (KLOR-CON M) 20 MEQ tablet 20 mEq, Oral, 2 times daily       Objective:   Physical Exam BP 130/64   Pulse (!) 104   Temp 98.5 F (36.9 C) (Oral)   Resp 16   Ht 5\' 6"  (1.676 m)   Wt 156 lb 8 oz (71 kg)   SpO2 94%   BMI 25.26 kg/m  General:   Well developed, NAD, BMI noted. HEENT:  Normocephalic . Face symmetric, atraumatic Lungs:  CTA B Normal respiratory effort, no intercostal retractions, no accessory muscle use. Heart: RRR,  no murmur.  Upper extremities: Wrists without synovitis Skin: Not pale. Not jaundice Neurologic:  alert & oriented X3.  Speech normal, gait appropriate for age and unassisted Psych--  Cognition and judgment appear intact.  Cooperative with normal attention span and concentration.  Behavior appropriate. No anxious or depressed appearing.  Assessment     ASSESSMENT Hyperglycemia HTN GERD Hyperlipidemia, high TG Allergies , nasal  Pulmonary: ---COPD ( PFTs 2013 showed ratio of 58, FEV1 of 1.78- 52% without significant bronchodilator response, TLC was 77%   DLCO 78%) ---Smoker: quit 2020 ---Pulmonary nodule.  Incidentally found PET scan 12-2021.  CT needed 1 year CV: ---Aortic sclerosis per CT 05-2020 ---Coronary CTA, Ca+ Co score 103.0, L main artery, saw cards, probably still a low risk  situation.  Plan is CV RF control, see cards note 11/08/2019 Vit d def B12 deficiency Gout: DVT L leg dx  05-31-2021   PLAN: Gout: Presented to the ER 07/31/2018 w/ left wrist pain, suspicious for gout. Subsequently I check a uric acid, was 11.1, started allopurinol. Plan: Explained patient what is gout,  check AST ALT uric acid.  Continue allopurinol, colchicine as needed, stop indometacin Anemia, elevated CRP and sed rate: Per labs done at the ER in the context of acute gout episode.  Currently with no GI symptoms, had a C-scope and EGD 09/2021-next colonoscopy 2028, recheck CBC sed rate and CRP. Dyslipidemia: Triglycerides were 600 when checked March 2024, repeated labs fasting 10/13/2022: Triglycerides came back at 180. LDL 107,  CV RF of 13%.  Has been on atorvastatin and Crestor before.  Restart Crestor 20 mg. Vitamin deficiencies: Encouraged OTC vitamin D, no recent B12 shot, check levels. RTC in 3 months

## 2022-10-26 LAB — CBC WITH DIFFERENTIAL/PLATELET
Basophils Absolute: 0.1 10*3/uL (ref 0.0–0.1)
Basophils Relative: 1.2 % (ref 0.0–3.0)
Eosinophils Absolute: 0.1 10*3/uL (ref 0.0–0.7)
Eosinophils Relative: 1 % (ref 0.0–5.0)
HCT: 37.9 % — ABNORMAL LOW (ref 39.0–52.0)
Hemoglobin: 12.5 g/dL — ABNORMAL LOW (ref 13.0–17.0)
Lymphocytes Relative: 24.5 % (ref 12.0–46.0)
Lymphs Abs: 1.6 10*3/uL (ref 0.7–4.0)
MCHC: 33.1 g/dL (ref 30.0–36.0)
MCV: 93.8 fl (ref 78.0–100.0)
Monocytes Absolute: 0.6 10*3/uL (ref 0.1–1.0)
Monocytes Relative: 10 % (ref 3.0–12.0)
Neutro Abs: 4 10*3/uL (ref 1.4–7.7)
Neutrophils Relative %: 63.3 % (ref 43.0–77.0)
Platelets: 421 10*3/uL — ABNORMAL HIGH (ref 150.0–400.0)
RBC: 4.04 Mil/uL — ABNORMAL LOW (ref 4.22–5.81)
RDW: 16.3 % — ABNORMAL HIGH (ref 11.5–15.5)
WBC: 6.4 10*3/uL (ref 4.0–10.5)

## 2022-10-26 LAB — URIC ACID: Uric Acid, Serum: 7 mg/dL (ref 4.0–7.8)

## 2022-10-26 LAB — B12 AND FOLATE PANEL
Folate: 6.2 ng/mL (ref >5.9)
Vitamin B-12: 137 pg/mL — ABNORMAL LOW (ref 211–911)

## 2022-10-26 LAB — ALT: ALT: 14 U/L (ref 0–53)

## 2022-10-26 LAB — AST: AST: 23 U/L (ref 0–37)

## 2022-10-26 LAB — C-REACTIVE PROTEIN: CRP: <1 mg/dL (ref 0.5–20.0)

## 2022-10-26 LAB — SEDIMENTATION RATE: Sed Rate: 24 mm/hr — ABNORMAL HIGH (ref 0–20)

## 2022-10-26 NOTE — Assessment & Plan Note (Signed)
Gout: Presented to the ER 07/31/2018 w/ left wrist pain, suspicious for gout. Subsequently I check a uric acid, was 11.1, started allopurinol. Plan: Explained patient what is gout,  check AST ALT uric acid.  Continue allopurinol, colchicine as needed, stop indometacin Anemia, elevated CRP and sed rate: Per labs done at the ER in the context of acute gout episode.  Currently with no GI symptoms, had a C-scope and EGD 09/2021-next colonoscopy 2028, recheck CBC sed rate and CRP. Dyslipidemia: Triglycerides were 600 when checked March 2024, repeated labs fasting 10/13/2022: Triglycerides came back at 180. LDL 107,  CV RF of 13%.  Has been on atorvastatin and Crestor before.  Restart Crestor 20 mg. Vitamin deficiencies: Encouraged OTC vitamin D, no recent B12 shot, check levels. RTC in 3 months

## 2022-11-07 ENCOUNTER — Other Ambulatory Visit: Payer: Self-pay | Admitting: Internal Medicine

## 2022-11-21 ENCOUNTER — Telehealth: Payer: Self-pay

## 2022-11-21 ENCOUNTER — Other Ambulatory Visit: Payer: Self-pay | Admitting: Internal Medicine

## 2022-11-21 NOTE — Telephone Encounter (Signed)
PA cancelled   PA not needed

## 2022-11-21 NOTE — Telephone Encounter (Signed)
PA initiated via Covermymeds; KEY: [id number]. Awaiting determination.

## 2022-12-08 ENCOUNTER — Other Ambulatory Visit: Payer: BC Managed Care – PPO | Admitting: Pharmacist

## 2022-12-08 ENCOUNTER — Encounter: Payer: Self-pay | Admitting: Pharmacist

## 2022-12-08 DIAGNOSIS — J449 Chronic obstructive pulmonary disease, unspecified: Secondary | ICD-10-CM

## 2022-12-08 DIAGNOSIS — E790 Hyperuricemia without signs of inflammatory arthritis and tophaceous disease: Secondary | ICD-10-CM

## 2022-12-08 DIAGNOSIS — I10 Essential (primary) hypertension: Secondary | ICD-10-CM

## 2022-12-08 DIAGNOSIS — E781 Pure hyperglyceridemia: Secondary | ICD-10-CM

## 2022-12-08 MED ORDER — ALLOPURINOL 300 MG PO TABS: 300.0000 mg | ORAL_TABLET | Freq: Every day | ORAL | 1 refills | Status: DC

## 2022-12-08 NOTE — Progress Notes (Signed)
12/08/2022 Name: Richard Gates MRN: [id number] DOB: 1960/07/17  Chief Complaint  Patient presents with   Medication Management    Follow up   Hyperlipidemia    Richard Gates is a 62 y.o. year old male who presented for a telephone visit.   They were referred to the pharmacist by their PCP for assistance in managing hypertension, hyperlipidemia, and medication access.   Subjective:  Care Team: Primary Care Provider: Wanda Plump, MD ; Next Scheduled Visit: 10/25/2022 Hematologist - Dr Demetrius Charity. Ennever - 10/12/2022  Medication Access/Adherence  Current Pharmacy:  [id number] - [place], [place] - [address] AT [facility] [address] [place]  [address] Phone: [phone] Fax: [phone]   Patient reports affordability concerns with their medications: No  - patient has BCBS plan thru his employer. Pharmacy benefits are with [employer]. At last visit was able to find inhaler - Markus Daft that was on his formulary. Patient reports Markus Daft has been affordable and effective.  Patient reports access/transportation concerns to their pharmacy: No  Patient reports adherence concerns with their medications:  No     Hypertension:  Current medications: amlodipine 5mg  daily  - LR was 90 DS on 10/11/2022   BP Readings from Last 3 Encounters:  10/25/22 130/64  10/12/22 (!) 157/68  07/31/22 110/78   Not checking blood pressure at home currently  Patient denies hypotensive s/sx including no dizziness, lightheadedness.  Patient denies hypertensive symptoms including no headache, chest pain, shortness of breath  COPD:  Current maintenance inhaler:  Breztri - inhale 2 puffs twice a day Rescue medication: albuterol inhaler - use up to 4 times a day if needed.   Past Medications tried: Advair; AirDuo - prescribed but patient never started (cost with GoodRx card would have been $33.82); Was given sample of Trelegy but was not able to start  Trelegy due to cost of over $100 even after using manufacturer coupon.   Has not needed to use rescue inhaler more than 1 or 2 times in the last month.  Gout / elevated uric acid:  Currently therapy - allopurinol 300mg  daily and colchicine 0.6mg  up to twice a day if needed for gout flare.  Patient states he only has 10 tablets of allopurinol left - next PCP appt 02/07/2023  Seen in ED for bilateral wrist pain on 07/31/2022. Noted that uric acid was 8.3. Given oxycodone, prednisone 60mg , potassium 40 mEq and indomethacin 50mg  in the ED. Prescribed hydrocodone 5/325mg  #10, indomethacin 50mg  and potassium 20 mEq bid #6  10/13/2022 uric acid was up to 11.1; Allopurinol started.   10/25/2023 - OV with PCP, Dr Drue Novel. Checked labs for gout - uric acid improved from 11.1 to 7.0 Sed Rate still elevated but improved. CRP back to WNL.  Limiting food that can increase uric acid - yes Alcohol use - Yes, usually just on weekends.   Hypertriglyceridemia / hyperlipidemia  Currently therapy: rosuvastatin 20mg  daily - restarted 10/2022 Patient reports he is tolerating rosuvastatin without any side effects.    Objective:  Lab Results  Component Value Date   HGBA1C 5.4 07/25/2022    Lab Results  Component Value Date   CREATININE 0.92 10/12/2022   BUN 15 10/12/2022   NA 141 10/12/2022   K 3.4 (L) 10/12/2022   CL 102 10/12/2022   CO2 28 10/12/2022    Lab Results  Component Value Date   CHOL 239 (H) 10/13/2022  HDL 96.00 10/13/2022   LDLCALC 107 (H) 10/13/2022   LDLDIRECT 36.0 07/25/2022   TRIG 180.0 (H) 10/13/2022   CHOLHDL 2 10/13/2022    Medications Reviewed Today     Reviewed by Henrene Pastor, RPH-CPP (Pharmacist) on 12/08/22 at 1331  Med List Status: <None>   Medication Order Taking? Sig Documenting Provider Last Dose Status Informant  albuterol (VENTOLIN HFA) 108 (90 Base) MCG/ACT inhaler [id number] Yes Inhale 2 puffs into the lungs every 6 (six) hours as needed for wheezing or  shortness of breath. Wanda Plump, MD Taking Active   allopurinol (ZYLOPRIM) 300 MG tablet [id number] Yes Take 1 tablet (300 mg total) by mouth daily. Wanda Plump, MD Taking Active   amLODipine (NORVASC) 5 MG tablet [id number] Yes Take 1 tablet (5 mg total) by mouth daily. Wanda Plump, MD Taking Active   Budeson-Glycopyrrol-Formoterol (BREZTRI AEROSPHERE) 160-9-4.8 MCG/ACT Sandrea Matte [id number] Yes Inhale 2 puffs into the lungs 2 (two) times daily. Wanda Plump, MD Taking Active   cholecalciferol (VITAMIN D3) 25 MCG (1000 UNIT) tablet [id number] Yes Take 2,000 Units by mouth daily. [provider] Taking Active   colchicine 0.6 MG tablet [id number] Yes Take 1 tablet (0.6 mg total) by mouth 2 (two) times daily as needed (gout flare). Wanda Plump, MD Taking Active   cyanocobalamin (VITAMIN B12) 1000 MCG tablet [id number] Yes Take 1,000 mcg by mouth daily. [provider]  Active   rosuvastatin (CRESTOR) 20 MG tablet [id number] Yes Take 1 tablet (20 mg total) by mouth at bedtime. Wanda Plump, MD Taking Active               Assessment/Plan:  Medication Management:  Reviewed refill history - adherence has improved over the last 6 months. Updated med list - added over-the-counter B12 daily  Hypertension: Last office blood pressure reading at goal of < 140/90 - Continue amlodipine 5mg  daily - Continue to follow up low sodium  / heart health diet.   COPD: stable; has been able to continue maintenance inhaler since change to formulary inhaler and with coupon - Continue Breztri 2 inhalations twice a day - continue to use albuterol in needed for shortness of breath   Gout / elevated uric acid - improved since starting allopurinol Updated Rx for allopurinol 300mg  daily Continue to limit alcohol intake and foods that can increase uric acid  Hypertriglyceridemia  / hyperlipidemia  Continue rosuvastatin 20mg  daily  Due to recheck labs in October 2024.   Meds ordered this encounter   Medications   allopurinol (ZYLOPRIM) 300 MG tablet    Sig: Take 1 tablet (300 mg total) by mouth daily.    Dispense:  90 tablet    Refill:  1      Henrene Pastor, PharmD Clinical Pharmacist [facility]

## 2023-01-04 ENCOUNTER — Other Ambulatory Visit: Payer: Self-pay | Admitting: Internal Medicine

## 2023-01-12 ENCOUNTER — Inpatient Hospital Stay: Payer: BC Managed Care – PPO | Attending: Hematology & Oncology

## 2023-01-12 ENCOUNTER — Inpatient Hospital Stay (HOSPITAL_BASED_OUTPATIENT_CLINIC_OR_DEPARTMENT_OTHER): Payer: BC Managed Care – PPO | Admitting: Hematology & Oncology

## 2023-01-12 ENCOUNTER — Encounter: Payer: Self-pay | Admitting: Hematology & Oncology

## 2023-01-12 ENCOUNTER — Other Ambulatory Visit: Payer: Self-pay

## 2023-01-12 VITALS — BP 149/70 | HR 73 | Temp 98.2°F | Resp 18 | Ht 66.0 in | Wt 160.0 lb

## 2023-01-12 DIAGNOSIS — J45909 Unspecified asthma, uncomplicated: Secondary | ICD-10-CM | POA: Diagnosis not present

## 2023-01-12 DIAGNOSIS — E790 Hyperuricemia without signs of inflammatory arthritis and tophaceous disease: Secondary | ICD-10-CM

## 2023-01-12 DIAGNOSIS — R7989 Other specified abnormal findings of blood chemistry: Secondary | ICD-10-CM | POA: Diagnosis not present

## 2023-01-12 DIAGNOSIS — D530 Protein deficiency anemia: Secondary | ICD-10-CM

## 2023-01-12 DIAGNOSIS — Z862 Personal history of diseases of the blood and blood-forming organs and certain disorders involving the immune mechanism: Secondary | ICD-10-CM | POA: Insufficient documentation

## 2023-01-12 DIAGNOSIS — R978 Other abnormal tumor markers: Secondary | ICD-10-CM | POA: Diagnosis not present

## 2023-01-12 DIAGNOSIS — R748 Abnormal levels of other serum enzymes: Secondary | ICD-10-CM | POA: Diagnosis not present

## 2023-01-12 LAB — CBC WITH DIFFERENTIAL (CANCER CENTER ONLY)
Abs Immature Granulocytes: 0.08 10*3/uL — ABNORMAL HIGH (ref 0.00–0.07)
Basophils Absolute: 0 10*3/uL (ref 0.0–0.1)
Basophils Relative: 1 %
Eosinophils Absolute: 0.1 10*3/uL (ref 0.0–0.5)
Eosinophils Relative: 1 %
HCT: 35.4 % — ABNORMAL LOW (ref 39.0–52.0)
Hemoglobin: 11.6 g/dL — ABNORMAL LOW (ref 13.0–17.0)
Immature Granulocytes: 1 %
Lymphocytes Relative: 26 %
Lymphs Abs: 1.6 10*3/uL (ref 0.7–4.0)
MCH: 30.9 pg (ref 26.0–34.0)
MCHC: 32.8 g/dL (ref 30.0–36.0)
MCV: 94.1 fL (ref 80.0–100.0)
Monocytes Absolute: 0.7 10*3/uL (ref 0.1–1.0)
Monocytes Relative: 12 %
Neutro Abs: 3.4 10*3/uL (ref 1.7–7.7)
Neutrophils Relative %: 59 %
Platelet Count: 323 10*3/uL (ref 150–400)
RBC: 3.76 MIL/uL — ABNORMAL LOW (ref 4.22–5.81)
RDW: 13.4 % (ref 11.5–15.5)
WBC Count: 5.9 10*3/uL (ref 4.0–10.5)
nRBC: 0 % (ref 0.0–0.2)

## 2023-01-12 LAB — RETICULOCYTES
Immature Retic Fract: 13.5 % (ref 2.3–15.9)
RBC.: 3.79 MIL/uL — ABNORMAL LOW (ref 4.22–5.81)
Retic Count, Absolute: 103.1 10*3/uL (ref 19.0–186.0)
Retic Ct Pct: 2.7 % (ref 0.4–3.1)

## 2023-01-12 LAB — CMP (CANCER CENTER ONLY)
ALT: 29 U/L (ref 0–44)
AST: 34 U/L (ref 15–41)
Albumin: 4.4 g/dL (ref 3.5–5.0)
Alkaline Phosphatase: 89 U/L (ref 38–126)
Anion gap: 11 (ref 5–15)
BUN: 18 mg/dL (ref 8–23)
CO2: 26 mmol/L (ref 22–32)
Calcium: 9 mg/dL (ref 8.9–10.3)
Chloride: 105 mmol/L (ref 98–111)
Creatinine: 0.92 mg/dL (ref 0.61–1.24)
GFR, Estimated: >60 mL/min (ref >60)
Glucose, Bld: 88 mg/dL (ref 70–99)
Potassium: 3.8 mmol/L (ref 3.5–5.1)
Sodium: 142 mmol/L (ref 135–145)
Total Bilirubin: 0.7 mg/dL (ref 0.3–1.2)
Total Protein: 7.2 g/dL (ref 6.5–8.1)

## 2023-01-12 LAB — FERRITIN: Ferritin: 578 ng/mL — ABNORMAL HIGH (ref 24–336)

## 2023-01-12 NOTE — Progress Notes (Signed)
Hematology and Oncology Follow Up Visit  Richard Gates [id number] 09-03-1960 62 y.o. 01/12/2023   Principle Diagnosis:  Ferratin elevated in past, hemochromatosis DNA negative Elevated Chromogranin A  Current Therapy: Observation   Interim History:  Richard Gates is here today for follow-up.  He is doing okay.  He is working for [employer].  He is quite busy out there.  Thankfully, his last Chromogranin A level was down to 69.  I am very happy about this.  His last ferritin was down to 364.  Iron saturation was 20%.  He has had no problems with nausea or vomiting.  He has had no problems with fever.  He has had no issues with COVID.  He has had no change in bowel or bladder habits.  He has had no bleeding.  Is been no rashes.  Has had no leg swelling.  He does have underlying asthma.  Does have inhalers that he uses.  I know he is been using these during the Summer.  Overall, I would say that his performance status is probably ECOG 1.  Medications:  Allergies as of 01/12/2023   No Known Allergies      Medication List        Accurate as of January 12, 2023  4:06 PM. If you have any questions, ask your nurse or doctor.          albuterol 108 (90 Base) MCG/ACT inhaler Commonly known as: VENTOLIN HFA Inhale 2 puffs into the lungs every 6 (six) hours as needed for wheezing or shortness of breath.   allopurinol 300 MG tablet Commonly known as: ZYLOPRIM Take 1 tablet (300 mg total) by mouth daily.   amLODipine 5 MG tablet Commonly known as: NORVASC Take 1 tablet (5 mg total) by mouth daily.   Breztri Aerosphere 160-9-4.8 MCG/ACT Aero Generic drug: Budeson-Glycopyrrol-Formoterol Inhale 2 puffs into the lungs 2 (two) times daily.   cholecalciferol 25 MCG (1000 UNIT) tablet Commonly known as: VITAMIN D3 Take 2,000 Units by mouth daily.   colchicine 0.6 MG tablet Take 1 tablet (0.6 mg total) by mouth 2 (two) times daily as needed (gout flare).    cyanocobalamin 1000 MCG tablet Commonly known as: VITAMIN B12 Take 1,000 mcg by mouth daily.   rosuvastatin 20 MG tablet Commonly known as: Crestor Take 1 tablet (20 mg total) by mouth at bedtime.        Allergies: No Known Allergies  Past Medical History, Surgical history, Social history, and Family History were reviewed and updated.  Review of Systems: Review of Systems  Constitutional: Negative.   HENT: Negative.    Eyes: Negative.   Respiratory: Negative.    Cardiovascular: Negative.   Gastrointestinal:  Positive for diarrhea.  Genitourinary: Negative.   Musculoskeletal: Negative.   Skin: Negative.   Neurological: Negative.   Endo/Heme/Allergies: Negative.   Psychiatric/Behavioral: Negative.       Physical Exam:  height is 5\' 6"  (1.676 m) and weight is 160 lb (72.6 kg). His oral temperature is 98.2 F (36.8 C). His blood pressure is 149/70 (abnormal) and his pulse is 73. His respiration is 18 and oxygen saturation is 98%.   Wt Readings from Last 3 Encounters:  01/12/23 160 lb (72.6 kg)  10/25/22 156 lb 8 oz (71 kg)  10/12/22 154 lb 1.9 oz (69.9 kg)   Physical Exam Vitals reviewed.  HENT:     Head: Normocephalic and atraumatic.  Eyes:     Pupils: Pupils are equal, round, and reactive  to light.  Cardiovascular:     Rate and Rhythm: Normal rate and regular rhythm.     Heart sounds: Normal heart sounds.  Pulmonary:     Effort: Pulmonary effort is normal.     Breath sounds: Normal breath sounds.  Abdominal:     General: Bowel sounds are normal.     Palpations: Abdomen is soft.  Musculoskeletal:        General: No tenderness or deformity. Normal range of motion.     Cervical back: Normal range of motion.  Lymphadenopathy:     Cervical: No cervical adenopathy.  Skin:    General: Skin is warm and dry.     Findings: No erythema or rash.  Neurological:     Mental Status: He is alert and oriented to person, place, and time.  Psychiatric:        Behavior:  Behavior normal.        Thought Content: Thought content normal.        Judgment: Judgment normal.     Lab Results  Component Value Date   WBC 5.9 01/12/2023   HGB 11.6 (L) 01/12/2023   HCT 35.4 (L) 01/12/2023   MCV 94.1 01/12/2023   PLT 323 01/12/2023   Lab Results  Component Value Date   FERRITIN 364 (H) 06/15/2022   IRON 58 06/15/2022   TIBC 290 06/15/2022   UIBC 232 06/15/2022   IRONPCTSAT 20 06/15/2022   Lab Results  Component Value Date   RETICCTPCT 2.7 01/12/2023   RBC 3.76 (L) 01/12/2023   RBC 3.79 (L) 01/12/2023   No results found for: "KPAFRELGTCHN", "LAMBDASER", "KAPLAMBRATIO" Lab Results  Component Value Date   IGGSERUM 990 10/01/2021   No results found for: "TOTALPROTELP", "ALBUMINELP", "A1GS", "A2GS", "BETS", "BETA2SER", "GAMS", "MSPIKE", "SPEI"   Chemistry      Component Value Date/Time   NA 142 01/12/2023 1503   NA 140 01/19/2022 0000   K 3.8 01/12/2023 1503   CL 105 01/12/2023 1503   CO2 26 01/12/2023 1503   BUN 18 01/12/2023 1503   BUN 11 01/19/2022 0000   CREATININE 0.92 01/12/2023 1503   CREATININE 0.96 03/18/2022 1627   GLU 106 01/19/2022 0000      Component Value Date/Time   CALCIUM 9.0 01/12/2023 1503   ALKPHOS 89 01/12/2023 1503   AST 34 01/12/2023 1503   ALT 29 01/12/2023 1503   BILITOT 0.7 01/12/2023 1503       Impression and Plan: Richard Gates is a very pleasant 62 yo African-American gentleman.  We will have to see what his labs look like.  So far, we have seen no issues with respect to the ferritin or with the Chromogranin A.  I do not see that we have to do any scans on him right now.  He is totally asymptomatic.  We will plan to get him back in another 3 months.    Josph Macho, MD 9/12/20244:06 PM

## 2023-01-13 LAB — IRON AND IRON BINDING CAPACITY (CC-WL,HP ONLY)
Iron: 136 ug/dL (ref 45–182)
Saturation Ratios: 35 % (ref 17.9–39.5)
TIBC: 393 ug/dL (ref 250–450)
UIBC: 257 ug/dL (ref 117–376)

## 2023-01-14 LAB — CHROMOGRANIN A: Chromogranin A (ng/mL): 115.2 ng/mL — ABNORMAL HIGH (ref 0.0–101.8)

## 2023-02-03 ENCOUNTER — Other Ambulatory Visit: Payer: Self-pay | Admitting: Internal Medicine

## 2023-02-07 ENCOUNTER — Ambulatory Visit (INDEPENDENT_AMBULATORY_CARE_PROVIDER_SITE_OTHER): Payer: BC Managed Care – PPO | Admitting: Internal Medicine

## 2023-02-07 ENCOUNTER — Encounter: Payer: Self-pay | Admitting: Internal Medicine

## 2023-02-07 VITALS — BP 136/80 | HR 95 | Temp 98.2°F | Resp 16 | Ht 66.0 in | Wt 160.5 lb

## 2023-02-07 DIAGNOSIS — D649 Anemia, unspecified: Secondary | ICD-10-CM | POA: Diagnosis not present

## 2023-02-07 DIAGNOSIS — E785 Hyperlipidemia, unspecified: Secondary | ICD-10-CM

## 2023-02-07 DIAGNOSIS — R252 Cramp and spasm: Secondary | ICD-10-CM | POA: Diagnosis not present

## 2023-02-07 DIAGNOSIS — E559 Vitamin D deficiency, unspecified: Secondary | ICD-10-CM | POA: Diagnosis not present

## 2023-02-07 DIAGNOSIS — Z23 Encounter for immunization: Secondary | ICD-10-CM

## 2023-02-07 DIAGNOSIS — J449 Chronic obstructive pulmonary disease, unspecified: Secondary | ICD-10-CM

## 2023-02-07 MED ORDER — ALBUTEROL SULFATE HFA 108 (90 BASE) MCG/ACT IN AERS: 2.0000 | INHALATION_SPRAY | Freq: Four times a day (QID) | RESPIRATORY_TRACT | 5 refills | Status: DC | PRN

## 2023-02-07 NOTE — Assessment & Plan Note (Signed)
Gout: See LOV, uric acid decreased to 7.0 after he started allopurinol.  No further symptoms.    Current allopurinol dose 300 mg daily (because the 300 mg tablets are cheaper than the 200 mg tablet). Elevated CRP and sed rate see LOV, sed rate and CRP decreased significantly when rechecked. Anemia: Remains mild - stable Chromogranin A elevation, ferritin elevation: Hematology is still following the patient for elevated chromogranin A found during workup for chronic diarrhea which is now resolved. High cholesterol: Restarted Crestor June 2024, last LFTs okay, check FLP. Cramps: c/o cramps in the legs and arms on and off.  Started about the same time he re-started Crestor.  Check a total CK.  Start co-Q10, if not better he will let me know.  Hold Crestor?. B12 deficiency: New issue, Dx 10/2022.  Was recommended supplement, not sure if he is taking them.  Encouraged to do. Vitamin D: Encourage to take supplements. COPD: Symptoms under much better control since he started daily-maintenance meds on 07/2022   Hardly ever uses albuterol but will send a refill. Lung cancer screening: Had a CT of the chest for an unrelated reason on 05/25/2022: "4 mm left upper lobe lung nodule. Although likely benign, if the patient is high-risk, given the morphology and/or location of this nodule a non-contrast chest CT can be considered in 12 months" Plan: Chest CT 05-2023. Vaccine advised: Flu shot today.  Had Shingrix x 2.  Recommend a COVID booster and RSV. RTC 4 months

## 2023-02-07 NOTE — Progress Notes (Signed)
Subjective:    Patient ID: Richard Gates, male    DOB: [birth year], 62 y.o.   MRN: [id number]  DOS:  02/07/2023 Type of visit - description: f/u  Follow-up from previous visit. Started statins. History of gout, no further episodes. History of diarrhea: No further problems Emphysema: Better, cough only in the mornings. Did report muscle  cramps at different places, mostly at night.  Wt Readings from Last 3 Encounters:  02/07/23 160 lb 8 oz (72.8 kg)  01/12/23 160 lb (72.6 kg)  10/25/22 156 lb 8 oz (71 kg)     Review of Systems See above    Past Medical History:  Diagnosis Date   COPD (chronic obstructive pulmonary disease) (HCC)    Coronary artery calcification seen on CAT scan 08/2019   Coronary calcium score 103; short LM with<25% mixed calcific plaque (minimal); aortic atherosclerosis with normal size.  No dissection.  No aortic valve calcification   GERD (gastroesophageal reflux disease)    Hyperlipidemia    Hypertension     Past Surgical History:  Procedure Laterality Date   APPENDECTOMY     BIOPSY  10/02/2021   Procedure: BIOPSY;  Surgeon: Beverley Fiedler, MD;  Location[facility] ENDOSCOPY;  Service: Gastroenterology;;   COLONOSCOPY  08/24/2016   COLONOSCOPY WITH PROPOFOL N/A 10/02/2021   Procedure: COLONOSCOPY WITH PROPOFOL;  Surgeon: Beverley Fiedler, MD;  Location: [facility] ENDOSCOPY;  Service: Gastroenterology;  Laterality: N/A;   ESOPHAGOGASTRODUODENOSCOPY (EGD) WITH PROPOFOL N/A 10/02/2021   Procedure: ESOPHAGOGASTRODUODENOSCOPY (EGD) WITH PROPOFOL;  Surgeon: Beverley Fiedler, MD;  Location: [facility] ENDOSCOPY;  Service: Gastroenterology;  Laterality: N/A;   POLYPECTOMY     TRANSTHORACIC ECHOCARDIOGRAM  04/2018   EF 65-70% (vigorous). No RWMA.  Gr 1 DD.  Normal valves.  (In setting of COPD exacerbation)    Current Outpatient Medications  Medication Instructions   albuterol (VENTOLIN HFA) 108 (90 Base) MCG/ACT inhaler 2 puffs, Inhalation, Every 6 hours PRN   allopurinol (ZYLOPRIM)  300 mg, Oral, Daily   amLODipine (NORVASC) 5 mg, Oral, Daily   Budeson-Glycopyrrol-Formoterol (BREZTRI AEROSPHERE) 160-9-4.8 MCG/ACT AERO 2 puffs, Inhalation, 2 times daily   cholecalciferol (VITAMIN D3) 2,000 Units, Oral, Daily   colchicine 0.6 mg, Oral, 2 times daily PRN   CoQ-10 200 mg, Oral, Daily   cyanocobalamin (VITAMIN B12) 1,000 mcg, Oral, Daily   rosuvastatin (CRESTOR) 20 mg, Oral, Daily at bedtime       Objective:   Physical Exam BP 136/80   Pulse 95   Temp 98.2 F (36.8 C) (Oral)   Resp 16   Ht 5\' 6"  (1.676 m)   Wt 160 lb 8 oz (72.8 kg)   SpO2 94%   BMI 25.91 kg/m  General:   Well developed, NAD, BMI noted. HEENT:  Normocephalic . Face symmetric, atraumatic Lungs:  CTA B Normal respiratory effort, no intercostal retractions, no accessory muscle use. Heart: RRR,  no murmur.  Lower extremities: no pretibial edema bilaterally  Skin: Not pale. Not jaundice Neurologic:  alert & oriented X3.  Speech normal, gait appropriate for age and unassisted Psych--  Cognition and judgment appear intact.  Cooperative with normal attention span and concentration.  Behavior appropriate. No anxious or depressed appearing.      Assessment    ASSESSMENT Hyperglycemia HTN GERD Hyperlipidemia, high TG Allergies , nasal  Pulmonary: ---COPD ( PFTs 2013 showed ratio of 58, FEV1 of 1.78- 52% without significant bronchodilator response, TLC was 77%   DLCO 78%) ---Smoker: quit 2020 ---Pulmonary nodule.  Incidentally found PET scan 12-2021.  CT needed 1 year CV: ---Aortic sclerosis per CT 05-2020 ---Coronary CTA, Ca+ Co score 103.0, L main artery, saw cards, probably still a low risk situation.  Plan is CV RF control, see cards note 11/08/2019 Vit d def B12 deficiency Gout  DVT L leg dx  05-31-2021   PLAN: Gout: See LOV, uric acid decreased to 7.0 after he started allopurinol.  No further symptoms.    Current allopurinol dose 300 mg daily (because the 300 mg tablets are  cheaper than the 200 mg tablet). Elevated CRP and sed rate see LOV, sed rate and CRP decreased significantly when rechecked. Anemia: Remains mild - stable Chromogranin A elevation, ferritin elevation: Hematology is still following the patient for elevated chromogranin A found during workup for chronic diarrhea which is now resolved. High cholesterol: Restarted Crestor June 2024, last LFTs okay, check FLP. Cramps: c/o cramps in the legs and arms on and off.  Started about the same time he re-started Crestor.  Check a total CK.  Start co-Q10, if not better he will let me know.  Hold Crestor?. B12 deficiency: New issue, Dx 10/2022.  Was recommended supplement, not sure if he is taking them.  Encouraged to do. Vitamin D: Encourage to take supplements. COPD: Symptoms under much better control since he started daily-maintenance meds on 07/2022   Hardly ever uses albuterol but will send a refill. Lung cancer screening: Had a CT of the chest for an unrelated reason on 05/25/2022: "4 mm left upper lobe lung nodule. Although likely benign, if the patient is high-risk, given the morphology and/or location of this nodule a non-contrast chest CT can be considered in 12 months" Plan: Chest CT 05-2023. Vaccine advised: Flu shot today.  Had Shingrix x 2.  Recommend a COVID booster and RSV. RTC 4 months

## 2023-02-07 NOTE — Patient Instructions (Addendum)
Vaccines I recommend: Covid booster- new this fall RSV vaccine  Be sure you take B12 supplements and vitamin D supplements every day.  For cramps: Start taking over-the-counter co-Q10: 200 mg every day. Please let me know if your cramps are not improving in the next few weeks.      GO TO THE LAB : Get the blood work     Next visit with me in 4 months, please schedule an appointment

## 2023-02-08 LAB — LIPID PANEL
Cholesterol: 209 mg/dL — ABNORMAL HIGH (ref 0–200)
HDL: 134.4 mg/dL (ref >39.00)
LDL Cholesterol: 40 mg/dL (ref 0–99)
NonHDL: 75.09
Total CHOL/HDL Ratio: 2
Triglycerides: 177 mg/dL — ABNORMAL HIGH (ref 0.0–149.0)
VLDL: 35.4 mg/dL (ref 0.0–40.0)

## 2023-02-08 LAB — CK: Total CK: 270 U/L — ABNORMAL HIGH (ref 7–232)

## 2023-03-10 ENCOUNTER — Encounter: Payer: BC Managed Care – PPO | Admitting: Pharmacist

## 2023-03-10 ENCOUNTER — Telehealth: Payer: Self-pay | Admitting: Pharmacist

## 2023-03-10 NOTE — Telephone Encounter (Signed)
Attempt was made to contact patient by phone today for follow up by Clinical Pharmacist regarding medication cost and adherence Unable to reach patient. LM on VM with my contact number [phone].

## 2023-03-10 NOTE — Progress Notes (Unsigned)
03/10/2023 Name: Richard Gates MRN: [id number] DOB: Jul 07, 1960  No chief complaint on file.   Richard Gates is a 62 y.o. year old male who presented for a telephone visit.   They were referred to the pharmacist by their PCP for assistance in managing hypertension, hyperlipidemia, and medication access.   Subjective:  Care Team: Primary Care Provider: Wanda Plump, MD ; Next Scheduled Visit: 10/25/2022 Hematologist - Dr Demetrius Charity. Ennever - 10/12/2022  Medication Access/Adherence  Current Pharmacy:  [id number] - [place], [place] - [address] AT [facility] [address] [place] [address] Phone: [phone] Fax: [phone]   Patient reports affordability concerns with their medications: No  - patient has BCBS plan thru his employer. Pharmacy benefits are with [employer]. At last visit was able to find inhaler - Markus Daft that was on his formulary. Patient reports Markus Daft has been affordable and effective.  Patient reports access/transportation concerns to their pharmacy: No  Patient reports adherence concerns with their medications:  No     Hypertension:  Current medications: amlodipine 5mg  daily  - LR was 90 DS on 10/11/2022   BP Readings from Last 3 Encounters:  02/07/23 136/80  01/12/23 (!) 149/70  10/25/22 130/64   Not checking blood pressure at home currently  Patient denies hypotensive s/sx including no dizziness, lightheadedness.  Patient denies hypertensive symptoms including no headache, chest pain, shortness of breath  COPD:  Current maintenance inhaler:  Breztri - inhale 2 puffs twice a day Rescue medication: albuterol inhaler - use up to 4 times a day if needed.   Past Medications tried: Advair; AirDuo - prescribed but patient never started (cost with GoodRx card would have been $33.82); Was given sample of Trelegy but was not able to start Trelegy due to cost of over $100 even after using manufacturer  coupon.   Has not needed to use rescue inhaler more than 1 or 2 times in the last month.  Gout / elevated uric acid:  Currently therapy - allopurinol 300mg  daily and colchicine 0.6mg  up to twice a day if needed for gout flare.  Patient states he only has 10 tablets of allopurinol left - next PCP appt 02/07/2023  Seen in ED for bilateral wrist pain on 07/31/2022. Noted that uric acid was 8.3. Given oxycodone, prednisone 60mg , potassium 40 mEq and indomethacin 50mg  in the ED. Prescribed hydrocodone 5/325mg  #10, indomethacin 50mg  and potassium 20 mEq bid #6  10/13/2022 uric acid was up to 11.1; Allopurinol started.   10/25/2023 - OV with PCP, Dr Drue Novel. Checked labs for gout - uric acid improved from 11.1 to 7.0 Sed Rate still elevated but improved. CRP back to WNL.  Limiting food that can increase uric acid - yes Alcohol use - Yes, usually just on weekends.   Hypertriglyceridemia / hyperlipidemia  Currently therapy: rosuvastatin 20mg  daily - restarted 10/2022 Patient reports he is tolerating rosuvastatin without any side effects.    Objective:  Lab Results  Component Value Date   HGBA1C 5.4 07/25/2022    Lab Results  Component Value Date   CREATININE 0.92 01/12/2023   BUN 18 01/12/2023   NA 142 01/12/2023   K 3.8 01/12/2023   CL 105 01/12/2023   CO2 26 01/12/2023    Lab Results  Component Value Date   CHOL 209 (H) 02/07/2023   HDL 134.40 02/07/2023   LDLCALC 40 02/07/2023   LDLDIRECT 36.0 07/25/2022  TRIG 177.0 (H) 02/07/2023   CHOLHDL 2 02/07/2023    Medications Reviewed Today   Medications were not reviewed in this encounter       Assessment/Plan:  Medication Management:  Reviewed refill history - adherence has improved over the last 6 months. Updated med list - added over-the-counter B12 daily  Hypertension: Last office blood pressure reading at goal of < 140/90 - Continue amlodipine 5mg  daily - Continue to follow up low sodium  / heart health  diet.   COPD: stable; has been able to continue maintenance inhaler since change to formulary inhaler and with coupon - Continue Breztri 2 inhalations twice a day - continue to use albuterol in needed for shortness of breath   Gout / elevated uric acid - improved since starting allopurinol Updated Rx for allopurinol 300mg  daily Continue to limit alcohol intake and foods that can increase uric acid  Hypertriglyceridemia  / hyperlipidemia  Continue rosuvastatin 20mg  daily  Due to recheck labs in October 2024.   No orders of the defined types were placed in this encounter.     Henrene Pastor, PharmD Clinical Pharmacist [facility]

## 2023-04-05 ENCOUNTER — Other Ambulatory Visit (HOSPITAL_BASED_OUTPATIENT_CLINIC_OR_DEPARTMENT_OTHER): Payer: Self-pay

## 2023-04-05 ENCOUNTER — Telehealth: Payer: Self-pay | Admitting: Internal Medicine

## 2023-04-05 ENCOUNTER — Other Ambulatory Visit: Payer: Self-pay

## 2023-04-05 MED ORDER — ALBUTEROL SULFATE HFA 108 (90 BASE) MCG/ACT IN AERS
2.0000 | INHALATION_SPRAY | Freq: Four times a day (QID) | RESPIRATORY_TRACT | 5 refills | Status: DC | PRN
  Filled 2023-04-05: qty 6.7, 25d supply, fill #0

## 2023-04-05 NOTE — Telephone Encounter (Signed)
Spoke w/ Pt- informed Rx has been sent downstairs, however I do not have a discount card for albuterol. Informed he could check w/ GoodRx. Pt verbalized understanding.

## 2023-04-05 NOTE — Telephone Encounter (Signed)
Pt called and wanted to ask if PCP could send a prescription for Albuterol with a discount card. He explained that he is getting a new insurance and it will activate in January. He prefers for it to be sent to  [facility]. Please advise.

## 2023-05-07 ENCOUNTER — Other Ambulatory Visit: Payer: Self-pay | Admitting: Internal Medicine

## 2023-05-23 ENCOUNTER — Ambulatory Visit (INDEPENDENT_AMBULATORY_CARE_PROVIDER_SITE_OTHER): Payer: No Typology Code available for payment source | Admitting: Internal Medicine

## 2023-05-23 ENCOUNTER — Encounter: Payer: Self-pay | Admitting: Internal Medicine

## 2023-05-23 ENCOUNTER — Telehealth: Payer: Self-pay | Admitting: Internal Medicine

## 2023-05-23 VITALS — BP 146/80 | HR 108 | Temp 98.1°F | Resp 16 | Ht 66.0 in | Wt 161.5 lb

## 2023-05-23 DIAGNOSIS — J449 Chronic obstructive pulmonary disease, unspecified: Secondary | ICD-10-CM

## 2023-05-23 DIAGNOSIS — I10 Essential (primary) hypertension: Secondary | ICD-10-CM | POA: Diagnosis not present

## 2023-05-23 DIAGNOSIS — R000 Tachycardia, unspecified: Secondary | ICD-10-CM

## 2023-05-23 MED ORDER — BISOPROLOL-HYDROCHLOROTHIAZIDE 2.5-6.25 MG PO TABS: 2.0000 | ORAL_TABLET | Freq: Every day | ORAL | 6 refills | Status: DC

## 2023-05-23 MED ORDER — BISOPROLOL-HYDROCHLOROTHIAZIDE 2.5-6.25 MG PO TABS: 1.0000 | ORAL_TABLET | Freq: Every day | ORAL | 6 refills | Status: DC

## 2023-05-23 MED ORDER — BISOPROLOL FUMARATE 5 MG PO TABS: 5.0000 mg | ORAL_TABLET | Freq: Every day | ORAL | 6 refills | Status: AC

## 2023-05-23 MED ORDER — ALBUTEROL SULFATE HFA 108 (90 BASE) MCG/ACT IN AERS: 2.0000 | INHALATION_SPRAY | Freq: Four times a day (QID) | RESPIRATORY_TRACT | 5 refills | Status: DC | PRN

## 2023-05-23 NOTE — Telephone Encounter (Signed)
Copied from CRM [id number]. Topic: Appointments - Appointment Info/Confirmation >> May 23, 2023  2:01 PM Isabell A wrote: Patient states he has an appointment [facility] on [facility] 05/[facility]/23, he tried to cancel due to starting a new job - the office told him Dr.Paz's nurse needs to cancel that appointment for him.    [address] [place], [place] [address]  Phone [phone]  Please advise?

## 2023-05-23 NOTE — Telephone Encounter (Signed)
Spoke w/ Pt- informed he will have to call pulmonary if he is not interested in doing CT scan.

## 2023-05-23 NOTE — Patient Instructions (Addendum)
Add a medication called bisoprolol 5 mg 1 tablet daily.   Check the  blood pressure regularly Blood pressure goal:  between 110/65 and  135/85. If it is consistently higher or lower, let me know      Next visit with me already scheduled for next month

## 2023-05-23 NOTE — Progress Notes (Unsigned)
Subjective:    Patient ID: Richard Gates, male    DOB: 1960/06/05, 63 y.o.   MRN: [id number]  DOS:  05/23/2023 Type of visit - description: Acute  2 weeks ago, he went to participate on a trial, was told his BP was elevated, does not remember the reading.  Has not checked his BP since then. Reports good medicine compliance. Denies chest pain or difficulty breathing.  No edema.  No headache.    BP Readings from Last 3 Encounters:  05/23/23 (!) 146/80  02/07/23 136/80  01/12/23 (!) 149/70     Review of Systems See above   Past Medical History:  Diagnosis Date   COPD (chronic obstructive pulmonary disease) (HCC)    Coronary artery calcification seen on CAT scan 08/2019   Coronary calcium score 103; short LM with<25% mixed calcific plaque (minimal); aortic atherosclerosis with normal size.  No dissection.  No aortic valve calcification   GERD (gastroesophageal reflux disease)    Hyperlipidemia    Hypertension     Past Surgical History:  Procedure Laterality Date   APPENDECTOMY     BIOPSY  10/02/2021   Procedure: BIOPSY;  Surgeon: Beverley Fiedler, MD;  Location: [facility] ENDOSCOPY;  Service: Gastroenterology;;   COLONOSCOPY  08/24/2016   COLONOSCOPY WITH PROPOFOL N/A 10/02/2021   Procedure: COLONOSCOPY WITH PROPOFOL;  Surgeon: Beverley Fiedler, MD;  Location: [facility] ENDOSCOPY;  Service: Gastroenterology;  Laterality: N/A;   ESOPHAGOGASTRODUODENOSCOPY (EGD) WITH PROPOFOL N/A 10/02/2021   Procedure: ESOPHAGOGASTRODUODENOSCOPY (EGD) WITH PROPOFOL;  Surgeon: Beverley Fiedler, MD;  Location: [facility] ENDOSCOPY;  Service: Gastroenterology;  Laterality: N/A;   POLYPECTOMY     TRANSTHORACIC ECHOCARDIOGRAM  04/2018   EF 65-70% (vigorous). No RWMA.  Gr 1 DD.  Normal valves.  (In setting of COPD exacerbation)    Current Outpatient Medications  Medication Instructions   albuterol (VENTOLIN HFA) 108 (90 Base) MCG/ACT inhaler 2 puffs, Inhalation, Every 6 hours PRN   allopurinol (ZYLOPRIM) 300 mg, Oral, Daily    amLODipine (NORVASC) 5 mg, Oral, Daily   Budeson-Glycopyrrol-Formoterol (BREZTRI AEROSPHERE) 160-9-4.8 MCG/ACT AERO 2 puffs, Inhalation, 2 times daily   cholecalciferol (VITAMIN D3) 2,000 Units, Daily   colchicine 0.6 mg, Oral, 2 times daily PRN   CoQ-10 200 mg, Daily   cyanocobalamin (VITAMIN B12) 1,000 mcg, Daily   rosuvastatin (CRESTOR) 20 mg, Oral, Daily at bedtime       Objective:   Physical Exam BP (!) 146/80   Pulse (!) 108   Temp 98.1 F (36.7 C) (Oral)   Resp 16   Ht 5\' 6"  (1.676 m)   Wt 161 lb 8 oz (73.3 kg)   SpO2 98%   BMI 26.07 kg/m  General:   Well developed, NAD, BMI noted. HEENT:  Normocephalic . Face symmetric, atraumatic Lungs:  CTA B Normal respiratory effort, no intercostal retractions, no accessory muscle use. Heart: Slightly tachycardic no murmur.  Lower extremities: no pretibial edema bilaterally  Skin: Not pale. Not jaundice Neurologic:  alert & oriented X3.  Speech normal, gait appropriate for age and unassisted Psych--  Cognition and judgment appear intact.  Cooperative with normal attention span and concentration.  Behavior appropriate. No anxious or depressed appearing.      Assessment     ASSESSMENT Hyperglycemia HTN GERD Hyperlipidemia, high TG Allergies , nasal  Pulmonary: ---COPD ( PFTs 2013 showed ratio of 58, FEV1 of 1.78- 52% without significant bronchodilator response, TLC was 77%   DLCO 78%) ---Smoker: quit 2020 ---Pulmonary nodule.  Incidentally found PET scan 12-2021.  CT needed 1 year CV: ---Aortic sclerosis per CT 05-2020 ---Coronary CTA, Ca+ Co score 103.0, L main artery, saw cards, probably still a low risk situation.  Plan is CV RF control, see cards note 11/08/2019 Vit d def B12 deficiency Gout  DVT L leg dx  05-31-2021   PLAN: HTN: BP reportedly is slightly elevated 2 weeks ago, last 3 BPs available reviewed, has been as high as 149/70. Plan: Currently on amlodipine, bisoprolol 5 mg daily..  Monitor  BPs Slightly tachycardic. In previous visit heart rate has been in the 90s, low 100s.  No anemia, last TSH normal.  EKG today: Sinus tachycardia.  Observation. COPD: Well-controlled, RF albuterol. B12 deficiency: Patiently diagnosed, on supplements RTC as scheduled for next month. III      =============10/8 Gout: See LOV, uric acid decreased to 7.0 after he started allopurinol.  No further symptoms.    Current allopurinol dose 300 mg daily (because the 300 mg tablets are cheaper than the 200 mg tablet). Elevated CRP and sed rate see LOV, sed rate and CRP decreased significantly when rechecked. Anemia: Remains mild - stable Chromogranin A elevation, ferritin elevation: Hematology is still following the patient for elevated chromogranin A found during workup for chronic diarrhea which is now resolved. High cholesterol: Restarted Crestor June 2024, last LFTs okay, check FLP. Cramps: c/o cramps in the legs and arms on and off.  Started about the same time he re-started Crestor.  Check a total CK.  Start co-Q10, if not better he will let me know.  Hold Crestor?. B12 deficiency: New issue, Dx 10/2022.  Was recommended supplement, not sure if he is taking them.  Encouraged to do. Vitamin D: Encourage to take supplements. COPD: Symptoms under much better control since he started daily-maintenance meds on 07/2022   Hardly ever uses albuterol but will send a refill. Lung cancer screening: Had a CT of the chest for an unrelated reason on 05/25/2022: "4 mm left upper lobe lung nodule. Although likely benign, if the patient is high-risk, given the morphology and/or location of this nodule a non-contrast chest CT can be considered in 12 months" Plan: Chest CT 05-2023. Vaccine advised: Flu shot today.  Had Shingrix x 2.  Recommend a COVID booster and RSV. RTC 4 months

## 2023-05-24 NOTE — Assessment & Plan Note (Signed)
HTN: BP reportedly is slightly elevated 2 weeks ago, last 3 BPs available reviewed, has been as high as 149/70. Currently on amlodipine. Plan: add  bisoprolol 5 mg daily..  Monitor BPs Tachycardia In previous visit heart rate has been in the 90s, low 100s.  No anemia, last TSH normal.  EKG today: Sinus tachycardia.   Plan --Observation. COPD: Well-controlled, RF albuterol. B12 deficiency: recently dx, on supplements RTC as scheduled for next month.

## 2023-05-29 ENCOUNTER — Other Ambulatory Visit: Payer: Self-pay

## 2023-06-08 ENCOUNTER — Other Ambulatory Visit: Payer: Self-pay | Admitting: Internal Medicine

## 2023-06-08 NOTE — Telephone Encounter (Signed)
 This RN contacted patient regarding albuterol  108 (90 base) MCG/ACT inhaler. Patient states that pharmacy does not have any refills left on prescription. Patient is out of inhaler. Patient notified note will be routed to office for their review. Patient verbalized understanding.   Copied from CRM [id number]. Topic: Clinical - Medication Refill >> Jun 08, 2023  4:10 PM Tonda B wrote: Most Recent Primary Care Visit:  Provider: AMON SCHANZ E  Department: [facility]  Visit Type: OFFICE VISIT  Date: 05/23/2023  Medication: albuterol  (VENTOLIN  HFA) 108 (90 Base) MCG/ACT inhaler   Has the patient contacted their pharmacy? Yes (Agent: If no, request that the patient contact the pharmacy for the refill. If patient does not wish to contact the pharmacy document the reason why and proceed with request.) (Agent: If yes, when and what did the pharmacy advise?)  Is this the correct pharmacy for this prescription? Yes If no, delete pharmacy and type the correct one.  This is the patient's preferred pharmacy:  [id number] - [place], [place] - [address] AT [facility] [address] [place] [address] Phone: [phone] Fax: [phone]   Has the prescription been filled recently? Yes  Is the patient out of the medication? Yes  Has the patient been seen for an appointment in the last year OR does the patient have an upcoming appointment? Yes  Can we respond through MyChart? No  Agent: Please be advised that Rx refills may take up to 3 business days. We ask that you follow-up with your pharmacy.

## 2023-06-09 MED ORDER — ALBUTEROL SULFATE HFA 108 (90 BASE) MCG/ACT IN AERS: 2.0000 | INHALATION_SPRAY | Freq: Four times a day (QID) | RESPIRATORY_TRACT | 5 refills | Status: DC | PRN

## 2023-06-12 ENCOUNTER — Ambulatory Visit: Payer: BC Managed Care – PPO | Admitting: Internal Medicine

## 2023-06-20 ENCOUNTER — Ambulatory Visit: Payer: BC Managed Care – PPO | Admitting: Internal Medicine

## 2023-07-04 ENCOUNTER — Encounter: Payer: Self-pay | Admitting: Internal Medicine

## 2023-07-04 ENCOUNTER — Ambulatory Visit (INDEPENDENT_AMBULATORY_CARE_PROVIDER_SITE_OTHER): Payer: BC Managed Care – PPO | Admitting: Internal Medicine

## 2023-07-04 VITALS — BP 136/74 | HR 74 | Temp 98.2°F | Resp 18 | Ht 67.25 in | Wt 160.0 lb

## 2023-07-04 DIAGNOSIS — I10 Essential (primary) hypertension: Secondary | ICD-10-CM | POA: Diagnosis not present

## 2023-07-04 NOTE — Patient Instructions (Signed)
  Check the  blood pressure regularly Blood pressure goal:  between 110/65 and  135/85. If it is consistently higher or lower, let me know     Please go to the front desk and schedule the following: Physical exam in 2 to 3 months

## 2023-07-04 NOTE — Progress Notes (Unsigned)
 Subjective:    Patient ID: Richard Gates, male    DOB: Oct 07, 1960, 63 y.o.   MRN: [id number]  DOS:  07/04/2023 Type of visit - description: Follow-up  Routine follow-up. Yesterday went for a new employment exam, BP was elevated.  Also, the doctor told him that he has a lump at the anterior chest. He  feels well. Denies chest pain or headache. History of COPD, mild short of breath sometimes.   Review of Systems See above   Past Medical History:  Diagnosis Date   COPD (chronic obstructive pulmonary disease) (HCC)    Coronary artery calcification seen on CAT scan 08/2019   Coronary calcium score 103; short LM with<25% mixed calcific plaque (minimal); aortic atherosclerosis with normal size.  No dissection.  No aortic valve calcification   GERD (gastroesophageal reflux disease)    Hyperlipidemia    Hypertension     Past Surgical History:  Procedure Laterality Date   APPENDECTOMY     BIOPSY  10/02/2021   Procedure: BIOPSY;  Surgeon: Beverley Fiedler, MD;  Location: [facility] ENDOSCOPY;  Service: Gastroenterology;;   COLONOSCOPY  08/24/2016   COLONOSCOPY WITH PROPOFOL N/A 10/02/2021   Procedure: COLONOSCOPY WITH PROPOFOL;  Surgeon: Beverley Fiedler, MD;  Location: [facility] ENDOSCOPY;  Service: Gastroenterology;  Laterality: N/A;   ESOPHAGOGASTRODUODENOSCOPY (EGD) WITH PROPOFOL N/A 10/02/2021   Procedure: ESOPHAGOGASTRODUODENOSCOPY (EGD) WITH PROPOFOL;  Surgeon: Beverley Fiedler, MD;  Location: [facility] ENDOSCOPY;  Service: Gastroenterology;  Laterality: N/A;   POLYPECTOMY     TRANSTHORACIC ECHOCARDIOGRAM  04/2018   EF 65-70% (vigorous). No RWMA.  Gr 1 DD.  Normal valves.  (In setting of COPD exacerbation)    Current Outpatient Medications  Medication Instructions   albuterol (VENTOLIN HFA) 108 (90 Base) MCG/ACT inhaler 2 puffs, Inhalation, Every 6 hours PRN   allopurinol (ZYLOPRIM) 300 mg, Oral, Daily   amLODipine (NORVASC) 5 mg, Oral, Daily   bisoprolol (ZEBETA) 5 mg, Oral, Daily    Budeson-Glycopyrrol-Formoterol (BREZTRI AEROSPHERE) 160-9-4.8 MCG/ACT AERO 2 puffs, Inhalation, 2 times daily   cholecalciferol (VITAMIN D3) 2,000 Units, Daily   colchicine 0.6 mg, Oral, 2 times daily PRN   CoQ-10 200 mg, Daily   cyanocobalamin (VITAMIN B12) 1,000 mcg, Daily   rosuvastatin (CRESTOR) 20 mg, Oral, Daily at bedtime       Objective:   Physical Exam BP (!) 142/86   Pulse 74   Temp 98.2 F (36.8 C) (Oral)   Resp 18   Ht 5' 7.25" (1.708 m)   Wt 160 lb (72.6 kg)   SpO2 94%   BMI 24.87 kg/m  General:   Well developed, NAD, BMI noted. HEENT:  Normocephalic . Face symmetric, atraumatic Chest wall: No lump anteriorly, he does have prominent but normal xiphoid process. Lungs:  Slightly increased expiratory time otherwise clear Normal respiratory effort, no intercostal retractions, no accessory muscle use. Heart: RRR,  no murmur.  Lower extremities: no pretibial edema bilaterally  Skin: Not pale. Not jaundice Neurologic:  alert & oriented X3.  Speech normal, gait appropriate for age and unassisted Psych--  Cognition and judgment appear intact.  Cooperative with normal attention span and concentration.  Behavior appropriate. No anxious or depressed appearing.      Assessment   ASSESSMENT Hyperglycemia HTN GERD Hyperlipidemia, high TG Allergies , nasal  Pulmonary: ---COPD ( PFTs 2013 showed ratio of 58, FEV1 of 1.78- 52% without significant bronchodilator response, TLC was 77%   DLCO 78%) ---Smoker: quit 2020 ---Pulmonary nodule.  Incidentally found PET  scan 12-2021.  CT needed 1 year CV: ---Aortic sclerosis per CT 05-2020 ---Coronary CTA, Ca+ Co score 103.0, L main artery, saw cards, probably still a low risk situation.  Plan is CV RF control, see cards note 11/08/2019 Vit d def B12 deficiency Gout  DVT L leg dx  05-31-2021   PLAN: HTN: BP was elevated yesterday when he went for a new job exam.  (164/94) Good compliance with amlodipine but due to  insurance issues was only able to start bisoprolol yesterday. BP today looks good, continue present care. Notes I need for his new employer, okay to proceed with FIT testing. COPD: Slightly increased expiratory time on exam, will be to get Markus Daft today, has some insurance issues. Chest wall lump: The doctor yesterday felt something in the anterior chest, on exam they wanted and found this prominent Siefert process.  Observation. RTC CPX 2 to 3 months.  Monitor BPs.  HTN: BP reportedly is slightly elevated 2 weeks ago, last 3 BPs available reviewed, has been as high as 149/70. Currently on amlodipine. Plan: add  bisoprolol 5 mg daily..  Monitor BPs Tachycardia In previous visit heart rate has been in the 90s, low 100s.  No anemia, last TSH normal.  EKG today: Sinus tachycardia.   Plan --Observation. COPD: Well-controlled, RF albuterol. B12 deficiency: recently dx, on supplements RTC as scheduled for next month.

## 2023-07-05 IMAGING — US US RENAL
1 series · 14 of 25 positions shown · non-contrast
Comparison: None.

CLINICAL DATA: Decreased kidney function.

EXAM:
RENAL / URINARY TRACT ULTRASOUND COMPLETE

[Series 1: us renal · 14 of 42 slices shown]
[im 1/42]
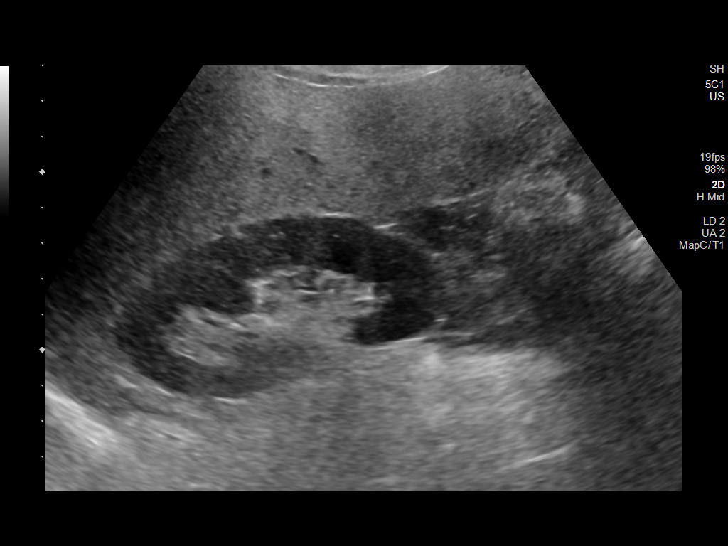
[im 4/42]
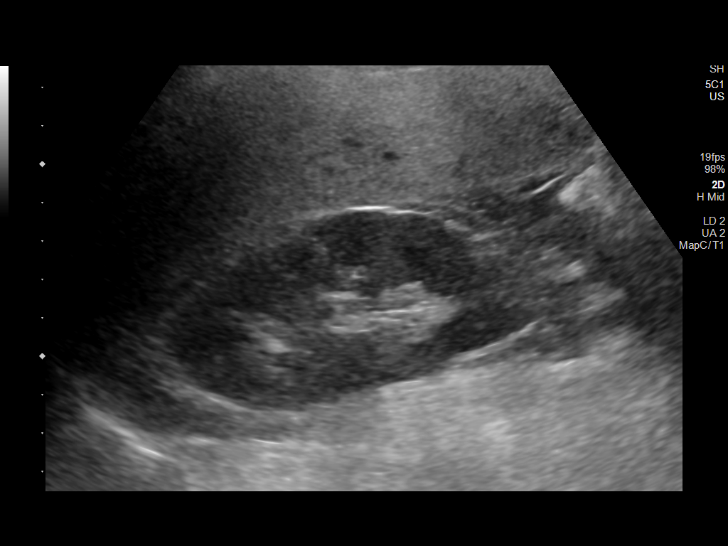
[im 7/42]
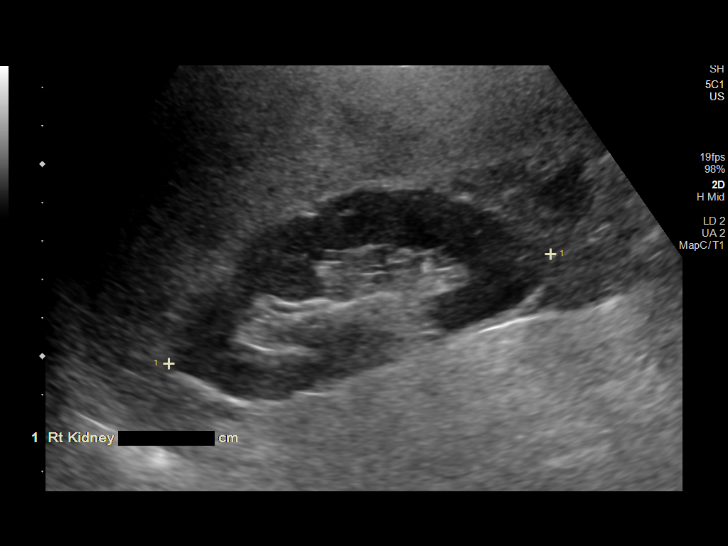
[im 11/42]
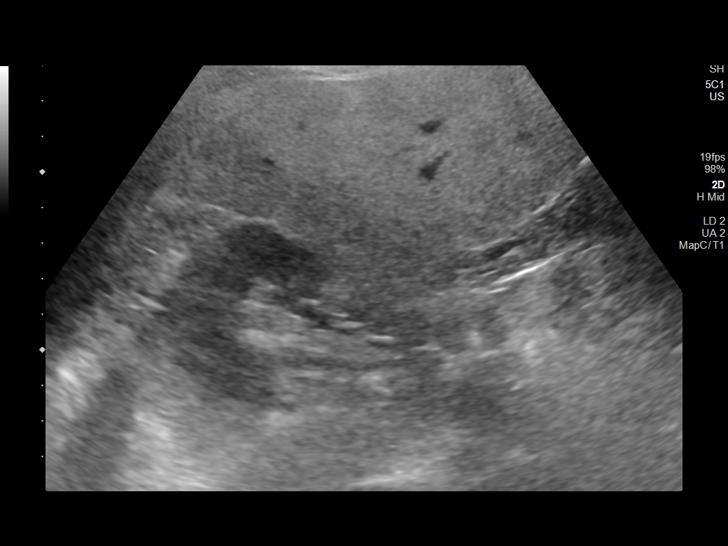
[im 14/42]
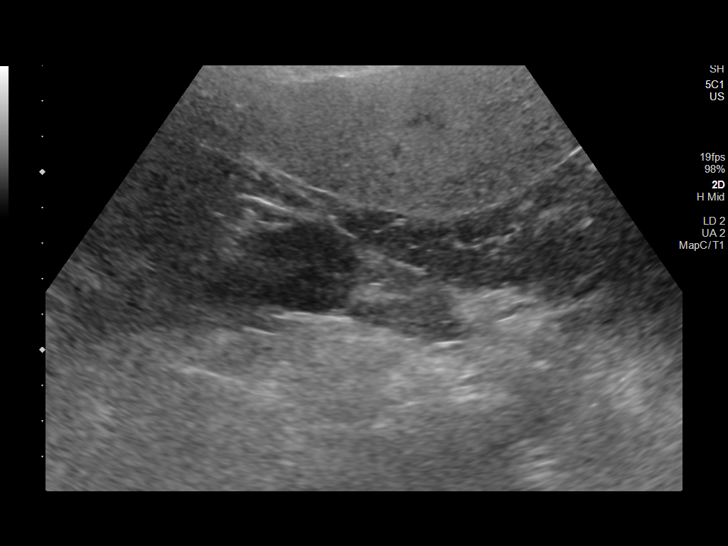
[im 16/42]
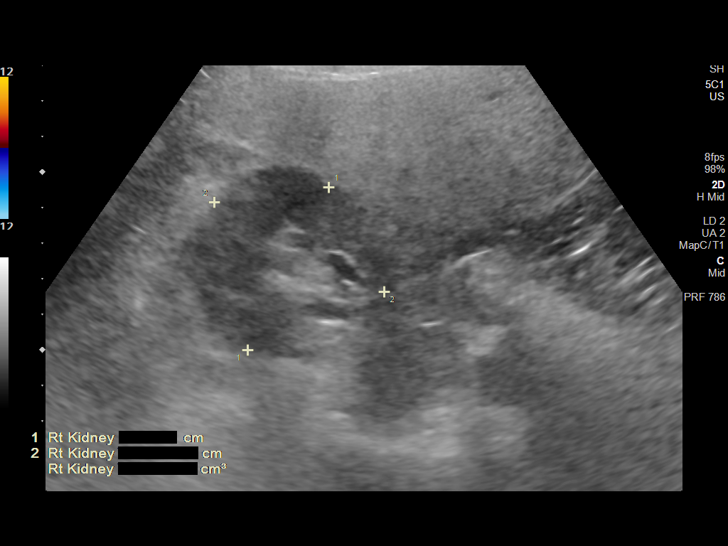
[im 19/42]
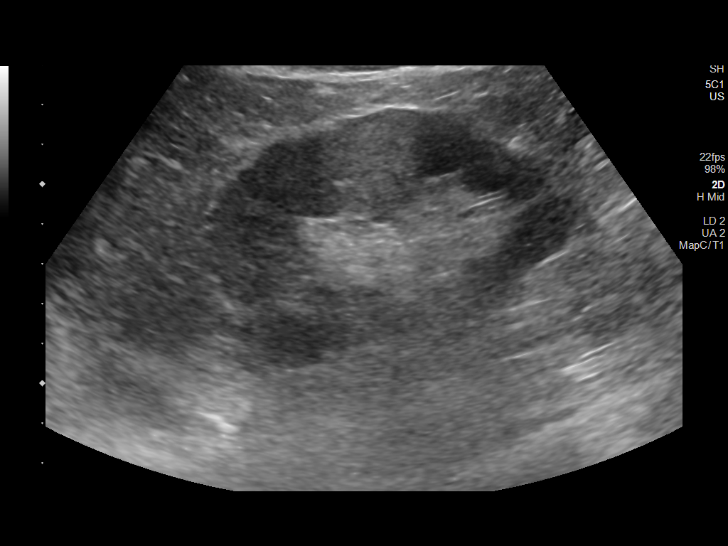
[im 23/42]
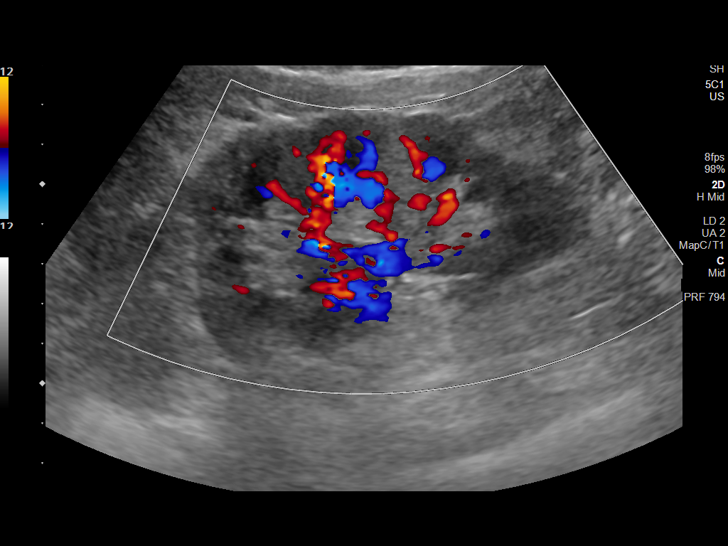
[im 26/42]
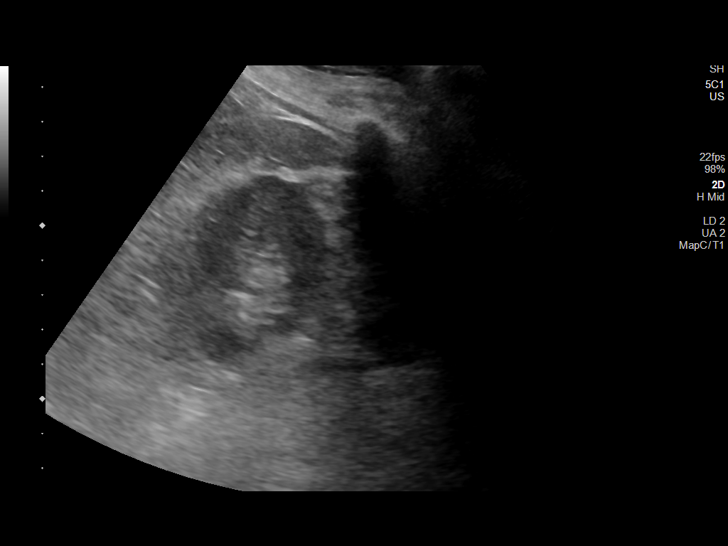
[im 28/42]
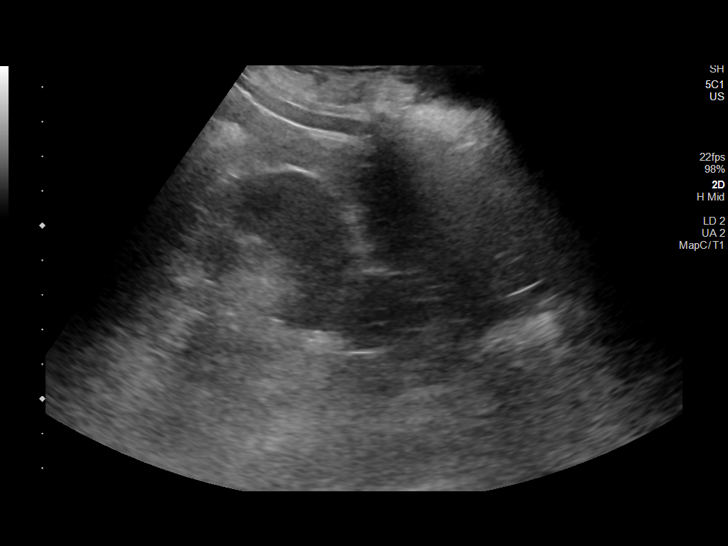
[im 31/42]
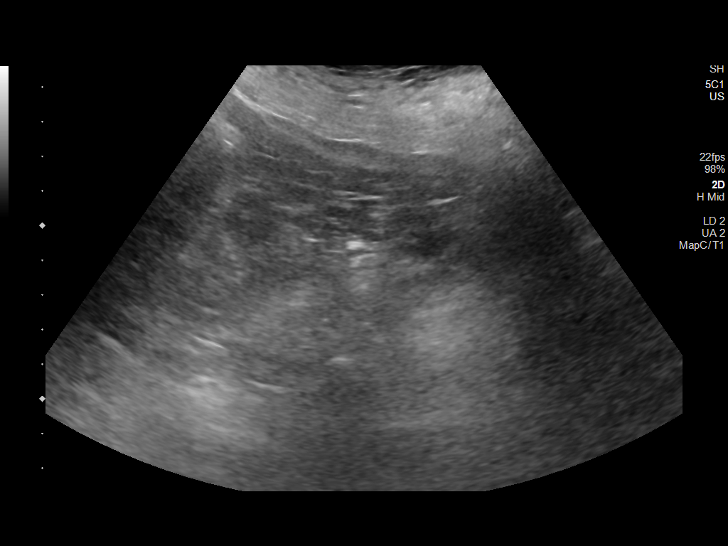
[im 35/42]
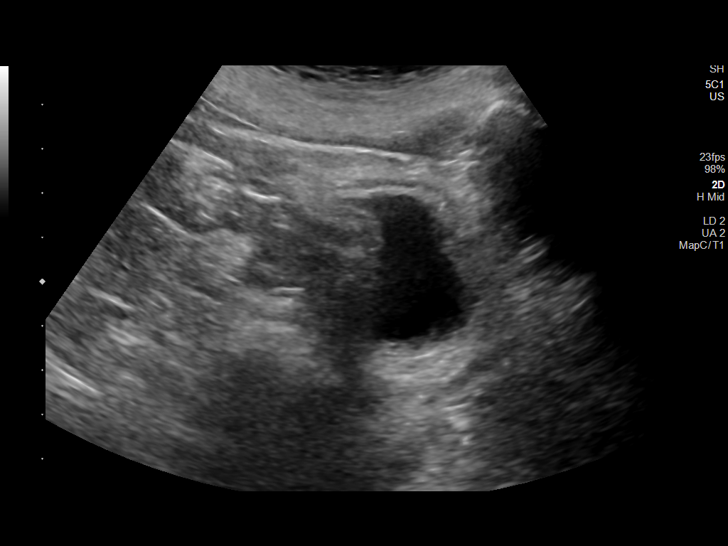
[im 38/42]
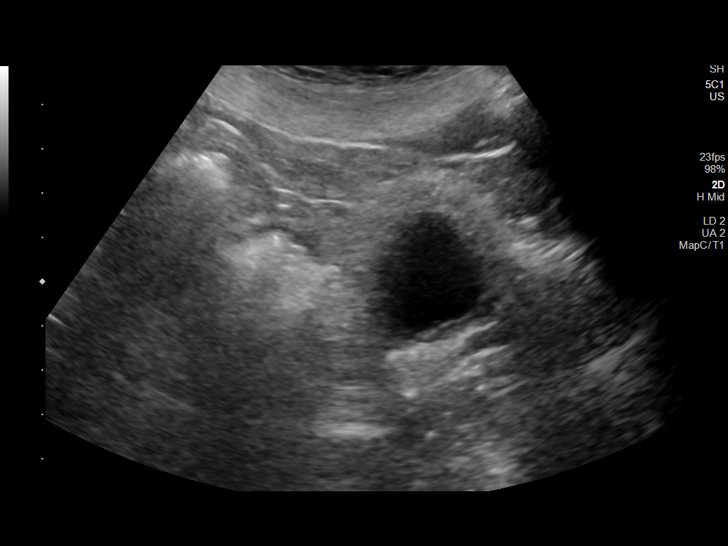
[im 42/42]
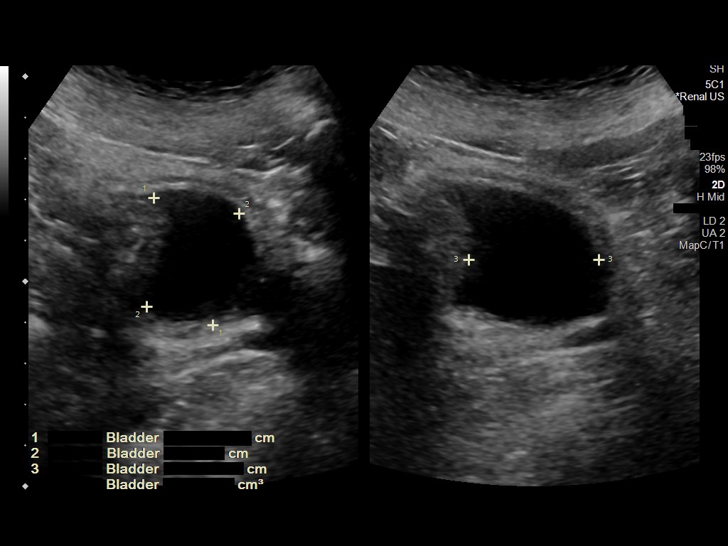

[14 of 25 positions shown; findings below may reference images not displayed]

FINDINGS: Right Kidney:

Renal measurements: 10.3 x 5.1 x 5.4 cm = volume: 149 mL.
Echogenicity within normal limits. No mass or hydronephrosis
visualized.

Left Kidney:

Renal measurements: 10.1 x 5.4 x 4.5 cm = volume: 127 mL.
Echogenicity within normal limits. No mass or hydronephrosis
visualized.

Bladder:

For only minimally distended. Prevoid urinary bladder volume 18 mL.
The patient was unable to void further.

Other:

None.
IMPRESSION: Normal appearance of the bilateral kidneys.

## 2023-07-06 NOTE — Assessment & Plan Note (Signed)
 HTN: BP was elevated yesterday when he went for a new job exam.  (164/94) Good compliance with amlodipine but due to insurance issues was only able to start bisoprolol yesterday. BP today looks good, continue present care. Also ok to proceed with FIT testing w/ new employer  COPD: Slightly increased expiratory time on exam, will  get Markus Daft today, has some insurance issues. Chest wall lump: The doctor yesterday felt something in the anterior chest, on exam they wanted and found this prominent Siefert process.  Observation. RTC CPX 2 to 3 months.  Monitor BPs.

## 2023-07-28 ENCOUNTER — Other Ambulatory Visit: Payer: Self-pay

## 2023-07-28 DIAGNOSIS — R911 Solitary pulmonary nodule: Secondary | ICD-10-CM

## 2023-08-03 ENCOUNTER — Other Ambulatory Visit: Payer: Self-pay | Admitting: Internal Medicine

## 2023-08-24 ENCOUNTER — Other Ambulatory Visit: Payer: Self-pay | Admitting: Internal Medicine

## 2023-09-07 ENCOUNTER — Telehealth: Payer: Self-pay | Admitting: Internal Medicine

## 2023-09-07 NOTE — Telephone Encounter (Signed)
 CT denial, see below.  Resubmit a request with my last office visit note, the CT report from 05/25/2022 and the following letter. To whom it may concern,  Mr. Richard Gates is a patient of mine, he has a history of COPD is a former heavy smoker, last CT chest was done 05/25/2022, he had a 4 mm left upper lobe  pulmonary nodule, according to the guidelines from the Fleischner [employer] given his risk he is due to repeat a CT chest in 12 months. Recommend to approve the CT chest as requested.  =====  Zegarra, Laila M, NT  Corry Storie E, MD; Canter, Kaylyn, CMA After submitting clinicals, the CT of the chest was still denied.  Denial Rationale  We made this decision using [employer] Clinical Guideline 020 for Chest ([place]) CT.  Based on what was provided, you may have disease in your chest, your doctor's request cannot be approved.  Before we can review for an approval, the following notes should be provided: notes from your doctor explaining why you need this test. The notes we received didn't tell us  enough about your problem or why this test is needed. We want to know more about the problem you are having and how this test will help your doctor decide the best way to treat you. This was not included in the information we received.  It is suggested that you follow up with your doctor for the next step in your care.

## 2023-09-14 ENCOUNTER — Other Ambulatory Visit: Payer: Self-pay | Admitting: Internal Medicine

## 2023-09-14 MED ORDER — ALBUTEROL SULFATE HFA 108 (90 BASE) MCG/ACT IN AERS: 2.0000 | INHALATION_SPRAY | Freq: Four times a day (QID) | RESPIRATORY_TRACT | 5 refills | Status: DC | PRN

## 2023-09-14 MED ORDER — AMLODIPINE BESYLATE 5 MG PO TABS: 5.0000 mg | ORAL_TABLET | Freq: Every day | ORAL | 1 refills | Status: AC

## 2023-09-14 NOTE — Telephone Encounter (Signed)
 Copied from CRM [id number]. Topic: Clinical - Medication Refill >> Sep 14, 2023  2:08 PM Trula Gable C wrote: Medication:  albuterol  (VENTOLIN  HFA) 108 (90 Base) MCG/ACT inhale amLODipine  (NORVASC ) 5 MG tablet  Has the patient contacted their pharmacy? Yes (Agent: If no, request that the patient contact the pharmacy for the refill. If patient does not wish to contact the pharmacy document the reason why and proceed with request.) (Agent: If yes, when and what did the pharmacy advise?)  This is the patient's preferred pharmacy:  [id number] - [place], [place] - [address] [place] [address] Phone: [phone] Fax: [phone]  Is this the correct pharmacy for this prescription? Yes If no, delete pharmacy and type the correct one.   Has the prescription been filled recently? No  Is the patient out of the medication? Yes  Has the patient been seen for an appointment in the last year OR does the patient have an upcoming appointment? Yes  Can we respond through MyChart? Yes  Agent: Please be advised that Rx refills may take up to 3 business days. We ask that you follow-up with your pharmacy.

## 2023-09-15 ENCOUNTER — Telehealth: Payer: Self-pay | Admitting: Internal Medicine

## 2023-09-15 NOTE — Telephone Encounter (Signed)
 Per chart review-patient has refills available for Breztri  inhaler located at  [id number] - [place], [place] - [address] AT [facility] [address], [place] [address] Phone: [phone]  Fax: [phone]   Initial order from 08/03/2023 has 5 refills. HIPAA compliant message left for patient to call back to office.

## 2023-09-15 NOTE — Telephone Encounter (Unsigned)
 Copied from CRM [id number]. Topic: Clinical - Medication Refill >> Sep 15, 2023 10:45 AM Luane Rumps D wrote: Medication: budeson-glycopyrrolate-formoterol  (BREZTRI  AEROSPHERE) 160-9-4.8 MCG/ACT AERO  Has the patient contacted their pharmacy? No (Agent: If no, request that the patient contact the pharmacy for the refill. If patient does not wish to contact the pharmacy document the reason why and proceed with request.) (Agent: If yes, when and what did the pharmacy advise?)  This is the patient's preferred pharmacy:  [id number] - [place], [place] - [address] [place] [address] Phone: [phone] Fax: [phone]  Is this the correct pharmacy for this prescription? Yes If no, delete pharmacy and type the correct one.   Has the prescription been filled recently? Yes  Is the patient out of the medication? Yes  Has the patient been seen for an appointment in the last year OR does the patient have an upcoming appointment? Yes  Can we respond through MyChart? Yes  Agent: Please be advised that Rx refills may take up to 3 business days. We ask that you follow-up with your pharmacy.

## 2023-09-19 NOTE — Telephone Encounter (Signed)
 I can send the letter- will just need to know where to send it and the case ID please. Sherri tried to help while you were out but couldn't find the information.

## 2023-09-19 NOTE — Telephone Encounter (Signed)
 Richard Gates, Kalyn Needs a CT chest

## 2023-09-27 NOTE — Telephone Encounter (Signed)
 Appeal letter mailed to [employer] at [address]  [address] , [place] [address].

## 2023-10-02 ENCOUNTER — Encounter: Payer: Self-pay | Admitting: Internal Medicine

## 2023-10-02 ENCOUNTER — Ambulatory Visit (INDEPENDENT_AMBULATORY_CARE_PROVIDER_SITE_OTHER): Admitting: Internal Medicine

## 2023-10-02 VITALS — BP 132/62 | HR 87 | Temp 97.7°F | Resp 16 | Ht 67.25 in | Wt 168.5 lb

## 2023-10-02 DIAGNOSIS — Z0000 Encounter for general adult medical examination without abnormal findings: Secondary | ICD-10-CM | POA: Diagnosis not present

## 2023-10-02 DIAGNOSIS — E785 Hyperlipidemia, unspecified: Secondary | ICD-10-CM | POA: Diagnosis not present

## 2023-10-02 DIAGNOSIS — M109 Gout, unspecified: Secondary | ICD-10-CM

## 2023-10-02 DIAGNOSIS — E538 Deficiency of other specified B group vitamins: Secondary | ICD-10-CM

## 2023-10-02 DIAGNOSIS — Z0001 Encounter for general adult medical examination with abnormal findings: Secondary | ICD-10-CM | POA: Diagnosis not present

## 2023-10-02 DIAGNOSIS — Z23 Encounter for immunization: Secondary | ICD-10-CM | POA: Diagnosis not present

## 2023-10-02 DIAGNOSIS — I10 Essential (primary) hypertension: Secondary | ICD-10-CM | POA: Diagnosis not present

## 2023-10-02 DIAGNOSIS — R739 Hyperglycemia, unspecified: Secondary | ICD-10-CM | POA: Diagnosis not present

## 2023-10-02 NOTE — Progress Notes (Signed)
 Subjective:    Patient ID: NOEMI ISHMAEL, male    DOB: 01/03/1961, 63 y.o.   MRN: [id number]  DOS:  10/02/2023 Type of visit - description: CPX  Here for CPX In general feeling well, has no major concerns. No LUTS except for occasional nocturia  Review of Systems  Other than above, a 14 point review of systems is negative     Past Medical History:  Diagnosis Date   COPD (chronic obstructive pulmonary disease) (HCC)    Coronary artery calcification seen on CAT scan 08/2019   Coronary calcium  score 103; short LM with<25% mixed calcific plaque (minimal); aortic atherosclerosis with normal size.  No dissection.  No aortic valve calcification   GERD (gastroesophageal reflux disease)    Hyperlipidemia    Hypertension     Past Surgical History:  Procedure Laterality Date   APPENDECTOMY     BIOPSY  10/02/2021   Procedure: BIOPSY;  Surgeon: Nannette Babe, MD;  Location: [facility] ENDOSCOPY;  Service: Gastroenterology;;   COLONOSCOPY  08/24/2016   COLONOSCOPY WITH PROPOFOL  N/A 10/02/2021   Procedure: COLONOSCOPY WITH PROPOFOL ;  Surgeon: Nannette Babe, MD;  Location: [facility] ENDOSCOPY;  Service: Gastroenterology;  Laterality: N/A;   ESOPHAGOGASTRODUODENOSCOPY (EGD) WITH PROPOFOL  N/A 10/02/2021   Procedure: ESOPHAGOGASTRODUODENOSCOPY (EGD) WITH PROPOFOL ;  Surgeon: Nannette Babe, MD;  Location: [facility] ENDOSCOPY;  Service: Gastroenterology;  Laterality: N/A;   POLYPECTOMY     TRANSTHORACIC ECHOCARDIOGRAM  04/2018   EF 65-70% (vigorous). No RWMA.  Gr 1 DD.  Normal valves.  (In setting of COPD exacerbation)   Social History   Socioeconomic History   Marital status: Married    Spouse name: Not on file   Number of children: 1   Years of education: Not on file   Highest education level: GED or equivalent  Occupational History   Occupation: works at the mail room in a local company  Tobacco Use   Smoking status: Former    Current packs/day: 0.00    Average packs/day: 0.8 packs/day for 41.0 years (30.8  ttl pk-yrs)    Types: Cigarettes    Start date: 55    Quit date: 2020    Years since quitting: 5.4   Smokeless tobacco: Never   Tobacco comments:    Quit tobacco 2020  Vaping Use   Vaping status: Never Used  Substance and Sexual Activity   Alcohol use: Yes    Comment: drinks [employer] Lemonade, week ends   Drug use: Not Currently    Types: Marijuana    Comment: last use in maybe 2018   Sexual activity: Not on file  Other Topics Concern   Not on file  Social History Narrative   Household- pt and wife   1 daughter    4 g-children   2 g-g child   Social Drivers of Corporate investment banker Strain: High Risk (05/22/2023)   Overall Financial Resource Strain (CARDIA)    Difficulty of Paying Living Expenses: Hard  Food Insecurity: Food Insecurity Present (05/22/2023)   Hunger Vital Sign    Worried About Running Out of Food in the Last Year: Sometimes true    Ran Out of Food in the Last Year: Sometimes true  Transportation Needs: No Transportation Needs (05/22/2023)   PRAPARE - Administrator, Civil Service (Medical): No    Lack of Transportation (Non-Medical): No  Physical Activity: Insufficiently Active (05/22/2023)   Exercise Vital Sign    Days of Exercise per Week: 3  days    Minutes of Exercise per Session: 30 min  Stress: Stress Concern Present (05/22/2023)   [employer] of Occupational Health - Occupational Stress Questionnaire    Feeling of Stress : Rather much  Social Connections: Socially Integrated (05/22/2023)   Social Connection and Isolation Panel [NHANES]    Frequency of Communication with Friends and Family: Three times a week    Frequency of Social Gatherings with Friends and Family: Twice a week    Attends Religious Services: More than 4 times per year    Active Member of [employer] or Organizations: Yes    Attends Engineer, structural: More than 4 times per year    Marital Status: Married  Catering manager Violence: Unknown (08/06/2021)    Received from [employer], [facility]   HITS    Physically Hurt: Not on file    Insult or Talk Down To: Not on file    Threaten Physical Harm: Not on file    Scream or Curse: Not on file    Current Outpatient Medications  Medication Instructions   albuterol  (VENTOLIN  HFA) 108 (90 Base) MCG/ACT inhaler 2 puffs, Inhalation, Every 6 hours PRN   allopurinol  (ZYLOPRIM ) 300 mg, Oral, Daily   amLODipine  (NORVASC ) 5 mg, Oral, Daily   bisoprolol  (ZEBETA ) 5 mg, Oral, Daily   budeson-glycopyrrolate-formoterol  (BREZTRI  AEROSPHERE) 160-9-4.8 MCG/ACT AERO 2 puffs, Inhalation, 2 times daily   cholecalciferol  (VITAMIN D3) 2,000 Units, Daily   colchicine  0.6 mg, Oral, 2 times daily PRN   CoQ-10 200 mg, Daily   cyanocobalamin  (VITAMIN B12) 1,000 mcg, Daily   rosuvastatin  (CRESTOR ) 20 mg, Oral, Daily at bedtime       Objective:   Physical Exam BP 132/62   Pulse 87   Temp 97.7 F (36.5 C) (Oral)   Resp 16   Ht 5' 7.25" (1.708 m)   Wt 168 lb 8 oz (76.4 kg)   SpO2 97%   BMI 26.19 kg/m  General: Well developed, NAD, BMI noted Neck: No  thyromegaly  HEENT:  Normocephalic . Face symmetric, atraumatic Lungs:  CTA B Normal respiratory effort, no intercostal retractions, no accessory muscle use. Heart: RRR,  no murmur.  Abdomen:  Not distended, soft, non-tender. No rebound or rigidity.   Lower extremities: no pretibial edema bilaterally  Skin: Exposed areas without rash. Not pale. Not jaundice Neurologic:  alert & oriented X3.  Speech normal, gait appropriate for age and unassisted Strength symmetric and appropriate for age.  Psych: Cognition and judgment appear intact.  Cooperative with normal attention span and concentration.  Behavior appropriate. No anxious or depressed appearing.     Assessment   Problem list. Hyperglycemia HTN GERD Hyperlipidemia, high TG Allergies , nasal  Pulmonary: ---COPD ( PFTs 2013 showed ratio of 58, FEV1 of 1.78- 52% without significant  bronchodilator response, TLC was 77%   DLCO 78%) ---Smoker: quit 2020 ---Pulmonary nodule.  Incidentally found PET scan 12-2021.  CT needed 1 year CV: ---Aortic sclerosis per CT 05-2020 ---Coronary CTA, Ca+ Co score 103.0, L main artery, saw cards, probably still a low risk situation.  Plan is CV RF control, see cards note 11/08/2019 Vit d def B12 deficiency Gout  DVT L leg dx  05-31-2021   PLAN: Here for CPX -Td 2022 - shingrix x2 per pt  -PNM 23 : 2020; PNM 20 : today -Vaccines I recommend: Flu shot every fall,   COVID booster is an option -CCS:  cscope 07-2016, cscope 09-2021, next per per gi -Prostate  ca screening: No symptoms, check a PSA - Tobacco: Not smoking -Labs: CMP CBC A1c PSA uric acid -  Diet and exercise discussed  Other issues addressed today Hyperglycemia: Check A1c HTN: Infrequent ambulatory BPs but when checked is okay.  Continue amlodipine , bisoprolol . Hyperlipidemia, high triglycerides: On Crestor , last FLP okay, recheck on RTC COPD: On Breztri , albuterol .  Seems to be doing well. Pulmonary nodule: Follow-up CT denied by his insurance, we mailed L letter to them, awaiting for answer B12 deficiency: Checking levels on OTC supplements Chromogranin A elevation, ferritin elevation: Per hematology. RTC 4-5  months

## 2023-10-02 NOTE — Patient Instructions (Addendum)
 Vaccines I recommend: Flu shot every fall COVID booster is a consideration   Check the  blood pressure regularly Blood pressure goal:  between 110/65 and  135/85. If it is consistently higher or lower, let me know     GO TO THE LAB :  Get the blood work   Your results will be posted on MyChart with my comments  Next office visit for a checkup in 4 to 5 months Please make an appointment before you leave today       "Health Care Power of attorney" (Also know as a  "Living will" or  Advance care planning documents)  If you already have a living will or healthcare power of attorney, is recommended you bring the copy to be scanned in your chart.   The document will be available to all the doctors you see in the system.  If you are over 25 y/o and don't have the document, please read:  Advance care planning is a process that supports adults in  understanding and sharing their preferences regarding future medical care.  The patient's preferences are recorded in documents called Advance Directives and the can be modified at any time while the patient is in full mental capacity.     More information at: [email or web address]

## 2023-10-03 ENCOUNTER — Encounter: Payer: Self-pay | Admitting: Internal Medicine

## 2023-10-03 LAB — COMPREHENSIVE METABOLIC PANEL WITH GFR
ALT: 39 U/L (ref 0–53)
AST: 46 U/L — ABNORMAL HIGH (ref 0–37)
Albumin: 4.2 g/dL (ref 3.5–5.2)
Alkaline Phosphatase: 79 U/L (ref 39–117)
BUN: 28 mg/dL — ABNORMAL HIGH (ref 6–23)
CO2: 28 meq/L (ref 19–32)
Calcium: 9.7 mg/dL (ref 8.4–10.5)
Chloride: 101 meq/L (ref 96–112)
Creatinine, Ser: 1.05 mg/dL (ref 0.40–1.50)
GFR: 75.56 mL/min (ref >60.00)
Glucose, Bld: 91 mg/dL (ref 70–99)
Potassium: 4.2 meq/L (ref 3.5–5.1)
Sodium: 137 meq/L (ref 135–145)
Total Bilirubin: 0.5 mg/dL (ref 0.2–1.2)
Total Protein: 6.7 g/dL (ref 6.0–8.3)

## 2023-10-03 LAB — CBC WITH DIFFERENTIAL/PLATELET
Basophils Absolute: 0.1 10*3/uL (ref 0.0–0.1)
Basophils Relative: 1 % (ref 0.0–3.0)
Eosinophils Absolute: 0.1 10*3/uL (ref 0.0–0.7)
Eosinophils Relative: 0.7 % (ref 0.0–5.0)
HCT: 35.6 % — ABNORMAL LOW (ref 39.0–52.0)
Hemoglobin: 12.1 g/dL — ABNORMAL LOW (ref 13.0–17.0)
Lymphocytes Relative: 14.5 % (ref 12.0–46.0)
Lymphs Abs: 1.1 10*3/uL (ref 0.7–4.0)
MCHC: 34 g/dL (ref 30.0–36.0)
MCV: 96.6 fl (ref 78.0–100.0)
Monocytes Absolute: 0.9 10*3/uL (ref 0.1–1.0)
Monocytes Relative: 12 % (ref 3.0–12.0)
Neutro Abs: 5.5 10*3/uL (ref 1.4–7.7)
Neutrophils Relative %: 71.8 % (ref 43.0–77.0)
Platelets: 311 10*3/uL (ref 150.0–400.0)
RBC: 3.68 Mil/uL — ABNORMAL LOW (ref 4.22–5.81)
RDW: 15 % (ref 11.5–15.5)
WBC: 7.7 10*3/uL (ref 4.0–10.5)

## 2023-10-03 LAB — PSA: PSA: 0.29 ng/mL (ref 0.10–4.00)

## 2023-10-03 LAB — B12 AND FOLATE PANEL
Folate: 6.1 ng/mL (ref >5.9)
Vitamin B-12: 812 pg/mL (ref 211–911)

## 2023-10-03 LAB — URIC ACID: Uric Acid, Serum: 3.9 mg/dL — ABNORMAL LOW (ref 4.0–7.8)

## 2023-10-03 LAB — HEMOGLOBIN A1C: Hgb A1c MFr Bld: 5.3 % (ref 4.6–6.5)

## 2023-10-03 NOTE — Assessment & Plan Note (Signed)
 Here for CPX    Other issues addressed today Hyperglycemia: Check A1c HTN: Infrequent ambulatory BPs but when checked is okay.  Continue amlodipine , bisoprolol . Hyperlipidemia, high triglycerides: On Crestor , last FLP okay, recheck on RTC COPD: On Breztri , albuterol .  Seems to be doing well. Pulmonary nodule: Follow-up CT denied by his insurance, we mailed L letter to them, awaiting for answer B12 deficiency: Checking levels on OTC supplements Chromogranin A elevation, ferritin elevation: Per hematology. RTC 4-5  months

## 2023-10-03 NOTE — Assessment & Plan Note (Signed)
 Here for CPX -Td 2022 - shingrix x2 per pt  -PNM 23 : 2020; PNM 20 : today -Vaccines I recommend: Flu shot every fall,   COVID booster is an option -CCS:  cscope 07-2016, cscope 09-2021, next per per gi -Prostate ca screening: No symptoms, check a PSA - Tobacco: Not smoking -Labs: CMP CBC A1c PSA uric acid -  Diet and exercise discussed

## 2023-10-04 ENCOUNTER — Ambulatory Visit: Payer: Self-pay | Admitting: Internal Medicine

## 2023-11-22 ENCOUNTER — Other Ambulatory Visit: Payer: Self-pay | Admitting: Internal Medicine

## 2023-11-22 MED ORDER — BREZTRI AEROSPHERE 160-9-4.8 MCG/ACT IN AERO: 2.0000 | INHALATION_SPRAY | Freq: Two times a day (BID) | RESPIRATORY_TRACT | 5 refills | Status: DC

## 2023-11-22 MED ORDER — ALBUTEROL SULFATE HFA 108 (90 BASE) MCG/ACT IN AERS: 2.0000 | INHALATION_SPRAY | Freq: Four times a day (QID) | RESPIRATORY_TRACT | 5 refills | Status: DC | PRN

## 2023-11-22 NOTE — Telephone Encounter (Signed)
 Copied from CRM #[id number]. Topic: Clinical - Medication Refill >> Nov 22, 2023 12:32 PM Martinique E wrote: Medication: budeson-glycopyrrolate-formoterol  (BREZTRI  AEROSPHERE) 160-9-4.8 MCG/ACT AERO albuterol  (VENTOLIN  HFA) 108 (90 Base) MCG/ACT inhaler  Has the patient contacted their pharmacy? Yes (Agent: If no, request that the patient contact the pharmacy for the refill. If patient does not wish to contact the pharmacy document the reason why and proceed with request.) (Agent: If yes, when and what did the pharmacy advise?)  This is the patient's preferred pharmacy:  [id number] - [place], [place] - [address] [place] [address] Phone: [phone] Fax: [phone]  Is this the correct pharmacy for this prescription? Yes If no, delete pharmacy and type the correct one.   Has the prescription been filled recently? No  Is the patient out of the medication? Yes  Has the patient been seen for an appointment in the last year OR does the patient have an upcoming appointment? Yes  Can we respond through MyChart? Yes  Agent: Please be advised that Rx refills may take up to 3 business days. We ask that you follow-up with your pharmacy.

## 2024-01-24 ENCOUNTER — Encounter: Payer: Self-pay | Admitting: Internal Medicine

## 2024-01-24 DIAGNOSIS — R911 Solitary pulmonary nodule: Secondary | ICD-10-CM

## 2024-01-24 DIAGNOSIS — J449 Chronic obstructive pulmonary disease, unspecified: Secondary | ICD-10-CM

## 2024-01-25 NOTE — Telephone Encounter (Signed)
 Pt called back and stated that he also needs a personal form filled out along with the letter. When asked for details of the type of form he responded that pcp already knows. He stated that he will have it faxed to the office.

## 2024-01-25 NOTE — Telephone Encounter (Signed)
 Please prepare a letter.  To whom it may concern  Richard Gates is a patient of mine. He has a history of COPD or emphysema. Sincerely

## 2024-01-26 ENCOUNTER — Telehealth: Payer: Self-pay | Admitting: Internal Medicine

## 2024-01-26 NOTE — Telephone Encounter (Signed)
 Pt dropped off a form for provider to complete regarding breathalyzer. States must be completed by board certified pulmonologist if Pt has a respiratory disease.

## 2024-01-26 NOTE — Telephone Encounter (Signed)
 Pt dropped off paperwork for pcp to complete. Pt wants to pick up papers when done please contact pt when forms are ready.

## 2024-01-26 NOTE — Telephone Encounter (Signed)
 Refer patient to pulmonary

## 2024-01-26 NOTE — Addendum Note (Signed)
 Addended by: Axtyn Woehler D on: 01/26/2024 04:34 PM   Modules accepted: Orders

## 2024-02-26 ENCOUNTER — Ambulatory Visit (INDEPENDENT_AMBULATORY_CARE_PROVIDER_SITE_OTHER): Admitting: Internal Medicine

## 2024-02-26 ENCOUNTER — Encounter: Payer: Self-pay | Admitting: Internal Medicine

## 2024-02-26 VITALS — BP 138/88 | HR 84 | Temp 97.6°F | Resp 16 | Ht 67.25 in | Wt 172.2 lb

## 2024-02-26 DIAGNOSIS — Z23 Encounter for immunization: Secondary | ICD-10-CM | POA: Diagnosis not present

## 2024-02-26 DIAGNOSIS — I10 Essential (primary) hypertension: Secondary | ICD-10-CM

## 2024-02-26 DIAGNOSIS — F32A Depression, unspecified: Secondary | ICD-10-CM | POA: Diagnosis not present

## 2024-02-26 DIAGNOSIS — R310 Gross hematuria: Secondary | ICD-10-CM | POA: Diagnosis not present

## 2024-02-26 DIAGNOSIS — E785 Hyperlipidemia, unspecified: Secondary | ICD-10-CM

## 2024-02-26 DIAGNOSIS — F419 Anxiety disorder, unspecified: Secondary | ICD-10-CM

## 2024-02-26 DIAGNOSIS — J449 Chronic obstructive pulmonary disease, unspecified: Secondary | ICD-10-CM

## 2024-02-26 DIAGNOSIS — E559 Vitamin D deficiency, unspecified: Secondary | ICD-10-CM

## 2024-02-26 NOTE — Patient Instructions (Signed)
 GO TO THE LAB :  Get the blood work    Then, go to the front desk for the checkout Please make an appointment for a checkup in 4 months   Consider seeing a counselor about anxiety and depression      YOUR PLAN:  GROSS HEMATURIA: You have noticed blood in your urine on two occasions this month. -We have ordered a urinalysis (UA) and urine culture to investigate further. -We have also ordered a prostate-specific antigen (PSA) test, a basic metabolic panel (BMP), and a complete blood count (CBC). -You will be referred to a urologist for further evaluation.  PULMONARY NODULE: You have a pulmonary nodule that needs further evaluation. -We   recommend discussing this with a pulmonologist.  DEPRESSION AND ANXIETY: You are experiencing moderate depression and anxiety. -We have provided you with a list of local psychologists for counseling. -Please call us  if your symptoms increase or if you feel you need medication.  HYPERTENSION: Your high blood pressure is well-controlled with your current medications. -Continue taking amlodipine  and bisoprolol  as prescribed.  HYPERLIPIDEMIA: You have high cholesterol, which is being managed with medication. -Continue taking rosuvastatin  as prescribed. -We have ordered a fasting lipid panel (FLP) to check your cholesterol levels.  VITAMIN D  DEFICIENCY: You have a vitamin D  deficiency and are taking supplements. -We will check your vitamin D  levels to ensure they are within the normal range.   ACID REFLUX: You are experiencing occasional acid reflux, possibly related to your diet. -Consider taking over-the-counter acid reflux medication if needed.

## 2024-02-26 NOTE — Assessment & Plan Note (Signed)
 Gross hematuria New problem.  2 episodes in one month without systemic symptoms.  No history of kidney stones Plan: Order urinalysis (UA) and urine culture, PSA, BMP, CBC, refer to urology. Pulmonary nodule Pulmonary nodule, see CT report (January 2024);  I have been unable to arrange for pulmonary CT due to insurance denial.   - Recommend discussing with pulmonology  Depression and anxiety Moderate depression and anxiety with PHQ-9 score of 12 and GAD score of 11. Prefers counseling over medication. - Provide list of local psychologists for counseling. - Encourage to call if symptoms increase or if medication is needed. HTN: Seems controlled on amlodipine , bisoprolol  High cholesterol: Managed with rosuvastatin .  No change, check FLP Vitamin D  deficiency:  On vitamin D  supplements.  Check labs COPD: Requested a letter stating that he has COPD and is unable to use alcohol or breathalyzer.  Form needed to be filed by a pulmonologist, office visit with them is pending Acid reflux Experiencing occasional acid reflux, possibly diet-related. - Consider over-the-counter acid reflux medication. RTC 4 months

## 2024-02-26 NOTE — Progress Notes (Signed)
 Subjective:    Patient ID: Richard Gates, male    DOB: 1961/01/15, 63 y.o.   MRN: [id number]  DOS:  02/26/2024 Follow-up  Discussed the use of AI scribe software for clinical note transcription with the patient, who gave verbal consent to proceed.  History of Present Illness  Gross hematuria - Blood in urine observed on two occasions this month - Describes sxs as a streak of blood rather than the entire urine being red - No fever, chills, nausea, vomiting, or significant abdominal pain - No history of kidney stones - No recent injuries or falls - Occasional side pain, attributed to work-related activities - Has some nocturia  Fatigue and decreased energy - General lack of energy and feeling 'blah' - No chest pain or difficulty breathing at rest - Experiences harder breathing only with overexertion at work  Mood changes - Feels 'a little depressed,' attributing it to aging - No suicidal thoughts    Review of Systems See above   Past Medical History:  Diagnosis Date   COPD (chronic obstructive pulmonary disease) (HCC)    Coronary artery calcification seen on CAT scan 08/2019   Coronary calcium  score 103; short LM with<25% mixed calcific plaque (minimal); aortic atherosclerosis with normal size.  No dissection.  No aortic valve calcification   GERD (gastroesophageal reflux disease)    Hyperlipidemia    Hypertension     Past Surgical History:  Procedure Laterality Date   APPENDECTOMY     BIOPSY  10/02/2021   Procedure: BIOPSY;  Surgeon: Albertus Gordy HERO, MD;  Location: [facility] ENDOSCOPY;  Service: Gastroenterology;;   COLONOSCOPY  08/24/2016   COLONOSCOPY WITH PROPOFOL  N/A 10/02/2021   Procedure: COLONOSCOPY WITH PROPOFOL ;  Surgeon: Albertus Gordy HERO, MD;  Location: [facility] ENDOSCOPY;  Service: Gastroenterology;  Laterality: N/A;   ESOPHAGOGASTRODUODENOSCOPY (EGD) WITH PROPOFOL  N/A 10/02/2021   Procedure: ESOPHAGOGASTRODUODENOSCOPY (EGD) WITH PROPOFOL ;  Surgeon: Albertus Gordy HERO, MD;   Location: [facility] ENDOSCOPY;  Service: Gastroenterology;  Laterality: N/A;   POLYPECTOMY     TRANSTHORACIC ECHOCARDIOGRAM  04/2018   EF 65-70% (vigorous). No RWMA.  Gr 1 DD.  Normal valves.  (In setting of COPD exacerbation)    Current Outpatient Medications  Medication Instructions   albuterol  (VENTOLIN  HFA) 108 (90 Base) MCG/ACT inhaler 2 puffs, Inhalation, Every 6 hours PRN   allopurinol  (ZYLOPRIM ) 300 mg, Oral, Daily   amLODipine  (NORVASC ) 5 mg, Oral, Daily   bisoprolol  (ZEBETA ) 5 mg, Oral, Daily   budesonide -glycopyrrolate-formoterol  (BREZTRI  AEROSPHERE) 160-9-4.8 MCG/ACT AERO inhaler 2 puffs, Inhalation, 2 times daily   cholecalciferol  (VITAMIN D3) 2,000 Units, Daily   colchicine  0.6 mg, Oral, 2 times daily PRN   CoQ-10 200 mg, Daily   cyanocobalamin  (VITAMIN B12) 1,000 mcg, Daily   rosuvastatin  (CRESTOR ) 20 mg, Oral, Daily at bedtime       Objective:   Physical Exam BP 138/88   Pulse 84   Temp 97.6 F (36.4 C) (Oral)   Resp 16   Ht 5' 7.25 (1.708 m)   Wt 172 lb 4 oz (78.1 kg)   SpO2 94%   BMI 26.78 kg/m  General:   Well developed, NAD, BMI noted.  HEENT:  Normocephalic . Face symmetric, atraumatic Lungs:  CTA B Normal respiratory effort, no intercostal retractions, no accessory muscle use. Heart: RRR,  no murmur.  Abdomen:  Not distended, soft, non-tender. No rebound or rigidity.   Skin: Not pale. Not jaundice DRE: Normal sphincter tone, prostate with minimal enlargement, no tender nonnodular Lower extremities:  no pretibial edema bilaterally  Neurologic:  alert & oriented X3.  Speech normal, gait appropriate for age and unassisted Psych--  Cognition and judgment appear intact.  Cooperative with normal attention span and concentration.  Behavior appropriate. No anxious or depressed appearing.     Assessment     Problem list. Hyperglycemia HTN GERD Hyperlipidemia, high TG Allergies , nasal  Pulmonary: ---COPD ( PFTs 2013 showed ratio of 58, FEV1 of  1.78- 52% without significant bronchodilator response, TLC was 77%   DLCO 78%) ---Smoker: quit 2020 ---Pulmonary nodule.  Incidentally found PET scan 12-2021.  CT needed 1 year CV: ---Aortic sclerosis per CT 05-2020 ---Coronary CTA, Ca+ Co score 103.0, L main artery, saw cards, probably still a low risk situation.  Plan is CV RF control, see cards note 11/08/2019 Vit d def B12 deficiency Gout  DVT L leg dx  05-31-2021 Chromogranin A elevation, ferritin elevation: Saw hematology 01/12/2023  Assessment & Plan Gross hematuria New problem.  2 episodes in one month without systemic symptoms.  No history of kidney stones Plan: Order urinalysis (UA) and urine culture, PSA, BMP, CBC, refer to urology. Pulmonary nodule Pulmonary nodule, see CT report (January 2024);  I have been unable to arrange for pulmonary CT due to insurance denial.   - Recommend discussing with pulmonology  Depression and anxiety Moderate depression and anxiety with PHQ-9 score of 12 and GAD score of 11. Prefers counseling over medication. - Provide list of local psychologists for counseling. - Encourage to call if symptoms increase or if medication is needed. HTN: Seems controlled on amlodipine , bisoprolol  High cholesterol: Managed with rosuvastatin .  No change, check FLP Vitamin D  deficiency:  On vitamin D  supplements.  Check labs COPD: Requested a letter stating that he has COPD and is unable to use alcohol or breathalyzer.  Form needed to be filed by a pulmonologist, office visit with them is pending Acid reflux Experiencing occasional acid reflux, possibly diet-related. - Consider over-the-counter acid reflux medication. RTC 4 months

## 2024-02-27 ENCOUNTER — Ambulatory Visit (INDEPENDENT_AMBULATORY_CARE_PROVIDER_SITE_OTHER)

## 2024-02-27 ENCOUNTER — Ambulatory Visit: Payer: Self-pay | Admitting: Internal Medicine

## 2024-02-27 ENCOUNTER — Encounter: Payer: Self-pay | Admitting: Internal Medicine

## 2024-02-27 ENCOUNTER — Ambulatory Visit: Admitting: Internal Medicine

## 2024-02-27 VITALS — BP 128/83 | HR 71 | Temp 97.7°F | Ht 67.0 in | Wt 176.0 lb

## 2024-02-27 DIAGNOSIS — Z87891 Personal history of nicotine dependence: Secondary | ICD-10-CM | POA: Insufficient documentation

## 2024-02-27 DIAGNOSIS — J449 Chronic obstructive pulmonary disease, unspecified: Secondary | ICD-10-CM | POA: Diagnosis not present

## 2024-02-27 MED ORDER — BREZTRI AEROSPHERE 160-9-4.8 MCG/ACT IN AERO: 2.0000 | INHALATION_SPRAY | Freq: Two times a day (BID) | RESPIRATORY_TRACT | Status: DC

## 2024-02-27 NOTE — Progress Notes (Signed)
 Richard Gates, male    DOB: 12/05/1960   MRN: [id number]   Brief patient profile:  7   yobm quit smoking 2019  referred to pulmonary clinic 02/27/2024 by Dr Amon for copd eval       Pt not previously seen by [facility] service.    History of Present Illness  02/27/2024  Pulmonary/ 1st office eval/Adela Esteban breztri   5 am / 5pm  Chief Complaint  Patient presents with   COPD   Consult   Shortness of Breath    Patient states breathing is worse on activity.  Dyspnea:  slow pace can go anywhere/ struggles a bit with inclines or carrying heavy objects like furniture o Cough: no am flares/ sporadic pattern non productive  Sleep: flat bed / one pillow s resp cc  SABA use: around 9 pm  02 ldz:wnwz  LDSCT:order   No obvious day to day or daytime pattern/variability or assoc excess/ purulent sputum or mucus plugs or hemoptysis or cp or chest tightness, subjective wheeze or overt sinus or hb symptoms.    Also denies any obvious fluctuation of symptoms with weather or environmental changes or other aggravating or alleviating factors except as outlined above   No unusual exposure hx or h/o childhood pna/ asthma or knowledge of premature birth.  Current Allergies, Complete Past Medical History, Past Surgical History, Family History, and Social History were reviewed in [employer] record.  ROS  The following are not active complaints unless bolded Hoarseness, sore throat, dysphagia, dental problems, itching, sneezing,  nasal congestion or discharge of excess mucus or purulent secretions, ear ache,   fever, chills, sweats, unintended wt loss or wt gain, classically pleuritic or exertional cp,  orthopnea pnd or arm/hand swelling  or leg swelling, presyncope, palpitations, abdominal pain, anorexia, nausea, vomiting, diarrhea  or change in bowel habits or change in bladder habits, change in stools or change in urine, dysuria, hematuria,  rash, arthralgias, visual complaints, headache,  numbness, weakness or ataxia or problems with walking or coordination,  change in mood or  memory.             Outpatient Medications Prior to Visit  Medication Sig Dispense Refill   albuterol  (VENTOLIN  HFA) 108 (90 Base) MCG/ACT inhaler Inhale 2 puffs into the lungs every 6 (six) hours as needed for wheezing or shortness of breath. 18 g 5   allopurinol  (ZYLOPRIM ) 300 MG tablet Take 1 tablet (300 mg total) by mouth daily. 90 tablet 1   amLODipine  (NORVASC ) 5 MG tablet Take 1 tablet (5 mg total) by mouth daily. 90 tablet 1   bisoprolol  (ZEBETA ) 5 MG tablet Take 1 tablet (5 mg total) by mouth daily. 30 tablet 6   budesonide -glycopyrrolate-formoterol  (BREZTRI  AEROSPHERE) 160-9-4.8 MCG/ACT AERO inhaler Inhale 2 puffs into the lungs 2 (two) times daily. 10.7 g 5   cholecalciferol  (VITAMIN D3) 25 MCG (1000 UNIT) tablet Take 2,000 Units by mouth daily.     Coenzyme Q10 (COQ-10) 200 MG CAPS Take 200 mg by mouth daily.     colchicine  0.6 MG tablet Take 1 tablet (0.6 mg total) by mouth 2 (two) times daily as needed (gout flare). 60 tablet 1   cyanocobalamin  (VITAMIN B12) 1000 MCG tablet Take 1,000 mcg by mouth daily.     rosuvastatin  (CRESTOR ) 20 MG tablet Take 1 tablet (20 mg total) by mouth at bedtime. 90 tablet 1   No facility-administered medications prior to visit.    Past Medical History:  Diagnosis Date  COPD (chronic obstructive pulmonary disease) (HCC)    Coronary artery calcification seen on CAT scan 08/2019   Coronary calcium  score 103; short LM with<25% mixed calcific plaque (minimal); aortic atherosclerosis with normal size.  No dissection.  No aortic valve calcification   GERD (gastroesophageal reflux disease)    Hyperlipidemia    Hypertension       Objective:     BP 128/83   Pulse 71   Temp 97.7 F (36.5 C) (Oral)   Ht 5' 7 (1.702 m)   Wt 176 lb (79.8 kg)   SpO2 99%   BMI 27.57 kg/m   SpO2: 99 % RA   HEENT : Oropharynx  clear  Nasal turbinates nl    NECK :   without  apparent JVD/ palpable Nodes/TM    LUNGS: no acc muscle use,  Mild barrel  contour chest wall with bilateral  Distant bs s audible wheeze and  without cough on insp or exp maneuvers  and mild  Hyperresonant  to  percussion bilaterally     CV:  RRR  no s3 or murmur or increase in P2, and no edema   ABD:  soft and nontender   MS:  Nl gait/ ext warm without deformities Or obvious joint restrictions  calf tenderness, cyanosis or clubbing     SKIN: warm and dry without lesions    NEURO:  alert, approp, nl sensorium with  no motor or cerebellar deficits apparent.    CXR PA and Lateral:   02/27/2024 :    I personally reviewed images and impression is as follows:       C/w mild copd  no acute findings  Assessment     Assessment & Plan COPD GOLD 2 PFT's  02/15/12   FEV1 1.88 (54 % ) ratio 0.57  p 6 % improvement from saba p ? prior to study with DLCO  17.7 (78%)   and FV curve min concave  and ERV 35%  at wt 148   - quit smoking in 2019   >>>repeat pfts ordered  02/27/2024    - 02/27/2024  After extensive coaching inhaler device,  effectiveness =    60% (very short Ti, poor effort on inspiration corrected)   >>> continue breztri  and more approp saba  Re SABA :  I spent extra time with pt today reviewing appropriate use of albuterol  for prn use on exertion with the following points: 1) saba is for relief of sob that does not improve by walking a slower pace or resting but rather if the pt does not improve after trying this first. 2) If the pt is convinced, as many are, that saba helps recover from activity faster then it's easy to tell if this is the case by re-challenging : ie stop, take the inhaler, then p 5 minutes try the exact same activity (intensity of workload) that just caused the symptoms and see if they are substantially diminished or not after saba 3) if there is an activity that reproducibly causes the symptoms, try the saba 15 min before the activity on alternate  days   If in fact the saba really does help, then fine to continue to use it prn but advised may need to look closer at the maintenance regimen being used to achieve better control of airways disease with exertion.    Former cigarette smoker Quit smoking 2019   Low-dose CT lung cancer screening is recommended for patients who are 40-19 years of age with a  20+ pack-year history of smoking and who are currently smoking or quit <=15 years ago. No coughing up blood  No unintentional weight loss of > 15 pounds in the last 6 months - pt is eligible for scanning yearly until 2034/ advised > referred to LCS program           Each maintenance medication was reviewed in detail including emphasizing most importantly the difference between maintenance and prns and under what circumstances the prns are to be triggered using an action plan format where appropriate.  Total time for H and P, chart review, counseling, reviewing hfa  device(s) and generating customized AVS unique to this office visit / same day charting = 50 min new pt eval          AVS  Patient Instructions  My office will be contacting you by phone for referral to lung cancer screening   [phone]) - if you don't hear back from my office within one week,  please call us  back or notify us  thru MyChart and we'll address it right away.    Also need  PFTs  next available at this office    Plan A = Automatic = Always=    Breztri  Take 2 puffs first thing in am and then another 2 puffs about 12 hours later.    Plan B = Backup (to supplement plan A, not to replace it) Use your albuterol  inhaler as a rescue medication to be used if you can't catch your breath by resting or slowing your pace  or doing a relaxed purse lip breathing pattern.  - The less you use it, the better it will work when you need it. - Ok to use the inhaler up to 2 puffs  every 4 hours if you must but call for appointment if use goes up over your usual need -  Don't leave home without it !!  (think of it like the spare tire or starter fluid for your car)    Also  Ok to try albuterol  15 min before an activity (on alternating days)  that you know would usually make you short of breath and see if it makes any difference and if makes none then don't take albuterol  after activity unless you can't catch your breath as this means it's the resting that helps, not the albuterol .   Please remember to go to the  x-ray department  for your tests - we will call you with the results when they are available    Follow up in this clinic is as needed                     Ozell America, MD 02/27/2024

## 2024-02-27 NOTE — Assessment & Plan Note (Addendum)
 Quit smoking 2019   Low-dose CT lung cancer screening is recommended for patients who are 26-64 years of age with a 20+ pack-year history of smoking and who are currently smoking or quit <=15 years ago. No coughing up blood  No unintentional weight loss of > 15 pounds in the last 6 months - pt is eligible for scanning yearly until 2034/ advised > referred to LCS program           Each maintenance medication was reviewed in detail including emphasizing most importantly the difference between maintenance and prns and under what circumstances the prns are to be triggered using an action plan format where appropriate.  Total time for H and P, chart review, counseling, reviewing hfa  device(s) and generating customized AVS unique to this office visit / same day charting = 50 min new pt eval

## 2024-02-27 NOTE — Addendum Note (Signed)
 Addended by: TRUDY CURVIN RAMAN on: 02/27/2024 02:35 PM   Modules accepted: Orders

## 2024-02-27 NOTE — Patient Instructions (Addendum)
 My office will be contacting you by phone for referral to lung cancer screening   [phone]) - if you don't hear back from my office within one week,  please call us  back or notify us  thru MyChart and we'll address it right away.    Also need  PFTs  next available at this office    Plan A = Automatic = Always=    Breztri  Take 2 puffs first thing in am and then another 2 puffs about 12 hours later.    Plan B = Backup (to supplement plan A, not to replace it) Use your albuterol  inhaler as a rescue medication to be used if you can't catch your breath by resting or slowing your pace  or doing a relaxed purse lip breathing pattern.  - The less you use it, the better it will work when you need it. - Ok to use the inhaler up to 2 puffs  every 4 hours if you must but call for appointment if use goes up over your usual need - Don't leave home without it !!  (think of it like the spare tire or starter fluid for your car)    Also  Ok to try albuterol  15 min before an activity (on alternating days)  that you know would usually make you short of breath and see if it makes any difference and if makes none then don't take albuterol  after activity unless you can't catch your breath as this means it's the resting that helps, not the albuterol .   Please remember to go to the  x-ray department  for your tests - we will call you with the results when they are available    Follow up in this clinic is as needed

## 2024-02-27 NOTE — Assessment & Plan Note (Addendum)
 PFT's  02/15/12   FEV1 1.88 (54 % ) ratio 0.57  p 6 % improvement from saba p ? prior to study with DLCO  17.7 (78%)   and FV curve min concave  and ERV 35%  at wt 148   - quit smoking in 2019   >>>repeat pfts ordered  02/27/2024    - 02/27/2024  After extensive coaching inhaler device,  effectiveness =    60% (very short Ti, poor effort on inspiration corrected)   >>> continue breztri  and more approp saba  Re SABA :  I spent extra time with pt today reviewing appropriate use of albuterol  for prn use on exertion with the following points: 1) saba is for relief of sob that does not improve by walking a slower pace or resting but rather if the pt does not improve after trying this first. 2) If the pt is convinced, as many are, that saba helps recover from activity faster then it's easy to tell if this is the case by re-challenging : ie stop, take the inhaler, then p 5 minutes try the exact same activity (intensity of workload) that just caused the symptoms and see if they are substantially diminished or not after saba 3) if there is an activity that reproducibly causes the symptoms, try the saba 15 min before the activity on alternate days   If in fact the saba really does help, then fine to continue to use it prn but advised may need to look closer at the maintenance regimen being used to achieve better control of airways disease with exertion.

## 2024-02-28 NOTE — Progress Notes (Signed)
 ATC x1. LVMTCB

## 2024-02-29 ENCOUNTER — Telehealth: Payer: Self-pay

## 2024-02-29 NOTE — Addendum Note (Signed)
 Addended by: Airlie Blumenberg D on: 02/29/2024 02:50 PM   Modules accepted: Orders

## 2024-02-29 NOTE — Progress Notes (Signed)
 ATCx2 LVMTCB. Sending MyChart message per protocol as pt has been unable to be reached. NFN

## 2024-02-29 NOTE — Telephone Encounter (Signed)
 Copied from CRM [id number]. Topic: Clinical - Lab/Test Results >> Feb 28, 2024  5:22 PM Rozanna MATSU wrote: Reason for CRM: pt returning call to Meagan attemtped to go over results he stated he saw them on my chart, was just checking to see what the call was pertaining to    [facility] over results with patient NFN

## 2024-03-01 ENCOUNTER — Other Ambulatory Visit

## 2024-03-01 DIAGNOSIS — E559 Vitamin D deficiency, unspecified: Secondary | ICD-10-CM

## 2024-03-01 DIAGNOSIS — E785 Hyperlipidemia, unspecified: Secondary | ICD-10-CM | POA: Diagnosis not present

## 2024-03-01 DIAGNOSIS — R310 Gross hematuria: Secondary | ICD-10-CM

## 2024-03-01 LAB — BASIC METABOLIC PANEL WITH GFR
BUN: 16 mg/dL (ref 6–23)
CO2: 28 meq/L (ref 19–32)
Calcium: 8.7 mg/dL (ref 8.4–10.5)
Chloride: 99 meq/L (ref 96–112)
Creatinine, Ser: 0.94 mg/dL (ref 0.40–1.50)
GFR: 86.04 mL/min (ref >60.00)
Glucose, Bld: 67 mg/dL — ABNORMAL LOW (ref 70–99)
Potassium: 3.3 meq/L — ABNORMAL LOW (ref 3.5–5.1)
Sodium: 138 meq/L (ref 135–145)

## 2024-03-01 LAB — LIPID PANEL
Cholesterol: 212 mg/dL — ABNORMAL HIGH (ref 0–200)
HDL: 118.5 mg/dL (ref >39.00)
LDL Cholesterol: 39 mg/dL (ref 0–99)
NonHDL: 93.53
Total CHOL/HDL Ratio: 2
Triglycerides: 272 mg/dL — ABNORMAL HIGH (ref 0.0–149.0)
VLDL: 54.4 mg/dL — ABNORMAL HIGH (ref 0.0–40.0)

## 2024-03-01 LAB — URINALYSIS, ROUTINE W REFLEX MICROSCOPIC
Bilirubin Urine: NEGATIVE
Hgb urine dipstick: NEGATIVE
Ketones, ur: NEGATIVE
Leukocytes,Ua: NEGATIVE
Nitrite: NEGATIVE
Specific Gravity, Urine: 1.015 (ref 1.000–1.030)
Total Protein, Urine: 30 — AB
Urine Glucose: NEGATIVE
Urobilinogen, UA: 0.2 (ref 0.0–1.0)
pH: 7 (ref 5.0–8.0)

## 2024-03-01 LAB — CBC WITH DIFFERENTIAL/PLATELET
Basophils Absolute: 0 K/uL (ref 0.0–0.1)
Basophils Relative: 0.3 % (ref 0.0–3.0)
Eosinophils Absolute: 0.1 K/uL (ref 0.0–0.7)
Eosinophils Relative: 1.1 % (ref 0.0–5.0)
HCT: 37.6 % — ABNORMAL LOW (ref 39.0–52.0)
Hemoglobin: 12.6 g/dL — ABNORMAL LOW (ref 13.0–17.0)
Lymphocytes Relative: 21 % (ref 12.0–46.0)
Lymphs Abs: 1.8 K/uL (ref 0.7–4.0)
MCHC: 33.4 g/dL (ref 30.0–36.0)
MCV: 97.1 fl (ref 78.0–100.0)
Monocytes Absolute: 1.1 K/uL — ABNORMAL HIGH (ref 0.1–1.0)
Monocytes Relative: 12.8 % — ABNORMAL HIGH (ref 3.0–12.0)
Neutro Abs: 5.6 K/uL (ref 1.4–7.7)
Neutrophils Relative %: 64.8 % (ref 43.0–77.0)
Platelets: 309 K/uL (ref 150.0–400.0)
RBC: 3.87 Mil/uL — ABNORMAL LOW (ref 4.22–5.81)
RDW: 14.9 % (ref 11.5–15.5)
WBC: 8.6 K/uL (ref 4.0–10.5)

## 2024-03-01 LAB — PSA: PSA: 0.53 ng/mL (ref 0.10–4.00)

## 2024-03-01 LAB — VITAMIN D 25 HYDROXY (VIT D DEFICIENCY, FRACTURES): VITD: 27.07 ng/mL — ABNORMAL LOW (ref 30.00–100.00)

## 2024-03-02 ENCOUNTER — Ambulatory Visit: Payer: Self-pay | Admitting: Internal Medicine

## 2024-03-02 LAB — URINE CULTURE
MICRO NUMBER:: 17175440
Result:: NO GROWTH
SPECIMEN QUALITY:: ADEQUATE

## 2024-03-04 ENCOUNTER — Ambulatory Visit: Admitting: Internal Medicine

## 2024-03-04 MED ORDER — POTASSIUM CHLORIDE CRYS ER 10 MEQ PO TBCR: 10.0000 meq | EXTENDED_RELEASE_TABLET | Freq: Every day | ORAL | 0 refills | Status: DC

## 2024-03-04 MED ORDER — VITAMIN D (ERGOCALCIFEROL) 1.25 MG (50000 UNIT) PO CAPS: 50000.0000 [IU] | ORAL_CAPSULE | ORAL | 0 refills | Status: AC

## 2024-03-05 ENCOUNTER — Other Ambulatory Visit (HOSPITAL_BASED_OUTPATIENT_CLINIC_OR_DEPARTMENT_OTHER): Payer: Self-pay

## 2024-03-05 DIAGNOSIS — R0602 Shortness of breath: Secondary | ICD-10-CM

## 2024-03-08 ENCOUNTER — Ambulatory Visit (INDEPENDENT_AMBULATORY_CARE_PROVIDER_SITE_OTHER)

## 2024-03-08 ENCOUNTER — Telehealth: Payer: Self-pay | Admitting: Internal Medicine

## 2024-03-08 DIAGNOSIS — R0602 Shortness of breath: Secondary | ICD-10-CM | POA: Diagnosis not present

## 2024-03-08 LAB — PULMONARY FUNCTION TEST
DL/VA % pred: 83 %
DL/VA: 3.51 ml/min/mmHg/L
DLCO cor % pred: 60 %
DLCO cor: 14.87 ml/min/mmHg
DLCO unc % pred: 56 %
DLCO unc: 13.96 ml/min/mmHg
FEF 25-75 Post: 0.62 L/s
FEF 25-75 Pre: 0.56 L/s
FEF2575-%Change-Post: 10 %
FEF2575-%Pred-Post: 24 %
FEF2575-%Pred-Pre: 22 %
FEV1-%Change-Post: 2 %
FEV1-%Pred-Post: 42 %
FEV1-%Pred-Pre: 41 %
FEV1-Post: 1.34 L
FEV1-Pre: 1.31 L
FEV1FVC-%Change-Post: -1 %
FEV1FVC-%Pred-Pre: 73 %
FEV6-%Change-Post: 5 %
FEV6-%Pred-Post: 60 %
FEV6-%Pred-Pre: 56 %
FEV6-Post: 2.38 L
FEV6-Pre: 2.25 L
FEV6FVC-%Change-Post: 0 %
FEV6FVC-%Pred-Post: 101 %
FEV6FVC-%Pred-Pre: 101 %
FVC-%Change-Post: 4 %
FVC-%Pred-Post: 59 %
FVC-%Pred-Pre: 56 %
FVC-Post: 2.47 L
FVC-Pre: 2.37 L
Post FEV1/FVC ratio: 54 %
Post FEV6/FVC ratio: 96 %
Pre FEV1/FVC ratio: 55 %
Pre FEV6/FVC Ratio: 97 %
RV % pred: 142 %
RV: 3.04 L
TLC % pred: 91 %
TLC: 5.86 L

## 2024-03-08 NOTE — Patient Instructions (Signed)
 Full PFT performed today.

## 2024-03-08 NOTE — Telephone Encounter (Signed)
 Let him knwo cxr was ok

## 2024-03-08 NOTE — Telephone Encounter (Signed)
 Atc x1 lmtcb

## 2024-03-08 NOTE — Progress Notes (Signed)
 Full PFT performed today.

## 2024-03-17 ENCOUNTER — Ambulatory Visit: Payer: Self-pay | Admitting: Internal Medicine

## 2024-03-19 ENCOUNTER — Telehealth: Payer: Self-pay

## 2024-03-19 NOTE — Progress Notes (Signed)
 ATCx2 LVMTCB. Sending MyChart message per protocol as pt has been unable to be reached. Copied to PCP. NFN.

## 2024-03-19 NOTE — Telephone Encounter (Signed)
 Copied from CRM [id number]. Topic: Appointments - Transfer of Care >> Mar 19, 2024 12:47 PM Celestine FALCON wrote: Pt is requesting to transfer FROM: Dr. Darlean Pt is requesting to transfer TO: Dr. Pleas Reason for requested transfer: Pt wanted a pulm provider with more flexibility at the [facility] clinic on Fridays specifically as that is the only day the pt can make appts on. It is the responsibility of the team the patient would like to transfer to (Dr. Pleas) to reach out to the patient if for any reason this transfer is not acceptable.

## 2024-03-19 NOTE — Telephone Encounter (Signed)
 Copied from CRM [id number]. Topic: Clinical - Lab/Test Results >> Mar 19, 2024  2:43 PM Rozanna MATSU wrote: Reason for CRM: pt returning call about PFT results advsied him of the  message from Meagan and he will check his my chart  NFN

## 2024-03-22 ENCOUNTER — Encounter: Payer: Self-pay | Admitting: Urology

## 2024-03-22 ENCOUNTER — Ambulatory Visit: Admitting: Urology

## 2024-03-22 ENCOUNTER — Encounter

## 2024-03-22 ENCOUNTER — Ambulatory Visit (INDEPENDENT_AMBULATORY_CARE_PROVIDER_SITE_OTHER)

## 2024-03-22 VITALS — BP 126/68 | HR 84 | Temp 98.4°F | Ht 68.0 in | Wt 173.0 lb

## 2024-03-22 VITALS — BP 137/71 | HR 97 | Ht 68.0 in | Wt 173.0 lb

## 2024-03-22 DIAGNOSIS — R310 Gross hematuria: Secondary | ICD-10-CM | POA: Diagnosis not present

## 2024-03-22 DIAGNOSIS — J439 Emphysema, unspecified: Secondary | ICD-10-CM | POA: Diagnosis not present

## 2024-03-22 DIAGNOSIS — Z122 Encounter for screening for malignant neoplasm of respiratory organs: Secondary | ICD-10-CM

## 2024-03-22 DIAGNOSIS — R0602 Shortness of breath: Secondary | ICD-10-CM | POA: Diagnosis not present

## 2024-03-22 DIAGNOSIS — I2510 Atherosclerotic heart disease of native coronary artery without angina pectoris: Secondary | ICD-10-CM

## 2024-03-22 DIAGNOSIS — Z87891 Personal history of nicotine dependence: Secondary | ICD-10-CM | POA: Diagnosis not present

## 2024-03-22 MED ORDER — ROFLUMILAST 500 MCG PO TABS: 500.0000 ug | ORAL_TABLET | Freq: Every day | ORAL | 5 refills | Status: AC

## 2024-03-22 MED ORDER — ROFLUMILAST 250 MCG PO TABS
250.0000 ug | ORAL_TABLET | Freq: Every day | ORAL | 0 refills | Status: AC
Start: 2024-03-22 — End: ?

## 2024-03-22 MED ORDER — ALBUTEROL SULFATE HFA 108 (90 BASE) MCG/ACT IN AERS: 2.0000 | INHALATION_SPRAY | Freq: Four times a day (QID) | RESPIRATORY_TRACT | 5 refills | Status: AC | PRN

## 2024-03-22 MED ORDER — IPRATROPIUM-ALBUTEROL 0.5-2.5 (3) MG/3ML IN SOLN: 3.0000 mL | Freq: Four times a day (QID) | RESPIRATORY_TRACT | 1 refills | Status: AC | PRN

## 2024-03-22 MED ORDER — BREZTRI AEROSPHERE 160-9-4.8 MCG/ACT IN AERO: 2.0000 | INHALATION_SPRAY | Freq: Two times a day (BID) | RESPIRATORY_TRACT | 5 refills | Status: AC

## 2024-03-22 NOTE — Addendum Note (Signed)
 Addended by: OBADIAH ROSELEE RAMAN on: 03/22/2024 11:57 AM   Modules accepted: Orders

## 2024-03-22 NOTE — Progress Notes (Deleted)
 New Patient Pulmonology Office Visit   Subjective:  Patient ID: Richard Gates, male    DOB: December 15, 1960  MRN: [id number]  Referred by: Amon Aloysius BRAVO, MD  CC:  Chief Complaint  Patient presents with   Shortness of Breath    SOB with exertion persistent.    HPI Richard Gates is a 63 y.o. male with ***  Discussed the use of AI scribe software for clinical note transcription with the patient, who gave verbal consent to proceed.  History of Present Illness      {Richard Gates (Optional):33196}  ROS  Allergies: Patient has no known allergies.  Current Outpatient Medications:    albuterol  (VENTOLIN  HFA) 108 (90 Base) MCG/ACT inhaler, Inhale 2 puffs into the lungs every 6 (six) hours as needed for wheezing or shortness of breath., Disp: 18 g, Rfl: 5   allopurinol  (ZYLOPRIM ) 300 MG tablet, Take 1 tablet (300 mg total) by mouth daily., Disp: 90 tablet, Rfl: 1   amLODipine  (NORVASC ) 5 MG tablet, Take 1 tablet (5 mg total) by mouth daily., Disp: 90 tablet, Rfl: 1   bisoprolol  (ZEBETA ) 5 MG tablet, Take 1 tablet (5 mg total) by mouth daily., Disp: 30 tablet, Rfl: 6   budesonide -glycopyrrolate-formoterol  (BREZTRI  AEROSPHERE) 160-9-4.8 MCG/ACT AERO inhaler, Inhale 2 puffs into the lungs in the morning and at bedtime., Disp: , Rfl:    cholecalciferol  (VITAMIN D3) 25 MCG (1000 UNIT) tablet, Take 2,000 Units by mouth daily., Disp: , Rfl:    Coenzyme Q10 (COQ-10) 200 MG CAPS, Take 200 mg by mouth daily., Disp: , Rfl:    colchicine  0.6 MG tablet, Take 1 tablet (0.6 mg total) by mouth 2 (two) times daily as needed (gout flare)., Disp: 60 tablet, Rfl: 1   cyanocobalamin  (VITAMIN B12) 1000 MCG tablet, Take 1,000 mcg by mouth daily., Disp: , Rfl:    potassium chloride  (KLOR-CON  M) 10 MEQ tablet, Take 1 tablet (10 mEq total) by mouth daily., Disp: 10 tablet, Rfl: 0   rosuvastatin  (CRESTOR ) 20 MG tablet, Take 1 tablet (20 mg total) by mouth at bedtime., Disp: 90 tablet, Rfl: 1   Vitamin D ,  Ergocalciferol , (DRISDOL ) 1.25 MG (50000 UNIT) CAPS capsule, Take 1 capsule (50,000 Units total) by mouth every 7 (seven) days., Disp: 12 capsule, Rfl: 0 Past Medical History:  Diagnosis Date   COPD (chronic obstructive pulmonary disease) (HCC)    Coronary artery calcification seen on CAT scan 08/2019   Coronary calcium  score 103; short LM with<25% mixed calcific plaque (minimal); aortic atherosclerosis with normal size.  No dissection.  No aortic valve calcification   GERD (gastroesophageal reflux disease)    Hyperlipidemia    Hypertension    Past Surgical History:  Procedure Laterality Date   APPENDECTOMY     BIOPSY  10/02/2021   Procedure: BIOPSY;  Surgeon: Albertus Gordy HERO, MD;  Location: [facility] ENDOSCOPY;  Service: Gastroenterology;;   COLONOSCOPY  08/24/2016   COLONOSCOPY WITH PROPOFOL  N/A 10/02/2021   Procedure: COLONOSCOPY WITH PROPOFOL ;  Surgeon: Albertus Gordy HERO, MD;  Location: [facility] ENDOSCOPY;  Service: Gastroenterology;  Laterality: N/A;   ESOPHAGOGASTRODUODENOSCOPY (EGD) WITH PROPOFOL  N/A 10/02/2021   Procedure: ESOPHAGOGASTRODUODENOSCOPY (EGD) WITH PROPOFOL ;  Surgeon: Albertus Gordy HERO, MD;  Location: [facility] ENDOSCOPY;  Service: Gastroenterology;  Laterality: N/A;   POLYPECTOMY     TRANSTHORACIC ECHOCARDIOGRAM  04/2018   EF 65-70% (vigorous). No RWMA.  Gr 1 DD.  Normal valves.  (In setting of COPD exacerbation)   Family History  Problem Relation Age of  Onset   Stroke Father        July 2019: complicated by hemmrhage -- lost eyesight, memory loss   Hypertension Other    Heart disease Paternal Uncle    Breast cancer Sister    Heart disease Paternal Aunt        surgery   Heart attack Maternal Uncle    Colon cancer Neg Hx    Prostate cancer Neg Hx    Colon polyps Neg Hx    Esophageal cancer Neg Hx    Stomach cancer Neg Hx    Rectal cancer Neg Hx    Social History   Socioeconomic History   Marital status: Married    Spouse name: Not on file   Number of children: 1   Years of education:  Not on file   Highest education level: GED or equivalent  Occupational History   Occupation: works at [employer] in a local company  Tobacco Use   Smoking status: Former    Current packs/day: 0.00    Average packs/day: 0.8 packs/day for 41.0 years (30.8 ttl pk-yrs)    Types: Cigarettes    Start date: 59    Quit date: 2020    Years since quitting: 5.8   Smokeless tobacco: Never   Tobacco comments:    Quit tobacco 2020  Vaping Use   Vaping status: Never Used  Substance and Sexual Activity   Alcohol use: Yes    Comment: drinks [employer] Lemonade, week ends   Drug use: Not Currently    Types: Marijuana    Comment: last use in maybe 2018   Sexual activity: Not on file  Other Topics Concern   Not on file  Social History Narrative   Household- pt and wife   1 daughter    4 g-children   2 g-g child   Social Drivers of Corporate Investment Banker Strain: Medium Risk (02/19/2024)   Overall Financial Resource Strain (CARDIA)    Difficulty of Paying Living Expenses: Somewhat hard  Food Insecurity: Food Insecurity Present (02/19/2024)   Hunger Vital Sign    Worried About Running Out of Food in the Last Year: Sometimes true    Ran Out of Food in the Last Year: Never true  Transportation Needs: No Transportation Needs (02/19/2024)   PRAPARE - Administrator, Civil Service (Medical): No    Lack of Transportation (Non-Medical): No  Physical Activity: Inactive (02/19/2024)   Exercise Vital Sign    Days of Exercise per Week: 0 days    Minutes of Exercise per Session: Not on file  Stress: Stress Concern Present (02/19/2024)   [employer] of Occupational Health - Occupational Stress Questionnaire    Feeling of Stress: Rather much  Social Connections: Moderately Integrated (02/19/2024)   Social Connection and Isolation Panel    Frequency of Communication with Friends and Family: Three times a week    Frequency of Social Gatherings with Friends and Family: Once a  week    Attends Religious Services: More than 4 times per year    Active Member of [employer] or Organizations: No    Attends Engineer, Structural: Not on file    Marital Status: Married  Catering Manager Violence: Not on file         Objective:  BP 126/68   Pulse 84   Temp 98.4 F (36.9 C) (Oral)   Ht 5' 8 (1.727 m)   Wt 173 lb (78.5 kg)   SpO2  93% Comment: room air  BMI 26.30 kg/m  {Richard Vitals (Optional):32837}  Physical Exam  Diagnostic Review:  {Labs (Optional):32838}  Pft    Latest Ref Rng & Units 03/08/2024   10:47 AM  PFT Results  FVC-Pre L 2.37   FVC-Predicted Pre % 56   FVC-Post L 2.47   FVC-Predicted Post % 59   Pre FEV1/FVC % % 55   Post FEV1/FCV % % 54   FEV1-Pre L 1.31   FEV1-Predicted Pre % 41   FEV1-Post L 1.34   DLCO uncorrected ml/min/mmHg 13.96   DLCO UNC% % 56   DLCO corrected ml/min/mmHg 14.87   DLCO COR %Predicted % 60   DLVA Predicted % 83   TLC L 5.86   TLC % Predicted % 91   RV % Predicted % 142          Results       Assessment & Plan:   Assessment & Plan Chronic obstructive pulmonary disease with emphysema, unspecified emphysema type (HCC)     Former smoker     Screening for lung cancer     Coronary artery calcification       Thank you for the opportunity to take part in the care of HEWITT GARNER   No follow-ups on file.   Samba Cumba Pleas, MD [facility] Office: [phone]

## 2024-03-22 NOTE — Patient Instructions (Addendum)
 It was a pleasure to see you today. Your will receive a call for your CT chest scheduling. Cardiology referral has been placed, call us  if you dont get an appointment call in 2-3 weeks Start daliresp  250mcg daily for 4 weeks, after that take 500mcg daily Stop this medication or alert us  if you notice mood changes, depression or suicidal thoughts. It may also cause GI symptoms and weight loss. Continue breztri  2 puff BID Take albuterol  inhaler and nebulizer as needed

## 2024-03-22 NOTE — Progress Notes (Signed)
 New Patient Pulmonology Office Visit   Subjective:  Patient ID: Richard Gates, male    DOB: 1960/12/02  MRN: [id number]  Referred by: Amon Aloysius BRAVO, MD  CC:  Chief Complaint  Patient presents with   Shortness of Breath    SOB with exertion persistent.    Shortness of Breath   Richard Gates is a 63 y.o. male who is transferring care to me from Dr Darlean due to availabilities only on Fridays.  Discussed the use of AI scribe software for clinical note transcription with the patient, who gave verbal consent to proceed.  History of Present Illness Richard Gates is a 63 year old male with COPD who presents with worsening shortness of breath.  He experiences worsening shortness of breath, particularly during physical exertion such as moving materials at work or walking long distances. He has to stop and rest to catch his breath after such activities. He describes his breathing as feeling 'tight' and has noticed a progressive decline in his ability to perform physical tasks without becoming breathless.  He has a history of COPD and recently underwent a breathing test about a week ago. It shows severe obstruction and reduced DLCO. He quit smoking in 2015, he thinks. He has been using Breztri  inhaler, two puffs in the morning and two at night, for about two years, which has helped reduce his shortness of breath compared to before he started the medication. He does not have a nebulizer at home but has used his wife's nebulizer in the past. He does not frequently use albuterol  and does not experience frequent bronchitis.  His past medical history includes a CT scan in January 2024, which showed emphysema and stable nodules. He is due for another CT scan as part of routine cancer screening for former smokers. He is unsure if he has had his heart checked before.  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 He lives with his wife in [place].   Richard Gates is a 63 year old male with COPD who presents with worsening shortness of breath.  He experiences worsening shortness of breath, particularly during physical exertion such as moving materials at work or walking long distances. He has to stop and rest to catch his breath after such activities. He describes his breathing as feeling 'tight' and has noticed a progressive decline in his ability to perform physical tasks without becoming breathless.  He has a history of COPD and recently underwent a breathing test about a week ago. He quit smoking in 2015. He has been using Breztri  inhaler, two puffs in the morning and two at night, for about two years, which has helped reduce his shortness of breath compared to before he started the medication. He does not have a nebulizer at home but has used his wife's nebulizer in the past. He does not frequently use albuterol  and does not experience frequent bronchitis or require antibiotics often.  His past medical history includes a CT scan in January 2024, which showed emphysema and calcification in the heart vessels. He is due for another CT scan as part of routine cancer screening for former smokers. He is unsure if he has had his heart checked before.  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 He  lives with his wife in [place].     Review of Systems  Respiratory:  Positive for shortness of breath.    Review of symptoms negative except mentioned above   Allergies: Patient has no known allergies.  Current Outpatient Medications:    allopurinol  (ZYLOPRIM ) 300 MG tablet, Take 1 tablet (300 mg total) by mouth daily., Disp: 90 tablet, Rfl: 1   amLODipine  (NORVASC ) 5 MG tablet, Take 1 tablet (5 mg total) by mouth daily., Disp: 90 tablet, Rfl: 1   bisoprolol  (ZEBETA ) 5 MG tablet, Take 1  tablet (5 mg total) by mouth daily., Disp: 30 tablet, Rfl: 6   cholecalciferol  (VITAMIN D3) 25 MCG (1000 UNIT) tablet, Take 2,000 Units by mouth daily., Disp: , Rfl:    Coenzyme Q10 (COQ-10) 200 MG CAPS, Take 200 mg by mouth daily., Disp: , Rfl:    colchicine  0.6 MG tablet, Take 1 tablet (0.6 mg total) by mouth 2 (two) times daily as needed (gout flare)., Disp: 60 tablet, Rfl: 1   cyanocobalamin  (VITAMIN B12) 1000 MCG tablet, Take 1,000 mcg by mouth daily., Disp: , Rfl:    ipratropium-albuterol  (DUONEB) 0.5-2.5 (3) MG/3ML SOLN, Take 3 mLs by nebulization every 6 (six) hours as needed., Disp: 360 mL, Rfl: 1   potassium chloride  (KLOR-CON  M) 10 MEQ tablet, Take 1 tablet (10 mEq total) by mouth daily., Disp: 10 tablet, Rfl: 0   Roflumilast  (DALIRESP ) 250 MCG TABS, Take 250 mcg by mouth daily. Take this for first 4 weeks, then take the 500mcg tablets, Disp: 84 tablet, Rfl: 0   roflumilast  (DALIRESP ) 500 MCG TABS tablet, Take 1 tablet (500 mcg total) by mouth daily. Take 250mcg tablets the first 4 weeks, Disp: 90 tablet, Rfl: 5   rosuvastatin  (CRESTOR ) 20 MG tablet, Take 1 tablet (20 mg total) by mouth at bedtime., Disp: 90 tablet, Rfl: 1   Vitamin D , Ergocalciferol , (DRISDOL ) 1.25 MG (50000 UNIT) CAPS capsule, Take 1 capsule (50,000 Units total) by mouth every 7 (seven) days., Disp: 12 capsule, Rfl: 0   albuterol  (VENTOLIN  HFA) 108 (90 Base) MCG/ACT inhaler, Inhale 2 puffs into the lungs every 6 (six) hours as needed for wheezing or shortness of breath., Disp: 18 g, Rfl: 5   budesonide -glycopyrrolate-formoterol  (BREZTRI  AEROSPHERE) 160-9-4.8 MCG/ACT AERO inhaler, Inhale 2 puffs into the lungs in the morning and at bedtime., Disp: 10.7 g, Rfl: 5 Past Medical History:  Diagnosis Date   COPD (chronic obstructive pulmonary disease) (HCC)    Coronary artery calcification seen on CAT scan 08/2019   Coronary calcium  score 103; short LM with<25% mixed calcific plaque (minimal); aortic atherosclerosis with  normal size.  No dissection.  No aortic valve calcification   GERD (gastroesophageal reflux disease)    Hyperlipidemia    Hypertension    Past Surgical History:  Procedure Laterality Date   APPENDECTOMY     BIOPSY  10/02/2021   Procedure: BIOPSY;  Surgeon: Albertus Gordy HERO, MD;  Location: [facility] ENDOSCOPY;  Service: Gastroenterology;;   COLONOSCOPY  08/24/2016   COLONOSCOPY WITH PROPOFOL  N/A 10/02/2021   Procedure: COLONOSCOPY WITH PROPOFOL ;  Surgeon: Albertus Gordy HERO, MD;  Location: [facility] ENDOSCOPY;  Service: Gastroenterology;  Laterality: N/A;   ESOPHAGOGASTRODUODENOSCOPY (EGD) WITH PROPOFOL  N/A 10/02/2021   Procedure: ESOPHAGOGASTRODUODENOSCOPY (EGD) WITH PROPOFOL ;  Surgeon: Albertus Gordy HERO, MD;  Location: [facility] ENDOSCOPY;  Service: Gastroenterology;  Laterality: N/A;   POLYPECTOMY     TRANSTHORACIC ECHOCARDIOGRAM  04/2018   EF 65-70% (vigorous). No RWMA.  Gr 1 DD.  Normal valves.  (  In setting of COPD exacerbation)   Family History  Problem Relation Age of Onset   Stroke Father        July 2019: complicated by hemmrhage -- lost eyesight, memory loss   Hypertension Other    Heart disease Paternal Uncle    Breast cancer Sister    Heart disease Paternal Aunt        surgery   Heart attack Maternal Uncle    Colon cancer Neg Hx    Prostate cancer Neg Hx    Colon polyps Neg Hx    Esophageal cancer Neg Hx    Stomach cancer Neg Hx    Rectal cancer Neg Hx    Social History   Socioeconomic History   Marital status: Married    Spouse name: Not on file   Number of children: 1   Years of education: Not on file   Highest education level: GED or equivalent  Occupational History   Occupation: works at [employer] in a local company  Tobacco Use   Smoking status: Former    Current packs/day: 0.00    Average packs/day: 0.8 packs/day for 41.0 years (30.8 ttl pk-yrs)    Types: Cigarettes    Start date: 41    Quit date: 2020    Years since quitting: 5.8   Smokeless tobacco: Never   Tobacco comments:     Quit tobacco 2020  Vaping Use   Vaping status: Never Used  Substance and Sexual Activity   Alcohol use: Yes    Comment: drinks [employer] Lemonade, week ends   Drug use: Not Currently    Types: Marijuana    Comment: last use in maybe 2018   Sexual activity: Not on file  Other Topics Concern   Not on file  Social History Narrative   Household- pt and wife   1 daughter    4 g-children   2 g-g child   Social Drivers of Corporate Investment Banker Strain: Medium Risk (02/19/2024)   Overall Financial Resource Strain (CARDIA)    Difficulty of Paying Living Expenses: Somewhat hard  Food Insecurity: Food Insecurity Present (02/19/2024)   Hunger Vital Sign    Worried About Running Out of Food in the Last Year: Sometimes true    Ran Out of Food in the Last Year: Never true  Transportation Needs: No Transportation Needs (02/19/2024)   PRAPARE - Administrator, Civil Service (Medical): No    Lack of Transportation (Non-Medical): No  Physical Activity: Inactive (02/19/2024)   Exercise Vital Sign    Days of Exercise per Week: 0 days    Minutes of Exercise per Session: Not on file  Stress: Stress Concern Present (02/19/2024)   [employer] of Occupational Health - Occupational Stress Questionnaire    Feeling of Stress: Rather much  Social Connections: Moderately Integrated (02/19/2024)   Social Connection and Isolation Panel    Frequency of Communication with Friends and Family: Three times a week    Frequency of Social Gatherings with Friends and Family: Once a week    Attends Religious Services: More than 4 times per year    Active Member of [employer] or Organizations: No    Attends Engineer, Structural: Not on file    Marital Status: Married  Catering Manager Violence: Not on file         Objective:  BP 126/68   Pulse 84   Temp 98.4 F (36.9 C) (Oral)   Ht 5'  8 (1.727 m)   Wt 173 lb (78.5 kg)   SpO2 93% Comment: room air  BMI 26.30 kg/m     Physical Exam Constitutional:      General: He is not in acute distress.    Appearance: Normal appearance.  HENT:     Mouth/Throat:     Mouth: Mucous membranes are moist.  Cardiovascular:     Rate and Rhythm: Normal rate.  Pulmonary:     Effort: No respiratory distress.     Breath sounds: No wheezing or rales.  Musculoskeletal:     Right lower leg: No edema.     Left lower leg: No edema.  Skin:    General: Skin is warm.  Neurological:     Mental Status: He is alert and oriented to person, place, and time.  Psychiatric:        Mood and Affect: Mood normal.     Diagnostic Review:    Pft    Latest Ref Rng & Units 03/08/2024   10:47 AM  PFT Results  FVC-Pre L 2.37   FVC-Predicted Pre % 56   FVC-Post L 2.47   FVC-Predicted Post % 59   Pre FEV1/FVC % % 55   Post FEV1/FCV % % 54   FEV1-Pre L 1.31   FEV1-Predicted Pre % 41   FEV1-Post L 1.34   DLCO uncorrected ml/min/mmHg 13.96   DLCO UNC% % 56   DLCO corrected ml/min/mmHg 14.87   DLCO COR %Predicted % 60   DLVA Predicted % 83   TLC L 5.86   TLC % Predicted % 91   RV % Predicted % 142          Results RADIOLOGY Chest CT: Emphysema in upper lobes, coronary artery calcification (05/2022) Chest X-ray: Clear lungs, no concerning lesions (02/27/2024)  DIAGNOSTIC Pulmonary function test: Severe obstruction consistent with COPD, reduced DLCO   RADIOLOGY Chest CT: Emphysema in upper lobes, coronary artery calcification (05/2022) Chest X-ray: Clear lungs, no concerning lesions (02/27/2024)  DIAGNOSTIC Pulmonary function test: Severe obstruction consistent with COPD      Assessment & Plan:   Assessment & Plan Chronic obstructive pulmonary disease with emphysema, unspecified emphysema type (HCC) Uncontrolled symptoms despite being on triple inhaler Will add duonebs. Pt will clean and use his wife's nebulizer- she wont be using it anymore Advised him to use albuterol  before exertion Discussed inhaled  phosphodiesterase inhibitor- ensifentrine . However due to potential high cost he prefers to use daliresp  Discussed mood changes, suicidal ideation, gi A/E and weight loss with the patient He will take daliresp  250mcg daily for first 4 weeks, then increase to 500mcg daily   Continue breztri  2 puff BID Take albuterol  inhaler and nebulizer as needed  Duonebs ordered  Former smoker Congratulated on quitting smoking     Screening for lung cancer I discussed US  Biochemist, Clinical for annual low-dose CT scan screening for high-risk individuals (ages 73 to 35 years with a 20 pack-year history of smoking and current smoker or quit within past 15 years).  I made them aware that if abnormal CT chest finding may lead to further diagnostic work-up including but not limited to regular follow-up CT chest, lung biopsies with their inherent risks for pneumothorax and bleeding and personal stress from finding out about lung nodule/other abnormalities.  The idea is to help diagnose lung malignancy at an early stage and this has shown to lower the number of lung cancer related deaths.  We also discussed that screening  CT chest results may warrant close follow-up and interventions on nodules that may eventually turn out to be nonmalignant. Pt/family understand the risk and would like to proceed with lung cancer screening.   [place]    SOB (shortness of breath) Significant dyspnea despite triple inhaler, denies frequent bronchitis flare ups Will refer to cardiology, coronary calcifications in CT chest      Thank you for the opportunity to take part in the care of Richard Gates   Return in about 3 months (around 06/22/2024).   Chelly Dombeck Pleas, MD [facility] Office: [phone]

## 2024-03-22 NOTE — Progress Notes (Signed)
 Assessment: 1. Gross hematuria     Plan: I personally reviewed the patient's chart including provider notes, lab results. Today I had a discussion with the patient regarding the findings of gross hematuria including the implications and differential diagnoses associated with it.  I also discussed recommendations for further evaluation including the rationale for upper tract imaging and cystoscopy.  I discussed the nature of these procedures including potential risk and complications.  The patient expressed an understanding of these issues. Scheduled for CT hematuria study followed by cystoscopy.  Chief Complaint:  Chief Complaint  Patient presents with   Hematuria    History of Present Illness:  Richard Gates is a 63 y.o. male who is seen in consultation from Amon Aloysius BRAVO, MD for evaluation of gross hematuria. He noted pink-colored urine on 2 occasions in October 2025.  No associated dysuria or flank pain. No history of UTIs or kidney stones. He has a history of tobacco use smoking x 40 years.  He quit approximately 10 years ago.  U/A 03/01/24:  0-2 RBC Urine culture 03/01/24:  no growth PSA 03/01/24:  0.53 Cr 03/01/24:  0.94  He currently reports some nocturia 3-4 times.  No dysuria or gross hematuria.  Past Medical History:  Past Medical History:  Diagnosis Date   COPD (chronic obstructive pulmonary disease) (HCC)    Coronary artery calcification seen on CAT scan 08/2019   Coronary calcium  score 103; short LM with<25% mixed calcific plaque (minimal); aortic atherosclerosis with normal size.  No dissection.  No aortic valve calcification   GERD (gastroesophageal reflux disease)    Hyperlipidemia    Hypertension     Past Surgical History:  Past Surgical History:  Procedure Laterality Date   APPENDECTOMY     BIOPSY  10/02/2021   Procedure: BIOPSY;  Surgeon: Albertus Gordy HERO, MD;  Location: [facility] ENDOSCOPY;  Service: Gastroenterology;;   COLONOSCOPY  08/24/2016   COLONOSCOPY  WITH PROPOFOL  N/A 10/02/2021   Procedure: COLONOSCOPY WITH PROPOFOL ;  Surgeon: Albertus Gordy HERO, MD;  Location: [facility] ENDOSCOPY;  Service: Gastroenterology;  Laterality: N/A;   ESOPHAGOGASTRODUODENOSCOPY (EGD) WITH PROPOFOL  N/A 10/02/2021   Procedure: ESOPHAGOGASTRODUODENOSCOPY (EGD) WITH PROPOFOL ;  Surgeon: Albertus Gordy HERO, MD;  Location: [facility] ENDOSCOPY;  Service: Gastroenterology;  Laterality: N/A;   POLYPECTOMY     TRANSTHORACIC ECHOCARDIOGRAM  04/2018   EF 65-70% (vigorous). No RWMA.  Gr 1 DD.  Normal valves.  (In setting of COPD exacerbation)    Allergies:  No Known Allergies  Family History:  Family History  Problem Relation Age of Onset   Stroke Father        July 2019: complicated by hemmrhage -- lost eyesight, memory loss   Hypertension Other    Heart disease Paternal Uncle    Breast cancer Sister    Heart disease Paternal Aunt        surgery   Heart attack Maternal Uncle    Colon cancer Neg Hx    Prostate cancer Neg Hx    Colon polyps Neg Hx    Esophageal cancer Neg Hx    Stomach cancer Neg Hx    Rectal cancer Neg Hx     Social History:  Social History   Tobacco Use   Smoking status: Former    Current packs/day: 0.00    Average packs/day: 0.8 packs/day for 41.0 years (30.8 ttl pk-yrs)    Types: Cigarettes    Start date: 85    Quit date: 2020    Years since  quitting: 5.8   Smokeless tobacco: Never   Tobacco comments:    Quit tobacco 2020  Vaping Use   Vaping status: Never Used  Substance Use Topics   Alcohol use: Yes    Comment: drinks Mike's Lemonade, week ends   Drug use: Not Currently    Types: Marijuana    Comment: last use in maybe 2018    Review of symptoms:  Constitutional:  Negative for unexplained weight loss, night sweats, fever, chills ENT:  Negative for nose bleeds, sinus pain, painful swallowing CV:  Negative for chest pain, shortness of breath, exercise intolerance, palpitations, loss of consciousness Resp:  Negative for cough, wheezing, shortness  of breath GI:  Negative for nausea, vomiting, diarrhea, bloody stools GU:  Positives noted in HPI; otherwise negative for dysuria, urinary incontinence Neuro:  Negative for seizures, poor balance, limb weakness, slurred speech Psych:  Negative for lack of energy, depression, anxiety Endocrine:  Negative for polydipsia, polyuria, symptoms of hypoglycemia (dizziness, hunger, sweating) Hematologic:  Negative for anemia, purpura, petechia, prolonged or excessive bleeding, use of anticoagulants  Allergic:  Negative for difficulty breathing or choking as a result of exposure to anything; no shellfish allergy; no allergic response (rash/itch) to materials, foods  Physical exam: BP 137/71   Pulse 97   Ht 5' 8 (1.727 m)   Wt 173 lb (78.5 kg)   BMI 26.30 kg/m  GENERAL APPEARANCE:  Well appearing, well developed, well nourished, NAD HEENT: Atraumatic, Normocephalic, oropharynx clear. NECK: Supple without lymphadenopathy or thyromegaly. LUNGS: Clear to auscultation bilaterally. HEART: Regular Rate and Rhythm without murmurs, gallops, or rubs. ABDOMEN: Soft, non-tender, No Masses. EXTREMITIES: Moves all extremities well.  Without clubbing, cyanosis, or edema. NEUROLOGIC:  Alert and oriented x 3, normal gait, CN II-XII grossly intact.  MENTAL STATUS:  Appropriate. BACK:  Non-tender to palpation.  No CVAT SKIN:  Warm, dry and intact.   GU: Penis:  uncircumcised Meatus: Normal Scrotum: normal, no masses Testis: normal without masses bilateral   Results: No specimen provided

## 2024-03-22 NOTE — Progress Notes (Deleted)
 Richard Gates, male    DOB: April 29, 1961   MRN: [id number]   Brief patient profile:  63   yobm quit smoking 2019  referred to pulmonary clinic 03/22/2024 by Dr Richard for copd eval       Pt not previously seen by [facility] service.    History of Present Illness  03/22/2024  Pulmonary/ 1st office eval/Wert breztri   5 am / 5pm  Chief Complaint  Patient presents with   Shortness of Breath    SOB with exertion persistent.  Dyspnea:  slow pace can go anywhere/ struggles a bit with inclines or carrying heavy objects like furniture o Cough: no am flares/ sporadic pattern non productive  Sleep: flat bed / one pillow s resp cc  SABA use: around 9 pm  02 ldz:wnwz  LDSCT:order   No obvious day to day or daytime pattern/variability or assoc excess/ purulent sputum or mucus plugs or hemoptysis or cp or chest tightness, subjective wheeze or overt sinus or hb symptoms.    Also denies any obvious fluctuation of symptoms with weather or environmental changes or other aggravating or alleviating factors except as outlined above   No unusual exposure hx or h/o childhood pna/ asthma or knowledge of premature birth.  Current Allergies, Complete Past Medical History, Past Surgical History, Family History, and Social History were reviewed in [employer] record.  ROS  The following are not active complaints unless bolded Hoarseness, sore throat, dysphagia, dental problems, itching, sneezing,  nasal congestion or discharge of excess mucus or purulent secretions, ear ache,   fever, chills, sweats, unintended wt loss or wt gain, classically pleuritic or exertional cp,  orthopnea pnd or arm/hand swelling  or leg swelling, presyncope, palpitations, abdominal pain, anorexia, nausea, vomiting, diarrhea  or change in bowel habits or change in bladder habits, change in stools or change in urine, dysuria, hematuria,  rash, arthralgias, visual complaints, headache, numbness, weakness or ataxia or  problems with walking or coordination,  change in mood or  memory.             Outpatient Medications Prior to Visit  Medication Sig Dispense Refill   allopurinol  (ZYLOPRIM ) 300 MG tablet Take 1 tablet (300 mg total) by mouth daily. 90 tablet 1   amLODipine  (NORVASC ) 5 MG tablet Take 1 tablet (5 mg total) by mouth daily. 90 tablet 1   bisoprolol  (ZEBETA ) 5 MG tablet Take 1 tablet (5 mg total) by mouth daily. 30 tablet 6   cholecalciferol  (VITAMIN D3) 25 MCG (1000 UNIT) tablet Take 2,000 Units by mouth daily.     Coenzyme Q10 (COQ-10) 200 MG CAPS Take 200 mg by mouth daily.     colchicine  0.6 MG tablet Take 1 tablet (0.6 mg total) by mouth 2 (two) times daily as needed (gout flare). 60 tablet 1   cyanocobalamin  (VITAMIN B12) 1000 MCG tablet Take 1,000 mcg by mouth daily.     potassium chloride  (KLOR-CON  M) 10 MEQ tablet Take 1 tablet (10 mEq total) by mouth daily. 10 tablet 0   rosuvastatin  (CRESTOR ) 20 MG tablet Take 1 tablet (20 mg total) by mouth at bedtime. 90 tablet 1   Vitamin D , Ergocalciferol , (DRISDOL ) 1.25 MG (50000 UNIT) CAPS capsule Take 1 capsule (50,000 Units total) by mouth every 7 (seven) days. 12 capsule 0   albuterol  (VENTOLIN  HFA) 108 (90 Base) MCG/ACT inhaler Inhale 2 puffs into the lungs every 6 (six) hours as needed for wheezing or shortness of breath. 18 g 5  budesonide -glycopyrrolate-formoterol  (BREZTRI  AEROSPHERE) 160-9-4.8 MCG/ACT AERO inhaler Inhale 2 puffs into the lungs in the morning and at bedtime.     budesonide -glycopyrrolate-formoterol  (BREZTRI  AEROSPHERE) 160-9-4.8 MCG/ACT AERO inhaler Inhale 2 puffs into the lungs 2 (two) times daily. 10.7 g 5   No facility-administered medications prior to visit.    Past Medical History:  Diagnosis Date   COPD (chronic obstructive pulmonary disease) (HCC)    Coronary artery calcification seen on CAT scan 08/2019   Coronary calcium  score 103; short LM with<25% mixed calcific plaque (minimal); aortic atherosclerosis  with normal size.  No dissection.  No aortic valve calcification   GERD (gastroesophageal reflux disease)    Hyperlipidemia    Hypertension       Objective:     BP 126/68   Pulse 84   Temp 98.4 F (36.9 C) (Oral)   Ht 5' 8 (1.727 m)   Wt 173 lb (78.5 kg)   SpO2 93% Comment: room air  BMI 26.30 kg/m   SpO2: 93 % (room air) RA   HEENT : Oropharynx  clear  Nasal turbinates nl    NECK :  without  apparent JVD/ palpable Nodes/TM    LUNGS: no acc muscle use,  Mild barrel  contour chest wall with bilateral  Distant bs s audible wheeze and  without cough on insp or exp maneuvers  and mild  Hyperresonant  to  percussion bilaterally     CV:  RRR  no s3 or murmur or increase in P2, and no edema   ABD:  soft and nontender   MS:  Nl gait/ ext warm without deformities Or obvious joint restrictions  calf tenderness, cyanosis or clubbing     SKIN: warm and dry without lesions    NEURO:  alert, approp, nl sensorium with  no motor or cerebellar deficits apparent.    CXR PA and Lateral:   03/22/2024 :    I personally reviewed images and impression is as follows:       C/w mild copd  no acute findings  Assessment     Assessment & Plan Chronic obstructive pulmonary disease with emphysema, unspecified emphysema type (HCC)  Former smoker  Screening for lung cancer  SOB (shortness of breath)  Coronary artery calcification seen on computed tomography     AVS  Patient Instructions  It was a pleasure to see you today. Your will receive a call for your CT chest scheduling. Cardiology referral has been placed, call us  if you dont get an appointment call in 2-3 weeks Start daliresp  250mcg daily for 4 weeks, after that take 500mcg daily Stop this medication or alert us  if you notice mood changes, depression or suicidal thoughts. It may also cause GI symptoms and weight loss. Continue breztri  2 puff BID Take albuterol  inhaler and nebulizer as needed    [name],  MD 03/22/2024      New Patient Pulmonology Office Visit   Subjective:  Patient ID: Richard Gates, male    DOB: Jun 21, 1960  MRN: [id number]  Referred by: Richard Aloysius BRAVO, MD  CC:  Chief Complaint  Patient presents with   Shortness of Breath    SOB with exertion persistent.    Shortness of Breath   LUCIUS WISE is a 63 y.o. male who is transferring care to me from Dr Darlean due to availabilities only on Fridays.  Discussed the use of AI scribe software for clinical note transcription with the patient, who gave verbal consent to proceed.  History of Present Illness ERMAL HABERER is a 63 year old male with COPD who presents with worsening shortness of breath.  He experiences worsening shortness of breath, particularly during physical exertion such as moving materials at work or walking long distances. He has to stop and rest to catch his breath after such activities. He describes his breathing as feeling 'tight' and has noticed a progressive decline in his ability to perform physical tasks without becoming breathless.  He has a history of COPD and recently underwent a breathing test about a week ago. It shows severe obstruction and reduced DLCO. He quit smoking in 2015, he thinks. He has been using Breztri  inhaler, two puffs in the morning and two at night, for about two years, which has helped reduce his shortness of breath compared to before he started the medication. He does not have a nebulizer at home but has used his wife's nebulizer in the past. He does not frequently use albuterol  and does not experience frequent bronchitis.  His past medical history includes a CT scan in January 2024, which showed emphysema and stable nodules. He is due for another CT scan as part of routine cancer screening for former smokers. He is unsure if he has had his heart checked before.  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 He lives with his wife in [place].   {PULM QUESTIONNAIRES (Optional):33196}  Review of Systems  Respiratory:  Positive for shortness of breath.    Review of symptoms negative except mentioned above   Allergies: Patient has no known allergies.  Current Outpatient Medications:    allopurinol  (ZYLOPRIM ) 300 MG tablet, Take 1 tablet (300 mg total) by mouth daily., Disp: 90 tablet, Rfl: 1   amLODipine  (NORVASC ) 5 MG tablet, Take 1 tablet (5 mg total) by mouth daily., Disp: 90 tablet, Rfl: 1   bisoprolol  (ZEBETA ) 5 MG tablet, Take 1 tablet (5 mg total) by mouth daily., Disp: 30 tablet, Rfl: 6   cholecalciferol  (VITAMIN D3) 25 MCG (1000 UNIT) tablet, Take 2,000 Units by mouth daily., Disp: , Rfl:    Coenzyme Q10 (COQ-10) 200 MG CAPS, Take 200 mg by mouth daily., Disp: , Rfl:    colchicine  0.6 MG tablet, Take 1 tablet (0.6 mg total) by mouth 2 (two) times daily as needed (gout flare)., Disp: 60 tablet, Rfl: 1   cyanocobalamin  (VITAMIN B12) 1000 MCG tablet, Take 1,000 mcg by mouth daily., Disp: , Rfl:    ipratropium-albuterol  (DUONEB) 0.5-2.5 (3) MG/3ML SOLN, Take 3 mLs by nebulization every 6 (six) hours as needed., Disp: 360 mL, Rfl: 1   potassium chloride  (KLOR-CON  M) 10 MEQ tablet, Take 1 tablet (10 mEq total) by mouth daily., Disp: 10 tablet, Rfl: 0   Roflumilast  (DALIRESP ) 250 MCG TABS, Take 250 mcg by mouth daily. Take this for first 4 weeks, then take the 500mcg tablets, Disp: 84 tablet, Rfl: 0   roflumilast  (DALIRESP ) 500 MCG TABS tablet, Take 1 tablet (500 mcg total) by mouth daily. Take 250mcg tablets the first 4 weeks, Disp: 90 tablet, Rfl: 5   rosuvastatin  (CRESTOR ) 20 MG tablet, Take 1 tablet (20 mg total) by mouth at bedtime., Disp: 90 tablet, Rfl: 1   Vitamin D , Ergocalciferol , (DRISDOL ) 1.25 MG (50000 UNIT) CAPS capsule, Take 1 capsule (50,000 Units total) by mouth every 7 (seven) days., Disp: 12 capsule, Rfl: 0   albuterol  (VENTOLIN  HFA) 108 (  90 Base)  MCG/ACT inhaler, Inhale 2 puffs into the lungs every 6 (six) hours as needed for wheezing or shortness of breath., Disp: 18 g, Rfl: 5   budesonide -glycopyrrolate-formoterol  (BREZTRI  AEROSPHERE) 160-9-4.8 MCG/ACT AERO inhaler, Inhale 2 puffs into the lungs in the morning and at bedtime., Disp: 10.7 g, Rfl: 5 Past Medical History:  Diagnosis Date   COPD (chronic obstructive pulmonary disease) (HCC)    Coronary artery calcification seen on CAT scan 08/2019   Coronary calcium  score 103; short LM with<25% mixed calcific plaque (minimal); aortic atherosclerosis with normal size.  No dissection.  No aortic valve calcification   GERD (gastroesophageal reflux disease)    Hyperlipidemia    Hypertension    Past Surgical History:  Procedure Laterality Date   APPENDECTOMY     BIOPSY  10/02/2021   Procedure: BIOPSY;  Surgeon: Albertus Gordy HERO, MD;  Location: [facility] ENDOSCOPY;  Service: Gastroenterology;;   COLONOSCOPY  08/24/2016   COLONOSCOPY WITH PROPOFOL  N/A 10/02/2021   Procedure: COLONOSCOPY WITH PROPOFOL ;  Surgeon: Albertus Gordy HERO, MD;  Location: [facility] ENDOSCOPY;  Service: Gastroenterology;  Laterality: N/A;   ESOPHAGOGASTRODUODENOSCOPY (EGD) WITH PROPOFOL  N/A 10/02/2021   Procedure: ESOPHAGOGASTRODUODENOSCOPY (EGD) WITH PROPOFOL ;  Surgeon: Albertus Gordy HERO, MD;  Location: [facility] ENDOSCOPY;  Service: Gastroenterology;  Laterality: N/A;   POLYPECTOMY     TRANSTHORACIC ECHOCARDIOGRAM  04/2018   EF 65-70% (vigorous). No RWMA.  Gr 1 DD.  Normal valves.  (In setting of COPD exacerbation)   Family History  Problem Relation Age of Onset   Stroke Father        July 2019: complicated by hemmrhage -- lost eyesight, memory loss   Hypertension Other    Heart disease Paternal Uncle    Breast cancer Sister    Heart disease Paternal Aunt        surgery   Heart attack Maternal Uncle    Colon cancer Neg Hx    Prostate cancer Neg Hx    Colon polyps Neg Hx    Esophageal cancer Neg Hx    Stomach cancer Neg Hx    Rectal cancer  Neg Hx    Social History   Socioeconomic History   Marital status: Married    Spouse name: Not on file   Number of children: 1   Years of education: Not on file   Highest education level: GED or equivalent  Occupational History   Occupation: works at [employer] in a local company  Tobacco Use   Smoking status: Former    Current packs/day: 0.00    Average packs/day: 0.8 packs/day for 41.0 years (30.8 ttl pk-yrs)    Types: Cigarettes    Start date: 62    Quit date: 2020    Years since quitting: 5.8   Smokeless tobacco: Never   Tobacco comments:    Quit tobacco 2020  Vaping Use   Vaping status: Never Used  Substance and Sexual Activity   Alcohol use: Yes    Comment: drinks Mike's Lemonade, week ends   Drug use: Not Currently    Types: Marijuana    Comment: last use in maybe 2018   Sexual activity: Not on file  Other Topics Concern   Not on file  Social History Narrative   Household- pt and wife   1 daughter    4 g-children   2 g-g child   Social Drivers of Corporate Investment Banker Strain: Medium Risk (02/19/2024)   Overall Financial Resource Strain (CARDIA)  Difficulty of Paying Living Expenses: Somewhat hard  Food Insecurity: Food Insecurity Present (02/19/2024)   Hunger Vital Sign    Worried About Running Out of Food in the Last Year: Sometimes true    Ran Out of Food in the Last Year: Never true  Transportation Needs: No Transportation Needs (02/19/2024)   PRAPARE - Administrator, Civil Service (Medical): No    Lack of Transportation (Non-Medical): No  Physical Activity: Inactive (02/19/2024)   Exercise Vital Sign    Days of Exercise per Week: 0 days    Minutes of Exercise per Session: Not on file  Stress: Stress Concern Present (02/19/2024)   [employer] of Occupational Health - Occupational Stress Questionnaire    Feeling of Stress: Rather much  Social Connections: Moderately Integrated (02/19/2024)   Social Connection and  Isolation Panel    Frequency of Communication with Friends and Family: Three times a week    Frequency of Social Gatherings with Friends and Family: Once a week    Attends Religious Services: More than 4 times per year    Active Member of [employer] or Organizations: No    Attends Engineer, Structural: Not on file    Marital Status: Married  Catering Manager Violence: Not on file         Objective:  BP 126/68   Pulse 84   Temp 98.4 F (36.9 C) (Oral)   Ht 5' 8 (1.727 m)   Wt 173 lb (78.5 kg)   SpO2 93% Comment: room air  BMI 26.30 kg/m  {Pulm Vitals (Optional):32837}  Physical Exam Constitutional:      General: He is not in acute distress.    Appearance: Normal appearance.  HENT:     Mouth/Throat:     Mouth: Mucous membranes are moist.  Cardiovascular:     Rate and Rhythm: Normal rate.  Pulmonary:     Effort: No respiratory distress.     Breath sounds: No wheezing or rales.  Musculoskeletal:     Right lower leg: No edema.     Left lower leg: No edema.  Skin:    General: Skin is warm.  Neurological:     Mental Status: He is alert and oriented to person, place, and time.  Psychiatric:        Mood and Affect: Mood normal.     Diagnostic Review:  {Labs (Optional):32838}  Pft    Latest Ref Rng & Units 03/08/2024   10:47 AM  PFT Results  FVC-Pre L 2.37   FVC-Predicted Pre % 56   FVC-Post L 2.47   FVC-Predicted Post % 59   Pre FEV1/FVC % % 55   Post FEV1/FCV % % 54   FEV1-Pre L 1.31   FEV1-Predicted Pre % 41   FEV1-Post L 1.34   DLCO uncorrected ml/min/mmHg 13.96   DLCO UNC% % 56   DLCO corrected ml/min/mmHg 14.87   DLCO COR %Predicted % 60   DLVA Predicted % 83   TLC L 5.86   TLC % Predicted % 91   RV % Predicted % 142          Results RADIOLOGY Chest CT: Emphysema in upper lobes, coronary artery calcification (05/2022) Chest X-ray: Clear lungs, no concerning lesions (02/27/2024)  DIAGNOSTIC Pulmonary function test: Severe obstruction  consistent with COPD, reduced DLCO      Assessment & Plan:   Assessment & Plan Chronic obstructive pulmonary disease with emphysema, unspecified emphysema type (HCC) Uncontrolled symptoms despite being on  triple inhaler Will add duonebs. Pt will clean and use his wife's nebulizer- she wont be using it anymore Advised him to use albuterol  before exertion Discussed inhaled phosphodiesterase inhibitor- ensifentrine . However due to potential high cost he prefers to use daliresp  Discussed mood changes, suicidal ideation, gi A/E and weight loss with the patient He will take daliresp  250mcg daily for first 4 weeks, then increase to 500mcg daily     Former smoker Congratulated on quitting smoking     Screening for lung cancer I discussed US  Biochemist, Clinical for annual low-dose CT scan screening for high-risk individuals (ages 46 to 25 years with a 20 pack-year history of smoking and current smoker or quit within past 15 years).  I made them aware that if abnormal CT chest finding may lead to further diagnostic work-up including but not limited to regular follow-up CT chest, lung biopsies with their inherent risks for pneumothorax and bleeding and personal stress from finding out about lung nodule/other abnormalities.  The idea is to help diagnose lung malignancy at an early stage and this has shown to lower the number of lung cancer related deaths.  We also discussed that screening CT chest results may warrant close follow-up and interventions on nodules that may eventually turn out to be nonmalignant. Pt/family understand the risk and would like to proceed with lung cancer screening.   [place]    SOB (shortness of breath) Significant dyspnea despite triple inhaler, denies frequent bronchitis flare ups Will refer to cardiology, coronary calcifications in CT chest      Thank you for the opportunity to take part in the care of DAMEER SPEISER   Return in about 3 months (around 06/22/2024).   Ragina Fenter Pleas, MD [facility] Office: [phone]

## 2024-03-25 NOTE — Addendum Note (Signed)
 Addended by: Joany Khatib on: 03/25/2024 10:39 AM   Modules accepted: Level of Service

## 2024-04-05 ENCOUNTER — Ambulatory Visit (HOSPITAL_BASED_OUTPATIENT_CLINIC_OR_DEPARTMENT_OTHER): Admission: RE | Admit: 2024-04-05 | Discharge: 2024-04-05 | Disposition: A | Source: Ambulatory Visit

## 2024-04-05 DIAGNOSIS — Z122 Encounter for screening for malignant neoplasm of respiratory organs: Secondary | ICD-10-CM

## 2024-04-05 DIAGNOSIS — R310 Gross hematuria: Secondary | ICD-10-CM

## 2024-04-05 DIAGNOSIS — Z87891 Personal history of nicotine dependence: Secondary | ICD-10-CM

## 2024-04-05 MED ORDER — IOHEXOL 300 MG/ML  SOLN
100.0000 mL | Freq: Once | INTRAMUSCULAR | Status: AC | PRN
  Administered 2024-04-05: 100 mL via INTRAVENOUS

## 2024-04-05 NOTE — Telephone Encounter (Signed)
 Copied from CRM [id number]. Topic: Clinical - Lab/Test Results >> Apr 02, 2024  4:50 PM Lavanda D wrote: Reason for CRM: Patient returned a call from the clinic - he stated that it was regarding his recent chest x-ray. Please call to advise on results. LVM if no answer.  Relayed results of cxr and pt confirmed understanding

## 2024-04-09 ENCOUNTER — Ambulatory Visit: Payer: Self-pay | Admitting: Urology

## 2024-04-10 ENCOUNTER — Telehealth: Payer: Self-pay

## 2024-04-10 NOTE — Telephone Encounter (Signed)
 Prior authorization received from cover my meds regarding Roflumilast  500 mcg tabs  Key- [id number]  Last name- Vankirk  Provider- Dr Pleas

## 2024-04-11 ENCOUNTER — Ambulatory Visit: Payer: Self-pay

## 2024-04-11 ENCOUNTER — Telehealth: Payer: Self-pay

## 2024-04-11 ENCOUNTER — Other Ambulatory Visit (HOSPITAL_COMMUNITY): Payer: Self-pay

## 2024-04-11 NOTE — Telephone Encounter (Signed)
*  Pulm  Pharmacy Patient Advocate Encounter   Received notification from Pt Calls Messages that prior authorization for Roflumilast  tablets is required/requested.   Insurance verification completed.   The patient is insured through [facility].   Per test claim: PA required; PA submitted to above mentioned insurance via Latent Key/confirmation #/EOC [id number] Status is pending

## 2024-04-16 NOTE — Telephone Encounter (Signed)
 Your request has been approved PA Case: [id number], Status: Approved, Coverage Starts on: 04/16/2024 12:00:00 AM, Coverage Ends on: 04/16/2025 12:00:00 AM. Authorization Expiration12/16/2026

## 2024-04-16 NOTE — Telephone Encounter (Signed)
ATC x 1 

## 2024-04-17 ENCOUNTER — Ambulatory Visit: Payer: Self-pay

## 2024-04-17 DIAGNOSIS — J439 Emphysema, unspecified: Secondary | ICD-10-CM

## 2024-04-17 NOTE — Telephone Encounter (Signed)
 FYI Only or Action Required?: Action required by provider: request for appointment and update on patient condition.  Patient is followed in Pulmonology for COPD, last seen on 03/22/2024 by Pleas Newborn, MD.  Called Nurse Triage reporting Shortness of Breath.  Symptoms began several months ago.  Interventions attempted: Rescue inhaler and Maintenance inhaler.  Symptoms are: gradually worsening.  Triage Disposition: See HCP Within 4 Hours (Or PCP Triage)  Patient/caregiver understands and will follow disposition?: Unsure   Copied from CRM #[id number]. Topic: Clinical - Red Word Triage >> Apr 17, 2024 12:48 PM Devaughn RAMAN wrote: Red Word that prompted transfer to Nurse Triage: Pt stated he is short of breath and wheezing Reason for Disposition  [1] MILD difficulty breathing (e.g., minimal/no SOB at rest, SOB with walking, pulse < 100) AND [2] NEW-onset or WORSE than normal  Answer Assessment - Initial Assessment Questions Pt is using medications as prescribed, pt feels symptoms are worsening over the last month.   1. RESPIRATORY STATUS: Describe your breathing? (e.g., wheezing, shortness of breath, unable to speak, severe coughing)      SOB getting worse and worse 2. ONSET: When did this breathing problem begin?      SOB worsened a couple of months ago 3. PATTERN Does the difficult breathing come and go, or has it been constant since it started?      Lately it's been pretty constant per patient, worse with ambulation 4. SEVERITY: How bad is your breathing? (e.g., mild, moderate, severe)      Pt reports SOB affecting my job and things... have to sit down and catch my breath. 5. RECURRENT SYMPTOM: Have you had difficulty breathing before? If Yes, ask: When was the last time? and What happened that time?      Yes, a few weeks ago 6. CARDIAC HISTORY: Do you have any history of heart disease? (e.g., heart attack, angina, bypass surgery, angioplasty)      Denies 7. LUNG  HISTORY: Do you have any history of lung disease?  (e.g., pulmonary embolus, asthma, emphysema)     COPD, emphysema 8. CAUSE: What do you think is causing the breathing problem?      COPD/emphysema 9. OTHER SYMPTOMS: Do you have any other symptoms? (e.g., chest pain, cough, dizziness, fever, runny nose)     Denies CP, reports constant cough, denies fever 10. O2 SATURATION MONITOR:  Do you use an oxygen saturation monitor (pulse oximeter) at home? If Yes, ask: What is your reading (oxygen level) today? What is your usual oxygen saturation reading? (e.g., 95%)       Denies having a pulse-oximeter at home  12. TRAVEL: Have you traveled out of the country in the last month? (e.g., travel history, exposures)       Denies  Dx with COPD, emphysema  Protocols used: Breathing Difficulty-A-AH

## 2024-04-17 NOTE — Telephone Encounter (Signed)
 Plenty of acute openings tomorrow  I called pt and there was no answer- LMTCB.

## 2024-04-18 MED ORDER — PREDNISONE 20 MG PO TABS: 40.0000 mg | ORAL_TABLET | Freq: Every day | ORAL | 0 refills | Status: AC

## 2024-04-18 NOTE — Telephone Encounter (Signed)
 Please make sure he is using breztri , daliresp , duonebs and as needed albuterol . If he can do video visit, would prefer that. If he can't please send 5 days of prednisone  40 mg and azithromycin  500 for 5 days.  Let's get him in January instead of February to discuss other options for copd maintenance   If he has chest pain, dizziness, nausea, could be heart related and needs ED visit. [place] we can treat for COPD now and he can update us  next week. If symptoms worsen over weekend, needs to go to urgent care/ED

## 2024-04-18 NOTE — Telephone Encounter (Signed)
 I called and spoke with patient and offered a video visit, however he said he was still at work and would not be able to do it.  I provided the information per Dr. Kent.  He said he is using the Breztri  inhaler and the albuterol    I verified his pharmacy and sent prescriptions in for him.  He stated he did not have a nebulizer machine so I sent in an order for that as well.  I changed his follow up appointment from 2/6 to 1/13.  He verbalized understanding.  Nothing further needed.

## 2024-04-18 NOTE — Telephone Encounter (Signed)
 I called and spoke with patient, he states he works 10 hour shifts Monday through Through Thursday.  He is in [place] working today and is not able to come in to the office for a visit.l  He said by the time he got off, we would be closed.  He was last seen in November by Dr. Pleas.  I let him know I would send a message to her to see what she recommends and then we will call him back with recommendations.  He verbalized understanding.  Dr. Pleas, Please advise.  Thank you.

## 2024-04-19 ENCOUNTER — Other Ambulatory Visit: Payer: Self-pay | Admitting: Internal Medicine

## 2024-05-03 ENCOUNTER — Other Ambulatory Visit: Admitting: Urology

## 2024-05-14 ENCOUNTER — Ambulatory Visit

## 2024-05-31 ENCOUNTER — Ambulatory Visit: Admitting: Pulmonary Disease

## 2024-06-07 ENCOUNTER — Ambulatory Visit

## 2024-06-24 ENCOUNTER — Ambulatory Visit: Admitting: Pulmonary Disease

## 2024-06-28 ENCOUNTER — Ambulatory Visit: Admitting: Internal Medicine
